# Patient Record
Sex: Female | Born: 1945 | Race: White | Hispanic: No | Smoking: Never smoker
Health system: Southern US, Community
[De-identification: ages and names within clinical notes are randomized; demographics above are authoritative.]

## PROBLEM LIST (undated history)

## (undated) DIAGNOSIS — J45909 Unspecified asthma, uncomplicated: Secondary | ICD-10-CM

## (undated) DIAGNOSIS — T7840XA Allergy, unspecified, initial encounter: Secondary | ICD-10-CM

## (undated) DIAGNOSIS — K219 Gastro-esophageal reflux disease without esophagitis: Secondary | ICD-10-CM

## (undated) DIAGNOSIS — H409 Unspecified glaucoma: Secondary | ICD-10-CM

## (undated) DIAGNOSIS — M069 Rheumatoid arthritis, unspecified: Secondary | ICD-10-CM

## (undated) DIAGNOSIS — F319 Bipolar disorder, unspecified: Secondary | ICD-10-CM

## (undated) DIAGNOSIS — K5792 Diverticulitis of intestine, part unspecified, without perforation or abscess without bleeding: Secondary | ICD-10-CM

## (undated) DIAGNOSIS — F99 Mental disorder, not otherwise specified: Secondary | ICD-10-CM

## (undated) DIAGNOSIS — E785 Hyperlipidemia, unspecified: Secondary | ICD-10-CM

## (undated) DIAGNOSIS — I10 Essential (primary) hypertension: Secondary | ICD-10-CM

## (undated) DIAGNOSIS — M8580 Other specified disorders of bone density and structure, unspecified site: Secondary | ICD-10-CM

## (undated) DIAGNOSIS — T884XXA Failed or difficult intubation, initial encounter: Secondary | ICD-10-CM

## (undated) DIAGNOSIS — G8929 Other chronic pain: Secondary | ICD-10-CM

## (undated) DIAGNOSIS — M549 Dorsalgia, unspecified: Secondary | ICD-10-CM

## (undated) HISTORY — DX: Allergy, unspecified, initial encounter: T78.40XA

## (undated) HISTORY — DX: Dorsalgia, unspecified: M54.9

## (undated) HISTORY — DX: Mental disorder, not otherwise specified: F99

## (undated) HISTORY — PX: HAND SURGERY: SHX662

## (undated) HISTORY — DX: Other chronic pain: G89.29

## (undated) HISTORY — PX: CHOLECYSTECTOMY: SHX55

## (undated) HISTORY — DX: Other specified disorders of bone density and structure, unspecified site: M85.80

## (undated) HISTORY — DX: Hyperlipidemia, unspecified: E78.5

## (undated) HISTORY — PX: COLONOSCOPY: SHX174

## (undated) HISTORY — DX: Rheumatoid arthritis, unspecified: M06.9

## (undated) HISTORY — PX: APPENDECTOMY: SHX54

## (undated) HISTORY — DX: Diverticulitis of intestine, part unspecified, without perforation or abscess without bleeding: K57.92

## (undated) HISTORY — DX: Bipolar disorder, unspecified: F31.9

## (undated) HISTORY — DX: Unspecified glaucoma: H40.9

## (undated) HISTORY — PX: BREAST CYST ASPIRATION: SHX578

---

## 1986-09-11 HISTORY — PX: BACK SURGERY: SHX140

## 1998-03-22 ENCOUNTER — Encounter: Payer: Self-pay | Admitting: Internal Medicine

## 2001-10-07 ENCOUNTER — Other Ambulatory Visit: Admission: RE | Admit: 2001-10-07 | Discharge: 2001-10-07 | Payer: Self-pay | Admitting: Internal Medicine

## 2001-10-07 ENCOUNTER — Encounter: Payer: Self-pay | Admitting: Internal Medicine

## 2001-10-07 LAB — CONVERTED CEMR LAB: Pap Smear: NORMAL

## 2003-12-24 ENCOUNTER — Encounter: Payer: Self-pay | Admitting: Internal Medicine

## 2003-12-24 ENCOUNTER — Other Ambulatory Visit: Admission: RE | Admit: 2003-12-24 | Discharge: 2003-12-24 | Payer: Self-pay | Admitting: Internal Medicine

## 2003-12-24 LAB — CONVERTED CEMR LAB: Pap Smear: NORMAL

## 2004-06-11 ENCOUNTER — Ambulatory Visit: Payer: Self-pay | Admitting: Internal Medicine

## 2004-07-12 ENCOUNTER — Ambulatory Visit: Payer: Self-pay | Admitting: Internal Medicine

## 2004-08-11 ENCOUNTER — Ambulatory Visit: Payer: Self-pay | Admitting: Internal Medicine

## 2004-08-24 ENCOUNTER — Ambulatory Visit: Payer: Self-pay | Admitting: Internal Medicine

## 2005-02-13 ENCOUNTER — Encounter: Admission: RE | Admit: 2005-02-13 | Discharge: 2005-02-13 | Payer: Self-pay | Admitting: Neurological Surgery

## 2005-03-03 ENCOUNTER — Encounter: Payer: Self-pay | Admitting: Internal Medicine

## 2005-03-17 ENCOUNTER — Ambulatory Visit: Payer: Self-pay | Admitting: Internal Medicine

## 2005-04-18 ENCOUNTER — Ambulatory Visit: Payer: Self-pay | Admitting: Internal Medicine

## 2005-07-20 ENCOUNTER — Ambulatory Visit: Payer: Self-pay | Admitting: Internal Medicine

## 2005-10-16 ENCOUNTER — Ambulatory Visit: Payer: Self-pay | Admitting: Internal Medicine

## 2005-11-21 ENCOUNTER — Ambulatory Visit: Payer: Self-pay | Admitting: Internal Medicine

## 2006-04-06 ENCOUNTER — Ambulatory Visit: Payer: Self-pay | Admitting: Internal Medicine

## 2006-08-09 ENCOUNTER — Ambulatory Visit: Payer: Self-pay | Admitting: Internal Medicine

## 2007-01-08 ENCOUNTER — Encounter: Payer: Self-pay | Admitting: Internal Medicine

## 2007-01-08 DIAGNOSIS — M549 Dorsalgia, unspecified: Secondary | ICD-10-CM

## 2007-01-08 DIAGNOSIS — G8929 Other chronic pain: Secondary | ICD-10-CM

## 2007-01-08 DIAGNOSIS — K5730 Diverticulosis of large intestine without perforation or abscess without bleeding: Secondary | ICD-10-CM | POA: Insufficient documentation

## 2007-01-22 ENCOUNTER — Ambulatory Visit: Payer: Self-pay | Admitting: Internal Medicine

## 2007-01-22 DIAGNOSIS — R030 Elevated blood-pressure reading, without diagnosis of hypertension: Secondary | ICD-10-CM

## 2007-01-22 DIAGNOSIS — E785 Hyperlipidemia, unspecified: Secondary | ICD-10-CM | POA: Insufficient documentation

## 2007-01-23 LAB — CONVERTED CEMR LAB
AST: 18 units/L (ref 0–37)
BUN: 18 mg/dL (ref 6–23)
Basophils Absolute: 0 10*3/uL (ref 0.0–0.1)
Basophils Relative: 0.1 % (ref 0.0–1.0)
Bilirubin, Direct: 0.1 mg/dL (ref 0.0–0.3)
Chloride: 104 meq/L (ref 96–112)
Creatinine, Ser: 0.5 mg/dL (ref 0.4–1.2)
Creatinine,U: 63.5 mg/dL
Eosinophils Relative: 3.2 % (ref 0.0–5.0)
GFR calc Af Amer: 161 mL/min
GFR calc non Af Amer: 133 mL/min
Glucose, Bld: 116 mg/dL — ABNORMAL HIGH (ref 70–99)
HCT: 36 % (ref 36.0–46.0)
Hgb A1c MFr Bld: 5.7 % (ref 4.6–6.0)
Microalb Creat Ratio: 18.9 mg/g (ref 0.0–30.0)
Microalb, Ur: 1.2 mg/dL (ref 0.0–1.9)
Monocytes Absolute: 0.3 10*3/uL (ref 0.2–0.7)
Neutro Abs: 2.9 10*3/uL (ref 1.4–7.7)
Neutrophils Relative %: 56.1 % (ref 43.0–77.0)
Phosphorus: 3.9 mg/dL (ref 2.3–4.6)
Platelets: 341 10*3/uL (ref 150–400)
Potassium: 4.3 meq/L (ref 3.5–5.1)
TSH: 2.2 microintl units/mL (ref 0.35–5.50)

## 2007-02-08 ENCOUNTER — Encounter: Payer: Self-pay | Admitting: Internal Medicine

## 2007-02-08 ENCOUNTER — Ambulatory Visit: Payer: Self-pay | Admitting: Gastroenterology

## 2007-02-08 LAB — HM COLONOSCOPY

## 2007-02-15 ENCOUNTER — Encounter: Payer: Self-pay | Admitting: Internal Medicine

## 2007-05-14 ENCOUNTER — Telehealth (INDEPENDENT_AMBULATORY_CARE_PROVIDER_SITE_OTHER): Payer: Self-pay | Admitting: *Deleted

## 2007-05-17 ENCOUNTER — Encounter: Payer: Self-pay | Admitting: Internal Medicine

## 2007-05-17 ENCOUNTER — Ambulatory Visit: Payer: Self-pay | Admitting: Unknown Physician Specialty

## 2007-05-27 ENCOUNTER — Encounter: Payer: Self-pay | Admitting: Internal Medicine

## 2007-06-19 ENCOUNTER — Telehealth (INDEPENDENT_AMBULATORY_CARE_PROVIDER_SITE_OTHER): Payer: Self-pay | Admitting: *Deleted

## 2007-07-26 ENCOUNTER — Ambulatory Visit: Payer: Self-pay | Admitting: Internal Medicine

## 2007-07-26 DIAGNOSIS — M949 Disorder of cartilage, unspecified: Secondary | ICD-10-CM

## 2007-07-26 DIAGNOSIS — M899 Disorder of bone, unspecified: Secondary | ICD-10-CM | POA: Insufficient documentation

## 2007-07-29 LAB — CONVERTED CEMR LAB
ALT: 15 units/L (ref 0–35)
CO2: 30 meq/L (ref 19–32)
Cholesterol: 207 mg/dL (ref 0–200)
GFR calc Af Amer: 130 mL/min
GFR calc non Af Amer: 108 mL/min
Glucose, Bld: 102 mg/dL — ABNORMAL HIGH (ref 70–99)
HDL: 41 mg/dL (ref >39.0)
Hgb A1c MFr Bld: 5.9 % (ref 4.6–6.0)
Total CHOL/HDL Ratio: 5
Triglycerides: 125 mg/dL (ref 0–149)

## 2007-09-19 ENCOUNTER — Ambulatory Visit: Payer: Self-pay | Admitting: Internal Medicine

## 2007-09-19 LAB — CONVERTED CEMR LAB
Bilirubin Urine: NEGATIVE
Glucose, Urine, Semiquant: NEGATIVE
Ketones, urine, test strip: NEGATIVE
Nitrite: NEGATIVE
Specific Gravity, Urine: <1.005
Urobilinogen, UA: 0.2
pH: 8.5

## 2007-10-29 ENCOUNTER — Encounter: Payer: Self-pay | Admitting: Internal Medicine

## 2007-10-29 ENCOUNTER — Telehealth (INDEPENDENT_AMBULATORY_CARE_PROVIDER_SITE_OTHER): Payer: Self-pay | Admitting: *Deleted

## 2007-10-31 ENCOUNTER — Encounter: Payer: Self-pay | Admitting: Internal Medicine

## 2007-12-10 ENCOUNTER — Ambulatory Visit: Payer: Self-pay | Admitting: Internal Medicine

## 2008-03-24 ENCOUNTER — Ambulatory Visit: Payer: Self-pay | Admitting: Internal Medicine

## 2008-03-25 ENCOUNTER — Encounter: Payer: Self-pay | Admitting: Internal Medicine

## 2008-03-25 ENCOUNTER — Telehealth (INDEPENDENT_AMBULATORY_CARE_PROVIDER_SITE_OTHER): Payer: Self-pay | Admitting: *Deleted

## 2008-03-25 LAB — CONVERTED CEMR LAB
Albumin: 3.9 g/dL (ref 3.5–5.2)
BUN: 20 mg/dL (ref 6–23)
Bilirubin, Direct: 0.1 mg/dL (ref 0.0–0.3)
CO2: 29 meq/L (ref 19–32)
Cholesterol: 202 mg/dL (ref 0–200)
Creatinine, Ser: 0.6 mg/dL (ref 0.4–1.2)
Creatinine,U: 221.9 mg/dL
Direct LDL: 129.4 mg/dL
Eosinophils Relative: 1.5 % (ref 0.0–5.0)
GFR calc non Af Amer: 107 mL/min
Glucose, Bld: 129 mg/dL — ABNORMAL HIGH (ref 70–99)
HDL: 37.2 mg/dL — ABNORMAL LOW (ref >39.0)
Hemoglobin: 13.2 g/dL (ref 12.0–15.0)
MCHC: 34.9 g/dL (ref 30.0–36.0)
Monocytes Relative: 4.4 % (ref 3.0–12.0)
Phosphorus: 3.9 mg/dL (ref 2.3–4.6)
Potassium: 4.1 meq/L (ref 3.5–5.1)
RBC: 4.18 M/uL (ref 3.87–5.11)
Sodium: 142 meq/L (ref 135–145)
TSH: 2.02 microintl units/mL (ref 0.35–5.50)
Total Bilirubin: 0.5 mg/dL (ref 0.3–1.2)
Total CHOL/HDL Ratio: 5.4
Total Protein: 7.9 g/dL (ref 6.0–8.3)
VLDL: 52 mg/dL — ABNORMAL HIGH (ref 0–40)
WBC: 8.6 10*3/uL (ref 4.5–10.5)

## 2008-08-06 ENCOUNTER — Emergency Department: Payer: Self-pay | Admitting: Emergency Medicine

## 2008-10-01 ENCOUNTER — Ambulatory Visit: Payer: Self-pay | Admitting: Internal Medicine

## 2008-10-05 LAB — CONVERTED CEMR LAB
BUN: 18 mg/dL (ref 6–23)
Basophils Relative: 1.3 % (ref 0.0–3.0)
CO2: 28 meq/L (ref 19–32)
Calcium: 9.8 mg/dL (ref 8.4–10.5)
Chloride: 106 meq/L (ref 96–112)
Creatinine, Ser: 0.6 mg/dL (ref 0.4–1.2)
GFR calc Af Amer: 130 mL/min
GFR calc non Af Amer: 107 mL/min
Glucose, Bld: 109 mg/dL — ABNORMAL HIGH (ref 70–99)
HCT: 36.5 % (ref 36.0–46.0)
Hgb A1c MFr Bld: 6.3 % — ABNORMAL HIGH (ref 4.6–6.0)
MCV: 91 fL (ref 78.0–100.0)
Neutrophils Relative %: 60.1 % (ref 43.0–77.0)
Platelets: 277 10*3/uL (ref 150–400)
WBC: 7.9 10*3/uL (ref 4.5–10.5)

## 2008-10-13 ENCOUNTER — Telehealth: Payer: Self-pay | Admitting: Internal Medicine

## 2008-12-23 ENCOUNTER — Ambulatory Visit: Payer: Self-pay | Admitting: Internal Medicine

## 2008-12-24 ENCOUNTER — Encounter: Payer: Self-pay | Admitting: Internal Medicine

## 2008-12-29 ENCOUNTER — Encounter: Payer: Self-pay | Admitting: Internal Medicine

## 2009-03-31 ENCOUNTER — Ambulatory Visit: Payer: Self-pay | Admitting: Internal Medicine

## 2009-04-01 LAB — CONVERTED CEMR LAB
AST: 18 units/L (ref 0–37)
Albumin: 4 g/dL (ref 3.5–5.2)
Alkaline Phosphatase: 82 units/L (ref 39–117)
BUN: 14 mg/dL (ref 6–23)
Bilirubin, Direct: 0 mg/dL (ref 0.0–0.3)
Calcium: 9.6 mg/dL (ref 8.4–10.5)
Chloride: 105 meq/L (ref 96–112)
Creatinine, Ser: 0.6 mg/dL (ref 0.4–1.2)
Glucose, Bld: 111 mg/dL — ABNORMAL HIGH (ref 70–99)
HCT: 38.3 % (ref 36.0–46.0)
MCHC: 34 g/dL (ref 30.0–36.0)
Neutrophils Relative %: 62.9 % (ref 43.0–77.0)
Phosphorus: 3.8 mg/dL (ref 2.3–4.6)
Platelets: 325 10*3/uL (ref 150.0–400.0)
Potassium: 4.8 meq/L (ref 3.5–5.1)
RBC: 4.12 M/uL (ref 3.87–5.11)
Sodium: 142 meq/L (ref 135–145)
TSH: 1.37 microintl units/mL (ref 0.35–5.50)
Total Protein: 7.7 g/dL (ref 6.0–8.3)
WBC: 6.9 10*3/uL (ref 4.5–10.5)

## 2009-04-05 LAB — CONVERTED CEMR LAB: Creatinine,U: 214.4 mg/dL

## 2009-05-03 ENCOUNTER — Ambulatory Visit: Payer: Self-pay | Admitting: Internal Medicine

## 2009-05-04 LAB — CONVERTED CEMR LAB
Alkaline Phosphatase: 75 units/L (ref 39–117)
Cholesterol: 150 mg/dL (ref 0–200)
HDL: 41 mg/dL (ref >39.00)
Total Bilirubin: 0.6 mg/dL (ref 0.3–1.2)
Total CHOL/HDL Ratio: 4
Triglycerides: 122 mg/dL (ref 0.0–149.0)

## 2009-06-07 ENCOUNTER — Ambulatory Visit: Payer: Self-pay | Admitting: Internal Medicine

## 2009-06-07 DIAGNOSIS — R413 Other amnesia: Secondary | ICD-10-CM

## 2009-06-10 LAB — CONVERTED CEMR LAB
AST: 20 units/L (ref 0–37)
Albumin: 4 g/dL (ref 3.5–5.2)
BUN: 17 mg/dL (ref 6–23)
Basophils Relative: 0.9 % (ref 0.0–3.0)
CO2: 30 meq/L (ref 19–32)
Chloride: 107 meq/L (ref 96–112)
Eosinophils Relative: 4.2 % (ref 0.0–5.0)
Hemoglobin: 12.8 g/dL (ref 12.0–15.0)
Lymphs Abs: 2.1 10*3/uL (ref 0.7–4.0)
MCHC: 34.2 g/dL (ref 30.0–36.0)
MCV: 94.5 fL (ref 78.0–100.0)
Monocytes Absolute: 0.2 10*3/uL (ref 0.1–1.0)
Neutro Abs: 4.4 10*3/uL (ref 1.4–7.7)
Neutrophils Relative %: 62.2 % (ref 43.0–77.0)
Potassium: 4.8 meq/L (ref 3.5–5.1)
RBC: 3.97 M/uL (ref 3.87–5.11)
RDW: 12.7 % (ref 11.5–14.6)
Sodium: 140 meq/L (ref 135–145)
Total Bilirubin: 0.3 mg/dL (ref 0.3–1.2)

## 2009-07-01 ENCOUNTER — Ambulatory Visit: Payer: Self-pay | Admitting: Internal Medicine

## 2009-07-05 LAB — CONVERTED CEMR LAB: Vitamin B-12: >1500 pg/mL — ABNORMAL HIGH (ref 211–911)

## 2009-07-07 ENCOUNTER — Ambulatory Visit: Payer: Self-pay | Admitting: Internal Medicine

## 2009-10-01 ENCOUNTER — Ambulatory Visit: Payer: Self-pay | Admitting: Internal Medicine

## 2009-10-01 DIAGNOSIS — F3174 Bipolar disorder, in full remission, most recent episode manic: Secondary | ICD-10-CM

## 2009-10-04 LAB — CONVERTED CEMR LAB
Albumin: 3.7 g/dL (ref 3.5–5.2)
Alkaline Phosphatase: 87 units/L (ref 39–117)
BUN: 15 mg/dL (ref 6–23)
Bilirubin, Direct: 0 mg/dL (ref 0.0–0.3)
CO2: 30 meq/L (ref 19–32)
Creatinine, Ser: 0.6 mg/dL (ref 0.4–1.2)
Eosinophils Absolute: 0.1 10*3/uL (ref 0.0–0.7)
GFR calc non Af Amer: 106.77 mL/min (ref >60)
HCT: 34.5 % — ABNORMAL LOW (ref 36.0–46.0)
Hemoglobin: 11.4 g/dL — ABNORMAL LOW (ref 12.0–15.0)
Monocytes Absolute: 0.3 10*3/uL (ref 0.1–1.0)
Phosphorus: 4.1 mg/dL (ref 2.3–4.6)
Platelets: 321 10*3/uL (ref 150.0–400.0)
Potassium: 4.4 meq/L (ref 3.5–5.1)
RBC: 3.6 M/uL — ABNORMAL LOW (ref 3.87–5.11)
RDW: 12.8 % (ref 11.5–14.6)
Total Bilirubin: 0.6 mg/dL (ref 0.3–1.2)
Total Protein: 7.1 g/dL (ref 6.0–8.3)
Vitamin B-12: >1500 pg/mL — ABNORMAL HIGH (ref 211–911)
WBC: 6.4 10*3/uL (ref 4.5–10.5)

## 2010-03-15 ENCOUNTER — Ambulatory Visit: Payer: Self-pay | Admitting: Family Medicine

## 2010-03-15 DIAGNOSIS — B029 Zoster without complications: Secondary | ICD-10-CM | POA: Insufficient documentation

## 2010-04-05 ENCOUNTER — Ambulatory Visit: Payer: Self-pay | Admitting: Internal Medicine

## 2010-04-07 LAB — CONVERTED CEMR LAB
ALT: 12 units/L (ref 0–35)
AST: 15 units/L (ref 0–37)
Albumin: 4 g/dL (ref 3.5–5.2)
Alkaline Phosphatase: 82 units/L (ref 39–117)
BUN: 19 mg/dL (ref 6–23)
Basophils Absolute: 0 10*3/uL (ref 0.0–0.1)
Basophils Relative: 0.4 % (ref 0.0–3.0)
CO2: 29 meq/L (ref 19–32)
Creatinine,U: 83.5 mg/dL
Direct LDL: 127.5 mg/dL
Eosinophils Relative: 3.5 % (ref 0.0–5.0)
Hemoglobin: 11.9 g/dL — ABNORMAL LOW (ref 12.0–15.0)
Lymphocytes Relative: 30.8 % (ref 12.0–46.0)
MCHC: 34.3 g/dL (ref 30.0–36.0)
Microalb Creat Ratio: 3.2 mg/g (ref 0.0–30.0)
Potassium: 4.5 meq/L (ref 3.5–5.1)
Sodium: 141 meq/L (ref 135–145)
TSH: 2.09 microintl units/mL (ref 0.35–5.50)
Total CHOL/HDL Ratio: 4
VLDL: 32.2 mg/dL (ref 0.0–40.0)

## 2010-10-11 NOTE — Assessment & Plan Note (Signed)
Summary: RASH/DLO   Vital Signs:  Patient profile:   65 year old female Height:      63 inches Weight:      131.0 pounds BMI:     23.29 Temp:     99.9 degrees F oral Pulse rate:   88 / minute Pulse rhythm:   regular BP sitting:   102 / 68  (left arm) Cuff size:   regular  Vitals Entered By: Benny Lennert CMA Duncan Dull) (March 15, 2010 3:35 PM)  History of Present Illness: Chief complaint rash    6 days ago... Noted itching in righ lower back.. then pain began. Tried cortisone cream, bath powder, neosporim. Area spreading locally on right side. Very sensitive to touch..trouble sleeping du to pain.  No sick contacts, no fever, no sick feeling, no new meds.   No OTC meds for pain.   Problems Prior to Update: 1)  Bipolar Disorder Unspecified  (ICD-296.80) 2)  Memory Loss  (ICD-780.93) 3)  Other Screening Mammogram  (ICD-V76.12) 4)  Osteopenia  (ICD-733.90) 5)  Elevated Blood Pressure Without Diagnosis of Hypertension  (ICD-796.2) 6)  Hyperlipidemia  (ICD-272.4) 7)  Back Pain, Chronic  (ICD-724.5) 8)  Personality Disorder (BIPOLAR?)  (ICD-301.9) 9)  Diverticulosis, Colon  (ICD-562.10) 10)  Diabetes Mellitus, Type II  (ICD-250.00)  Current Medications (verified): 1)  Carisoprodol 350 Mg Tabs (Carisoprodol) .... Take 1 Tablet By Mouth Three Times A Day As Needed For Muscle Spasm 2)  Trileptal 300 Mg Tabs (Oxcarbazepine) .... Take 2 By Mouth in The Am and 1 By Mouth in The Pm 3)  Metformin Hcl 500 Mg Tabs (Metformin Hcl) .... Take One By Mouth Two Times A Day 4)  Aspirin 81 Mg Tabs (Aspirin) .... Take 1 By Mouth Once Daily 5)  Calcium 500 Mg Tabs (Calcium Carbonate) .... Take 1 By Mouth Twice Daily 6)  Geodon 80 Mg Caps (Ziprasidone Hcl) .... Take 3 By Mouth Once Daily 7)  Vitamin B-12 1000 Mcg Tabs (Cyanocobalamin) .... Take 1 By Mouth Once Daily 8)  Seroquel 50 Mg Tabs (Quetiapine Fumarate) .... As Needed Per Patient 9)  Acyclovir 800 Mg Tabs (Acyclovir) .Marland Kitchen.. 1 Tab By Mouth  Five Times A Day X 7 Days 10)  Tramadol Hcl 50 Mg Tabs (Tramadol Hcl) .Marland Kitchen.. 1 Tab By Mouth At Bedtime As Needed Pain  Allergies: 1)  ! Codeine Sulfate (Codeine Sulfate) 2)  ! Zoloft (Sertraline Hcl)  Past History:  Past medical, surgical, family and social histories (including risk factors) reviewed for relevance to current acute and chronic problems.  Past Medical History: Reviewed history from 06/07/2009 and no changes required. Diabetes mellitus, type II Diverticulosis, colon Hyperlipidemia Chronic back pain Bipolar-----------------------------------------------------Dr Mickiewicz Osteopenia  Past Surgical History: Reviewed history from 01/08/2007 and no changes required. 1988  Back surgery Cholecystectomy--1970 Admitted for mental health issues x 3  Family History: Reviewed history from 03/31/2009 and no changes required. Dad died @69 ---MVA, had CVA young Mom has DM, HTN No hx of cancer Sister had CVA Daughter has Hashimoto's  Social History: Reviewed history from 01/08/2007 and no changes required. 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 CV:  Denies chest pain or discomfort. Resp:  Denies shortness of breath.  Physical Exam  General:  Well-developed,well-nourished,in no acute distress; alert,appropriate and cooperative throughout examination Mouth:  Oral mucosa and oropharynx without lesions or exudates.  Teeth in good repair. Neck:  no  carotid bruit or thyromegaly no cervical or supraclavicular lymphadenopathy  Lungs:  Normal respiratory effort, chest expands symmetrically. Lungs are clear to auscultation, no crackles or wheezes. Heart:  Normal rate and regular rhythm. S1 and S2 normal without gallop, murmur, click, rub or other extra sounds. Skin:  blisters on red background in dermatomal distribution right lower back   Impression  & Recommendations:  Problem # 1:  HERPES ZOSTER (ICD-053.9) Uncelar day of rash appearance given location.Marland Kitchen3-4 days ago...will treat with anitviral. Emphasized pain managment and control to avoid pain syndrome. Treat with tramadola nd ibuprofen as needed. Call if not controlled.  Avoid 62 week old grandchild until rash scabs over.   Complete Medication List: 1)  Carisoprodol 350 Mg Tabs (Carisoprodol) .... Take 1 tablet by mouth three times a day as needed for muscle spasm 2)  Trileptal 300 Mg Tabs (Oxcarbazepine) .... Take 2 by mouth in the am and 1 by mouth in the pm 3)  Metformin Hcl 500 Mg Tabs (Metformin hcl) .... Take one by mouth two times a day 4)  Aspirin 81 Mg Tabs (Aspirin) .... Take 1 by mouth once daily 5)  Calcium 500 Mg Tabs (Calcium carbonate) .... Take 1 by mouth twice daily 6)  Geodon 80 Mg Caps (Ziprasidone hcl) .... Take 3 by mouth once daily 7)  Vitamin B-12 1000 Mcg Tabs (Cyanocobalamin) .... Take 1 by mouth once daily 8)  Seroquel 50 Mg Tabs (Quetiapine fumarate) .... As needed per patient 9)  Acyclovir 800 Mg Tabs (Acyclovir) .Marland Kitchen.. 1 tab by mouth five times a day x 7 days 10)  Tramadol Hcl 50 Mg Tabs (Tramadol hcl) .Marland Kitchen.. 1 tab by mouth at bedtime as needed pain  Patient Instructions: 1)  Tramadol at bedtime for pain. Ibuprofen 800 mg three times a day for pain during the day... stop if stomach irritation. 2)   Call if pain not well controlled on theses meds. 3)  Avoid pregnant, immunocompromised or infants until rash scabs over. Prescriptions: TRAMADOL HCL 50 MG TABS (TRAMADOL HCL) 1 tab by mouth at bedtime as needed pain  #20 x 0   Entered and Authorized by:   Kerby Nora MD   Signed by:   Kerby Nora MD on 03/15/2010   Method used:   Print then Give to Patient   RxID:   [id number] ACYCLOVIR 800 MG TABS (ACYCLOVIR) 1 tab by mouth five times a day x 7 days  #35 x 0   Entered and Authorized by:   Kerby Nora MD   Signed by:   Kerby Nora MD on 03/15/2010    Method used:   Print then Give to Patient   RxID:   [id number]   Current Allergies (reviewed today): ! CODEINE SULFATE (CODEINE SULFATE) ! ZOLOFT (SERTRALINE HCL)

## 2010-10-11 NOTE — Assessment & Plan Note (Signed)
Summary: CPX/RBH   Vital Signs:  Patient profile:   65 year old female Weight:      131.13 pounds BMI:     23.31 Temp:     98.1 degrees F oral Pulse rate:   64 / minute Pulse rhythm:   regular BP sitting:   124 / 82  (left arm) Cuff size:   regular  Vitals Entered By: Sydell Axon LPN (April 05, 2010 9:20 AM) CC: 30 Minute checkup  Vision Screening:Left eye with correction: 20 / 20 Right eye with correction: 20 / 40 Both eyes with correction: 20 / 20        Vision Entered By: Sydell Axon LPN (April 05, 2010 10:43 AM)  20db HL: Left  500 hz: 25db 1000 hz: 25db 2000 hz: 25db 4000 hz: 25db Right  500 hz: 25db 1000 hz: 25db 2000 hz: 25db 4000 hz: 25db    History of Present Illness: Here for Medicare AWV:  1.   Risk factors based on Past M, S, F history:                Mood disorder now controlled. Had some issues with memory but this is improved  2.   Physical Activities:                 Walks regularly. Weight stable  3.   Depression/mod:                  Chronic mood issues. Sees psychiatrist for bipolar. Controlled  4.   Hearing:                 has been fine  5.   ADL's:                Completely independent. Drives but occ avoids if meds affect her some   6.   Fall Risk:                  Low  7.   Home Safety:                  Has grab bars in bathroom, some rugs on carpeting--advised to remove  8.   Height, weight, &visual acuity:                all appropriate 9.   Counseling:                            Compliant with diabetic diet. Exercises.  10.   Labs ordered based on risk factors:                   DM and chol follow up. Will check TSH 11.           Referral Coordination                 Already sees psychiatrist. No other referrals necessary  12.           Care Plan                    Mammo due 4/12. DEXA not indicated for at least 5 years (since borderline osteopenia) 13.            Cognitive Assessment                  Has had memory  issues likely related to mood. No sig issues now  Preventive Screening-Counseling & Management  Alcohol-Tobacco     Smoking Status: never  Caffeine-Diet-Exercise     Does Patient Exercise: yes     Type of exercise: walking     Exercise (avg: min/session): 30-60     Times/week: 2-3 times per week     Exercise Counseling: not indicated; exercise is adequate  Allergies: 1)  ! Codeine Sulfate (Codeine Sulfate) 2)  ! Zoloft (Sertraline Hcl)  Past History:  Past medical, surgical, family and social histories (including risk factors) reviewed for relevance to current acute and chronic problems.  Past Medical History: Reviewed history from 06/07/2009 and no changes required. Diabetes mellitus, type II Diverticulosis, colon Hyperlipidemia Chronic back pain Bipolar-----------------------------------------------------Dr Mickiewicz Osteopenia  Past Surgical History: Reviewed history from 01/08/2007 and no changes required. 1988  Back surgery Cholecystectomy--1970 Admitted for mental health issues x 3  Family History: Reviewed history from 03/31/2009 and no changes required. Dad died @69 ---MVA, had CVA young Mom has DM, HTN No hx of cancer Sister had CVA Daughter has Hashimoto's  Social History: Reviewed history from 01/08/2007 and no changes required. 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         Got over the shingles---discussed getting the vaccine some time in the future to maximize benefit Checks sugars every couple of weeks---all <110 No numbness or pain in feet uses carisprodol only occ for back Has been taking gabapentin for back again--has left over Rx and needs new one. Definitely helps back   Physical Exam  General:  alert and normal appearance.     Neck:  supple, no masses, no thyromegaly, no carotid bruits, and no cervical lymphadenopathy.   Lungs:  normal respiratory effort, no intercostal retractions, no accessory muscle use, and normal breath sounds.   Heart:  normal rate, regular rhythm, no murmur, and no gallop.   Abdomen:  soft and non-tender.   Msk:  no joint tenderness and no joint swelling.   Pulses:  2+ in feet Extremities:  no edema Neurologic:  alert & oriented X3, strength normal in all extremities, and gait normal.   Skin:  no suspicious lesions and no ulcerations.   Psych:  normally interactive and good eye contact.    Diabetes Management Exam:    Foot Exam (with socks and/or shoes not present):       Sensory-Pinprick/Light touch:          Left medial foot (L-4): normal          Left dorsal foot (L-5): normal          Left lateral foot (S-1): normal          Right medial foot (L-4): normal          Right dorsal foot (L-5): normal          Right lateral foot (S-1): normal       Inspection:          Left foot: normal          Right foot: normal       Nails:          Left foot: normal          Right foot: normal    Eye Exam:       Eye Exam done elsewhere  Date: 11/09/2009          Results: early retinopathy          Done by: [facility]   Impression & Recommendations:  Problem # 1:  PREVENTIVE HEALTH CARE (ICD-V70.0) Assessment Comment Only due for imms mammo next year DEXA 2013 Prefers no paps now that over 65  Problem # 2:  DIABETES MELLITUS, TYPE II (ICD-250.00) Assessment: Unchanged  well controlled will check labs Her updated medication list for this problem includes:    Metformin Hcl 500 Mg Tabs (Metformin hcl) .Marland Kitchen... Take one by mouth two times a day    Aspirin 81 Mg Tabs (Aspirin) .Marland Kitchen... Take 1 by mouth once daily  Labs Reviewed: Creat: 0.6 (10/01/2009)     Last Eye Exam: early retinopathy (11/09/2009) Reviewed HgBA1c results: 5.9 (10/01/2009)  5.7  (03/31/2009)  Orders: Venipuncture (11914) TLB-Renal Function Panel (80069-RENAL) TLB-CBC Platelet - w/Differential (85025-CBCD) TLB-TSH (Thyroid Stimulating Hormone) (84443-TSH) TLB-Microalbumin/Creat Ratio, Urine (82043-MALB) TLB-A1C / Hgb A1C (Glycohemoglobin) (83036-A1C)  Problem # 3:  BACK PAIN, CHRONIC (ICD-724.5) Assessment: Unchanged needs to be back on her gabapentin as that gaveher better pain relief  The following medications were removed from the medication list:    Tramadol Hcl 50 Mg Tabs (Tramadol hcl) .Marland Kitchen... 1 tab by mouth at bedtime as needed pain Her updated medication list for this problem includes:    Carisoprodol 350 Mg Tabs (Carisoprodol) .Marland Kitchen... Take 1 tablet by mouth three times a day as needed for muscle spasm    Aspirin 81 Mg Tabs (Aspirin) .Marland Kitchen... Take 1 by mouth once daily  Problem # 4:  BIPOLAR DISORDER UNSPECIFIED (ICD-296.80) Assessment: Unchanged follows with psychiatrist still  Problem # 5:  ELEVATED BLOOD PRESSURE WITHOUT DIAGNOSIS OF HYPERTENSION (ICD-796.2) Assessment: Improved BP has been fine for some time will check urine microal also  BP today: 124/82 Prior BP: 102/68 (03/15/2010)  Labs Reviewed: Creat: 0.6 (10/01/2009) Chol: 150 (05/03/2009)   HDL: 41.00 (05/03/2009)   LDL: 85 (05/03/2009)   TG: 122.0 (05/03/2009)  Instructed in low sodium diet (DASH Handout) and behavior modification.    Problem # 6:  HYPERLIPIDEMIA (ICD-272.4) Assessment: Unchanged  okay without meds will recheck  Labs Reviewed: SGOT: 18 (10/01/2009)   SGPT: 15 (10/01/2009)   HDL:41.00 (05/03/2009), 44.80 (03/31/2009)  LDL:85 (05/03/2009), DEL (78/29/5621)  Chol:150 (05/03/2009), 205 (03/31/2009)  Trig:122.0 (05/03/2009), 147.0 (03/31/2009)  Orders: TLB-Lipid Panel (80061-LIPID) TLB-Hepatic/Liver Function Pnl (80076-HEPATIC)  Complete Medication List: 1)  Carisoprodol 350 Mg Tabs (Carisoprodol) .... Take 1 tablet by mouth three times a day as needed for muscle  spasm 2)  Trileptal 300 Mg Tabs (Oxcarbazepine) .... Take 2 by mouth in the am and 1 by mouth in the pm 3)  Metformin Hcl 500 Mg Tabs (Metformin hcl) .... Take one by mouth two times a day 4)  Aspirin 81 Mg Tabs (Aspirin) .... Take 1 by mouth once daily 5)  Calcium 500 Mg Tabs (Calcium carbonate) .... Take 1 by mouth twice daily 6)  Geodon 80 Mg Caps (Ziprasidone hcl) .... Take 3 by mouth once daily 7)  Vitamin B-12 1000 Mcg Tabs (Cyanocobalamin) .... Take 1 by mouth once daily 8)  Seroquel 50 Mg Tabs (Quetiapine fumarate) .... As needed per patient 9)  Vitamin E 400 Unit Caps (Vitamin e) .... Take one by mouth daily 10)  Gabapentin 300 Mg Caps (Gabapentin) .Marland Kitchen.. 1 cap by mouth three times a day for nerve related back pain  Other Orders: Tdap => 75yrs IM (30865)  Pneumococcal Vaccine (16109) Admin 1st Vaccine (60454) Admin of Any Addtl Vaccine (09811)  Patient Instructions: 1)  Please schedule a follow-up appointment in 6 months .  Prescriptions: GABAPENTIN 300 MG CAPS (GABAPENTIN) 1 cap by mouth three times a day for nerve related back pain  #270 x 1   Entered and Authorized by:   Cindee Salt MD   Signed by:   Cindee Salt MD on 04/05/2010   Method used:   Print then Give to Patient   RxID:   [id number] CARISOPRODOL 350 MG TABS (CARISOPRODOL) Take 1 tablet by mouth three times a day as needed for muscle spasm  #270 x 0   Entered and Authorized by:   Cindee Salt MD   Signed by:   Cindee Salt MD on 04/05/2010   Method used:   Print then Give to Patient   RxID:   [id number]   Current Allergies (reviewed today): ! CODEINE SULFATE (CODEINE SULFATE) ! ZOLOFT (SERTRALINE HCL)    Immunizations Administered:  Tetanus Vaccine:    Vaccine Type: Tdap    Site: left deltoid    Mfr: GlaxoSmithKline    Dose: 0.5 ml    Route: IM    Given by: Sydell Axon LPN    Exp. Date: 12/04/2011    Lot #: [id number]    VIS given: 07/30/07 version given April 05, 2010.  Pneumonia Vaccine:    Vaccine Type: Pneumovax (Medicare)    Site: right deltoid    Mfr: Merck    Dose: 0.5 ml    Route: IM    Given by: Sydell Axon LPN    Exp. Date: 09/28/2011    Lot #: [id number]    VIS given: 04/08/96 version given April 05, 2010.   Prevention & Chronic Care Immunizations   Influenza vaccine: Fluvax 3+  (07/26/2007)    Tetanus booster: 04/05/2010: Tdap    Pneumococcal vaccine: Pneumovax (Medicare)  (04/05/2010)    H. zoster vaccine: Not documented   H. zoster vaccine deferral: Contraindicated  (04/05/2010)  Colorectal Screening   Hemoccult: Done  (10/12/2001)    Colonoscopy:  Results: Diverticulosis.         (02/08/2007)  Other Screening   Pap smear: Interpretation/Result:Negative for intraepithelial Lesion or Malignancy.   Dr Barnabas Lister Approximate date  (12/11/2007)    Mammogram: No specific mammographic evidence of malignancy.    (12/24/2008)    DXA bone density scan: Lumbar Spine:  T Score-2.5 to -1.0 Spine.   Hip Total: T Score -2.5 to -1.0 Hip.      (05/17/2007)   Smoking status: never  (04/05/2010)  Diabetes Mellitus   HgbA1C: 5.9  (10/01/2009)    Eye exam: early retinopathy  (11/09/2009)   Eye exam due: 11/2010    Foot exam: yes  (04/05/2010)   High risk foot: Not documented   Foot care education: Not documented    Urine microalbumin/creatinine ratio: 7.6  (10/01/2009)  Lipids   Total Cholesterol: 150  (05/03/2009)   LDL: 85  (05/03/2009)   LDL Direct: 141.0  (03/31/2009)   HDL: 41.00  (05/03/2009)   Triglycerides: 122.0  (05/03/2009)    SGOT (AST): 18  (10/01/2009)   SGPT (ALT): 15  (10/01/2009)   Alkaline phosphatase: 87  (10/01/2009)   Total bilirubin: 0.6  (10/01/2009)  Self-Management Support :    Diabetes self-management support: Not documented    Lipid self-management support: Not documented     Mammogram  Procedure date:  12/24/2008  Findings:  No specific mammographic evidence of malignancy.     Pap Smear  Procedure date:  12/11/2007  Findings:      Interpretation/Result:Negative for intraepithelial Lesion or Malignancy.   Dr Barnabas Lister Approximate date
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
## 2010-10-28 ENCOUNTER — Telehealth: Payer: Self-pay | Admitting: Family Medicine

## 2010-10-31 ENCOUNTER — Other Ambulatory Visit: Payer: Self-pay | Admitting: Internal Medicine

## 2010-10-31 ENCOUNTER — Encounter: Payer: Self-pay | Admitting: Internal Medicine

## 2010-10-31 ENCOUNTER — Ambulatory Visit (INDEPENDENT_AMBULATORY_CARE_PROVIDER_SITE_OTHER): Payer: Medicare Other | Admitting: Internal Medicine

## 2010-10-31 DIAGNOSIS — Z79899 Other long term (current) drug therapy: Secondary | ICD-10-CM

## 2010-10-31 DIAGNOSIS — E119 Type 2 diabetes mellitus without complications: Secondary | ICD-10-CM

## 2010-10-31 DIAGNOSIS — G459 Transient cerebral ischemic attack, unspecified: Secondary | ICD-10-CM | POA: Insufficient documentation

## 2010-10-31 LAB — RENAL FUNCTION PANEL
Albumin: 3.7 g/dL (ref 3.5–5.2)
Calcium: 10.1 mg/dL (ref 8.4–10.5)
Creatinine, Ser: 0.7 mg/dL (ref 0.4–1.2)
GFR: 83.54 mL/min (ref >60.00)
Potassium: 4.5 mEq/L (ref 3.5–5.1)
Sodium: 142 mEq/L (ref 135–145)

## 2010-10-31 LAB — CBC WITH DIFFERENTIAL/PLATELET
Basophils Relative: 0.2 % (ref 0.0–3.0)
Eosinophils Absolute: 0.2 10*3/uL (ref 0.0–0.7)
Eosinophils Relative: 2.3 % (ref 0.0–5.0)
HCT: 36.7 % (ref 36.0–46.0)
Lymphocytes Relative: 22.2 % (ref 12.0–46.0)
MCHC: 34.4 g/dL (ref 30.0–36.0)
MCV: 90.8 fl (ref 78.0–100.0)
Monocytes Absolute: 0.4 10*3/uL (ref 0.1–1.0)
Neutro Abs: 6.4 10*3/uL (ref 1.4–7.7)
RBC: 4.04 Mil/uL (ref 3.87–5.11)
RDW: 14.2 % (ref 11.5–14.6)
WBC: 9.1 10*3/uL (ref 4.5–10.5)

## 2010-10-31 LAB — VITAMIN B12: Vitamin B-12: >1500 pg/mL — ABNORMAL HIGH (ref 211–911)

## 2010-10-31 LAB — HEPATIC FUNCTION PANEL
ALT: 10 U/L (ref 0–35)
AST: 15 U/L (ref 0–37)
Albumin: 3.7 g/dL (ref 3.5–5.2)
Bilirubin, Direct: 0.1 mg/dL (ref 0.0–0.3)
Total Protein: 7.6 g/dL (ref 6.0–8.3)

## 2010-11-02 NOTE — Progress Notes (Signed)
Summary: TIA   Phone Note Call from Patient Call back at Home Phone [phone]   Caller: Patient Call For: Cindee Salt MD Summary of Call: Patient called to let Dr. Alphonsus Sias know that she experienced a TIA today.  EMS was called but she did not go to the hospital.  Wanted to know if she should go to be evaluated.  Please advise. Initial call taken by: Linde Gillis CMA Duncan Dull),  October 28, 2010 5:14 PM  Follow-up for Phone Call        I do not know this pt, but in my opinion she needs to go to ER for eval.  At minimum she needs to make appt to be seen by Dr. Alphonsus Sias early next week.  Follow-up by: Kerby Nora MD,  October 28, 2010 5:19 PM  Additional Follow-up for Phone Call Additional follow up Details #1::        Advised pt.  She said she feels fine now and EMS told her they told her they didnt think she needed to go to ER.  Pt did agree to see Dr. Alphonsus Sias next week, appt scheduled for monday.               Lowella Petties CMA, AAMA  October 28, 2010 5:30 PM

## 2010-11-05 ENCOUNTER — Encounter: Payer: Self-pay | Admitting: Internal Medicine

## 2010-11-05 ENCOUNTER — Ambulatory Visit: Payer: Self-pay | Admitting: Internal Medicine

## 2010-11-08 ENCOUNTER — Encounter: Payer: Self-pay | Admitting: Internal Medicine

## 2010-11-08 DIAGNOSIS — R0989 Other specified symptoms and signs involving the circulatory and respiratory systems: Secondary | ICD-10-CM | POA: Insufficient documentation

## 2010-11-08 NOTE — Assessment & Plan Note (Signed)
Summary: ? TIA   Vital Signs:  Patient profile:   65 year old female Weight:      128 pounds Temp:     98.9 degrees F oral Pulse rate:   70 / minute Pulse rhythm:   regular BP sitting:   120 / 72  (left arm) Cuff size:   regular  Vitals Entered By: Mervin Hack CMA Duncan Dull) (October 31, 2010 11:15 AM) CC: possible stroke?   History of Present Illness: Was sitting, watching TV and having a lollipop on Friday Husband was there--- he was in next room. She had been telling him how to make soup, talking back and forth Suddenly, no answer. He saw her sitting there with eyes open and hand holding lollipop just outside her lips Not able to see him but was sitting up still No response to his voice She was rigid  He called EMTs Quick response and she started coming out of it about that time (?10 minutes) Initally some slurred speech, then cleared Soon, able to walk and then within about 20 minutes, back to normal  No tremors or shakes No incontinence of bladder or bowel Face "contorted" but not asymmetric No focal weakness  Allergies: 1)  ! Codeine Sulfate (Codeine Sulfate) 2)  ! Zoloft (Sertraline Hcl)  Past History:  Past medical, surgical, family and social histories (including risk factors) reviewed for relevance to current acute and chronic problems.  Past Medical History: Reviewed history from 06/07/2009 and no changes required. Diabetes mellitus, type II Diverticulosis, colon Hyperlipidemia Chronic back pain Bipolar-----------------------------------------------------Dr Mickiewicz Osteopenia  Past Surgical History: Reviewed history from 01/08/2007 and no changes required. 1988  Back surgery Cholecystectomy--1970 Admitted for mental health issues x 3  Family History: Reviewed history from 03/31/2009 and no changes required. Dad died @69 ---MVA, had CVA young Mom has DM, HTN No hx of cancer Sister had CVA Daughter has Hashimoto's  Social  History: Reviewed history from 01/08/2007 and no changes required. 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   Eyes:  pupils equal, pupils round, pupils reactive to light, no optic disk abnormalities, and no nystagmus.   Mouth:  no erythema and no exudates.   Neck:  supple, no masses, no thyromegaly, no carotid bruits, and no cervical lymphadenopathy.   Lungs:  normal respiratory effort, no intercostal retractions, no accessory muscle use, and normal breath sounds.   Heart:  normal rate, regular rhythm, no murmur, and no gallop.   Extremities:  no edema Neurologic:  alert & oriented X3, cranial nerves II-XII intact, strength normal in all extremities, gait normal, finger-to-nose normal, and Romberg negative.   Psych:  normally interactive, good eye contact, not anxious appearing, and not depressed appearing.     Impression & Recommendations:  Problem # 1:  TIA (ICD-435.9) Assessment New  not clear cut could also be tonic seizure but without incontinence, tongue biting or post ictal period of note, this is unlikely May be dystonic reaction from the carisprodol---not sure that is common but it is possible  P: check labs     Brain MRI with contrast     labs     neuro eval if recurs    no  med changes  Her updated medication list for this problem includes:    Aspirin 81 Mg Tabs (Aspirin) .Marland Kitchen... Take 1 by mouth once daily  Orders: Doppler Referral (Doppler) Radiology Referral (Radiology) TLB-Renal Function Panel (80069-RENAL) TLB-CBC Platelet - w/Differential (85025-CBCD) TLB-TSH (Thyroid Stimulating Hormone) (84443-TSH) TLB-Hepatic/Liver Function Pnl  (80076-HEPATIC) Venipuncture (16109) TLB-B12, Serum-Total ONLY (60454-U98) TLB-Sedimentation Rate (ESR) (85652-ESR)  Complete Medication List: 1)  Gabapentin 300 Mg Caps (Gabapentin) .Marland Kitchen.. 1 cap by mouth three times a day for nerve related back pain 2)  Carisoprodol 350 Mg Tabs (Carisoprodol) .... Take 1 tablet by mouth three times a day as needed for muscle spasm 3)  Trileptal 300 Mg Tabs (Oxcarbazepine) .... Take 2 by mouth in the am and 1 by mouth in the pm 4)  Metformin Hcl 500 Mg Tabs (Metformin hcl) .... Take one by mouth two times a day 5)  Geodon 80 Mg Caps (Ziprasidone hcl) .... Take 3 by mouth once daily 6)  Seroquel 50 Mg Tabs (Quetiapine fumarate) .... As needed per patient 7)  Vitamin E 400 Unit Caps (Vitamin e) .... Take one by mouth daily 8)  Aspirin 81 Mg Tabs (Aspirin) .... Take 1 by mouth once daily 9)  Calcium 500 Mg Tabs (Calcium carbonate) .... Take 1 by mouth twice daily 10)  Vitamin B-12 1000 Mcg Tabs (Cyanocobalamin) .... Take 1 by mouth once daily  Other Orders: TLB-A1C / Hgb A1C (Glycohemoglobin) (83036-A1C)  Patient Instructions: 1)  Please schedule a follow-up appointment in 2-3 weeks.  2)  Please set up carotid test and brain MRI   Orders Added: 1)  Doppler Referral [Doppler] 2)  Radiology Referral [Radiology] 3)  TLB-Renal Function Panel [80069-RENAL] 4)  TLB-CBC Platelet - w/Differential [85025-CBCD] 5)  TLB-TSH (Thyroid Stimulating Hormone) [84443-TSH] 6)  TLB-Hepatic/Liver Function Pnl [80076-HEPATIC] 7)  Venipuncture [36415] 8)  TLB-B12, Serum-Total ONLY [82607-B12] 9)  TLB-Sedimentation Rate (ESR) [85652-ESR] 10)  TLB-A1C / Hgb A1C (Glycohemoglobin) [83036-A1C] 11)  Est. Patient Level IV [11914]    Current Allergies (reviewed today): ! CODEINE SULFATE (CODEINE SULFATE) ! ZOLOFT (SERTRALINE HCL)

## 2010-11-09 ENCOUNTER — Encounter (INDEPENDENT_AMBULATORY_CARE_PROVIDER_SITE_OTHER): Payer: Medicare Other

## 2010-11-09 ENCOUNTER — Encounter: Payer: Self-pay | Admitting: Internal Medicine

## 2010-11-09 DIAGNOSIS — G459 Transient cerebral ischemic attack, unspecified: Secondary | ICD-10-CM

## 2010-11-16 ENCOUNTER — Ambulatory Visit: Payer: Medicare Other | Admitting: Family Medicine

## 2010-11-17 NOTE — Miscellaneous (Signed)
Summary: Orders Update  Clinical Lists Changes  Problems: Added new problem of CAROTID BRUIT (ICD-785.9) Orders: Added new Test order of Carotid Duplex (Carotid Duplex) - Signed 

## 2010-11-21 ENCOUNTER — Ambulatory Visit (INDEPENDENT_AMBULATORY_CARE_PROVIDER_SITE_OTHER): Payer: Medicare Other | Admitting: Internal Medicine

## 2010-11-21 ENCOUNTER — Encounter: Payer: Self-pay | Admitting: Internal Medicine

## 2010-11-21 DIAGNOSIS — G459 Transient cerebral ischemic attack, unspecified: Secondary | ICD-10-CM

## 2010-11-21 DIAGNOSIS — M549 Dorsalgia, unspecified: Secondary | ICD-10-CM

## 2010-11-29 NOTE — Assessment & Plan Note (Signed)
Summary: 3WK FOLLOW UP / LFW   Vital Signs:  Patient profile:   65 year old female Weight:      133 pounds Temp:     97.7 degrees F oral Pulse rate:   78 / minute Pulse rhythm:   regular BP sitting:   108 / 68  (left arm) Cuff size:   regular  Vitals Entered By: Mervin Hack CMA Duncan Dull) (November 21, 2010 10:03 AM) CC: follow-up visit, back pain and to discuss tests   History of Present Illness: Had been doing fine  yesterday she took 2 muscle relaxers after breakfast  then she sat down with cup of coffee---hand started shaking and she was spilling it Seemed out of it again to husband--dazed Lasted only 30-40 seconds Then back to normal by shortly after that  Has needed the soma due to ongoing back problems This has helped pain goes down leg as well Has some limp  PT in past--didn't help Poor response to chiropractic Lidoderm does help some  Allergies: 1)  ! Codeine Sulfate (Codeine Sulfate) 2)  ! Zoloft (Sertraline Hcl)  Past History:  Past medical, surgical, family and social histories (including risk factors) reviewed for relevance to current acute and chronic problems.  Past Medical History: Reviewed history from 06/07/2009 and no changes required. Diabetes mellitus, type II Diverticulosis, colon Hyperlipidemia Chronic back pain Bipolar-----------------------------------------------------Dr Mickiewicz Osteopenia  Past Surgical History: Reviewed history from 01/08/2007 and no changes required. 1988  Back surgery Cholecystectomy--1970 Admitted for mental health issues x 3  Family History: Reviewed history from 03/31/2009 and no changes required. Dad died @69 ---MVA, had CVA young Mom has DM, HTN No hx of cancer Sister had CVA Daughter has Hashimoto's  Social History: Reviewed history from 01/08/2007 and no changes required. 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   Psych:  normally interactive, good eye contact, not anxious appearing, and not depressed appearing.     Impression & Recommendations:  Problem # 1:  TIA (ICD-435.9) Assessment Improved had recurrent symptom that clearly seemed related to carisprodol no further eval for this MRI and carotids were fine  Her updated medication list for this problem includes:    Aspirin 81 Mg Tabs (Aspirin) .Marland Kitchen... Take 1 by mouth once daily  Problem # 2:  BACK PAIN, CHRONIC (ICD-724.5) Assessment: Deteriorated has worsened can't tolerate the soma will use the lidoderm and heat regular tylenol PT and chiropractic unsuccessful try increased gabapentin at night  The following medications were removed from the medication list:    Carisoprodol 350 Mg Tabs (Carisoprodol) .Marland Kitchen... Take 1 tablet by mouth three times a day as needed for muscle spasm Her updated medication list for this problem includes:    Aspirin 81 Mg Tabs (Aspirin) .Marland Kitchen... Take 1 by mouth once daily  Complete Medication List: 1)  Gabapentin 300 Mg Caps (Gabapentin) .Marland Kitchen.. 1 capsule  by mouth twice daily and 2 capsules at night for nerve related back pain 2)  Trileptal 300 Mg Tabs (Oxcarbazepine) .... Take 2 by mouth in the am and 1 by mouth in the pm 3)  Metformin Hcl 500 Mg Tabs (Metformin hcl) .... Take one by mouth two times a day 4)  Geodon 80 Mg Caps (Ziprasidone hcl) .... Take 3 by mouth once daily 5)  Seroquel 50 Mg Tabs (Quetiapine fumarate) .... As needed per patient 6)  Vitamin E 400 Unit Caps (Vitamin e) .Marland KitchenMarland KitchenMarland Kitchen  Take one by mouth daily 7)  Aspirin 81 Mg Tabs (Aspirin) .... Take 1 by mouth once daily 8)  Calcium 500 Mg Tabs (Calcium carbonate) .... Take 1 by mouth twice daily 9)  Vitamin B-12 1000 Mcg Tabs (Cyanocobalamin) .... Take 1 by mouth once daily  Patient Instructions: 1)  Please increase gabapentin to 2 capsules at night 2)  Stop the carisprodol 3)  Try tylenol 500-650mg  three  times a day for back pain 4)  Use heat on your back 5)  Please schedule a follow-up appointment in 1 month.  Prescriptions: GABAPENTIN 300 MG CAPS (GABAPENTIN) 1 capsule  by mouth twice daily and 2 capsules at night for nerve related back pain  #120 x 0   Entered and Authorized by:   Cindee Salt MD   Signed by:   Cindee Salt MD on 11/21/2010   Method used:   [facility]ronically to        [facility]  [phone] (retail)       [address]       [place], [place]  [address]       Ph: [phone] or [phone]       Fax: [phone]   RxID:   [id number]    Orders Added: 1)  Est. Patient Level III [34742]    Current Allergies (reviewed today): ! CODEINE SULFATE (CODEINE SULFATE) ! ZOLOFT (SERTRALINE HCL)

## 2010-12-09 ENCOUNTER — Telehealth: Payer: Self-pay | Admitting: *Deleted

## 2010-12-09 MED ORDER — GABAPENTIN 300 MG PO CAPS: 300.0000 mg | ORAL_CAPSULE | Freq: Three times a day (TID) | ORAL | Status: DC

## 2010-12-09 NOTE — Telephone Encounter (Signed)
Spoke with patient and advised results, med changed in chart

## 2010-12-09 NOTE — Telephone Encounter (Signed)
Pt wants to increase her gabapentin dose to 2, three times a day.  She has appt to see you on 4/12 and will report then on how she has done on this dose.  OK to increase?

## 2010-12-09 NOTE — Telephone Encounter (Signed)
Okay to try up to 600mg  tid  She should watch for sedation or trouble with concentration, for example

## 2010-12-21 ENCOUNTER — Encounter: Payer: Self-pay | Admitting: Internal Medicine

## 2010-12-23 ENCOUNTER — Encounter: Payer: Self-pay | Admitting: Internal Medicine

## 2010-12-23 ENCOUNTER — Ambulatory Visit (INDEPENDENT_AMBULATORY_CARE_PROVIDER_SITE_OTHER): Payer: Medicare Other | Admitting: Internal Medicine

## 2010-12-23 VITALS — BP 120/80 | HR 82 | Temp 98.8°F | Ht 63.0 in | Wt 134.0 lb

## 2010-12-23 DIAGNOSIS — M549 Dorsalgia, unspecified: Secondary | ICD-10-CM

## 2010-12-23 MED ORDER — GABAPENTIN 600 MG PO TABS: 600.0000 mg | ORAL_TABLET | Freq: Three times a day (TID) | ORAL | Status: DC

## 2010-12-23 NOTE — Progress Notes (Signed)
  Subjective:    Patient ID: Diana Roberson, female    DOB: November 07, 1945, 65 y.o.   MRN: [id number]  HPI Has gone up to 600mg  tid on the gabapentin This seems to be helping her pain Still gets increased symptoms at night---wonders about another muscle relaxer for bedtime  No recurrence of neurologic symtpoms---clearly seems to be related to the carisoprodol  Does initiate sleep okay with the seroquel Awakens feeling well in the morning  Mood has been good  Current outpatient prescriptions:aspirin 81 MG tablet, Take 81 mg by mouth daily.  , Disp: , Rfl: ;  calcium gluconate 500 MG tablet, Take 500 mg by mouth daily.  , Disp: , Rfl: ;  gabapentin (NEURONTIN) 300 MG capsule, Take 600 mg by mouth 3 (three) times daily.  , Disp: , Rfl: ;  metFORMIN (GLUCOPHAGE) 500 MG tablet, Take 500 mg by mouth 2 (two) times daily with a meal.  , Disp: , Rfl:  QUEtiapine (SEROQUEL) 50 MG tablet, Take 50 mg by mouth as needed.  , Disp: , Rfl: ;  vitamin B-12 (CYANOCOBALAMIN) 1000 MCG tablet, Take 1,000 mcg by mouth daily.  , Disp: , Rfl: ;  vitamin E 400 UNIT capsule, Take 400 Units by mouth daily.  , Disp: , Rfl: ;  ziprasidone (GEODON) 80 MG capsule, Take 240 mg by mouth daily.  , Disp: , Rfl:  DISCONTD: gabapentin (NEURONTIN) 300 MG capsule, Take 1 capsule (300 mg total) by mouth 3 (three) times daily., Disp: 90 capsule, Rfl: 2;  DISCONTD: Oxcarbazepine (TRILEPTAL) 300 MG tablet, Take 2 tablets by mouth in the morning and 1 by mouth in the evening , Disp: , Rfl:   Past Medical History  Diagnosis Date  . Diabetes mellitus   . Diverticulitis   . Hyperlipidemia   . Chronic back pain   . Bipolar 1 disorder   . Osteopenia   . Mental health disorder     admitted x3    Past Surgical History  Procedure Date  . Back surgery 1988  . Cholecystectomy     Family History  Problem Relation Age of Onset  . Diabetes Mother   . Hypertension Mother   . Stroke Sister   . Hashimoto's thyroiditis Daughter   .  Cancer Neg Hx     History   Social History  . Marital Status: Married    Spouse Name: N/A    Number of Children: 2  . Years of Education: N/A   Occupational History  . formerly Designer, jewellery for [employer]    Social History Main Topics  . Smoking status: Never Smoker   . Smokeless tobacco: Never Used  . Alcohol Use: No  . Drug Use: Not on file  . Sexually Active: Not on file   Other Topics Concern  . Not on file   Social History Narrative  . No narrative on file   Review of Systems Appetite is good Weight is stable    Objective:   Physical Exam  Neurological:       Normal gait No weakness  Psychiatric: She has a normal mood and affect. Her behavior is normal. Judgment and thought content normal.          Assessment & Plan:

## 2011-04-28 ENCOUNTER — Ambulatory Visit: Payer: Medicare Other | Admitting: Internal Medicine

## 2011-04-28 DIAGNOSIS — Z0289 Encounter for other administrative examinations: Secondary | ICD-10-CM

## 2011-06-12 ENCOUNTER — Other Ambulatory Visit: Payer: Self-pay | Admitting: Internal Medicine

## 2011-06-23 ENCOUNTER — Encounter: Payer: Self-pay | Admitting: Internal Medicine

## 2011-07-07 ENCOUNTER — Encounter: Payer: Self-pay | Admitting: Family Medicine

## 2011-07-07 ENCOUNTER — Ambulatory Visit (INDEPENDENT_AMBULATORY_CARE_PROVIDER_SITE_OTHER): Payer: Medicare Other | Admitting: Family Medicine

## 2011-07-07 VITALS — BP 120/82 | HR 86 | Temp 98.1°F | Ht 63.0 in | Wt 134.8 lb

## 2011-07-07 DIAGNOSIS — R300 Dysuria: Secondary | ICD-10-CM

## 2011-07-07 LAB — POCT URINALYSIS DIPSTICK
Bilirubin, UA: NEGATIVE
Glucose, UA: NEGATIVE
Ketones, UA: NEGATIVE
Spec Grav, UA: 1.03
Urobilinogen, UA: 0.2

## 2011-07-07 MED ORDER — CIPROFLOXACIN HCL 500 MG PO TABS: 500.0000 mg | ORAL_TABLET | Freq: Two times a day (BID) | ORAL | Status: AC

## 2011-07-07 NOTE — Patient Instructions (Signed)

## 2011-07-07 NOTE — Progress Notes (Signed)
SUBJECTIVE: Diana Roberson is a 65 y.o. female who complains of urinary frequency, urgency and dysuria x 7 days, without flank pain, fever, chills, or abnormal vaginal discharge or bleeding.   Patient Active Problem List  Diagnoses  . HERPES ZOSTER  . DIABETES MELLITUS, TYPE II  . HYPERLIPIDEMIA  . BIPOLAR DISORDER UNSPECIFIED  . DIVERTICULOSIS, COLON  . BACK PAIN, CHRONIC  . OSTEOPENIA  . MEMORY LOSS  . ELEVATED BLOOD PRESSURE WITHOUT DIAGNOSIS OF HYPERTENSION  . TIA  . CAROTID BRUIT   Past Medical History  Diagnosis Date  . Diabetes mellitus   . Diverticulitis   . Hyperlipidemia   . Chronic back pain   . Bipolar 1 disorder   . Osteopenia   . Mental health disorder     admitted x3   Past Surgical History  Procedure Date  . Back surgery 1988  . Cholecystectomy    History  Substance Use Topics  . Smoking status: Never Smoker   . Smokeless tobacco: Never Used  . Alcohol Use: No   Family History  Problem Relation Age of Onset  . Diabetes Mother   . Hypertension Mother   . Stroke Sister   . Hashimoto's thyroiditis Daughter   . Cancer Neg Hx    Allergies  Allergen Reactions  . Codeine Sulfate   . Sertraline Hcl     REACTION: Worse, ? irritable   Current Outpatient Prescriptions on File Prior to Visit  Medication Sig Dispense Refill  . aspirin 81 MG tablet Take 81 mg by mouth daily.        . calcium gluconate 500 MG tablet Take 500 mg by mouth daily.        Marland Kitchen gabapentin (NEURONTIN) 600 MG tablet Take 1 tablet (600 mg total) by mouth 3 (three) times daily.  270 tablet  1  . metFORMIN (GLUCOPHAGE) 500 MG tablet TAKE ONE TABLET BY MOUTH TWICE DAILY  180 tablet  3  . QUEtiapine (SEROQUEL) 50 MG tablet Take 50 mg by mouth as needed.        . vitamin B-12 (CYANOCOBALAMIN) 1000 MCG tablet Take 1,000 mcg by mouth daily.        . vitamin E 400 UNIT capsule Take 400 Units by mouth daily.        . ziprasidone (GEODON) 80 MG capsule Take 240 mg by mouth daily.          The PMH, PSH, Social History, Family History, Medications, and allergies have been reviewed in [facility], and have been updated if relevant.   OBJECTIVE: BP 120/82  Pulse 86  Temp(Src) 98.1 F (36.7 C) (Oral)  Ht 5\' 3"  (1.6 m)  Wt 134 lb 12 oz (61.122 kg)  BMI 23.87 kg/m2   Appears well, in no apparent distress.  Vital signs are normal. The abdomen is soft without tenderness, guarding, mass, rebound or organomegaly. No CVA tenderness or inguinal adenopathy noted. Urine dipstick shows positive for RBC's, positive for protein and positive for leukocytes. .   ASSESSMENT: UTI uncomplicated without evidence of pyelonephritis  PLAN: Cipro Treatment per orders - also push fluids, may use Pyridium OTC prn. Call or return to clinic prn if these symptoms worsen or fail to improve as anticipated.

## 2011-07-10 ENCOUNTER — Telehealth: Payer: Self-pay | Admitting: Internal Medicine

## 2011-07-10 LAB — URINE CULTURE: Colony Count: >=100000

## 2011-07-10 MED ORDER — CIPROFLOXACIN HCL 500 MG PO TABS: 500.0000 mg | ORAL_TABLET | Freq: Two times a day (BID) | ORAL | Status: AC

## 2011-07-10 NOTE — Telephone Encounter (Signed)
Already done this morning

## 2011-07-10 NOTE — Telephone Encounter (Signed)
Pt stated she saw Dr. Dayton Martes last week for UTI and she needs a refill on antibiotic because she is not feeling any better and is still experiencing urgency and pain.  [phone] and the patient's call back # is [phone]. Thanks

## 2011-07-10 NOTE — Progress Notes (Signed)
Addended by: Dianne Dun on: 07/10/2011 11:33 AM   Modules accepted: Orders

## 2011-09-06 ENCOUNTER — Encounter: Payer: Self-pay | Admitting: Family Medicine

## 2011-09-06 ENCOUNTER — Ambulatory Visit (INDEPENDENT_AMBULATORY_CARE_PROVIDER_SITE_OTHER): Payer: Medicare Other | Admitting: Family Medicine

## 2011-09-06 VITALS — BP 114/70 | HR 96 | Temp 99.1°F | Ht 63.0 in | Wt 130.5 lb

## 2011-09-06 DIAGNOSIS — J209 Acute bronchitis, unspecified: Secondary | ICD-10-CM | POA: Insufficient documentation

## 2011-09-06 MED ORDER — AMOXICILLIN-POT CLAVULANATE 875-125 MG PO TABS: 1.0000 | ORAL_TABLET | Freq: Two times a day (BID) | ORAL | Status: AC

## 2011-09-06 NOTE — Assessment & Plan Note (Signed)
With 2 weeks of steadily worsening cough and congestion-and now low grade fever In light of length of course will cover with augmentin - with inst to call if worse or any reactive airways Disc symptomatic care - see instructions on AVS

## 2011-09-06 NOTE — Progress Notes (Signed)
Subjective:    Patient ID: Diana Roberson, female    DOB: 1946/07/28, 65 y.o.   MRN: [id number]  HPI Has had symptoms for 2 weeks  Started with sore throat and dry mouth- then post nasal drainage  Hardly any nasal congestion  A little pressure around cheeks and ears are fine  Then cough started 1-2 days later  Could hear junk in her lungs  phelgm is green in color  Low grade fever today -- did not check at home   Did not have the flu shot this year- declines them    No meds over the counter   Not getting better at all   Patient Active Problem List  Diagnoses  . HERPES ZOSTER  . DIABETES MELLITUS, TYPE II  . HYPERLIPIDEMIA  . BIPOLAR DISORDER UNSPECIFIED  . DIVERTICULOSIS, COLON  . BACK PAIN, CHRONIC  . OSTEOPENIA  . MEMORY LOSS  . ELEVATED BLOOD PRESSURE WITHOUT DIAGNOSIS OF HYPERTENSION  . TIA  . CAROTID BRUIT  . Bronchitis, acute   Past Medical History  Diagnosis Date  . Diabetes mellitus   . Diverticulitis   . Hyperlipidemia   . Chronic back pain   . Bipolar 1 disorder   . Osteopenia   . Mental health disorder     admitted x3   Past Surgical History  Procedure Date  . Back surgery 1988  . Cholecystectomy    History  Substance Use Topics  . Smoking status: Never Smoker   . Smokeless tobacco: Never Used  . Alcohol Use: No   Family History  Problem Relation Age of Onset  . Diabetes Mother   . Hypertension Mother   . Stroke Sister   . Hashimoto's thyroiditis Daughter   . Cancer Neg Hx    Allergies  Allergen Reactions  . Codeine Sulfate   . Sertraline Hcl     REACTION: Worse, ? irritable   Current Outpatient Prescriptions on File Prior to Visit  Medication Sig Dispense Refill  . aspirin 81 MG tablet Take 81 mg by mouth daily.        . calcium gluconate 500 MG tablet Take 500 mg by mouth daily.        Marland Kitchen gabapentin (NEURONTIN) 600 MG tablet Take 1 tablet (600 mg total) by mouth 3 (three) times daily.  270 tablet  1  . metFORMIN (GLUCOPHAGE)  500 MG tablet TAKE ONE TABLET BY MOUTH TWICE DAILY  180 tablet  3  . QUEtiapine (SEROQUEL) 50 MG tablet Take 50 mg by mouth as needed.        . vitamin B-12 (CYANOCOBALAMIN) 1000 MCG tablet Take 1,000 mcg by mouth daily.        . vitamin E 400 UNIT capsule Take 400 Units by mouth daily.        . ziprasidone (GEODON) 80 MG capsule Take 160 mg by mouth daily.             Review of Systems Review of Systems  Constitutional: Negative for appetite change, fatigue and unexpected weight change.  Eyes: Negative for pain and visual disturbance. ENt pos for mild post nasal drip/ neg for sinus pain   Respiratory: Negative for shortness of breath or wheeze   Cardiovascular: Negative for cp or palpitations    Gastrointestinal: Negative for nausea, diarrhea and constipation.  Genitourinary: Negative for urgency and frequency.  Skin: Negative for pallor or rash   Neurological: Negative for weakness, light-headedness, numbness and headaches.  Hematological: Negative for adenopathy. Does  not bruise/bleed easily.  Psychiatric/Behavioral: Negative for dysphoric mood. The patient is not nervous/anxious.          Objective:   Physical Exam  Constitutional: She appears well-developed and well-nourished. No distress.  HENT:  Head: Normocephalic and atraumatic.  Right Ear: External ear normal.  Left Ear: External ear normal.  Mouth/Throat: Oropharynx is clear and moist. No oropharyngeal exudate.       Nares are boggy Clear post nasal drip noted  No sinus tenderness  Eyes: Conjunctivae and EOM are normal. Pupils are equal, round, and reactive to light. Right eye exhibits no discharge. Left eye exhibits no discharge.  Neck: Normal range of motion. Neck supple. No thyromegaly present.  Cardiovascular: Normal rate, regular rhythm and normal heart sounds.   Pulmonary/Chest: Effort normal. No respiratory distress. She has wheezes. She has no rales. She exhibits no tenderness.       Scant wheeze on forced  exp only No prolonged exp phase Rhonchi at bases   Lymphadenopathy:    She has no cervical adenopathy.  Neurological: She is alert.  Skin: Skin is warm and dry. No rash noted.  Psychiatric: Her affect is blunt. Her affect is not inappropriate.          Assessment & Plan:

## 2011-09-06 NOTE — Patient Instructions (Signed)
For over the counter cough use  delsym as directed  Drink lots of fluids  Try to get some extra rest  Take the augmentin as directed  I sent your px to the pharmacy  If worse/ any wheezing/ or high fever- please call

## 2011-09-19 ENCOUNTER — Other Ambulatory Visit: Payer: Self-pay | Admitting: Internal Medicine

## 2011-09-19 MED ORDER — GABAPENTIN 600 MG PO TABS: 600.0000 mg | ORAL_TABLET | Freq: Three times a day (TID) | ORAL | Status: DC

## 2011-09-19 NOTE — Telephone Encounter (Signed)
Patient would like the Rx printed so she can mail it to [facility].

## 2011-09-19 NOTE — Telephone Encounter (Signed)
Spoke with patient and advised results   

## 2011-11-10 ENCOUNTER — Encounter: Payer: Self-pay | Admitting: Family Medicine

## 2011-11-10 ENCOUNTER — Ambulatory Visit (INDEPENDENT_AMBULATORY_CARE_PROVIDER_SITE_OTHER): Payer: Medicare Other | Admitting: Family Medicine

## 2011-11-10 DIAGNOSIS — R51 Headache: Secondary | ICD-10-CM

## 2011-11-10 MED ORDER — TRAMADOL HCL 50 MG PO TABS: 50.0000 mg | ORAL_TABLET | Freq: Three times a day (TID) | ORAL | Status: AC | PRN

## 2011-11-10 NOTE — Progress Notes (Signed)
"  I'm fine now but a few nights ago, I wasn't."  A few nights ago, had 3 hours of head pressure (on the top of her head) and chest pressure.  It all gradually got better through the night.  The HA is resolved as the chest pain, except for minimal CP with yawning, sighing, and burping.  No cough.  No fevers, ear pain, rhinorrhea, ST.  No focal neuro changes.  No vision changes.  No vomiting.  Pain wasn't exertional, it came on at rest.    H/o diabetes but no h/o CAD.    ROS: See HPI.  Otherwise negative.    Meds, vitals, and allergies reviewed.   GEN: nad, alert and oriented HEENT: mucous membranes moist, tm wnl, OP wnl, sinuses not ttp NECK: supple w/o LA CV: rrr.  Minimally ttp at lower sternum PULM: ctab, no inc wob ABD: soft, +bs EXT: no edema SKIN: no acute rash

## 2011-11-10 NOTE — Patient Instructions (Signed)
Take the tramadol if you have more pain and then let us know.  Take care.

## 2011-11-12 DIAGNOSIS — R519 Headache, unspecified: Secondary | ICD-10-CM | POA: Insufficient documentation

## 2011-11-12 DIAGNOSIS — R51 Headache: Secondary | ICD-10-CM | POA: Insufficient documentation

## 2011-11-12 NOTE — Assessment & Plan Note (Signed)
With unclear source.  Benign exam.  Okay for outpatient f/u.  Chest wall pain has a benign finding on exam.  I talked with her about this, and alse her PCP.  With minimal sx, would use tramadol prn and f/u prn.  She agrees. I am uncertain about the cause of her sx but I don't suspect ominous pathology.

## 2012-05-16 ENCOUNTER — Ambulatory Visit (INDEPENDENT_AMBULATORY_CARE_PROVIDER_SITE_OTHER): Payer: Medicare Other | Admitting: Internal Medicine

## 2012-05-16 ENCOUNTER — Encounter: Payer: Self-pay | Admitting: Internal Medicine

## 2012-05-16 VITALS — BP 110/80 | HR 99 | Temp 98.3°F | Ht 63.0 in | Wt 132.0 lb

## 2012-05-16 DIAGNOSIS — M549 Dorsalgia, unspecified: Secondary | ICD-10-CM

## 2012-05-16 DIAGNOSIS — F319 Bipolar disorder, unspecified: Secondary | ICD-10-CM

## 2012-05-16 DIAGNOSIS — E785 Hyperlipidemia, unspecified: Secondary | ICD-10-CM

## 2012-05-16 DIAGNOSIS — E119 Type 2 diabetes mellitus without complications: Secondary | ICD-10-CM

## 2012-05-16 LAB — LIPID PANEL
HDL: 49.9 mg/dL (ref >39.00)
VLDL: 29 mg/dL (ref 0.0–40.0)

## 2012-05-16 LAB — TSH: TSH: 0.97 u[IU]/mL (ref 0.35–5.50)

## 2012-05-16 LAB — CBC WITH DIFFERENTIAL/PLATELET
Basophils Absolute: 0 10*3/uL (ref 0.0–0.1)
Eosinophils Absolute: 0.2 10*3/uL (ref 0.0–0.7)
HCT: 35.6 % — ABNORMAL LOW (ref 36.0–46.0)
Lymphs Abs: 1.8 10*3/uL (ref 0.7–4.0)
MCHC: 33.2 g/dL (ref 30.0–36.0)
MCV: 87.7 fl (ref 78.0–100.0)
Monocytes Absolute: 0.5 10*3/uL (ref 0.1–1.0)
Platelets: 363 10*3/uL (ref 150.0–400.0)
RDW: 14.1 % (ref 11.5–14.6)

## 2012-05-16 LAB — BASIC METABOLIC PANEL
BUN: 22 mg/dL (ref 6–23)
CO2: 28 mEq/L (ref 19–32)
Chloride: 101 mEq/L (ref 96–112)
GFR: 79.42 mL/min (ref >60.00)
Glucose, Bld: 120 mg/dL — ABNORMAL HIGH (ref 70–99)
Potassium: 4.3 mEq/L (ref 3.5–5.1)

## 2012-05-16 LAB — MICROALBUMIN / CREATININE URINE RATIO: Microalb, Ur: 1.5 mg/dL (ref 0.0–1.9)

## 2012-05-16 LAB — HEPATIC FUNCTION PANEL
Albumin: 3.9 g/dL (ref 3.5–5.2)
Total Protein: 8.1 g/dL (ref 6.0–8.3)

## 2012-05-16 LAB — HEMOGLOBIN A1C: Hgb A1c MFr Bld: 6.2 % (ref 4.6–6.5)

## 2012-05-16 MED ORDER — LIDOCAINE 5 % EX PTCH: 1.0000 | MEDICATED_PATCH | CUTANEOUS | Status: AC

## 2012-05-16 MED ORDER — METFORMIN HCL 500 MG PO TABS: 500.0000 mg | ORAL_TABLET | Freq: Two times a day (BID) | ORAL | Status: DC

## 2012-05-16 NOTE — Assessment & Plan Note (Signed)
Mood is well controlled

## 2012-05-16 NOTE — Assessment & Plan Note (Signed)
Seems to have good control  Will recheck labs 

## 2012-05-16 NOTE — Progress Notes (Signed)
Subjective:    Patient ID: Diana Roberson, female    DOB: 07/19/1946, 66 y.o.   MRN: [id number]  HPI Has had increasing back pain Worse with prolonged sitting in normal chair---then it goes into left hip area Uses the lidocaine patches rarely---ran out of old prescription More persistent in the past 2 weeks Using recliner for the past 6 months----easier to sleep in this Husband helps with cooking and she has someone in to do cleaning  No left leg weakness---but does feel it in her right leg Bowel and bladder are fine  Checks sugars every few weeks Average 120 fasting No hypoglycemic reactions UTD on eye exams  No chest pain No SOB  Bipolar Rx has been doing well Mood is good Dr Alver Fisher  Current Outpatient Prescriptions on File Prior to Visit  Medication Sig Dispense Refill  . AMANTADINE HCL PO Take 50 mg by mouth daily.      Marland Kitchen aspirin 81 MG tablet Take 81 mg by mouth daily.        . calcium gluconate 500 MG tablet Take 500 mg by mouth daily.        Marland Kitchen gabapentin (NEURONTIN) 600 MG tablet Take 1 tablet (600 mg total) by mouth 3 (three) times daily.  270 tablet  3  . metFORMIN (GLUCOPHAGE) 500 MG tablet TAKE ONE TABLET BY MOUTH TWICE DAILY  180 tablet  3  . QUEtiapine (SEROQUEL) 50 MG tablet Take 50 mg by mouth at bedtime.       . vitamin B-12 (CYANOCOBALAMIN) 1000 MCG tablet Take 1,000 mcg by mouth daily.        . vitamin E 400 UNIT capsule Take 400 Units by mouth daily.        . ziprasidone (GEODON) 80 MG capsule Take 160 mg by mouth daily.         Allergies  Allergen Reactions  . Codeine Sulfate     Indigestion and pain  . Sertraline Hcl     REACTION: Worse, ? irritable    Past Medical History  Diagnosis Date  . Diabetes mellitus   . Diverticulitis   . Hyperlipidemia   . Chronic back pain   . Bipolar 1 disorder   . Osteopenia   . Mental health disorder     admitted x3    Past Surgical History  Procedure Date  . Back surgery 1988  . Cholecystectomy       Family History  Problem Relation Age of Onset  . Diabetes Mother   . Hypertension Mother   . Stroke Sister   . Hashimoto's thyroiditis Daughter   . Cancer Neg Hx     History   Social History  . Marital Status: Married    Spouse Name: N/A    Number of Children: 2  . Years of Education: N/A   Occupational History  . formerly Designer, jewellery for [employer]    Social History Main Topics  . Smoking status: Never Smoker   . Smokeless tobacco: Never Used  . Alcohol Use: No  . Drug Use: Not on file  . Sexually Active: Not on file   Other Topics Concern  . Not on file   Social History Narrative  . No narrative on file   Review of Systems Eats prudently Weight stable Sleeps okay     Objective:   Physical Exam  Constitutional: She appears well-developed and well-nourished. No distress.  Neck: Normal range of motion. Neck supple. No thyromegaly present.  Cardiovascular: Normal  rate, regular rhythm, normal heart sounds and intact distal pulses.  Exam reveals no gallop.   No murmur heard. Pulmonary/Chest: Effort normal and breath sounds normal. No respiratory distress. She has no wheezes. She has no rales.  Abdominal: Soft. There is no tenderness.  Musculoskeletal:       Mild tenderness in lateral left lumbar area Normal ROM of hips SLR is negative Normal back flexion No spine tenderness  Lymphadenopathy:    She has no cervical adenopathy.  Neurological:       Normal gait No weakness  Skin: No rash noted. No erythema.       No foot lesions  Psychiatric: She has a normal mood and affect. Her behavior is normal.          Assessment & Plan:

## 2012-05-16 NOTE — Assessment & Plan Note (Signed)
Clearly seems to be muscular The lidoderm is the safest alternative at this point

## 2012-05-16 NOTE — Assessment & Plan Note (Signed)
Discussed Rx for those with diabetes She is willing to start meds prn Goal LDL under 100

## 2012-05-17 ENCOUNTER — Encounter: Payer: Self-pay | Admitting: *Deleted

## 2012-10-14 LAB — HM DIABETES EYE EXAM

## 2012-12-06 ENCOUNTER — Encounter: Payer: Self-pay | Admitting: Internal Medicine

## 2012-12-06 ENCOUNTER — Ambulatory Visit (INDEPENDENT_AMBULATORY_CARE_PROVIDER_SITE_OTHER): Payer: Medicare Other | Admitting: Internal Medicine

## 2012-12-06 VITALS — BP 120/80 | HR 80 | Temp 97.9°F | Wt 138.0 lb

## 2012-12-06 DIAGNOSIS — Z0000 Encounter for general adult medical examination without abnormal findings: Secondary | ICD-10-CM

## 2012-12-06 DIAGNOSIS — F319 Bipolar disorder, unspecified: Secondary | ICD-10-CM

## 2012-12-06 DIAGNOSIS — E119 Type 2 diabetes mellitus without complications: Secondary | ICD-10-CM

## 2012-12-06 DIAGNOSIS — Z780 Asymptomatic menopausal state: Secondary | ICD-10-CM

## 2012-12-06 DIAGNOSIS — M549 Dorsalgia, unspecified: Secondary | ICD-10-CM

## 2012-12-06 MED ORDER — LIDOCAINE 5 % EX PTCH: 1.0000 | MEDICATED_PATCH | CUTANEOUS | Status: DC

## 2012-12-06 NOTE — Progress Notes (Signed)
Subjective:    Patient ID: Diana Roberson, female    DOB: 1945-09-13, 67 y.o.   MRN: [id number]  HPI Here for Medicare Wellness and follow up No cigarettes or alcohol Not much exercise Still sees psychiatrist and eye doctor No falls Reviewed advanced directives No vision or hearing concerns Rx for zostavax given Due for mammo Colon due 2018 Independent with ADLs No memory problems  Still with the psychiatrist Satisfied with her treatment for bipolar Not actively depression and no mania geodon and seroquel  Back pain is still there The gabapentin helps Also has found improvement with lidocaine patches---wants refill  Checks sugars every couple of weeks Usually 100-110 fasting No hypoglycemic spells Current Outpatient Prescriptions on File Prior to Visit  Medication Sig Dispense Refill  . AMANTADINE HCL PO Take 50 mg by mouth daily.      Marland Kitchen aspirin 81 MG tablet Take 81 mg by mouth daily.        . calcium gluconate 500 MG tablet Take 500 mg by mouth daily.        . metFORMIN (GLUCOPHAGE) 500 MG tablet Take 1 tablet (500 mg total) by mouth 2 (two) times daily.  180 tablet  3  . QUEtiapine (SEROQUEL) 50 MG tablet Take 50 mg by mouth at bedtime.       . vitamin B-12 (CYANOCOBALAMIN) 1000 MCG tablet Take 1,000 mcg by mouth daily.        . vitamin E 400 UNIT capsule Take 400 Units by mouth daily.        . ziprasidone (GEODON) 80 MG capsule Take 160 mg by mouth daily.       Marland Kitchen gabapentin (NEURONTIN) 600 MG tablet Take 1 tablet (600 mg total) by mouth 3 (three) times daily.  270 tablet  3   No current facility-administered medications on file prior to visit.    Allergies  Allergen Reactions  . Codeine Sulfate     Indigestion and pain  . Sertraline Hcl     REACTION: Worse, ? irritable    Past Medical History  Diagnosis Date  . Diabetes mellitus   . Diverticulitis   . Hyperlipidemia   . Chronic back pain   . Bipolar 1 disorder   . Osteopenia   . Mental health disorder      admitted x3    Past Surgical History  Procedure Laterality Date  . Back surgery  1988  . Cholecystectomy      Family History  Problem Relation Age of Onset  . Diabetes Mother   . Hypertension Mother   . Stroke Sister   . Hashimoto's thyroiditis Daughter   . Cancer Neg Hx     History   Social History  . Marital Status: Married    Spouse Name: N/A    Number of Children: 2  . Years of Education: N/A   Occupational History  . formerly Designer, jewellery for [employer]    Social History Main Topics  . Smoking status: Never Smoker   . Smokeless tobacco: Never Used  . Alcohol Use: No  . Drug Use: Not on file  . Sexually Active: Not on file   Other Topics Concern  . Not on file   Social History Narrative   Has living will   Husband is health care POA.   Would accept resuscitation attempts   Would not want feeding tube if cognitively unaware   Review of Systems Sleeps well Appetite is fine Weight is up slightly Bowels are  good     Objective:   Physical Exam  Constitutional: She is oriented to person, place, and time. She appears well-developed and well-nourished. No distress.  Neck: Normal range of motion. Neck supple. No thyromegaly present.  Cardiovascular: Normal rate, regular rhythm, normal heart sounds and intact distal pulses.  Exam reveals no gallop.   No murmur heard. Pulmonary/Chest: Effort normal and breath sounds normal. No respiratory distress. She has no wheezes. She has no rales.  Abdominal: Soft. There is no tenderness.  Musculoskeletal:  Bilateral 2nd hammertoes  Lymphadenopathy:    She has no cervical adenopathy.  Neurological: She is alert and oriented to person, place, and time.  President-- "Obama, Bush, Clinton" [id number] Recall 3/3  Sensation intact on plantar feet  Skin:  No foot ulcers Mild plantar callous   Psychiatric: She has a normal mood and affect. Her behavior is normal.           Assessment & Plan:

## 2012-12-06 NOTE — Assessment & Plan Note (Signed)
Gabapentin helps lidoderm renewed as she found that very helpful also

## 2012-12-06 NOTE — Assessment & Plan Note (Signed)
Seems to have excellent control Due for A1c

## 2012-12-06 NOTE — Assessment & Plan Note (Signed)
Controlled on current regimen with the psychiatrist

## 2012-12-06 NOTE — Patient Instructions (Signed)
Please call [facility] [phone] to schedule your screening mammogram

## 2012-12-06 NOTE — Assessment & Plan Note (Signed)
I have personally reviewed the Medicare Annual Wellness questionnaire and have noted 1. The patient's medical and social history 2. Their use of alcohol, tobacco or illicit drugs 3. Their current medications and supplements 4. The patient's functional ability including ADL's, fall risks, home safety risks and hearing or visual             impairment. 5. Diet and physical activities 6. Evidence for depression or mood disorders  The patients weight, height, BMI and visual acuity have been recorded in the chart I have made referrals, counseling and provided education to the patient based review of the above and I have provided the pt with a written personalized care plan for preventive services.  I have provided you with a copy of your personalized plan for preventive services. Please take the time to review along with your updated medication list.  Doing well Will schedule mammo DEXA Rx for zostavax Needs to be exercising

## 2012-12-09 ENCOUNTER — Encounter: Payer: Self-pay | Admitting: *Deleted

## 2012-12-25 ENCOUNTER — Ambulatory Visit: Payer: Self-pay | Admitting: Internal Medicine

## 2012-12-30 ENCOUNTER — Encounter: Payer: Self-pay | Admitting: Internal Medicine

## 2013-01-01 ENCOUNTER — Encounter: Payer: Self-pay | Admitting: *Deleted

## 2013-01-22 ENCOUNTER — Ambulatory Visit: Payer: Self-pay | Admitting: Internal Medicine

## 2013-01-23 ENCOUNTER — Encounter: Payer: Self-pay | Admitting: Internal Medicine

## 2013-03-25 ENCOUNTER — Other Ambulatory Visit: Payer: Self-pay | Admitting: Internal Medicine

## 2013-03-27 ENCOUNTER — Ambulatory Visit (INDEPENDENT_AMBULATORY_CARE_PROVIDER_SITE_OTHER): Payer: Medicare Other | Admitting: Internal Medicine

## 2013-03-27 ENCOUNTER — Encounter: Payer: Self-pay | Admitting: Internal Medicine

## 2013-03-27 VITALS — BP 120/80 | HR 106 | Temp 97.7°F | Wt 135.0 lb

## 2013-03-27 DIAGNOSIS — J392 Other diseases of pharynx: Secondary | ICD-10-CM

## 2013-03-27 DIAGNOSIS — R198 Other specified symptoms and signs involving the digestive system and abdomen: Secondary | ICD-10-CM | POA: Insufficient documentation

## 2013-03-27 NOTE — Patient Instructions (Signed)
Please start omeprazole (prilosec) 20mg  daily ---take on an empty stomach like before breakfast or at bedtime. Start tonight before dinner if you can. Take this daily for 2 weeks. If your symptoms are gone, please take the omeprazole for another 4 weeks (total 6 weeks) and then you can stop. If you are not better in 2 weeks, call and I will make a referral to a gastrointestinal specialist

## 2013-03-27 NOTE — Assessment & Plan Note (Signed)
Recurrent after eating Has had some vomiting also Sensation of fullness in chest and need to burp No clear cut reflux but this is likely Could also be post vomiting gastritis  Only 2 cups coffee in AM. Little or no sugarless gum/candies  Will try omeprazole for 2 weeks If better--finish with another month If symptoms persist, GI eval

## 2013-03-27 NOTE — Progress Notes (Signed)
Subjective:    Patient ID: Diana Roberson, female    DOB: 1946/07/29, 67 y.o.   MRN: [id number]  HPI Having stomach problems Stomach bug with vomiting and diarrhea just over a month ago Never got completely better Still had gagging a couple of hours after eating and then slight vomiting (always after dinner) No symptoms in bed  Appetite is fine Has food aversion to certain things (new for her) No hematemesis Bowels fine---every morning but a little loose (twice each morning) No abdominal pain No substernal burning but has sensation of gas and feels she has to burp  Has tried some rolaids ---seemed to help some GERD in the past---doesn't think it was like this though  Current Outpatient Prescriptions on File Prior to Visit  Medication Sig Dispense Refill  . AMANTADINE HCL PO Take 50 mg by mouth daily.      Marland Kitchen aspirin 81 MG tablet Take 81 mg by mouth daily.        . calcium gluconate 500 MG tablet Take 500 mg by mouth daily.        Marland Kitchen gabapentin (NEURONTIN) 600 MG tablet TAKE 1 TABLET 3 TIMES A DAY  270 tablet  3  . lidocaine (LIDODERM) 5 % Place 1 patch onto the skin daily. Remove & Discard patch within 12 hours or as directed by MD  30 patch  5  . metFORMIN (GLUCOPHAGE) 500 MG tablet Take 1 tablet (500 mg total) by mouth 2 (two) times daily.  180 tablet  3  . QUEtiapine (SEROQUEL) 50 MG tablet Take 50 mg by mouth at bedtime.       . vitamin B-12 (CYANOCOBALAMIN) 1000 MCG tablet Take 1,000 mcg by mouth daily.        . vitamin E 400 UNIT capsule Take 400 Units by mouth daily.        . ziprasidone (GEODON) 80 MG capsule Take 160 mg by mouth daily.        No current facility-administered medications on file prior to visit.    Allergies  Allergen Reactions  . Codeine Sulfate     Indigestion and pain  . Sertraline Hcl     REACTION: Worse, ? irritable    Past Medical History  Diagnosis Date  . Diabetes mellitus   . Diverticulitis   . Hyperlipidemia   . Chronic back pain    . Bipolar 1 disorder   . Osteopenia   . Mental health disorder     admitted x3    Past Surgical History  Procedure Laterality Date  . Back surgery  1988  . Cholecystectomy      Family History  Problem Relation Age of Onset  . Diabetes Mother   . Hypertension Mother   . Stroke Sister   . Hashimoto's thyroiditis Daughter   . Cancer Neg Hx     History   Social History  . Marital Status: Married    Spouse Name: N/A    Number of Children: 2  . Years of Education: N/A   Occupational History  . formerly Designer, jewellery for [employer]    Social History Main Topics  . Smoking status: Never Smoker   . Smokeless tobacco: Never Used  . Alcohol Use: No  . Drug Use: Not on file  . Sexually Active: Not on file   Other Topics Concern  . Not on file   Social History Narrative   Has living will   Husband is health care POA.   Would  accept resuscitation attempts   Would not want feeding tube if cognitively unaware   Review of Systems Weight is stable No fever No cough No voice changes     Objective:   Physical Exam  Constitutional: She appears well-developed and well-nourished. No distress.  Neck: Normal range of motion. Neck supple.  Pulmonary/Chest: Effort normal and breath sounds normal. No respiratory distress. She has no wheezes. She has no rales.  Abdominal: Soft. Bowel sounds are normal. She exhibits no distension and no mass. There is tenderness. There is no rebound and no guarding.  Very mild LUQ tenderness only  Musculoskeletal: She exhibits no edema and no tenderness.  Lymphadenopathy:    She has no cervical adenopathy.          Assessment & Plan:

## 2013-05-25 ENCOUNTER — Other Ambulatory Visit: Payer: Self-pay | Admitting: Internal Medicine

## 2013-05-31 ENCOUNTER — Other Ambulatory Visit: Payer: Self-pay | Admitting: Internal Medicine

## 2013-06-09 ENCOUNTER — Encounter: Payer: Self-pay | Admitting: Internal Medicine

## 2013-06-09 ENCOUNTER — Ambulatory Visit (INDEPENDENT_AMBULATORY_CARE_PROVIDER_SITE_OTHER): Payer: Medicare Other | Admitting: Internal Medicine

## 2013-06-09 VITALS — BP 120/80 | HR 86 | Temp 98.4°F | Wt 133.0 lb

## 2013-06-09 DIAGNOSIS — M79609 Pain in unspecified limb: Secondary | ICD-10-CM

## 2013-06-09 DIAGNOSIS — M79643 Pain in unspecified hand: Secondary | ICD-10-CM | POA: Insufficient documentation

## 2013-06-09 DIAGNOSIS — E119 Type 2 diabetes mellitus without complications: Secondary | ICD-10-CM

## 2013-06-09 DIAGNOSIS — M549 Dorsalgia, unspecified: Secondary | ICD-10-CM

## 2013-06-09 DIAGNOSIS — F319 Bipolar disorder, unspecified: Secondary | ICD-10-CM

## 2013-06-09 LAB — LIPID PANEL
HDL: 41.8 mg/dL (ref >39.00)
LDL Cholesterol: 85 mg/dL (ref 0–99)
Total CHOL/HDL Ratio: 4
VLDL: 38.6 mg/dL (ref 0.0–40.0)

## 2013-06-09 LAB — MICROALBUMIN / CREATININE URINE RATIO
Creatinine,U: 110.8 mg/dL
Microalb, Ur: 2.1 mg/dL — ABNORMAL HIGH (ref 0.0–1.9)

## 2013-06-09 LAB — BASIC METABOLIC PANEL
BUN: 24 mg/dL — ABNORMAL HIGH (ref 6–23)
CO2: 29 mEq/L (ref 19–32)
Calcium: 10 mg/dL (ref 8.4–10.5)
Chloride: 105 mEq/L (ref 96–112)
Creatinine, Ser: 1 mg/dL (ref 0.4–1.2)
GFR: 62.12 mL/min (ref >60.00)
Sodium: 140 mEq/L (ref 135–145)

## 2013-06-09 LAB — CBC WITH DIFFERENTIAL/PLATELET
Eosinophils Absolute: 0.2 10*3/uL (ref 0.0–0.7)
Hemoglobin: 11.7 g/dL — ABNORMAL LOW (ref 12.0–15.0)
Lymphocytes Relative: 23.4 % (ref 12.0–46.0)
MCHC: 33.8 g/dL (ref 30.0–36.0)
MCV: 87.9 fl (ref 78.0–100.0)
Monocytes Absolute: 0.5 10*3/uL (ref 0.1–1.0)
Monocytes Relative: 5.3 % (ref 3.0–12.0)
Neutro Abs: 6.2 10*3/uL (ref 1.4–7.7)
Neutrophils Relative %: 69.2 % (ref 43.0–77.0)
Platelets: 378 10*3/uL (ref 150.0–400.0)
WBC: 9 10*3/uL (ref 4.5–10.5)

## 2013-06-09 LAB — HEPATIC FUNCTION PANEL
Alkaline Phosphatase: 84 U/L (ref 39–117)
Total Bilirubin: 0.5 mg/dL (ref 0.3–1.2)
Total Protein: 8.1 g/dL (ref 6.0–8.3)

## 2013-06-09 LAB — TSH: TSH: 0.79 u[IU]/mL (ref 0.35–5.50)

## 2013-06-09 LAB — HEMOGLOBIN A1C: Hgb A1c MFr Bld: 6.2 % (ref 4.6–6.5)

## 2013-06-09 NOTE — Assessment & Plan Note (Signed)
Controlled on meds No changes Psychiatric referral if exacerbates

## 2013-06-09 NOTE — Assessment & Plan Note (Signed)
Okay with occ lidoderm patch

## 2013-06-09 NOTE — Progress Notes (Signed)
Subjective:    Patient ID: Diana Roberson, female    DOB: Apr 22, 1946, 67 y.o.   MRN: [id number]  HPI Doing well Gagging did finally go away  Has noted some swelling in her hands at times Pain also At MCP joints Has not been a long time---maybe mildly for a few years Only hurts occasionally Discussed tylenol--- or advil or aleve (only needs meds every few weeks)  Checks sugars occasionally Low 100's fasting No hypoglycemic spells  Depression is not an issue No recent issues with mania  Still some back pain Only intermittent pain---if prolonged sitting  lidoderm patch still helps  Current Outpatient Prescriptions on File Prior to Visit  Medication Sig Dispense Refill  . aspirin 81 MG tablet Take 81 mg by mouth daily.        . calcium gluconate 500 MG tablet Take 500 mg by mouth daily.        Marland Kitchen gabapentin (NEURONTIN) 600 MG tablet TAKE 1 TABLET 3 TIMES A DAY  270 tablet  3  . lidocaine (LIDODERM) 5 % Place 1 patch onto the skin daily. Remove & Discard patch within 12 hours or as directed by MD  30 patch  5  . metFORMIN (GLUCOPHAGE) 500 MG tablet TAKE ONE TABLET BY MOUTH TWICE DAILY  180 tablet  0  . QUEtiapine (SEROQUEL) 50 MG tablet Take 50 mg by mouth at bedtime.       . vitamin B-12 (CYANOCOBALAMIN) 1000 MCG tablet Take 1,000 mcg by mouth daily.        . vitamin E 400 UNIT capsule Take 400 Units by mouth daily.        . ziprasidone (GEODON) 80 MG capsule Take 160 mg by mouth daily.        No current facility-administered medications on file prior to visit.    Allergies  Allergen Reactions  . Codeine Sulfate     Indigestion and pain  . Sertraline Hcl     REACTION: Worse, ? irritable    Past Medical History  Diagnosis Date  . Diabetes mellitus   . Diverticulitis   . Hyperlipidemia   . Chronic back pain   . Bipolar 1 disorder   . Osteopenia   . Mental health disorder     admitted x3    Past Surgical History  Procedure Laterality Date  . Back surgery  1988   . Cholecystectomy      Family History  Problem Relation Age of Onset  . Diabetes Mother   . Hypertension Mother   . Stroke Sister   . Hashimoto's thyroiditis Daughter   . Cancer Neg Hx     History   Social History  . Marital Status: Married    Spouse Name: N/A    Number of Children: 2  . Years of Education: N/A   Occupational History  . formerly Designer, jewellery for [employer]    Social History Main Topics  . Smoking status: Never Smoker   . Smokeless tobacco: Never Used  . Alcohol Use: No  . Drug Use: Not on file  . Sexual Activity: Not on file   Other Topics Concern  . Not on file   Social History Narrative   Has living will   Husband is health care POA.   Would accept resuscitation attempts   Would not want feeding tube if cognitively unaware   Review of Systems Sleeps well Appetite is fine Weight is stable     Objective:   Physical Exam  Constitutional: She appears well-developed and well-nourished. No distress.  Neck: Normal range of motion. Neck supple. No thyromegaly present.  Cardiovascular: Normal rate, regular rhythm, normal heart sounds and intact distal pulses.  Exam reveals no gallop.   No murmur heard. Pulmonary/Chest: Effort normal and breath sounds normal. No respiratory distress. She has no wheezes. She has no rales.  Musculoskeletal: She exhibits no edema and no tenderness.  Hammertoes bilaterally with slight redness on tops of 2nd and 3rd bilat  Lymphadenopathy:    She has no cervical adenopathy.  Psychiatric: She has a normal mood and affect. Her behavior is normal.          Assessment & Plan:

## 2013-06-09 NOTE — Assessment & Plan Note (Signed)
At [facility] but rare (like a few minutes every few weeks) Discussed tylenol or NSAIDs if persists No active synovitis though seems to have trace of ulnar deviation

## 2013-06-09 NOTE — Assessment & Plan Note (Signed)
Seems to be doing well Labs today--including lipids

## 2013-08-11 ENCOUNTER — Ambulatory Visit (INDEPENDENT_AMBULATORY_CARE_PROVIDER_SITE_OTHER): Payer: Medicare Other | Admitting: Internal Medicine

## 2013-08-11 ENCOUNTER — Encounter: Payer: Self-pay | Admitting: Internal Medicine

## 2013-08-11 VITALS — BP 140/80 | HR 105 | Temp 98.5°F | Wt 132.0 lb

## 2013-08-11 DIAGNOSIS — F3174 Bipolar disorder, in full remission, most recent episode manic: Secondary | ICD-10-CM

## 2013-08-11 DIAGNOSIS — M549 Dorsalgia, unspecified: Secondary | ICD-10-CM

## 2013-08-11 MED ORDER — GABAPENTIN 600 MG PO TABS: 600.0000 mg | ORAL_TABLET | Freq: Three times a day (TID) | ORAL | Status: DC

## 2013-08-11 MED ORDER — METFORMIN HCL 500 MG PO TABS: 500.0000 mg | ORAL_TABLET | Freq: Two times a day (BID) | ORAL | Status: DC

## 2013-08-11 MED ORDER — QUETIAPINE FUMARATE 50 MG PO TABS: 50.0000 mg | ORAL_TABLET | Freq: Every day | ORAL | Status: DC

## 2013-08-11 MED ORDER — LIDOCAINE 5 % EX PTCH: 1.0000 | MEDICATED_PATCH | CUTANEOUS | Status: DC

## 2013-08-11 MED ORDER — AMANTADINE HCL 100 MG PO CAPS: 100.0000 mg | ORAL_CAPSULE | Freq: Two times a day (BID) | ORAL | Status: DC

## 2013-08-11 NOTE — Progress Notes (Signed)
Pre-visit discussion using our clinic review tool. No additional management support is needed unless otherwise documented below in the visit note.  

## 2013-08-11 NOTE — Assessment & Plan Note (Signed)
Intermittent bad days Handicapped permit done for when back is bad

## 2013-08-11 NOTE — Progress Notes (Signed)
Subjective:    Patient ID: Diana Roberson, female    DOB: May 19, 1946, 67 y.o.   MRN: [id number]  HPI Here to review her meds Needs refills on some meds  No longer seeing psychiatrist Has been stable on her current regimen Didn't do well on one mood stabilizer along Takes the quetiapine at bedtime-- 50mg   Has the geodon and takes with food (fat)-- at dinner Takes 80mg  most of the time Increases to 160mg  if any signs of mania (husband better than her about noticing this)  Current Outpatient Prescriptions on File Prior to Visit  Medication Sig Dispense Refill  . amantadine (SYMMETREL) 100 MG capsule Take 100 mg by mouth 2 (two) times daily.      Marland Kitchen aspirin 81 MG tablet Take 81 mg by mouth daily.        . calcium gluconate 500 MG tablet Take 500 mg by mouth daily.        Marland Kitchen gabapentin (NEURONTIN) 600 MG tablet TAKE 1 TABLET 3 TIMES A DAY  270 tablet  3  . lidocaine (LIDODERM) 5 % Place 1 patch onto the skin daily. Remove & Discard patch within 12 hours or as directed by MD  30 patch  5  . metFORMIN (GLUCOPHAGE) 500 MG tablet TAKE ONE TABLET BY MOUTH TWICE DAILY  180 tablet  0  . QUEtiapine (SEROQUEL) 50 MG tablet Take 50 mg by mouth at bedtime.       . vitamin B-12 (CYANOCOBALAMIN) 1000 MCG tablet Take 1,000 mcg by mouth daily.        . vitamin E 400 UNIT capsule Take 400 Units by mouth daily.        . ziprasidone (GEODON) 80 MG capsule Take 160 mg by mouth daily.        No current facility-administered medications on file prior to visit.    Allergies  Allergen Reactions  . Codeine Sulfate     Indigestion and pain  . Sertraline Hcl     REACTION: Worse, ? irritable    Past Medical History  Diagnosis Date  . Diabetes mellitus   . Diverticulitis   . Hyperlipidemia   . Chronic back pain   . Bipolar 1 disorder   . Osteopenia   . Mental health disorder     admitted x3    Past Surgical History  Procedure Laterality Date  . Back surgery  1988  . Cholecystectomy       Family History  Problem Relation Age of Onset  . Diabetes Mother   . Hypertension Mother   . Stroke Sister   . Hashimoto's thyroiditis Daughter   . Cancer Neg Hx     History   Social History  . Marital Status: Married    Spouse Name: N/A    Number of Children: 2  . Years of Education: N/A   Occupational History  . formerly Designer, jewellery for [employer]    Social History Main Topics  . Smoking status: Never Smoker   . Smokeless tobacco: Never Used  . Alcohol Use: No  . Drug Use: Not on file  . Sexual Activity: Not on file   Other Topics Concern  . Not on file   Social History Narrative   Has living will   Husband is health care POA.   Would accept resuscitation attempts   Would not want feeding tube if cognitively unaware   Review of Systems Appetite is good---careful with eating Weight is stable Sleeps fine  Objective:   Physical Exam  Psychiatric: She has a normal mood and affect. Her behavior is normal.  Appropriate dress and appearance Speech is clear and not rushed No mania          Assessment & Plan:

## 2013-08-11 NOTE — Assessment & Plan Note (Signed)
Doing well on current regimen Rarely has brief down times----but mostly just the mania No changes appropriate for now

## 2013-08-11 NOTE — Addendum Note (Signed)
Addended by: Sueanne Margarita on: 08/11/2013 10:35 AM   Modules accepted: Orders

## 2013-10-29 ENCOUNTER — Telehealth: Payer: Self-pay

## 2013-10-29 NOTE — Telephone Encounter (Signed)
Pt request refill seroquel to [facility]; pt has had to change mail order pharmacies. Pt is not sure if has acct with [facility] and does not want to pick up printed rx. Pt will cb after sets up acct with [facility] so med can be sent electronically.

## 2013-11-07 ENCOUNTER — Other Ambulatory Visit: Payer: Self-pay

## 2013-11-07 MED ORDER — QUETIAPINE FUMARATE 50 MG PO TABS: 50.0000 mg | ORAL_TABLET | Freq: Every day | ORAL | Status: DC

## 2013-11-07 NOTE — Telephone Encounter (Signed)
Okay to fill for a year 

## 2013-11-07 NOTE — Telephone Encounter (Signed)
rx sent to pharmacy by e-script  

## 2013-11-07 NOTE — Telephone Encounter (Signed)
Pt left v/m requesting 90 day refill seroquel to [facility] [address]; pt used to get at mail order pharmacy but wants to start getting at local pharmacy.Please advise.

## 2014-01-06 LAB — HM DIABETES EYE EXAM

## 2014-02-09 ENCOUNTER — Encounter: Payer: Self-pay | Admitting: Internal Medicine

## 2014-02-09 ENCOUNTER — Ambulatory Visit (INDEPENDENT_AMBULATORY_CARE_PROVIDER_SITE_OTHER): Payer: Medicare Other | Admitting: Internal Medicine

## 2014-02-09 VITALS — BP 118/80 | HR 82 | Temp 98.4°F | Ht 63.0 in | Wt 136.0 lb

## 2014-02-09 DIAGNOSIS — E119 Type 2 diabetes mellitus without complications: Secondary | ICD-10-CM

## 2014-02-09 DIAGNOSIS — M549 Dorsalgia, unspecified: Secondary | ICD-10-CM

## 2014-02-09 DIAGNOSIS — M949 Disorder of cartilage, unspecified: Secondary | ICD-10-CM

## 2014-02-09 DIAGNOSIS — Z0000 Encounter for general adult medical examination without abnormal findings: Secondary | ICD-10-CM

## 2014-02-09 DIAGNOSIS — Z23 Encounter for immunization: Secondary | ICD-10-CM

## 2014-02-09 DIAGNOSIS — F3174 Bipolar disorder, in full remission, most recent episode manic: Secondary | ICD-10-CM

## 2014-02-09 DIAGNOSIS — M899 Disorder of bone, unspecified: Secondary | ICD-10-CM

## 2014-02-09 LAB — CBC WITH DIFFERENTIAL/PLATELET
BASOS PCT: 0.2 % (ref 0.0–3.0)
Basophils Absolute: 0 10*3/uL (ref 0.0–0.1)
Eosinophils Absolute: 0.2 10*3/uL (ref 0.0–0.7)
Eosinophils Relative: 2.1 % (ref 0.0–5.0)
HCT: 34.9 % — ABNORMAL LOW (ref 36.0–46.0)
HEMOGLOBIN: 11.6 g/dL — AB (ref 12.0–15.0)
LYMPHS ABS: 2.1 10*3/uL (ref 0.7–4.0)
Lymphocytes Relative: 24.1 % (ref 12.0–46.0)
MCHC: 33.3 g/dL (ref 30.0–36.0)
MCV: 87.7 fl (ref 78.0–100.0)
MONO ABS: 0.4 10*3/uL (ref 0.1–1.0)
Monocytes Relative: 5.3 % (ref 3.0–12.0)
Neutro Abs: 5.8 10*3/uL (ref 1.4–7.7)
Neutrophils Relative %: 68.3 % (ref 43.0–77.0)
PLATELETS: 381 10*3/uL (ref 150.0–400.0)
RBC: 3.98 Mil/uL (ref 3.87–5.11)
RDW: 14.3 % (ref 11.5–15.5)
WBC: 8.5 10*3/uL (ref 4.0–10.5)

## 2014-02-09 LAB — COMPREHENSIVE METABOLIC PANEL
ALK PHOS: 84 U/L (ref 39–117)
ALT: 11 U/L (ref 0–35)
AST: 18 U/L (ref 0–37)
Albumin: 3.9 g/dL (ref 3.5–5.2)
BUN: 17 mg/dL (ref 6–23)
CO2: 29 mEq/L (ref 19–32)
CREATININE: 0.8 mg/dL (ref 0.4–1.2)
Calcium: 9.9 mg/dL (ref 8.4–10.5)
Chloride: 103 mEq/L (ref 96–112)
GFR: 79.01 mL/min (ref >60.00)
Glucose, Bld: 118 mg/dL — ABNORMAL HIGH (ref 70–99)
Potassium: 4.6 mEq/L (ref 3.5–5.1)
SODIUM: 140 meq/L (ref 135–145)
TOTAL PROTEIN: 7.9 g/dL (ref 6.0–8.3)
Total Bilirubin: 0.5 mg/dL (ref 0.2–1.2)

## 2014-02-09 LAB — HEMOGLOBIN A1C: HEMOGLOBIN A1C: 6.6 % — AB (ref 4.6–6.5)

## 2014-02-09 LAB — TSH: TSH: 1.18 u[IU]/mL (ref 0.35–4.50)

## 2014-02-09 LAB — LIPID PANEL
CHOL/HDL RATIO: 4
Cholesterol: 187 mg/dL (ref 0–200)
HDL: 48.6 mg/dL (ref >39.00)
LDL CALC: 99 mg/dL (ref 0–99)
Triglycerides: 195 mg/dL — ABNORMAL HIGH (ref 0.0–149.0)
VLDL: 39 mg/dL (ref 0.0–40.0)

## 2014-02-09 LAB — HM DIABETES FOOT EXAM

## 2014-02-09 LAB — T4, FREE: FREE T4: 0.89 ng/dL (ref 0.60–1.60)

## 2014-02-09 NOTE — Assessment & Plan Note (Signed)
Doing well on current regimen .  

## 2014-02-09 NOTE — Progress Notes (Signed)
Pre visit review using our clinic review tool, if applicable. No additional management support is needed unless otherwise documented below in the visit note. 

## 2014-02-09 NOTE — Assessment & Plan Note (Signed)
Will have her add vitamin D

## 2014-02-09 NOTE — Assessment & Plan Note (Signed)
Doing well on her regimen Will check glucose since high risk meds

## 2014-02-09 NOTE — Addendum Note (Signed)
Addended by: Sueanne Margarita on: 02/09/2014 01:01 PM   Modules accepted: Orders

## 2014-02-09 NOTE — Patient Instructions (Signed)
Please start vitamin D 1000 international units daily.  Diabetes and Exercise Exercising regularly is important. It is not just about losing weight. It has many health benefits, such as:  Improving your overall fitness, flexibility, and endurance.  Increasing your bone density.  Helping with weight control.  Decreasing your body fat.  Increasing your muscle strength.  Reducing stress and tension.  Improving your overall health. People with diabetes who exercise gain additional benefits because exercise:  Reduces appetite.  Improves the body's use of blood sugar (glucose).  Helps lower or control blood glucose.  Decreases blood pressure.  Helps control blood lipids (such as cholesterol and triglycerides).  Improves the body's use of the hormone insulin by:  Increasing the body's insulin sensitivity.  Reducing the body's insulin needs.  Decreases the risk for heart disease because exercising:  Lowers cholesterol and triglycerides levels.  Increases the levels of good cholesterol (such as high-density lipoproteins [HDL]) in the body.  Lowers blood glucose levels. YOUR ACTIVITY PLAN  Choose an activity that you enjoy and set realistic goals. Your health care provider or diabetes educator can help you make an activity plan that works for you. You can break activities into 2 or 3 sessions throughout the day. Doing so is as good as one long session. Exercise ideas include:  Taking the dog for a walk.  Taking the stairs instead of the elevator.  Dancing to your favorite song.  Doing your favorite exercise with a friend. RECOMMENDATIONS FOR EXERCISING WITH TYPE 1 OR TYPE 2 DIABETES   Check your blood glucose before exercising. If blood glucose levels are greater than 240 mg/dL, check for urine ketones. Do not exercise if ketones are present.  Avoid injecting insulin into areas of the body that are going to be exercised. For example, avoid injecting insulin into:  The  arms when playing tennis.  The legs when jogging.  Keep a record of:  Food intake before and after you exercise.  Expected peak times of insulin action.  Blood glucose levels before and after you exercise.  The type and amount of exercise you have done.  Review your records with your health care provider. Your health care provider will help you to develop guidelines for adjusting food intake and insulin amounts before and after exercising.  If you take insulin or oral hypoglycemic agents, watch for signs and symptoms of hypoglycemia. They include:  Dizziness.  Shaking.  Sweating.  Chills.  Confusion.  Drink plenty of water while you exercise to prevent dehydration or heat stroke. Body water is lost during exercise and must be replaced.  Talk to your health care provider before starting an exercise program to make sure it is safe for you. Remember, almost any type of activity is better than none. Document Released: 11/18/2003 Document Revised: 04/30/2013 Document Reviewed: 02/04/2013 [employer] Patient Information 2014 [place], [place].

## 2014-02-09 NOTE — Assessment & Plan Note (Signed)
Seems to be doing well Will check labs No foot numbness or findings Keeps up with ophtho

## 2014-02-09 NOTE — Progress Notes (Signed)
Subjective:    Patient ID: Diana Roberson, female    DOB: 25-May-1946, 68 y.o.   MRN: [id number]  HPI Here for Medicare wellness visit and follow up Only sees Dr French Ana regular due to glaucoma. No retinopathy Tries to walk---discussed strength and balance due to her stability fears. No falls though Hearing and vision are okay Independent with all instrumental ADLs---does hold on to banister when going on steps Occasionally has mild memory lapses-- things pop in later No tobacco or alcohol  No recent mania Depression still quiet---no mood issues Sleeps well  Not regular checking of sugars Needs to get new machine No low sugar reactions No foot sores or sensory changes  No major recent back pain Gets good relief from the gabapentin and lidocaine patch Avoids vaccuuming  Current Outpatient Prescriptions on File Prior to Visit  Medication Sig Dispense Refill  . amantadine (SYMMETREL) 100 MG capsule Take 1 capsule (100 mg total) by mouth 2 (two) times daily.  180 capsule  3  . aspirin 81 MG tablet Take 81 mg by mouth daily.        . calcium gluconate 500 MG tablet Take 500 mg by mouth daily.        Marland Kitchen gabapentin (NEURONTIN) 600 MG tablet Take 1 tablet (600 mg total) by mouth 3 (three) times daily.  270 tablet  3  . lidocaine (LIDODERM) 5 % Place 1 patch onto the skin daily. Remove & Discard patch within 12 hours or as directed by MD  90 patch  3  . metFORMIN (GLUCOPHAGE) 500 MG tablet Take 1 tablet (500 mg total) by mouth 2 (two) times daily with a meal.  180 tablet  3  . QUEtiapine (SEROQUEL) 50 MG tablet Take 1 tablet (50 mg total) by mouth at bedtime.  90 tablet  3  . vitamin B-12 (CYANOCOBALAMIN) 1000 MCG tablet Take 1,000 mcg by mouth daily.        . vitamin E 400 UNIT capsule Take 400 Units by mouth daily.        . ziprasidone (GEODON) 80 MG capsule Take 80-160 mg by mouth daily.        No current facility-administered medications on file prior to visit.     Allergies  Allergen Reactions  . Codeine Sulfate     Indigestion and pain  . Sertraline Hcl     REACTION: Worse, ? irritable    Past Medical History  Diagnosis Date  . Diabetes mellitus   . Diverticulitis   . Hyperlipidemia   . Chronic back pain   . Bipolar 1 disorder   . Osteopenia   . Mental health disorder     admitted x3    Past Surgical History  Procedure Laterality Date  . Back surgery  1988  . Cholecystectomy      Family History  Problem Relation Age of Onset  . Diabetes Mother   . Hypertension Mother   . Stroke Sister   . Hashimoto's thyroiditis Daughter   . Cancer Neg Hx     History   Social History  . Marital Status: Married    Spouse Name: N/A    Number of Children: 2  . Years of Education: N/A   Occupational History  . formerly Designer, jewellery for [employer]    Social History Main Topics  . Smoking status: Never Smoker   . Smokeless tobacco: Never Used  . Alcohol Use: No  . Drug Use: Not on file  . Sexual  Activity: Not on file   Other Topics Concern  . Not on file   Social History Narrative   Has living will   Husband is health care POA.   Would accept resuscitation attempts   Would not want feeding tube if cognitively unaware   Review of Systems Appetite is good Weight is stable Bowels are fine No voiding problems. No incontinence     Objective:   Physical Exam  Constitutional: She is oriented to person, place, and time. She appears well-developed and well-nourished. No distress.  HENT:  Mouth/Throat: Oropharynx is clear and moist. No oropharyngeal exudate.  Neck: Normal range of motion. Neck supple. No thyromegaly present.  Cardiovascular: Normal rate, regular rhythm, normal heart sounds and intact distal pulses.  Exam reveals no gallop.   No murmur heard. Pulmonary/Chest: Effort normal and breath sounds normal. No respiratory distress. She has no wheezes. She has no rales.  Abdominal: Soft. There is no  tenderness.  Genitourinary:  No breast masses or tenderness  Musculoskeletal: She exhibits no edema and no tenderness.  Lymphadenopathy:    She has no cervical adenopathy.    She has no axillary adenopathy.  Neurological: She is alert and oriented to person, place, and time.  President--- "Obama, GW Bush, Clinton" [phone] D-l-r-o-w Recall 3/3  Skin: No rash noted. No erythema.  Psychiatric: She has a normal mood and affect. Her behavior is normal.          Assessment & Plan:

## 2014-02-09 NOTE — Assessment & Plan Note (Signed)
I have personally reviewed the Medicare Annual Wellness questionnaire and have noted 1. The patient's medical and social history 2. Their use of alcohol, tobacco or illicit drugs 3. Their current medications and supplements 4. The patient's functional ability including ADL's, fall risks, home safety risks and hearing or visual             impairment. 5. Diet and physical activities 6. Evidence for depression or mood disorders  The patients weight, height, BMI and visual acuity have been recorded in the chart I have made referrals, counseling and provided education to the patient based review of the above and I have provided the pt with a written personalized care plan for preventive services.  I have provided you with a copy of your personalized plan for preventive services. Please take the time to review along with your updated medication list.  Doing well prevnar today Yearly flu shots mammo every other year---due next year Colon due 2018

## 2014-02-17 ENCOUNTER — Telehealth: Payer: Self-pay | Admitting: *Deleted

## 2014-02-17 NOTE — Telephone Encounter (Signed)
Pt was requesting an easy max glucometer, I advised pt this wasn't anything we could order, she stated she got it free off TV and will try and contact them.

## 2014-04-10 ENCOUNTER — Ambulatory Visit: Payer: Self-pay | Admitting: Internal Medicine

## 2014-04-13 ENCOUNTER — Encounter: Payer: Self-pay | Admitting: Internal Medicine

## 2014-04-16 ENCOUNTER — Ambulatory Visit: Payer: Self-pay | Admitting: Internal Medicine

## 2014-04-16 ENCOUNTER — Encounter: Payer: Self-pay | Admitting: Internal Medicine

## 2014-04-20 ENCOUNTER — Other Ambulatory Visit: Payer: Self-pay | Admitting: Internal Medicine

## 2014-04-22 ENCOUNTER — Other Ambulatory Visit: Payer: Self-pay | Admitting: Internal Medicine

## 2014-06-08 LAB — HM DIABETES EYE EXAM

## 2014-08-17 ENCOUNTER — Ambulatory Visit: Payer: Self-pay | Admitting: Internal Medicine

## 2014-09-17 ENCOUNTER — Other Ambulatory Visit: Payer: Self-pay

## 2014-09-17 MED ORDER — QUETIAPINE FUMARATE 50 MG PO TABS: 50.0000 mg | ORAL_TABLET | Freq: Every day | ORAL | Status: DC

## 2014-09-17 MED ORDER — GABAPENTIN 600 MG PO TABS: 600.0000 mg | ORAL_TABLET | Freq: Three times a day (TID) | ORAL | Status: DC

## 2014-09-17 MED ORDER — METFORMIN HCL 500 MG PO TABS: 500.0000 mg | ORAL_TABLET | Freq: Two times a day (BID) | ORAL | Status: DC

## 2014-09-17 NOTE — Telephone Encounter (Signed)
Left message on machine that rx is ready for pick-up, and it will be at our front desk.  

## 2014-09-17 NOTE — Telephone Encounter (Signed)
Pt is changing mail order pharmacies but does not have acct set up with pharmacy yet and request 90 day rx for gabapentin, metformin and seroquel printed. Pt request cb when rx are ready.pt last annual exam 02/09/2014.

## 2014-09-18 NOTE — Telephone Encounter (Signed)
Pt request rxs mailed to her verified home address; Geraldine Contras said OK to mail. Advised pt would mail rx to home address.

## 2014-10-20 DIAGNOSIS — H4011X2 Primary open-angle glaucoma, moderate stage: Secondary | ICD-10-CM | POA: Diagnosis not present

## 2014-11-05 DIAGNOSIS — H4011X2 Primary open-angle glaucoma, moderate stage: Secondary | ICD-10-CM | POA: Diagnosis not present

## 2014-11-20 ENCOUNTER — Other Ambulatory Visit: Payer: Self-pay

## 2014-11-20 NOTE — Telephone Encounter (Signed)
Pt request printed rx for Geodon for mailed to [facility]. Pt said Dr Alver Fisher, psychiatrist used to prescribe but pt does not see her anymore and request written rx to be mailed to [facility]. Pt last saw Dr Alphonsus Sias on 02/09/2014 and has CPX scheduled 04/2015.Please advise.  Pt said OK to wait until next  Week for answer and Pt request cb.

## 2014-11-21 NOTE — Telephone Encounter (Signed)
Okay to prepare 1 year Rx for me to sign

## 2014-11-23 MED ORDER — ZIPRASIDONE HCL 80 MG PO CAPS: 80.0000 mg | ORAL_CAPSULE | Freq: Every day | ORAL | Status: DC

## 2014-11-23 NOTE — Telephone Encounter (Signed)
Geodon rx printed for Dr Alphonsus Sias to sign per his order.

## 2014-11-23 NOTE — Telephone Encounter (Signed)
rx faxed manually to [facility]

## 2014-11-26 NOTE — Telephone Encounter (Signed)
Pt called to ck on status of prescription; advised was faxed to [facility] on 11/23/14. Pt will ck with pharmacy.

## 2015-01-11 DIAGNOSIS — B9689 Other specified bacterial agents as the cause of diseases classified elsewhere: Secondary | ICD-10-CM | POA: Diagnosis not present

## 2015-01-11 DIAGNOSIS — M899 Disorder of bone, unspecified: Secondary | ICD-10-CM | POA: Diagnosis not present

## 2015-01-11 DIAGNOSIS — J208 Acute bronchitis due to other specified organisms: Secondary | ICD-10-CM | POA: Diagnosis not present

## 2015-01-11 DIAGNOSIS — Z20828 Contact with and (suspected) exposure to other viral communicable diseases: Secondary | ICD-10-CM | POA: Diagnosis not present

## 2015-01-12 ENCOUNTER — Ambulatory Visit (INDEPENDENT_AMBULATORY_CARE_PROVIDER_SITE_OTHER): Payer: Medicare Other | Admitting: Internal Medicine

## 2015-01-12 ENCOUNTER — Encounter: Payer: Self-pay | Admitting: Internal Medicine

## 2015-01-12 ENCOUNTER — Ambulatory Visit (INDEPENDENT_AMBULATORY_CARE_PROVIDER_SITE_OTHER)
Admission: RE | Admit: 2015-01-12 | Discharge: 2015-01-12 | Disposition: A | Discharge status: Home / Self Care | Payer: Medicare Other | Source: Ambulatory Visit | Attending: Internal Medicine | Admitting: Internal Medicine

## 2015-01-12 ENCOUNTER — Other Ambulatory Visit: Payer: Self-pay | Admitting: Internal Medicine

## 2015-01-12 VITALS — BP 110/68 | HR 87 | Temp 97.5°F | Wt 132.0 lb

## 2015-01-12 DIAGNOSIS — R911 Solitary pulmonary nodule: Secondary | ICD-10-CM

## 2015-01-12 DIAGNOSIS — R05 Cough: Secondary | ICD-10-CM | POA: Diagnosis not present

## 2015-01-12 DIAGNOSIS — I517 Cardiomegaly: Secondary | ICD-10-CM | POA: Diagnosis not present

## 2015-01-12 DIAGNOSIS — J209 Acute bronchitis, unspecified: Secondary | ICD-10-CM | POA: Diagnosis not present

## 2015-01-12 NOTE — Progress Notes (Signed)
Pre visit review using our clinic review tool, if applicable. No additional management support is needed unless otherwise documented below in the visit note. 

## 2015-01-12 NOTE — Assessment & Plan Note (Signed)
It is reasonable to think that this is bacterial given the 2 week course Doxy is a good choice and she has tolerated it

## 2015-01-12 NOTE — Progress Notes (Signed)
Subjective:    Patient ID: Diana Roberson, female    DOB: 09-25-1945, 69 y.o.   MRN: [id number]  HPI Here for follow up from an urgent care visit Here with husband  Got bad cough--caught from husband who was diagnosed with pneumonia Started over 2 weeks ago Productive with green sputum No fever but increased headaches No sore throat--just 1 day and resolved  Started on doxycycline for acute bronchitis Had CXR which showed possible nodule (but may be nipple shadow)  May feel some better  Current Outpatient Prescriptions on File Prior to Visit  Medication Sig Dispense Refill  . amantadine (SYMMETREL) 100 MG capsule Take 1 capsule (100 mg total) by mouth 2 (two) times daily. 180 capsule 3  . aspirin 81 MG tablet Take 81 mg by mouth daily.      . calcium gluconate 500 MG tablet Take 500 mg by mouth daily.      . cholecalciferol (VITAMIN D) 1000 UNITS tablet Take 1,000 Units by mouth daily.    Marland Kitchen gabapentin (NEURONTIN) 600 MG tablet Take 1 tablet (600 mg total) by mouth 3 (three) times daily. 270 tablet 1  . lidocaine (LIDODERM) 5 % Place 1 patch onto the skin daily. Remove & Discard patch within 12 hours or as directed by MD 90 patch 3  . metFORMIN (GLUCOPHAGE) 500 MG tablet Take 1 tablet (500 mg total) by mouth 2 (two) times daily. 180 tablet 1  . QUEtiapine (SEROQUEL) 50 MG tablet Take 1 tablet (50 mg total) by mouth at bedtime. 90 tablet 1  . vitamin B-12 (CYANOCOBALAMIN) 1000 MCG tablet Take 1,000 mcg by mouth daily.      . vitamin E 400 UNIT capsule Take 400 Units by mouth daily.      . ziprasidone (GEODON) 80 MG capsule Take 1-2 capsules (80-160 mg total) by mouth daily. 180 capsule 1   No current facility-administered medications on file prior to visit.    Allergies  Allergen Reactions  . Codeine Sulfate     Indigestion and pain  . Sertraline Hcl     REACTION: Worse, ? irritable    Past Medical History  Diagnosis Date  . Diabetes mellitus   . Diverticulitis   .  Hyperlipidemia   . Chronic back pain   . Bipolar 1 disorder   . Osteopenia   . Mental health disorder     admitted x3    Past Surgical History  Procedure Laterality Date  . Back surgery  1988  . Cholecystectomy      Family History  Problem Relation Age of Onset  . Diabetes Mother   . Hypertension Mother   . Stroke Sister   . Hashimoto's thyroiditis Daughter   . Cancer Neg Hx     History   Social History  . Marital Status: Married    Spouse Name: N/A  . Number of Children: 2  . Years of Education: N/A   Occupational History  . formerly Designer, jewellery for [employer]    Social History Main Topics  . Smoking status: Never Smoker   . Smokeless tobacco: Never Used  . Alcohol Use: No  . Drug Use: Not on file  . Sexual Activity: Not on file   Other Topics Concern  . Not on file   Social History Narrative   Has living will   Husband is health care POA.   Would accept resuscitation attempts   Would not want feeding tube if cognitively unaware   Review  of Systems  No vomiting or diarrhea Appetite off the first week of illness but back to normal now     Objective:   Physical Exam  Constitutional: She appears well-developed and well-nourished. No distress.  HENT:  Mouth/Throat: Oropharynx is clear and moist. No oropharyngeal exudate.  Neck: Normal range of motion. Neck supple. No thyromegaly present.  Pulmonary/Chest: Effort normal. No respiratory distress. She has no wheezes. She has no rales.  Slight rhonchi on left  Lymphadenopathy:    She has no cervical adenopathy.          Assessment & Plan:

## 2015-01-12 NOTE — Assessment & Plan Note (Signed)
Reviewed x-ray report Will set up multiple views to see if this is just a nipple shadow

## 2015-01-15 ENCOUNTER — Encounter: Payer: Self-pay | Admitting: Internal Medicine

## 2015-01-21 DIAGNOSIS — H4011X2 Primary open-angle glaucoma, moderate stage: Secondary | ICD-10-CM | POA: Diagnosis not present

## 2015-01-26 ENCOUNTER — Ambulatory Visit
Admission: RE | Admit: 2015-01-26 | Discharge: 2015-01-26 | Disposition: A | Discharge status: Home / Self Care | Payer: Medicare Other | Source: Ambulatory Visit | Attending: Internal Medicine | Admitting: Internal Medicine

## 2015-01-26 DIAGNOSIS — R05 Cough: Secondary | ICD-10-CM | POA: Diagnosis not present

## 2015-01-26 DIAGNOSIS — R911 Solitary pulmonary nodule: Secondary | ICD-10-CM

## 2015-01-26 DIAGNOSIS — J984 Other disorders of lung: Secondary | ICD-10-CM | POA: Diagnosis not present

## 2015-01-26 HISTORY — DX: Unspecified asthma, uncomplicated: J45.909

## 2015-02-07 ENCOUNTER — Encounter: Payer: Self-pay | Admitting: Internal Medicine

## 2015-02-09 MED ORDER — LIDOCAINE 5 % EX PTCH: 1.0000 | MEDICATED_PATCH | CUTANEOUS | Status: DC

## 2015-02-16 ENCOUNTER — Encounter: Payer: Self-pay | Admitting: Internal Medicine

## 2015-03-08 ENCOUNTER — Other Ambulatory Visit: Payer: Self-pay

## 2015-03-16 ENCOUNTER — Other Ambulatory Visit: Payer: Self-pay | Admitting: Internal Medicine

## 2015-03-30 LAB — HM DIABETES EYE EXAM

## 2015-04-30 ENCOUNTER — Encounter: Payer: Self-pay | Admitting: Internal Medicine

## 2015-04-30 ENCOUNTER — Ambulatory Visit (INDEPENDENT_AMBULATORY_CARE_PROVIDER_SITE_OTHER): Payer: Medicare Other | Admitting: Internal Medicine

## 2015-04-30 VITALS — BP 128/80 | HR 88 | Temp 97.7°F | Ht 63.0 in | Wt 136.0 lb

## 2015-04-30 DIAGNOSIS — G8929 Other chronic pain: Secondary | ICD-10-CM

## 2015-04-30 DIAGNOSIS — H409 Unspecified glaucoma: Secondary | ICD-10-CM | POA: Diagnosis not present

## 2015-04-30 DIAGNOSIS — F3174 Bipolar disorder, in full remission, most recent episode manic: Secondary | ICD-10-CM | POA: Diagnosis not present

## 2015-04-30 DIAGNOSIS — Z0000 Encounter for general adult medical examination without abnormal findings: Secondary | ICD-10-CM

## 2015-04-30 DIAGNOSIS — M069 Rheumatoid arthritis, unspecified: Secondary | ICD-10-CM

## 2015-04-30 DIAGNOSIS — E119 Type 2 diabetes mellitus without complications: Secondary | ICD-10-CM | POA: Diagnosis not present

## 2015-04-30 DIAGNOSIS — M549 Dorsalgia, unspecified: Secondary | ICD-10-CM

## 2015-04-30 DIAGNOSIS — Z7189 Other specified counseling: Secondary | ICD-10-CM

## 2015-04-30 LAB — LIPID PANEL
CHOL/HDL RATIO: 4
Cholesterol: 164 mg/dL (ref 0–200)
HDL: 43.6 mg/dL (ref >39.00)
LDL Cholesterol: 90 mg/dL (ref 0–99)
NONHDL: 119.98
Triglycerides: 148 mg/dL (ref 0.0–149.0)
VLDL: 29.6 mg/dL (ref 0.0–40.0)

## 2015-04-30 LAB — COMPREHENSIVE METABOLIC PANEL
ALT: 13 U/L (ref 0–35)
AST: 16 U/L (ref 0–37)
Albumin: 4 g/dL (ref 3.5–5.2)
Alkaline Phosphatase: 80 U/L (ref 39–117)
BUN: 23 mg/dL (ref 6–23)
CHLORIDE: 104 meq/L (ref 96–112)
CO2: 28 meq/L (ref 19–32)
Calcium: 9.7 mg/dL (ref 8.4–10.5)
Creatinine, Ser: 0.76 mg/dL (ref 0.40–1.20)
GFR: 79.93 mL/min (ref >60.00)
GLUCOSE: 125 mg/dL — AB (ref 70–99)
POTASSIUM: 4.5 meq/L (ref 3.5–5.1)
SODIUM: 139 meq/L (ref 135–145)
TOTAL PROTEIN: 7.9 g/dL (ref 6.0–8.3)
Total Bilirubin: 0.3 mg/dL (ref 0.2–1.2)

## 2015-04-30 LAB — MICROALBUMIN / CREATININE URINE RATIO
CREATININE, U: 83.8 mg/dL
MICROALB UR: 1.7 mg/dL (ref 0.0–1.9)
MICROALB/CREAT RATIO: 2 mg/g (ref 0.0–30.0)

## 2015-04-30 LAB — CBC WITH DIFFERENTIAL/PLATELET
BASOS PCT: 0.4 % (ref 0.0–3.0)
Basophils Absolute: 0 10*3/uL (ref 0.0–0.1)
EOS PCT: 2 % (ref 0.0–5.0)
Eosinophils Absolute: 0.2 10*3/uL (ref 0.0–0.7)
HCT: 33.8 % — ABNORMAL LOW (ref 36.0–46.0)
Hemoglobin: 11.4 g/dL — ABNORMAL LOW (ref 12.0–15.0)
LYMPHS ABS: 1.9 10*3/uL (ref 0.7–4.0)
Lymphocytes Relative: 24.5 % (ref 12.0–46.0)
MCHC: 33.8 g/dL (ref 30.0–36.0)
MCV: 86.6 fl (ref 78.0–100.0)
MONO ABS: 0.5 10*3/uL (ref 0.1–1.0)
Monocytes Relative: 5.7 % (ref 3.0–12.0)
NEUTROS ABS: 5.3 10*3/uL (ref 1.4–7.7)
NEUTROS PCT: 67.4 % (ref 43.0–77.0)
Platelets: 371 10*3/uL (ref 150.0–400.0)
RBC: 3.9 Mil/uL (ref 3.87–5.11)
RDW: 15.1 % (ref 11.5–15.5)
WBC: 7.9 10*3/uL (ref 4.0–10.5)

## 2015-04-30 LAB — T4, FREE: FREE T4: 0.8 ng/dL (ref 0.60–1.60)

## 2015-04-30 LAB — C-REACTIVE PROTEIN: CRP: 1.1 mg/dL (ref 0.5–20.0)

## 2015-04-30 LAB — SEDIMENTATION RATE: SED RATE: 63 mm/h — AB (ref 0–22)

## 2015-04-30 LAB — HM DIABETES FOOT EXAM

## 2015-04-30 LAB — HEMOGLOBIN A1C: HEMOGLOBIN A1C: 6.5 % (ref 4.6–6.5)

## 2015-04-30 NOTE — Assessment & Plan Note (Signed)
I have personally reviewed the Medicare Annual Wellness questionnaire and have noted 1. The patient's medical and social history 2. Their use of alcohol, tobacco or illicit drugs 3. Their current medications and supplements 4. The patient's functional ability including ADL's, fall risks, home safety risks and hearing or visual             impairment. 5. Diet and physical activities 6. Evidence for depression or mood disorders  The patients weight, height, BMI and visual acuity have been recorded in the chart I have made referrals, counseling and provided education to the patient based review of the above and I have provided the pt with a written personalized care plan for preventive services.  I have provided you with a copy of your personalized plan for preventive services. Please take the time to review along with your updated medication list.  mammo due 8/17 Colon due 2018 Recommended yearly flu vaccine which she doesn't really want

## 2015-04-30 NOTE — Assessment & Plan Note (Signed)
Deformed hand but doesn't seem to be active Will just check some labs

## 2015-04-30 NOTE — Progress Notes (Signed)
Pre visit review using our clinic review tool, if applicable. No additional management support is needed unless otherwise documented below in the visit note. 

## 2015-04-30 NOTE — Assessment & Plan Note (Signed)
See social history 

## 2015-04-30 NOTE — Progress Notes (Signed)
Subjective:    Patient ID: Diana Roberson, female    DOB: 1946-04-17, 69 y.o.   MRN: [id number]  HPI Here for Medicare wellness and follow up of chronic medical conditions Reviewed form and advanced directives Reviewed other doctors No alcohol or tobacco No falls Needs help with instrumental ADLs due to the hand problems Tries to exercise regularly Vision and hearing are okay No significant cognitive changes  She feels well No new problems  Checks sugars once a month or so Usually 100-150 fasting No hypoglycemic reactions No pain or sores in feet Eye exam in July--[place]. Just glaucoma, no retinopathy  Mood has been fine No mania or depression No longer seeing psychiatrist  Having more back pain recently No longer on the lidoderm Uses the OTC lidocaine patch though--these do help Still takes the gabapentin  Feels her RA is more active again Pain in hands--mostly right No strength in that hand and sig ulnar deviation No meds for the pain--not that bad  Current Outpatient Prescriptions on File Prior to Visit  Medication Sig Dispense Refill  . amantadine (SYMMETREL) 100 MG capsule Take 1 capsule (100 mg total) by mouth 2 (two) times daily. 180 capsule 3  . aspirin 81 MG tablet Take 81 mg by mouth daily.      . calcium gluconate 500 MG tablet Take 500 mg by mouth daily.      . cholecalciferol (VITAMIN D) 1000 UNITS tablet Take 1,000 Units by mouth daily.    Marland Kitchen gabapentin (NEURONTIN) 600 MG tablet Take 1 tablet (600 mg total) by mouth 3 (three) times daily. 270 tablet 1  . lidocaine (LIDODERM) 5 % Place 1 patch onto the skin daily. Remove & Discard patch within 12 hours or as directed by MD 90 patch 3  . metFORMIN (GLUCOPHAGE) 500 MG tablet Take 1 tablet by mouth  twice a day 180 tablet 0  . QUEtiapine (SEROQUEL) 50 MG tablet Take 1 tablet by mouth at  bedtime 90 tablet 0  . vitamin B-12 (CYANOCOBALAMIN) 1000 MCG tablet Take 1,000 mcg by mouth daily.      . vitamin  E 400 UNIT capsule Take 400 Units by mouth daily.      . ziprasidone (GEODON) 80 MG capsule Take 1-2 capsules (80-160 mg total) by mouth daily. 180 capsule 1   No current facility-administered medications on file prior to visit.    Allergies  Allergen Reactions  . Codeine Sulfate     Indigestion and pain  . Sertraline Hcl     REACTION: Worse, ? irritable    Past Medical History  Diagnosis Date  . Diabetes mellitus   . Diverticulitis   . Hyperlipidemia   . Chronic back pain   . Bipolar 1 disorder   . Osteopenia   . Mental health disorder     admitted x3  . Asthma   . Rheumatoid arthritis     Past Surgical History  Procedure Laterality Date  . Back surgery  1988  . Cholecystectomy      Family History  Problem Relation Age of Onset  . Diabetes Mother   . Hypertension Mother   . Stroke Sister   . Hashimoto's thyroiditis Daughter   . Cancer Neg Hx     Social History   Social History  . Marital Status: Married    Spouse Name: N/A  . Number of Children: 2  . Years of Education: N/A   Occupational History  . formerly Designer, jewellery for [employer]  Social History Main Topics  . Smoking status: Never Smoker   . Smokeless tobacco: Never Used  . Alcohol Use: No  . Drug Use: Not on file  . Sexual Activity: Not on file   Other Topics Concern  . Not on file   Social History Narrative   Has living will   Husband is health care POA.   Would accept resuscitation attempts   Would not want feeding tube if cognitively unaware   Review of Systems Appetite is good Weight up 4# Sleeps well Wears seat belt Teeth are okay--regular with dentist No chest pain No palpitations or dyspnea May have some pain in feet also No skin issues No dizziness or syncope    Objective:   Physical Exam  Constitutional: She is oriented to person, place, and time. She appears well-developed and well-nourished. No distress.  HENT:  Mouth/Throat: Oropharynx is clear  and moist. No oropharyngeal exudate.  Neck: Normal range of motion. Neck supple. No thyromegaly present.  Cardiovascular: Normal rate, regular rhythm, normal heart sounds and intact distal pulses.  Exam reveals no gallop.   No murmur heard. Pulmonary/Chest: Effort normal and breath sounds normal. No respiratory distress. She has no wheezes. She has no rales.  Abdominal: Soft. There is no tenderness.  Musculoskeletal: She exhibits no edema.  Ulnar deviation in fingers on right without clear synovitis or swelling  Lymphadenopathy:    She has no cervical adenopathy.  Neurological: She is alert and oriented to person, place, and time.  President -- "Obama, Bush, Clinton" [phone] D-l-r-o-w Recall 3/3  Normal sensation in plantar feet  Skin:  Several hammertoes and mild plantar callous on feet. No ulcers  Psychiatric: She has a normal mood and affect. Her behavior is normal.          Assessment & Plan:

## 2015-04-30 NOTE — Assessment & Plan Note (Signed)
Not severe or really limiting No changes needed

## 2015-04-30 NOTE — Assessment & Plan Note (Signed)
Still on drops from Dr Inez Pilgrim

## 2015-04-30 NOTE — Assessment & Plan Note (Signed)
Will continue all her Rx for now Consider wean one of the mood stabilizers over time

## 2015-04-30 NOTE — Assessment & Plan Note (Signed)
Hopefully still good overall control

## 2015-05-01 LAB — RHEUMATOID FACTOR: Rhuematoid fact SerPl-aCnc: 204 IU/mL — ABNORMAL HIGH (ref <=14)

## 2015-05-21 DIAGNOSIS — H4011X2 Primary open-angle glaucoma, moderate stage: Secondary | ICD-10-CM | POA: Diagnosis not present

## 2015-05-25 DIAGNOSIS — H4011X2 Primary open-angle glaucoma, moderate stage: Secondary | ICD-10-CM | POA: Diagnosis not present

## 2015-05-25 LAB — HM DIABETES EYE EXAM

## 2015-06-01 ENCOUNTER — Encounter: Payer: Self-pay | Admitting: Internal Medicine

## 2015-06-03 ENCOUNTER — Other Ambulatory Visit: Payer: Self-pay | Admitting: Internal Medicine

## 2015-06-24 DIAGNOSIS — H401132 Primary open-angle glaucoma, bilateral, moderate stage: Secondary | ICD-10-CM | POA: Diagnosis not present

## 2015-07-05 DIAGNOSIS — H34831 Tributary (branch) retinal vein occlusion, right eye, with macular edema: Secondary | ICD-10-CM | POA: Diagnosis not present

## 2015-07-05 DIAGNOSIS — H35033 Hypertensive retinopathy, bilateral: Secondary | ICD-10-CM | POA: Diagnosis not present

## 2015-07-05 LAB — HM DIABETES EYE EXAM

## 2015-07-08 ENCOUNTER — Encounter: Payer: Self-pay | Admitting: Internal Medicine

## 2015-08-11 ENCOUNTER — Other Ambulatory Visit: Payer: Self-pay | Admitting: Internal Medicine

## 2015-08-16 DIAGNOSIS — H34831 Tributary (branch) retinal vein occlusion, right eye, with macular edema: Secondary | ICD-10-CM | POA: Diagnosis not present

## 2015-10-06 DIAGNOSIS — H34831 Tributary (branch) retinal vein occlusion, right eye, with macular edema: Secondary | ICD-10-CM | POA: Diagnosis not present

## 2015-10-26 DIAGNOSIS — H401132 Primary open-angle glaucoma, bilateral, moderate stage: Secondary | ICD-10-CM | POA: Diagnosis not present

## 2015-11-05 ENCOUNTER — Ambulatory Visit (INDEPENDENT_AMBULATORY_CARE_PROVIDER_SITE_OTHER): Payer: Medicare Other | Admitting: Internal Medicine

## 2015-11-05 ENCOUNTER — Encounter: Payer: Self-pay | Admitting: Internal Medicine

## 2015-11-05 ENCOUNTER — Ambulatory Visit: Payer: Medicare Other | Admitting: Internal Medicine

## 2015-11-05 VITALS — BP 118/80 | HR 88 | Temp 97.6°F | Wt 136.0 lb

## 2015-11-05 DIAGNOSIS — E119 Type 2 diabetes mellitus without complications: Secondary | ICD-10-CM

## 2015-11-05 DIAGNOSIS — F3174 Bipolar disorder, in full remission, most recent episode manic: Secondary | ICD-10-CM | POA: Diagnosis not present

## 2015-11-05 DIAGNOSIS — M05742 Rheumatoid arthritis with rheumatoid factor of left hand without organ or systems involvement: Secondary | ICD-10-CM | POA: Diagnosis not present

## 2015-11-05 DIAGNOSIS — M549 Dorsalgia, unspecified: Secondary | ICD-10-CM | POA: Diagnosis not present

## 2015-11-05 DIAGNOSIS — G8929 Other chronic pain: Secondary | ICD-10-CM

## 2015-11-05 DIAGNOSIS — M05741 Rheumatoid arthritis with rheumatoid factor of right hand without organ or systems involvement: Secondary | ICD-10-CM | POA: Diagnosis not present

## 2015-11-05 LAB — SEDIMENTATION RATE: Sed Rate: 55 mm/hr — ABNORMAL HIGH (ref 0–22)

## 2015-11-05 LAB — HEMOGLOBIN A1C: Hgb A1c MFr Bld: 6.7 % — ABNORMAL HIGH (ref 4.6–6.5)

## 2015-11-05 LAB — C-REACTIVE PROTEIN: CRP: 0.6 mg/dL (ref 0.5–20.0)

## 2015-11-05 NOTE — Assessment & Plan Note (Signed)
Ongoing but no worse

## 2015-11-05 NOTE — Progress Notes (Signed)
Pre visit review using our clinic review tool, if applicable. No additional management support is needed unless otherwise documented below in the visit note. 

## 2015-11-05 NOTE — Assessment & Plan Note (Signed)
Controlled with her regimen No change indicated

## 2015-11-05 NOTE — Assessment & Plan Note (Signed)
Still seems to have good control Will check A1c 

## 2015-11-05 NOTE — Assessment & Plan Note (Signed)
Clinically seems to be burned out--but high sed rate Will recheck inflammatory markers---start MTX 15mg  if still high--then 2 month follow up

## 2015-11-05 NOTE — Progress Notes (Signed)
Subjective:    Patient ID: Diana Roberson, female    DOB: 01-16-1946, 70 y.o.   MRN: [id number]  HPI Here for follow up of diabetes and other chronic health conditions  Told by NPs from [facility] that she needs methotrexate Only some mild right wrist pain---better now No swelling or pain in the joints  Checks sugars once a month or so Usually 110-120 fasting No hypoglycemic reactions No foot sores or numbness/pain  Mood has been fine No mania, irritability or depression No longer seeing psychiatrist  Ongoing back pain Hard to sit for any extended time Reclines in chair Using OTC lidocaine patch  Current Outpatient Prescriptions on File Prior to Visit  Medication Sig Dispense Refill  . aspirin 81 MG tablet Take 81 mg by mouth daily.      . calcium gluconate 500 MG tablet Take 500 mg by mouth daily.      . cholecalciferol (VITAMIN D) 1000 UNITS tablet Take 1,000 Units by mouth daily.    Marland Kitchen gabapentin (NEURONTIN) 600 MG tablet Take 1 tablet by mouth 3  times daily 270 tablet 3  . lidocaine (LIDODERM) 5 % Place 1 patch onto the skin daily. Remove & Discard patch within 12 hours or as directed by MD 90 patch 3  . metFORMIN (GLUCOPHAGE) 500 MG tablet Take 1 tablet by mouth  twice a day 180 tablet 3  . QUEtiapine (SEROQUEL) 50 MG tablet Take 1 tablet by mouth at  bedtime 90 tablet 3  . vitamin B-12 (CYANOCOBALAMIN) 1000 MCG tablet Take 1,000 mcg by mouth daily.      . vitamin E 400 UNIT capsule Take 400 Units by mouth daily.      . ziprasidone (GEODON) 80 MG capsule Take 1 to 2 capsules by  mouth daily 180 capsule 3   No current facility-administered medications on file prior to visit.    Allergies  Allergen Reactions  . Codeine Sulfate     Indigestion and pain  . Sertraline Hcl     REACTION: Worse, ? irritable    Past Medical History  Diagnosis Date  . Diabetes mellitus   . Diverticulitis   . Hyperlipidemia   . Chronic back pain   . Bipolar 1 disorder (HCC)   .  Osteopenia   . Mental health disorder     admitted x3  . Asthma   . Rheumatoid arthritis [facility])     Past Surgical History  Procedure Laterality Date  . Back surgery  1988  . Cholecystectomy      Family History  Problem Relation Age of Onset  . Diabetes Mother   . Hypertension Mother   . Stroke Sister   . Hashimoto's thyroiditis Daughter   . Cancer Neg Hx     Social History   Social History  . Marital Status: Married    Spouse Name: N/A  . Number of Children: 2  . Years of Education: N/A   Occupational History  . formerly Designer, jewellery for [employer]    Social History Main Topics  . Smoking status: Never Smoker   . Smokeless tobacco: Never Used  . Alcohol Use: No  . Drug Use: Not on file  . Sexual Activity: Not on file   Other Topics Concern  . Not on file   Social History Narrative   Has living will   Husband is health care POA.   Would accept resuscitation attempts   Would not want feeding tube if cognitively unaware  Review of Systems Appetite is fine Weight down slightly--trying to  Sleeps okay    Objective:   Physical Exam  Constitutional: She appears well-developed and well-nourished. No distress.  Neck: Normal range of motion. Neck supple. No thyromegaly present.  Cardiovascular: Normal rate, regular rhythm, normal heart sounds and intact distal pulses.  Exam reveals no gallop.   No murmur heard. Pulmonary/Chest: Effort normal and breath sounds normal. No respiratory distress. She has no wheezes. She has no rales.  Musculoskeletal: She exhibits no edema.  Marked ulnar deviation in hands No clear synovitis though  Lymphadenopathy:    She has no cervical adenopathy.  Skin:  No foot lesions          Assessment & Plan:

## 2015-11-23 ENCOUNTER — Telehealth: Payer: Self-pay | Admitting: Internal Medicine

## 2015-11-23 DIAGNOSIS — E2839 Other primary ovarian failure: Secondary | ICD-10-CM

## 2015-11-23 NOTE — Telephone Encounter (Signed)
Please provide new orders/referral for bone density

## 2015-11-23 NOTE — Telephone Encounter (Signed)
Patient received a my chart message that she'd due for a bone density.  Patient doesn't remember where she had her last bone density done.  She said it was 2 or 3 years ago.  Patient would like appointment at the end of May.

## 2015-11-24 NOTE — Telephone Encounter (Signed)
It looks like  I ordered a DEXA back in 2014--can we still schedule using that order?

## 2015-11-25 NOTE — Telephone Encounter (Signed)
No, you need to place a new order, please order Bone Density.

## 2015-11-27 NOTE — Telephone Encounter (Signed)
Order placed

## 2015-12-01 DIAGNOSIS — H34831 Tributary (branch) retinal vein occlusion, right eye, with macular edema: Secondary | ICD-10-CM | POA: Diagnosis not present

## 2015-12-06 ENCOUNTER — Other Ambulatory Visit: Payer: Self-pay | Admitting: Internal Medicine

## 2015-12-06 DIAGNOSIS — Z1231 Encounter for screening mammogram for malignant neoplasm of breast: Secondary | ICD-10-CM

## 2015-12-20 ENCOUNTER — Ambulatory Visit (INDEPENDENT_AMBULATORY_CARE_PROVIDER_SITE_OTHER): Payer: Medicare Other | Admitting: Family Medicine

## 2015-12-20 ENCOUNTER — Encounter: Payer: Self-pay | Admitting: Family Medicine

## 2015-12-20 VITALS — BP 140/62 | HR 80 | Temp 98.6°F | Resp 16 | Ht 63.0 in | Wt 137.4 lb

## 2015-12-20 DIAGNOSIS — E119 Type 2 diabetes mellitus without complications: Secondary | ICD-10-CM | POA: Diagnosis not present

## 2015-12-20 DIAGNOSIS — Z7189 Other specified counseling: Secondary | ICD-10-CM

## 2015-12-20 DIAGNOSIS — F3174 Bipolar disorder, in full remission, most recent episode manic: Secondary | ICD-10-CM

## 2015-12-20 DIAGNOSIS — M549 Dorsalgia, unspecified: Secondary | ICD-10-CM

## 2015-12-20 DIAGNOSIS — M05742 Rheumatoid arthritis with rheumatoid factor of left hand without organ or systems involvement: Secondary | ICD-10-CM | POA: Diagnosis not present

## 2015-12-20 DIAGNOSIS — H409 Unspecified glaucoma: Secondary | ICD-10-CM

## 2015-12-20 DIAGNOSIS — M05741 Rheumatoid arthritis with rheumatoid factor of right hand without organ or systems involvement: Secondary | ICD-10-CM | POA: Diagnosis not present

## 2015-12-20 DIAGNOSIS — Z7689 Persons encountering health services in other specified circumstances: Secondary | ICD-10-CM

## 2015-12-20 DIAGNOSIS — G8929 Other chronic pain: Secondary | ICD-10-CM | POA: Diagnosis not present

## 2015-12-20 MED ORDER — LISINOPRIL 2.5 MG PO TABS: 2.5000 mg | ORAL_TABLET | Freq: Every day | ORAL | Status: DC

## 2015-12-20 MED ORDER — BACLOFEN 10 MG PO TABS: 10.0000 mg | ORAL_TABLET | Freq: Three times a day (TID) | ORAL | Status: DC

## 2015-12-20 MED ORDER — SIMVASTATIN 20 MG PO TABS: 20.0000 mg | ORAL_TABLET | Freq: Every day | ORAL | Status: DC

## 2015-12-20 NOTE — Progress Notes (Signed)
Subjective:    Patient ID: Diana Roberson, female    DOB: December 20, 1945, 70 y.o.   MRN: [id number]  HPI: Diana Roberson is a 70 y.o. female presenting on 12/20/2015 for Back Pain   HPI  Pt presents to establish care today. Previous care provider was [employer].  It has been 2 months since His last PCP visit. Records from previous provider will be requested and reviewed. Current medical problems include:  Diabetes: Diagnosed 10 years ago. Currently taking metformin BID . Last A1c was 6.7%. Eye exam. Was previously prescribed lipitor- missed her turn twice= MD took her off.  [place] [facility] [employer] [facility]. No numbness or tingling in feet.  Glaucoma: Diagnosed 2.5 ears ago. Takes eye drops daily, seen at [facility]. Back pain: Started in 1988- has sciaticia- L sideded.  Currently taking neurontin- isn't helping. Previously used lidocaine patches. Uses asper cream. Low back pain, radiates to knee. Tried PT years ago. Taken naproxen before- not as helpful. Took flexeril- it was her granddaughters. Pain occurs mostly when sitting.  Rhematoid Arthritis: Not currently being treated. Diagnosed 2 years ago. No pain but swan neck deformities both hands. Losing use of hands.  Bipolar disorder- Taking Geodon and Seroquel- was being managed by her PCP, previously seeing psychiatry.  No depressive symptoms. Last episode of mania was several years go. Previous psychiatric [facility].   Health maintenance:  Pap smear- 12/2014- all normal.  Bone density test on May 24 Mammogram May 24- previous mammograms have been normal.    Past Medical History  Diagnosis Date  . Diabetes mellitus   . Diverticulitis   . Hyperlipidemia   . Chronic back pain   . Bipolar 1 disorder (HCC)   . Osteopenia   . Mental health disorder     admitted x3  . Asthma   . Rheumatoid arthritis (HCC)   . Allergy   . Glaucoma    Social History   Social History  . Marital Status: Married     Spouse Name: N/A  . Number of Children: 2  . Years of Education: N/A   Occupational History  . formerly Designer, jewellery for [employer]    Social History Main Topics  . Smoking status: Never Smoker   . Smokeless tobacco: Never Used  . Alcohol Use: No  . Drug Use: No  . Sexual Activity: Not on file   Other Topics Concern  . Not on file   Social History Narrative   Has living will   Husband is health care POA.   Would accept resuscitation attempts   Would not want feeding tube if cognitively unaware   Family History  Problem Relation Age of Onset  . Diabetes Mother   . Hypertension Mother   . Stroke Sister   . Hashimoto's thyroiditis Daughter   . Cancer Neg Hx   . Stroke Father   . Stroke Sister    Current Outpatient Prescriptions on File Prior to Visit  Medication Sig  . aspirin 81 MG tablet Take 81 mg by mouth daily.    . calcium gluconate 500 MG tablet Take 500 mg by mouth daily.    . cholecalciferol (VITAMIN D) 1000 UNITS tablet Take 1,000 Units by mouth daily.  Marland Kitchen gabapentin (NEURONTIN) 600 MG tablet Take 1 tablet by mouth 3  times daily  . lidocaine (LIDODERM) 5 % Place 1 patch onto the skin daily. Remove & Discard patch within 12 hours or as directed  by MD  . metFORMIN (GLUCOPHAGE) 500 MG tablet Take 1 tablet by mouth  twice a day  . QUEtiapine (SEROQUEL) 50 MG tablet Take 1 tablet by mouth at  bedtime  . vitamin B-12 (CYANOCOBALAMIN) 1000 MCG tablet Take 1,000 mcg by mouth daily.    . vitamin E 400 UNIT capsule Take 400 Units by mouth daily.    . ziprasidone (GEODON) 80 MG capsule Take 1 to 2 capsules by  mouth daily   No current facility-administered medications on file prior to visit.    Review of Systems  Constitutional: Negative for fever and chills.  HENT: Negative.   Respiratory: Negative for cough, chest tightness and wheezing.   Cardiovascular: Negative for chest pain and leg swelling.  Gastrointestinal: Negative for nausea, vomiting,  abdominal pain, diarrhea and constipation.  Endocrine: Negative.  Negative for cold intolerance, heat intolerance, polydipsia, polyphagia and polyuria.  Genitourinary: Negative for dysuria and difficulty urinating.  Musculoskeletal: Positive for back pain and joint swelling. Negative for arthralgias.  Neurological: Negative for dizziness, light-headedness and numbness.  Psychiatric/Behavioral: Negative.  Negative for confusion, dysphoric mood and decreased concentration. The patient is not nervous/anxious.    Per HPI unless specifically indicated above     Objective:    BP 140/62 mmHg  Pulse 80  Temp(Src) 98.6 F (37 C) (Oral)  Resp 16  Ht 5\' 3"  (1.6 m)  Wt 137 lb 6.4 oz (62.324 kg)  BMI 24.35 kg/m2  Wt Readings from Last 3 Encounters:  12/20/15 137 lb 6.4 oz (62.324 kg)  11/05/15 136 lb (61.689 kg)  04/30/15 136 lb (61.689 kg)    Diabetic Foot Exam - Simple   Simple Foot Form  Diabetic Foot exam was performed with the following findings:  Yes 12/20/2015  3:08 PM  Visual Inspection  See comments:  Yes  Sensation Testing  Intact to touch and monofilament testing bilaterally:  Yes  Pulse Check  Posterior Tibialis and Dorsalis pulse intact bilaterally:  Yes  Comments  Toe deformities due to RA.      Physical Exam  Constitutional: She is oriented to person, place, and time. She appears well-developed and well-nourished.  HENT:  Head: Normocephalic and atraumatic.  Neck: Neck supple.  Cardiovascular: Normal rate, regular rhythm and normal heart sounds.  Exam reveals no gallop and no friction rub.   No murmur heard. Pulmonary/Chest: Effort normal and breath sounds normal. She has no wheezes. She exhibits no tenderness.  Abdominal: Soft. Normal appearance and bowel sounds are normal. She exhibits no distension and no mass. There is no tenderness. There is no rebound and no guarding.  Musculoskeletal: She exhibits no edema or tenderness.       Lumbar back: She exhibits  decreased range of motion (due to pain when twisting. ). She exhibits no tenderness, no swelling, no pain and no spasm.       Right hand: She exhibits deformity.       Left hand: She exhibits deformity.       Right foot: There is deformity (toes curled under and deviated to the side. ).       Left foot: There is deformity (toes curled under bilateral. ).  Swan neck deformity bilateral hands.  Negative straight leg raises.    Lymphadenopathy:    She has no cervical adenopathy.  Neurological: She is alert and oriented to person, place, and time.  Skin: Skin is warm and dry.   Results for orders placed or performed in visit on 11/05/15  C-reactive protein  Result Value Ref Range   CRP 0.6 0.5 - 20.0 mg/dL  Sedimentation rate  Result Value Ref Range   Sed Rate 55 (H) 0 - 22 mm/hr  Hemoglobin A1c  Result Value Ref Range   Hgb A1c MFr Bld 6.7 (H) 4.6 - 6.5 %      Assessment & Plan:   Problem List Items Addressed This Visit      Endocrine   Diabetes mellitus type 2, controlled, without complications (HCC)    Controlled. Continue current regimen at this time. Start ACE for renal protection. Eye exam UTD. Foot exam done.  Urine microalbumin done 04/2015 A1c due 5/24      Relevant Medications   lisinopril (PRINIVIL,ZESTRIL) 2.5 MG tablet   simvastatin (ZOCOR) 20 MG tablet   Other Relevant Orders   Comprehensive metabolic panel   Lipid Profile     Musculoskeletal and Integument   Rheumatoid arthritis (HCC)    Referral to rheumatology to determine if medications are needed to prevent further joint destruction. Significant swan neck deformities both hands. Starting to have issues with her toes.       Relevant Medications   baclofen (LIORESAL) 10 MG tablet   Other Relevant Orders   Ambulatory referral to Rheumatology     Other   Bipolar 1 disorder, manic, full remission (HCC)    Previous PCP managing medications. Controlled with her current regimen. No changes today. Pt to  psychiatry with manic episodes.       Chronic back pain    Continue current lidocaine patches and asper cream. Trial of baclofen to help with pain. Consider PT in the future if continues.       Relevant Medications   baclofen (LIORESAL) 10 MG tablet   Glaucoma    Managed by opthalmology.        Other Visit Diagnoses    Encounter to establish care    -  Primary       Meds ordered this encounter  Medications  . DISCONTD: lisinopril (PRINIVIL,ZESTRIL) 2.5 MG tablet    Sig: Take 1 tablet (2.5 mg total) by mouth daily.    Dispense:  30 tablet    Refill:  11    Order Specific Question:  Supervising Provider    Answer:  Janeann Forehand [phone]  . DISCONTD: simvastatin (ZOCOR) 20 MG tablet    Sig: Take 1 tablet (20 mg total) by mouth at bedtime.    Dispense:  30 tablet    Refill:  3    Order Specific Question:  Supervising Provider    Answer:  Janeann Forehand [phone]  . DISCONTD: baclofen (LIORESAL) 10 MG tablet    Sig: Take 1 tablet (10 mg total) by mouth 3 (three) times daily.    Dispense:  30 each    Refill:  3    Order Specific Question:  Supervising Provider    Answer:  Janeann Forehand [phone]  . lisinopril (PRINIVIL,ZESTRIL) 2.5 MG tablet    Sig: Take 1 tablet (2.5 mg total) by mouth daily.    Dispense:  30 tablet    Refill:  11    Order Specific Question:  Supervising Provider    Answer:  Janeann Forehand [phone]  . baclofen (LIORESAL) 10 MG tablet    Sig: Take 1 tablet (10 mg total) by mouth 3 (three) times daily.    Dispense:  30 each    Refill:  3    Order  Specific Question:  Supervising Provider    Answer:  Janeann Forehand [[id number]]  . simvastatin (ZOCOR) 20 MG tablet    Sig: Take 1 tablet (20 mg total) by mouth at bedtime.    Dispense:  30 tablet    Refill:  3    Order Specific Question:  Supervising Provider    Answer:  Janeann Forehand [[id number]]      Follow up plan: Return in about 1 month (around 01/19/2016) for BP, back pain.  Marland Kitchen

## 2015-12-20 NOTE — Assessment & Plan Note (Signed)
Continue current lidocaine patches and asper cream. Trial of baclofen to help with pain. Consider PT in the future if continues.

## 2015-12-20 NOTE — Assessment & Plan Note (Signed)
Controlled. Continue current regimen at this time. Start ACE for renal protection. Eye exam UTD. Foot exam done.  Urine microalbumin done 04/2015 A1c due 5/24

## 2015-12-20 NOTE — Patient Instructions (Signed)
We discussed potential pathology and long term risk of reoccurrence. Maintaining an ideal body habitus, regular exercise, proper lifting techniques and mindfulness of exacerbating factors will be useful in long term management.  Instructed on use of heating pad with exercises. Consider concomitant therapy with PT, massage therapist or chiropractor. May use anti-inflammatory medication and muscle relaxer as needed.  If you develop progressive numbness, weakness, loss of feeling in your pelvis, or loss of bowel/bladder control please seek immediate medical attention.    We will have you see a joint specialist about your Rheumatoid arthritis.

## 2015-12-20 NOTE — Assessment & Plan Note (Signed)
Referral to rheumatology to determine if medications are needed to prevent further joint destruction. Significant swan neck deformities both hands. Starting to have issues with her toes.

## 2015-12-20 NOTE — Assessment & Plan Note (Signed)
Managed by opthalmology 

## 2015-12-20 NOTE — Assessment & Plan Note (Signed)
Previous PCP managing medications. Controlled with her current regimen. No changes today. Pt to psychiatry with manic episodes.

## 2015-12-21 ENCOUNTER — Telehealth: Payer: Self-pay | Admitting: Family Medicine

## 2015-12-21 DIAGNOSIS — G8929 Other chronic pain: Secondary | ICD-10-CM

## 2015-12-21 DIAGNOSIS — M549 Dorsalgia, unspecified: Principal | ICD-10-CM

## 2015-12-21 MED ORDER — CYCLOBENZAPRINE HCL 10 MG PO TABS: 10.0000 mg | ORAL_TABLET | Freq: Three times a day (TID) | ORAL | Status: DC | PRN

## 2015-12-21 NOTE — Telephone Encounter (Signed)
Pt called states that the baclofen was not working her pain was at a level 7 she was requesting flexural 10 mg states that if insurance did not pay she would pay out of pocket . Pt call back # is  [phone]

## 2015-12-21 NOTE — Telephone Encounter (Signed)
Sent. Please alert patient.

## 2015-12-21 NOTE — Telephone Encounter (Signed)
Patient aware.[place] 

## 2015-12-28 DIAGNOSIS — R700 Elevated erythrocyte sedimentation rate: Secondary | ICD-10-CM | POA: Diagnosis not present

## 2015-12-28 DIAGNOSIS — R079 Chest pain, unspecified: Secondary | ICD-10-CM | POA: Diagnosis not present

## 2015-12-28 DIAGNOSIS — M06872 Other specified rheumatoid arthritis, left ankle and foot: Secondary | ICD-10-CM | POA: Diagnosis not present

## 2015-12-28 DIAGNOSIS — M059 Rheumatoid arthritis with rheumatoid factor, unspecified: Secondary | ICD-10-CM | POA: Insufficient documentation

## 2015-12-28 DIAGNOSIS — E119 Type 2 diabetes mellitus without complications: Secondary | ICD-10-CM | POA: Diagnosis not present

## 2015-12-28 DIAGNOSIS — Z79899 Other long term (current) drug therapy: Secondary | ICD-10-CM | POA: Diagnosis not present

## 2015-12-28 DIAGNOSIS — M19032 Primary osteoarthritis, left wrist: Secondary | ICD-10-CM | POA: Diagnosis not present

## 2015-12-28 DIAGNOSIS — E1165 Type 2 diabetes mellitus with hyperglycemia: Secondary | ICD-10-CM | POA: Insufficient documentation

## 2015-12-28 DIAGNOSIS — E1159 Type 2 diabetes mellitus with other circulatory complications: Secondary | ICD-10-CM | POA: Insufficient documentation

## 2015-12-28 DIAGNOSIS — M06871 Other specified rheumatoid arthritis, right ankle and foot: Secondary | ICD-10-CM | POA: Diagnosis not present

## 2015-12-28 DIAGNOSIS — M06841 Other specified rheumatoid arthritis, right hand: Secondary | ICD-10-CM | POA: Diagnosis not present

## 2015-12-28 DIAGNOSIS — M06842 Other specified rheumatoid arthritis, left hand: Secondary | ICD-10-CM | POA: Diagnosis not present

## 2015-12-28 DIAGNOSIS — M06831 Other specified rheumatoid arthritis, right wrist: Secondary | ICD-10-CM | POA: Diagnosis not present

## 2015-12-31 ENCOUNTER — Telehealth: Payer: Self-pay | Admitting: Family Medicine

## 2015-12-31 NOTE — Telephone Encounter (Signed)
Patient aware as she has already called [facility].

## 2015-12-31 NOTE — Telephone Encounter (Signed)
Pt was prescribed baclofen but it wasn't helping so Amy started her on flexeril.  It's working great.  Her mail order pharmacy just sent her another refill of baclofen.  She wanted to make sure Amy didn't write the prescription for 6 months.  It doesn't cost much but it doesn't help.  Her call back number is [phone]

## 2015-12-31 NOTE — Telephone Encounter (Signed)
That was my mistake- I think it was accidentally sent to her mail order pharmacy on the day it was written.  I wrote for 3 refills.  She can call [facility] and ask them not to send it anymore.  Thanks! AK

## 2016-01-12 ENCOUNTER — Other Ambulatory Visit: Payer: Self-pay | Admitting: Family Medicine

## 2016-01-17 DIAGNOSIS — E119 Type 2 diabetes mellitus without complications: Secondary | ICD-10-CM | POA: Diagnosis not present

## 2016-01-18 LAB — LIPID PANEL
Chol/HDL Ratio: 4.6 ratio units — ABNORMAL HIGH (ref 0.0–4.4)
Cholesterol, Total: 205 mg/dL — ABNORMAL HIGH (ref 100–199)
HDL: 45 mg/dL (ref >39)
LDL Calculated: 129 mg/dL — ABNORMAL HIGH (ref 0–99)
Triglycerides: 153 mg/dL — ABNORMAL HIGH (ref 0–149)
VLDL CHOLESTEROL CAL: 31 mg/dL (ref 5–40)

## 2016-01-18 LAB — COMPREHENSIVE METABOLIC PANEL
A/G RATIO: 1.2 (ref 1.2–2.2)
ALBUMIN: 4.2 g/dL (ref 3.5–4.8)
ALK PHOS: 93 IU/L (ref 39–117)
ALT: 10 IU/L (ref 0–32)
AST: 14 IU/L (ref 0–40)
BILIRUBIN TOTAL: 0.3 mg/dL (ref 0.0–1.2)
BUN / CREAT RATIO: 17 (ref 12–28)
BUN: 15 mg/dL (ref 8–27)
CHLORIDE: 101 mmol/L (ref 96–106)
CO2: 23 mmol/L (ref 18–29)
Calcium: 9.8 mg/dL (ref 8.7–10.3)
Creatinine, Ser: 0.88 mg/dL (ref 0.57–1.00)
GFR calc non Af Amer: 67 mL/min/{1.73_m2} (ref >59)
GFR, EST AFRICAN AMERICAN: 77 mL/min/{1.73_m2} (ref >59)
GLUCOSE: 117 mg/dL — AB (ref 65–99)
Globulin, Total: 3.4 g/dL (ref 1.5–4.5)
POTASSIUM: 4.6 mmol/L (ref 3.5–5.2)
SODIUM: 142 mmol/L (ref 134–144)
TOTAL PROTEIN: 7.6 g/dL (ref 6.0–8.5)

## 2016-01-25 ENCOUNTER — Ambulatory Visit (INDEPENDENT_AMBULATORY_CARE_PROVIDER_SITE_OTHER): Payer: Medicare Other | Admitting: Family Medicine

## 2016-01-25 VITALS — BP 129/75 | HR 73 | Temp 98.5°F | Resp 16 | Ht 63.0 in | Wt 135.0 lb

## 2016-01-25 DIAGNOSIS — E119 Type 2 diabetes mellitus without complications: Secondary | ICD-10-CM

## 2016-01-25 DIAGNOSIS — E785 Hyperlipidemia, unspecified: Secondary | ICD-10-CM | POA: Diagnosis not present

## 2016-01-25 DIAGNOSIS — M549 Dorsalgia, unspecified: Secondary | ICD-10-CM

## 2016-01-25 DIAGNOSIS — G8929 Other chronic pain: Secondary | ICD-10-CM

## 2016-01-25 MED ORDER — ATORVASTATIN CALCIUM 20 MG PO TABS: 20.0000 mg | ORAL_TABLET | Freq: Every day | ORAL | Status: DC

## 2016-01-25 MED ORDER — CYCLOBENZAPRINE HCL 10 MG PO TABS: 10.0000 mg | ORAL_TABLET | Freq: Three times a day (TID) | ORAL | Status: DC | PRN

## 2016-01-25 NOTE — Assessment & Plan Note (Signed)
Doing well on lisinopril for renal protection. No changes in BP. Will recheck A1c after 02/02/2016 and follow-up in August.

## 2016-01-25 NOTE — Patient Instructions (Signed)
Piriformis Syndrome With Rehab Piriformis syndrome is a condition the affects the nervous system in the area of the hip, and is characterized by pain and possibly a loss of feeling in the backside (posterior) thigh that may extend down the entire length of the leg. The symptoms are caused by an increase in pressure on the sciatic nerve by the piriformis muscle, which is on the back of the hip and is responsible for externally rotating the hip. The sciatic nerve and its branches connect to much of the leg. Normally the sciatic nerve runs between the piriformis muscle and other muscles. However, in certain individuals the nerve runs through the muscle, which causes an increase in pressure on the nerve and results in the symptoms of piriformis syndrome. SYMPTOMS   Pain, tingling, numbness, or burning in the back of the thigh that may also extend down the entire leg.  Occasionally, tenderness in the buttock.  Loss of function of the leg.  Pain that worsens when using the piriformis muscle (running, jumping, or stairs).  Pain that increases with prolonged sitting.  Pain that is lessened by lying flat on the back. CAUSES   Piriformis syndrome is the result of an increase in pressure placed on the sciatic nerve. Oftentimes, piriformis syndrome is an overuse injury.  Stress placed on the nerve from a sudden increase in the intensity, frequency, or duration of training.  Compensation of other extremity injuries. RISK INCREASES WITH:  Sports that involve the piriformis muscle (running, walking, or jumping).  You are born with (congenital) a defect in which the sciatic nerve passes through the muscle. PREVENTION  Warm up and stretch properly before activity.  Allow for adequate recovery between workouts.  Maintain physical fitness:  Strength, flexibility, and endurance.  Cardiovascular fitness. PROGNOSIS  If treated properly, the symptoms of piriformis syndrome usually resolve in 2 to 6  weeks. RELATED COMPLICATIONS   Persistent and possibly permanent pain and numbness in the lower extremity.  Weakness of the extremity that may progress to disability and inability to compete. TREATMENT  The most effective treatment for piriformis syndrome is rest from any activities that aggravate the symptoms. Ice and pain medication may help reduce pain and inflammation. The use of strengthening and stretching exercises may help reduce pain with activity. These exercises may be performed at home or with a therapist. A referral to a therapist may be given for further evaluation and treatment, such as ultrasound. Corticosteroid injections may be given to reduce inflammation that is causing pressure to be placed on the sciatic nerve. If nonsurgical (conservative) treatment is unsuccessful, then surgery may be recommended.  MEDICATION   If pain medication is necessary, then nonsteroidal anti-inflammatory medications, such as aspirin and ibuprofen, or other minor pain relievers, such as acetaminophen, are often recommended.  Do not take pain medication for 7 days before surgery.  Prescription pain relievers may be given if deemed necessary by your caregiver. Use only as directed and only as much as you need.  Corticosteroid injections may be given by your caregiver. These injections should be reserved for the most serious cases, because they may only be given a certain number of times. HEAT AND COLD:   Cold treatment (icing) relieves pain and reduces inflammation. Cold treatment should be applied for 10 to 15 minutes every 2 to 3 hours for inflammation and pain and immediately after any activity that aggravates your symptoms. Use ice packs or massage the area with a piece of ice (ice massage).  Heat   treatment may be used prior to performing the stretching and strengthening activities prescribed by your caregiver, physical therapist, or athletic trainer. Use a heat pack or soak the injury in warm  water. SEEK IMMEDIATE MEDICAL CARE IF:  Treatment seems to offer no benefit, or the condition worsens.  Any medications produce adverse side effects. EXERCISES RANGE OF MOTION (ROM) AND STRETCHING EXERCISES - Piriformis Syndrome These exercises may help you when beginning to rehabilitate your injury. Your symptoms may resolve with or without further involvement from your physician, physical therapist, or athletic trainer. While completing these exercises, remember:   Restoring tissue flexibility helps normal motion to return to the joints. This allows healthier, less painful movement and activity.  An effective stretch should be held for at least 30 seconds.  A stretch should never be painful. You should only feel a gentle lengthening or release in the stretched tissue. STRETCH - Hip Rotators  Lie on your back on a firm surface. Grasp your right / left knee with your right / left hand and your ankle with your opposite hand.  Keeping your hips and shoulders firmly planted, gently pull your right / left knee and rotate your lower leg toward your opposite shoulder until you feel a stretch in your buttocks.  Hold this stretch for __________ seconds. Repeat this stretch __________ times. Complete this stretch __________ times per day. STRETCH - Iliotibial Band  On the floor or bed, lie on your side so your right / left leg is on top. Bend your knee and grab your ankle.  Slowly bring your knee back so that your thigh is in line with your trunk. Keep your heel at your buttocks and gently arch your back so your head, shoulders, and hips line up.  Slowly lower your leg so that your knee approaches the floor/bed until you feel a gentle stretch on the outside of your right / left thigh. If you do not feel a stretch and your knee will not fall farther, place the heel of your opposite foot on top of your knee and pull your thigh down farther.  Hold this stretch for __________ seconds. Repeat  __________ times. Complete __________ times per day. STRENGTHENING EXERCISES - Piriformis Syndrome  These are some of the caregiver again or until your symptoms are resolved. Remember:   Strong muscles with good endurance tolerate stress better.  Do the exercises as initially prescribed by your caregiver. Progress slowly with each exercise, gradually increasing the number of repetitions and weight used under their guidance. STRENGTH - Hip Abductors, Straight Leg Raises Be aware of your form throughout the entire exercise so that you exercise the correct muscles. Sloppy form means that you are not strengthening the correct muscles.  Lie on your side so that your head, shoulders, knee, and hip line up. You may bend your lower knee to help maintain your balance. Your right / left leg should be on top.  Roll your hips slightly forward, so that your hips are stacked directly over each other and your right / left knee is facing forward.  Lift your top leg up 4-6 inches, leading with your heel. Be sure that your foot does not drift forward or that your knee does not roll toward the ceiling.  Hold this position for __________ seconds. You should feel the muscles in your outer hip lifting (you may not notice this until your leg begins to tire).  Slowly lower your leg to the starting position. Allow the muscles to fully   relax before beginning the next repetition. Repeat __________ times. Complete this exercise __________ times per day.  STRENGTH - Hip Abductors, Quadruped  On a firm, lightly padded surface, position yourself on your hands and knees. Your hands should be directly below your shoulders and your knees should be directly below your hips.  Keeping your right / left knee bent, lift your leg out to the side. Keep your legs level and in line with your shoulders.  Position yourself on your hands and knees.  Hold for __________ seconds.  Keeping your trunk steady and your hips level, slowly  lower your leg to the starting position. Repeat __________ times. Complete this exercise __________ times per day.  STRENGTH - Hip Abductors, Standing  Tie one end of a rubber exercise band/tubing to a secure surface (table, pole) and tie a loop at the other end.  Place the loop around your right / left ankle. Keeping your ankle with the band directly opposite of the secured end, step away until there is tension in the tube/band.  Hold onto a chair as needed for balance.  Keeping your back upright, your shoulders over your hips, and your toes pointing forward, lift your right / left leg out to your side. Be sure to lift your leg with your hip muscles. Do not "throw" your leg or tip your body to lift your leg.  Slowly and with control, return to the starting position. Repeat exercise __________ times. Complete this exercise __________ times per day.    This information is not intended to replace advice given to you by your health care provider. Make sure you discuss any questions you have with your health care provider.   Document Released: 08/28/2005 Document Revised: 01/12/2015 Document Reviewed: 12/10/2008 Elsevier Interactive Patient Education 2016 [employer]  

## 2016-01-25 NOTE — Assessment & Plan Note (Signed)
Doing well with flexeril PRN. Renewed. Reviewed stretching for piriformis irritation. Consider PT if not helping.

## 2016-01-25 NOTE — Progress Notes (Signed)
Subjective:    Patient ID: Diana Roberson, female    DOB: [birth year], 70 y.o.   MRN: [id number]  HPI: EDMONIA Roberson is a 70 y.o. female presenting on 01/25/2016 for Back Pain   HPI  Pt presents for follow-up for back pain and hypertension. BP is well controlled. Taking lisinopril for renal protection. Doing well. No dizziness.  Back pain is doing well with PRN flexeril. Takes about 1 per day. No drowsiness. Still having pain at piriformis muscles.  Past Medical History  Diagnosis Date  . Diabetes mellitus   . Diverticulitis   . Hyperlipidemia   . Chronic back pain   . Bipolar 1 disorder (HCC)   . Osteopenia   . Mental health disorder     admitted x3  . Asthma   . Rheumatoid arthritis (HCC)   . Allergy   . Glaucoma     Current Outpatient Prescriptions on File Prior to Visit  Medication Sig  . aspirin 81 MG tablet Take 81 mg by mouth daily.    . calcium gluconate 500 MG tablet Take 500 mg by mouth daily.    . cholecalciferol (VITAMIN D) 1000 UNITS tablet Take 1,000 Units by mouth daily.  Marland Kitchen gabapentin (NEURONTIN) 600 MG tablet Take 1 tablet by mouth 3  times daily  . lidocaine (LIDODERM) 5 % Place 1 patch onto the skin daily. Remove & Discard patch within 12 hours or as directed by MD  . lisinopril (PRINIVIL,ZESTRIL) 2.5 MG tablet Take 1 tablet (2.5 mg total) by mouth daily.  . metFORMIN (GLUCOPHAGE) 500 MG tablet Take 1 tablet by mouth  twice a day  . QUEtiapine (SEROQUEL) 50 MG tablet Take 1 tablet by mouth at  bedtime  . vitamin B-12 (CYANOCOBALAMIN) 1000 MCG tablet Take 1,000 mcg by mouth daily.    . vitamin E 400 UNIT capsule Take 400 Units by mouth daily.    . ziprasidone (GEODON) 80 MG capsule Take 1 to 2 capsules by  mouth daily   No current facility-administered medications on file prior to visit.    Review of Systems  Constitutional: Negative for fever and chills.  HENT: Negative.   Respiratory: Negative for cough, chest tightness and wheezing.     Cardiovascular: Negative for chest pain and leg swelling.  Gastrointestinal: Negative for nausea, vomiting, abdominal pain, diarrhea and constipation.  Endocrine: Negative.  Negative for cold intolerance, heat intolerance, polydipsia, polyphagia and polyuria.  Genitourinary: Negative for dysuria and difficulty urinating.  Musculoskeletal: Positive for back pain and arthralgias.  Neurological: Negative for dizziness, light-headedness and numbness.  Psychiatric/Behavioral: Negative.    Per HPI unless specifically indicated above     Objective:    BP 129/75 mmHg  Pulse 73  Temp(Src) 98.5 F (36.9 C) (Oral)  Resp 16  Ht 5\' 3"  (1.6 m)  Wt 135 lb (61.236 kg)  BMI 23.92 kg/m2  Wt Readings from Last 3 Encounters:  01/25/16 135 lb (61.236 kg)  12/20/15 137 lb 6.4 oz (62.324 kg)  11/05/15 136 lb (61.689 kg)    Physical Exam  Constitutional: She is oriented to person, place, and time. She appears well-developed and well-nourished.  HENT:  Head: Normocephalic and atraumatic.  Neck: Neck supple.  Cardiovascular: Normal rate, regular rhythm and normal heart sounds.  Exam reveals no gallop and no friction rub.   No murmur heard. Pulmonary/Chest: Effort normal and breath sounds normal. She has no wheezes. She exhibits no tenderness.  Abdominal: Soft. Normal appearance and bowel sounds are  normal. She exhibits no distension and no mass. There is no tenderness. There is no rebound and no guarding.  Musculoskeletal: Normal range of motion. She exhibits no edema or tenderness.       Right hand: She exhibits deformity (swan neck).       Left hand: She exhibits deformity (swan neck).  + tenderness at piriformis muscle.   Lymphadenopathy:    She has no cervical adenopathy.  Neurological: She is alert and oriented to person, place, and time.  Skin: Skin is warm and dry.   Results for orders placed or performed in visit on 12/20/15  Comprehensive metabolic panel  Result Value Ref Range    Glucose 117 (H) 65 - 99 mg/dL   BUN 15 8 - 27 mg/dL   Creatinine, Ser 5.85 0.57 - 1.00 mg/dL   GFR calc non Af Amer 67 >59 mL/min/1.73   GFR calc Af Amer 77 >59 mL/min/1.73   BUN/Creatinine Ratio 17 12 - 28   Sodium 142 134 - 144 mmol/L   Potassium 4.6 3.5 - 5.2 mmol/L   Chloride 101 96 - 106 mmol/L   CO2 23 18 - 29 mmol/L   Calcium 9.8 8.7 - 10.3 mg/dL   Total Protein 7.6 6.0 - 8.5 g/dL   Albumin 4.2 3.5 - 4.8 g/dL   Globulin, Total 3.4 1.5 - 4.5 g/dL   Albumin/Globulin Ratio 1.2 1.2 - 2.2   Bilirubin Total 0.3 0.0 - 1.2 mg/dL   Alkaline Phosphatase 93 39 - 117 IU/L   AST 14 0 - 40 IU/L   ALT 10 0 - 32 IU/L  Lipid Profile  Result Value Ref Range   Cholesterol, Total 205 (H) 100 - 199 mg/dL   Triglycerides 277 (H) 0 - 149 mg/dL   HDL 45 >82 mg/dL   VLDL Cholesterol Cal 31 5 - 40 mg/dL   LDL Calculated 423 (H) 0 - 99 mg/dL   Chol/HDL Ratio 4.6 (H) 0.0 - 4.4 ratio units      Assessment & Plan:   Problem List Items Addressed This Visit      Endocrine   Diabetes mellitus type 2, controlled, without complications (HCC)    Doing well on lisinopril for renal protection. No changes in BP. Will recheck A1c after 02/02/2016 and follow-up in August.       Relevant Medications   atorvastatin (LIPITOR) 20 MG tablet     Other   Chronic back pain - Primary    Doing well with flexeril PRN. Renewed. Reviewed stretching for piriformis irritation. Consider PT if not helping.       Relevant Medications   cyclobenzaprine (FLEXERIL) 10 MG tablet    Other Visit Diagnoses    Hyperlipemia        Relevant Medications    atorvastatin (LIPITOR) 20 MG tablet       Meds ordered this encounter  Medications  . DISCONTD: atorvastatin (LIPITOR) 20 MG tablet    Sig: Take 1 tablet (20 mg total) by mouth daily.    Dispense:  90 tablet    Refill:  3  . methotrexate (RHEUMATREX) 2.5 MG tablet    Sig: Take by mouth.  . folic acid (FOLVITE) 1 MG tablet    Sig: Take by mouth.  Marland Kitchen atorvastatin  (LIPITOR) 20 MG tablet    Sig: Take 1 tablet (20 mg total) by mouth daily.    Dispense:  90 tablet    Refill:  3  . cyclobenzaprine (FLEXERIL) 10 MG tablet  Sig: Take 1 tablet (10 mg total) by mouth 3 (three) times daily as needed for muscle spasms.    Dispense:  90 tablet    Refill:  3    Order Specific Question:  Supervising Provider    Answer:  Janeann Forehand [[id number]]      Follow up plan: Return in about 3 months (around 04/26/2016).

## 2016-02-02 ENCOUNTER — Ambulatory Visit
Admission: RE | Admit: 2016-02-02 | Discharge: 2016-02-02 | Disposition: A | Discharge status: Home / Self Care | Payer: Medicare Other | Source: Ambulatory Visit | Attending: Internal Medicine | Admitting: Internal Medicine

## 2016-02-02 ENCOUNTER — Other Ambulatory Visit: Payer: Self-pay | Admitting: Internal Medicine

## 2016-02-02 DIAGNOSIS — Z1231 Encounter for screening mammogram for malignant neoplasm of breast: Secondary | ICD-10-CM

## 2016-02-02 DIAGNOSIS — M059 Rheumatoid arthritis with rheumatoid factor, unspecified: Secondary | ICD-10-CM | POA: Diagnosis not present

## 2016-02-02 DIAGNOSIS — M85852 Other specified disorders of bone density and structure, left thigh: Secondary | ICD-10-CM | POA: Diagnosis not present

## 2016-02-02 DIAGNOSIS — E2839 Other primary ovarian failure: Secondary | ICD-10-CM | POA: Insufficient documentation

## 2016-02-02 DIAGNOSIS — Z79899 Other long term (current) drug therapy: Secondary | ICD-10-CM | POA: Diagnosis not present

## 2016-02-02 DIAGNOSIS — Z780 Asymptomatic menopausal state: Secondary | ICD-10-CM | POA: Diagnosis not present

## 2016-02-02 LAB — HM DEXA SCAN

## 2016-02-11 DIAGNOSIS — H34831 Tributary (branch) retinal vein occlusion, right eye, with macular edema: Secondary | ICD-10-CM | POA: Diagnosis not present

## 2016-03-06 ENCOUNTER — Telehealth: Payer: Self-pay | Admitting: Internal Medicine

## 2016-03-06 NOTE — Telephone Encounter (Signed)
Okay to set her up Probably needs to be Medicare wellness if she is due

## 2016-03-06 NOTE — Telephone Encounter (Signed)
Patient transferred to [facility].  Patient said she couldn't get an appointment with Dr.Letvak soon enough, so she switched.  Patient isn't happy and would like to come back and see Dr.Letvak.  Patient said it was a terrible mistake.  Patient would like to schedule a physical with Dr.Letvak. Please advise.

## 2016-03-07 NOTE — Telephone Encounter (Signed)
Pt returned your call.  

## 2016-03-07 NOTE — Telephone Encounter (Signed)
I spoke to patient and she scheduled appointment for physical on 05/01/16.

## 2016-03-10 ENCOUNTER — Other Ambulatory Visit: Payer: Self-pay | Admitting: Internal Medicine

## 2016-03-10 NOTE — Telephone Encounter (Signed)
Are you okay with #90 to mail order?

## 2016-03-10 NOTE — Telephone Encounter (Signed)
Approved: #1 month supply Changed doctors but is coming back

## 2016-03-10 NOTE — Telephone Encounter (Signed)
Last filled 01-05-16 #90 Last OV 11-05-15 No Future visit (cancelled 2 that were scheduled for August)

## 2016-03-11 NOTE — Telephone Encounter (Signed)
yes

## 2016-03-13 NOTE — Telephone Encounter (Signed)
Rx sent electronically.  

## 2016-03-23 DIAGNOSIS — S0502XA Injury of conjunctiva and corneal abrasion without foreign body, left eye, initial encounter: Secondary | ICD-10-CM | POA: Diagnosis not present

## 2016-03-24 DIAGNOSIS — S0502XD Injury of conjunctiva and corneal abrasion without foreign body, left eye, subsequent encounter: Secondary | ICD-10-CM | POA: Diagnosis not present

## 2016-04-24 ENCOUNTER — Other Ambulatory Visit: Payer: Self-pay | Admitting: Internal Medicine

## 2016-04-25 DIAGNOSIS — H401132 Primary open-angle glaucoma, bilateral, moderate stage: Secondary | ICD-10-CM | POA: Diagnosis not present

## 2016-05-01 ENCOUNTER — Ambulatory Visit (INDEPENDENT_AMBULATORY_CARE_PROVIDER_SITE_OTHER): Payer: Medicare Other | Admitting: Internal Medicine

## 2016-05-01 ENCOUNTER — Encounter: Payer: Self-pay | Admitting: Internal Medicine

## 2016-05-01 VITALS — BP 110/70 | HR 89 | Temp 98.2°F | Ht 63.5 in | Wt 138.0 lb

## 2016-05-01 DIAGNOSIS — M059 Rheumatoid arthritis with rheumatoid factor, unspecified: Secondary | ICD-10-CM

## 2016-05-01 DIAGNOSIS — G8929 Other chronic pain: Secondary | ICD-10-CM

## 2016-05-01 DIAGNOSIS — E119 Type 2 diabetes mellitus without complications: Secondary | ICD-10-CM

## 2016-05-01 DIAGNOSIS — Z7189 Other specified counseling: Secondary | ICD-10-CM

## 2016-05-01 DIAGNOSIS — Z0000 Encounter for general adult medical examination without abnormal findings: Secondary | ICD-10-CM | POA: Diagnosis not present

## 2016-05-01 DIAGNOSIS — M549 Dorsalgia, unspecified: Secondary | ICD-10-CM

## 2016-05-01 DIAGNOSIS — F3174 Bipolar disorder, in full remission, most recent episode manic: Secondary | ICD-10-CM | POA: Diagnosis not present

## 2016-05-01 MED ORDER — FOLIC ACID 1 MG PO TABS: 1.0000 mg | ORAL_TABLET | Freq: Every day | ORAL | 3 refills | Status: DC

## 2016-05-01 MED ORDER — METHOTREXATE 2.5 MG PO TABS: 15.0000 mg | ORAL_TABLET | ORAL | 3 refills | Status: DC

## 2016-05-01 NOTE — Progress Notes (Signed)
Pre visit review using our clinic review tool, if applicable. No additional management support is needed unless otherwise documented below in the visit note. 

## 2016-05-01 NOTE — Patient Instructions (Signed)
It would be okay to wean off the gabapentin. Please restart exercise--walking and if you can, doing some light weight training in a gym.

## 2016-05-01 NOTE — Assessment & Plan Note (Signed)
See social history 

## 2016-05-01 NOTE — Progress Notes (Signed)
 Subjective:    Patient ID: Diana Roberson, female    DOB: 07/26/1946, 70 y.o.   MRN: [id number]  HPI Here for Medicare wellness and follow up of chronic health conditions Reviewed form and advanced directives Reviewed other doctors No tobacco or alcohol No exercise---discussed it Vision and hearing are fine Independent with instrumental ADLs except for some hand issues--can't cut up food or using broom. Does have housecleaner every 2-3 months (deep cleaning) No apparent cognitive problems No falls No depression or anhedonia  Had seen doctor in [place] Was overwhelmed with referrals, etc "I just wanted something for my back" She felt the flexeril helped her--only used it once in a while  Sent to rheumatologist--put on MTX but only took for 3 weeks Did have elevated ESR and CRP She was concerned about using this --and all the blood work, etc Not much pain in hands though--despite the deformities Discussed expensive biologic--doesn't want that  Now on BP med BP lower now Still on lisinopril  Mood continues to be doing fine On both antipsychotics still--this has done well to control mood and keep her level  Checks sugars occasionally Usually pretty good--under 130 No hypoglycemic No sores, pain or numbness in feet  Current Outpatient Prescriptions on File Prior to Visit  Medication Sig Dispense Refill  . aspirin 81 MG tablet Take 81 mg by mouth daily.      . atorvastatin (LIPITOR) 20 MG tablet Take 1 tablet (20 mg total) by mouth daily. 90 tablet 3  . calcium gluconate 500 MG tablet Take 1,000 mg by mouth daily.     . cholecalciferol (VITAMIN D) 1000 UNITS tablet Take 1,000 Units by mouth daily.    . cyclobenzaprine (FLEXERIL) 10 MG tablet Take 1 tablet (10 mg total) by mouth 3 (three) times daily as needed for muscle spasms. 90 tablet 3  . gabapentin (NEURONTIN) 600 MG tablet Take 1 tablet by mouth 3  times daily 270 tablet 3  . lidocaine (LIDODERM) 5 % Place 1 patch  onto the skin daily. Remove & Discard patch within 12 hours or as directed by MD 90 patch 3  . lisinopril (PRINIVIL,ZESTRIL) 2.5 MG tablet Take 1 tablet (2.5 mg total) by mouth daily. 30 tablet 11  . metFORMIN (GLUCOPHAGE) 500 MG tablet Take 1 tablet by mouth  twice a day 180 tablet 3  . QUEtiapine (SEROQUEL) 50 MG tablet Take 1 tablet by mouth at  bedtime 90 tablet 0  . vitamin B-12 (CYANOCOBALAMIN) 1000 MCG tablet Take 1,000 mcg by mouth daily.      . vitamin E 400 UNIT capsule Take 400 Units by mouth daily.      . ziprasidone (GEODON) 80 MG capsule Take 1 to 2 capsules by  mouth daily 180 capsule 3  . folic acid (FOLVITE) 1 MG tablet Take by mouth.     No current facility-administered medications on file prior to visit.     Allergies  Allergen Reactions  . Codeine Sulfate     Indigestion and pain  . Sertraline Hcl     REACTION: Worse, ? irritable    Past Medical History:  Diagnosis Date  . Allergy   . Asthma   . Bipolar 1 disorder (HCC)   . Chronic back pain   . Diabetes mellitus   . Diverticulitis   . Glaucoma   . Hyperlipidemia   . Mental health disorder    admitted x3  . Osteopenia   . Rheumatoid arthritis (HCC)       Past Surgical History:  Procedure Laterality Date  . BACK SURGERY  1988  . BREAST CYST ASPIRATION     neg  . CHOLECYSTECTOMY      Family History  Problem Relation Age of Onset  . Diabetes Mother   . Hypertension Mother   . Stroke Sister   . Hashimoto's thyroiditis Daughter   . Stroke Father   . Stroke Sister   . Cancer Neg Hx   . Breast cancer Neg Hx     Social History   Social History  . Marital status: Married    Spouse name: N/A  . Number of children: 2  . Years of education: N/A   Occupational History  . formerly Clinical cytogeneticist for [employer]  . Smoking status: Never Smoker  . Smokeless tobacco: Never Used  . Alcohol use No  . Drug use: No  . Sexual activity: Not on file   Other  Topics Concern  . Not on file   Social History Narrative   Has living will   Husband is health care POA.   Would accept resuscitation attempts   Would not want feeding tube if cognitively unaware   Review of Systems Appetite is good Weight is up a little Sleeps well Wears seat belt Teeth are fine. Keeps up with dentist Bowels are fine. Voids fine. No incontinence No rash or suspicious skin lesions    Objective:   Physical Exam  Constitutional: She is oriented to person, place, and time. She appears well-developed and well-nourished. No distress.  HENT:  Mouth/Throat: Oropharynx is clear and moist. No oropharyngeal exudate.  Neck: Normal range of motion. Neck supple. No thyromegaly present.  Cardiovascular: Normal rate, regular rhythm, normal heart sounds and intact distal pulses.  Exam reveals no gallop.   No murmur heard. Pulmonary/Chest: Effort normal and breath sounds normal. No respiratory distress. She has no wheezes. She has no rales.  Abdominal: Soft. There is no tenderness.  Musculoskeletal: She exhibits no edema.  Mod severe ulnar deviation in right hand--mild on left. Toe deformities No sig tenderness or active synovitis  Lymphadenopathy:    She has no cervical adenopathy.  Neurological: She is alert and oriented to person, place, and time.  President-- "Dwaine Deter, Bush" [phone] [id number] Recall 3/3  Skin: No rash noted. No erythema.  No foot lesions  Psychiatric: She has a normal mood and affect. Her behavior is normal.          Assessment & Plan:

## 2016-05-01 NOTE — Assessment & Plan Note (Signed)
Elevated inflammatory markers but no pain or sig tenderness Will try the MTX again to see if she has any response

## 2016-05-01 NOTE — Assessment & Plan Note (Signed)
Seems to still be well controlled Will check labs 

## 2016-05-01 NOTE — Assessment & Plan Note (Signed)
I have personally reviewed the Medicare Annual Wellness questionnaire and have noted 1. The patient's medical and social history 2. Their use of alcohol, tobacco or illicit drugs 3. Their current medications and supplements 4. The patient's functional ability including ADL's, fall risks, home safety risks and hearing or visual             impairment. 5. Diet and physical activities 6. Evidence for depression or mood disorders  The patients weight, height, BMI and visual acuity have been recorded in the chart I have made referrals, counseling and provided education to the patient based review of the above and I have provided the pt with a written personalized care plan for preventive services.  I have provided you with a copy of your personalized plan for preventive services. Please take the time to review along with your updated medication list.  Recommended flu vaccine Colon cancer screening is due next year mammo due 2019 Discussed restarting some exercise

## 2016-05-01 NOTE — Assessment & Plan Note (Signed)
Doing well on her current regimen Will continue

## 2016-05-02 DIAGNOSIS — H401132 Primary open-angle glaucoma, bilateral, moderate stage: Secondary | ICD-10-CM | POA: Diagnosis not present

## 2016-05-02 LAB — HM DIABETES EYE EXAM

## 2016-05-02 LAB — HEMOGLOBIN A1C: HEMOGLOBIN A1C: 6 % (ref 4.6–6.5)

## 2016-05-03 ENCOUNTER — Telehealth: Payer: Self-pay | Admitting: Internal Medicine

## 2016-05-03 ENCOUNTER — Telehealth: Payer: Self-pay

## 2016-05-03 NOTE — Telephone Encounter (Signed)
Most sinus infections are viral and should not be treated with antibiotics. Occasionally you can get a secondary bacterial infection that may need treatment--but this is rare before 7-10 days. She should try symptom relief with pain relievers and some people get better with nasal sprays like flonase (now OTC) If she is getting worse next week, we can reconsider

## 2016-05-03 NOTE — Telephone Encounter (Signed)
Pt states that she called this morning at 7:30 regarding a sinus infection.  She wants to know if something has been called in to her pharmacy or if she should go see someone about her symptoms.  Can you please reach out to her at [phone]..  Thanks.

## 2016-05-03 NOTE — Telephone Encounter (Signed)
Patient called back about message.  I let patient know Dr.Letvak's comments.

## 2016-05-03 NOTE — Telephone Encounter (Signed)
Please read my comments in the other phone note

## 2016-05-03 NOTE — Telephone Encounter (Signed)
Pt left v/m that pt was seen 05/01/16 for annual exam; pts face and teeth started hurting on 05/02/16; pt thinks she has sinus infection. Pt wants abx sent to [facility]; pt does not want to schedule appt.

## 2016-05-04 ENCOUNTER — Ambulatory Visit: Payer: Medicare Other | Admitting: Family Medicine

## 2016-05-05 ENCOUNTER — Ambulatory Visit: Payer: Medicare Other | Admitting: Internal Medicine

## 2016-05-17 ENCOUNTER — Encounter: Payer: Self-pay | Admitting: Internal Medicine

## 2016-06-02 ENCOUNTER — Other Ambulatory Visit: Payer: Self-pay | Admitting: Internal Medicine

## 2016-06-16 DIAGNOSIS — H34831 Tributary (branch) retinal vein occlusion, right eye, with macular edema: Secondary | ICD-10-CM | POA: Diagnosis not present

## 2016-07-28 ENCOUNTER — Other Ambulatory Visit: Payer: Self-pay | Admitting: Internal Medicine

## 2016-08-11 ENCOUNTER — Ambulatory Visit: Payer: Medicare Other | Admitting: Internal Medicine

## 2016-08-11 DIAGNOSIS — H34831 Tributary (branch) retinal vein occlusion, right eye, with macular edema: Secondary | ICD-10-CM | POA: Diagnosis not present

## 2016-08-16 ENCOUNTER — Ambulatory Visit (INDEPENDENT_AMBULATORY_CARE_PROVIDER_SITE_OTHER): Payer: Medicare Other | Admitting: Internal Medicine

## 2016-08-16 ENCOUNTER — Encounter: Payer: Self-pay | Admitting: Internal Medicine

## 2016-08-16 VITALS — BP 126/80 | HR 101 | Temp 98.4°F | Wt 138.0 lb

## 2016-08-16 DIAGNOSIS — M059 Rheumatoid arthritis with rheumatoid factor, unspecified: Secondary | ICD-10-CM

## 2016-08-16 NOTE — Progress Notes (Signed)
Pre visit review using our clinic review tool, if applicable. No additional management support is needed unless otherwise documented below in the visit note. 

## 2016-08-16 NOTE — Progress Notes (Signed)
Subjective:    Patient ID: Diana Roberson, female    DOB: 1945/10/01, 70 y.o.   MRN: [id number]  HPI Here for follow up of rheumatoid arthritis  Tried 6 per week and no problems or side effects Didn't see any change in her hands with 3 months of Rx Stopped it very recently  No pain Only needs help opening bottles, etc  No recent change  Current Outpatient Prescriptions on File Prior to Visit  Medication Sig Dispense Refill  . aspirin 81 MG tablet Take 81 mg by mouth every other day.      Marland Kitchen atorvastatin (LIPITOR) 20 MG tablet Take 1 tablet (20 mg total) by mouth daily. 90 tablet 3  . calcium gluconate 500 MG tablet Take 1,000 mg by mouth daily.     . cholecalciferol (VITAMIN D) 1000 UNITS tablet Take 1,000 Units by mouth daily.    . cyclobenzaprine (FLEXERIL) 10 MG tablet Take 1 tablet (10 mg total) by mouth 3 (three) times daily as needed for muscle spasms. 90 tablet 3  . lisinopril (PRINIVIL,ZESTRIL) 2.5 MG tablet Take 1 tablet (2.5 mg total) by mouth daily. 30 tablet 11  . metFORMIN (GLUCOPHAGE) 500 MG tablet TAKE 1 TABLET BY MOUTH  TWICE A DAY 180 tablet 0  . QUEtiapine (SEROQUEL) 50 MG tablet Take 1 tablet by mouth at  bedtime 90 tablet 0  . vitamin B-12 (CYANOCOBALAMIN) 1000 MCG tablet Take 1,000 mcg by mouth daily.      . vitamin E 400 UNIT capsule Take 400 Units by mouth daily.      . ziprasidone (GEODON) 80 MG capsule Take 1 to 2 capsules by  mouth daily 180 capsule 3   No current facility-administered medications on file prior to visit.     Allergies  Allergen Reactions  . Codeine Sulfate     Indigestion and pain  . Sertraline Hcl     REACTION: Worse, ? irritable    Past Medical History:  Diagnosis Date  . Allergy   . Asthma   . Bipolar 1 disorder (HCC)   . Chronic back pain   . Diabetes mellitus   . Diverticulitis   . Glaucoma   . Hyperlipidemia   . Mental health disorder    admitted x3  . Osteopenia   . Rheumatoid arthritis [facility])     Past  Surgical History:  Procedure Laterality Date  . BACK SURGERY  1988  . BREAST CYST ASPIRATION     neg  . CHOLECYSTECTOMY      Family History  Problem Relation Age of Onset  . Diabetes Mother   . Hypertension Mother   . Stroke Sister   . Hashimoto's thyroiditis Daughter   . Stroke Father   . Stroke Sister   . Cancer Neg Hx   . Breast cancer Neg Hx     Social History   Social History  . Marital status: Married    Spouse name: N/A  . Number of children: 2  . Years of education: N/A   Occupational History  . formerly Designer, jewellery for [employer]    Social History Main Topics  . Smoking status: Never Smoker  . Smokeless tobacco: Never Used  . Alcohol use No  . Drug use: No  . Sexual activity: Not on file   Other Topics Concern  . Not on file   Social History Narrative   Has living will   Husband is health care POA.   Would accept resuscitation attempts  Would not want feeding tube if cognitively unaware   Review of Systems     Objective:   Physical Exam  Musculoskeletal:  Sig ulnar deviation in right hand, mildly in left No obvious swelling or synovitis though          Assessment & Plan:

## 2016-08-16 NOTE — Assessment & Plan Note (Signed)
No response from the MTX after 3 months No pain or obvious PE findings of active synovitis Will hold off on further Rx

## 2016-09-22 ENCOUNTER — Other Ambulatory Visit: Payer: Self-pay | Admitting: Internal Medicine

## 2016-09-22 NOTE — Telephone Encounter (Signed)
Rx sent electronically.  

## 2016-09-22 NOTE — Telephone Encounter (Signed)
Seroquel last filled 06-02-16 #90 Last OV 08-16-16 Next OV 02-19-17

## 2016-09-22 NOTE — Telephone Encounter (Signed)
Approved:can fill both for a year

## 2016-10-02 ENCOUNTER — Other Ambulatory Visit: Payer: Self-pay

## 2016-10-02 MED ORDER — CYCLOBENZAPRINE HCL 10 MG PO TABS: 10.0000 mg | ORAL_TABLET | Freq: Three times a day (TID) | ORAL | 1 refills | Status: DC | PRN

## 2016-10-02 NOTE — Telephone Encounter (Signed)
Last sent to mail order 01-25-16 #180/3 Last OV 08-16-16 Next OV 02-19-17

## 2016-10-02 NOTE — Telephone Encounter (Signed)
Approved: #180 x 1 

## 2016-11-03 DIAGNOSIS — H401132 Primary open-angle glaucoma, bilateral, moderate stage: Secondary | ICD-10-CM | POA: Diagnosis not present

## 2016-11-10 ENCOUNTER — Other Ambulatory Visit: Payer: Self-pay | Admitting: Internal Medicine

## 2016-11-13 DIAGNOSIS — H34831 Tributary (branch) retinal vein occlusion, right eye, with macular edema: Secondary | ICD-10-CM | POA: Diagnosis not present

## 2016-11-13 LAB — HM DIABETES EYE EXAM

## 2016-11-20 ENCOUNTER — Encounter: Payer: Self-pay | Admitting: Internal Medicine

## 2016-11-24 DIAGNOSIS — M05741 Rheumatoid arthritis with rheumatoid factor of right hand without organ or systems involvement: Secondary | ICD-10-CM | POA: Diagnosis not present

## 2016-11-24 DIAGNOSIS — M05742 Rheumatoid arthritis with rheumatoid factor of left hand without organ or systems involvement: Secondary | ICD-10-CM | POA: Diagnosis not present

## 2016-12-19 DIAGNOSIS — M059 Rheumatoid arthritis with rheumatoid factor, unspecified: Secondary | ICD-10-CM | POA: Diagnosis not present

## 2016-12-19 DIAGNOSIS — Z79899 Other long term (current) drug therapy: Secondary | ICD-10-CM | POA: Diagnosis not present

## 2016-12-22 DIAGNOSIS — H34831 Tributary (branch) retinal vein occlusion, right eye, with macular edema: Secondary | ICD-10-CM | POA: Diagnosis not present

## 2017-01-30 DIAGNOSIS — M19031 Primary osteoarthritis, right wrist: Secondary | ICD-10-CM | POA: Diagnosis not present

## 2017-01-30 DIAGNOSIS — M19041 Primary osteoarthritis, right hand: Secondary | ICD-10-CM | POA: Diagnosis not present

## 2017-01-30 DIAGNOSIS — M05031 Felty's syndrome, right wrist: Secondary | ICD-10-CM | POA: Diagnosis not present

## 2017-01-30 DIAGNOSIS — G8918 Other acute postprocedural pain: Secondary | ICD-10-CM | POA: Diagnosis not present

## 2017-01-30 DIAGNOSIS — M05831 Other rheumatoid arthritis with rheumatoid factor of right wrist: Secondary | ICD-10-CM | POA: Diagnosis not present

## 2017-01-30 DIAGNOSIS — M05841 Other rheumatoid arthritis with rheumatoid factor of right hand: Secondary | ICD-10-CM | POA: Diagnosis not present

## 2017-01-30 DIAGNOSIS — G5641 Causalgia of right upper limb: Secondary | ICD-10-CM | POA: Diagnosis not present

## 2017-02-02 ENCOUNTER — Other Ambulatory Visit: Payer: Self-pay | Admitting: Internal Medicine

## 2017-02-08 ENCOUNTER — Other Ambulatory Visit: Payer: Self-pay

## 2017-02-08 MED ORDER — ATORVASTATIN CALCIUM 20 MG PO TABS: 20.0000 mg | ORAL_TABLET | Freq: Every day | ORAL | 0 refills | Status: DC

## 2017-02-08 NOTE — Telephone Encounter (Signed)
Rx sent electronically.  

## 2017-02-13 DIAGNOSIS — M059 Rheumatoid arthritis with rheumatoid factor, unspecified: Secondary | ICD-10-CM | POA: Diagnosis not present

## 2017-02-13 DIAGNOSIS — Z79899 Other long term (current) drug therapy: Secondary | ICD-10-CM | POA: Diagnosis not present

## 2017-02-14 DIAGNOSIS — G5641 Causalgia of right upper limb: Secondary | ICD-10-CM | POA: Diagnosis not present

## 2017-02-14 DIAGNOSIS — Z4789 Encounter for other orthopedic aftercare: Secondary | ICD-10-CM | POA: Diagnosis not present

## 2017-02-19 ENCOUNTER — Encounter: Payer: Self-pay | Admitting: Internal Medicine

## 2017-02-19 ENCOUNTER — Ambulatory Visit (INDEPENDENT_AMBULATORY_CARE_PROVIDER_SITE_OTHER): Payer: Medicare Other | Admitting: Internal Medicine

## 2017-02-19 VITALS — BP 118/70 | HR 89 | Temp 98.0°F | Wt 133.0 lb

## 2017-02-19 DIAGNOSIS — M059 Rheumatoid arthritis with rheumatoid factor, unspecified: Secondary | ICD-10-CM

## 2017-02-19 DIAGNOSIS — F3174 Bipolar disorder, in full remission, most recent episode manic: Secondary | ICD-10-CM | POA: Diagnosis not present

## 2017-02-19 DIAGNOSIS — E785 Hyperlipidemia, unspecified: Secondary | ICD-10-CM

## 2017-02-19 DIAGNOSIS — E119 Type 2 diabetes mellitus without complications: Secondary | ICD-10-CM | POA: Diagnosis not present

## 2017-02-19 LAB — HM DIABETES FOOT EXAM

## 2017-02-19 LAB — LIPID PANEL
CHOLESTEROL: 113 mg/dL (ref 0–200)
HDL: 39.1 mg/dL (ref >39.00)
LDL Cholesterol: 44 mg/dL (ref 0–99)
NonHDL: 73.96
Total CHOL/HDL Ratio: 3
Triglycerides: 149 mg/dL (ref 0.0–149.0)
VLDL: 29.8 mg/dL (ref 0.0–40.0)

## 2017-02-19 LAB — HEMOGLOBIN A1C: Hgb A1c MFr Bld: 7.1 % — ABNORMAL HIGH (ref 4.6–6.5)

## 2017-02-19 MED ORDER — HYDROCORTISONE 2.5 % EX CREA: TOPICAL_CREAM | Freq: Three times a day (TID) | CUTANEOUS | 3 refills | Status: DC | PRN

## 2017-02-19 NOTE — Assessment & Plan Note (Signed)
Seems to still have good control Will check levels

## 2017-02-19 NOTE — Progress Notes (Signed)
Subjective:    Patient ID: Diana Roberson, female    DOB: Apr 09, 1946, 71 y.o.   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 aspirin 81 MG tablet Take 81 mg by mouth every other day.      Marland Kitchen atorvastatin (LIPITOR) 20 MG tablet Take 1 tablet (20 mg total) by mouth daily. 90 tablet 0  . calcium gluconate 500 MG tablet Take 1,000 mg by mouth daily.     . cholecalciferol (VITAMIN D) 1000 UNITS tablet Take 1,000 Units by mouth daily.    . cyclobenzaprine (FLEXERIL) 10 MG tablet Take 1 tablet (10 mg total) by mouth 3 (three) times daily as needed for muscle spasms. 180 tablet 1  . lisinopril (PRINIVIL,ZESTRIL) 2.5 MG tablet Take 1 tablet (2.5 mg total) by mouth daily. 30 tablet 11  . metFORMIN (GLUCOPHAGE) 500 MG tablet TAKE 1 TABLET BY MOUTH  TWICE A DAY 180 tablet 0  . QUEtiapine (SEROQUEL) 50 MG tablet TAKE 1 TABLET BY MOUTH AT  BEDTIME 90 tablet 3  . vitamin B-12 (CYANOCOBALAMIN) 1000 MCG tablet Take 1,000 mcg by mouth daily.      . vitamin E 400 UNIT capsule Take 400 Units by mouth daily.      . ziprasidone (GEODON) 80 MG capsule TAKE 1 TO 2 CAPSULES BY  MOUTH DAILY 180 capsule 3   No current facility-administered medications on file prior to visit.     Allergies  Allergen Reactions  . Codeine Sulfate     Indigestion and pain  . Sertraline Hcl     REACTION: Worse, ? irritable    Past Medical History:  Diagnosis Date  . Allergy   . Asthma   .  Bipolar 1 disorder (HCC)   . Chronic back pain   . Diabetes mellitus   . Diverticulitis   . Glaucoma   . Hyperlipidemia   . Mental health disorder    admitted x3  . Osteopenia   . Rheumatoid arthritis [facility])     Past Surgical History:  Procedure Laterality Date  . BACK SURGERY  1988  . BREAST CYST ASPIRATION     neg  . CHOLECYSTECTOMY      Family History  Problem Relation Age of Onset  . Diabetes Mother   . Hypertension Mother   . Stroke Sister   . Hashimoto's thyroiditis Daughter   . Stroke Father   . Stroke Sister   . Cancer Neg Hx   . Breast cancer Neg Hx     Social History   Social History  . Marital status: Married    Spouse name: N/A  . Number of children: 2  . Years of education: N/A   Occupational History  . formerly Designer, jewellery for [employer]    Social History Main Topics  . Smoking status: Never Smoker  . Smokeless tobacco: Never Used  . Alcohol use No  . Drug use: No  . Sexual activity: Not on file   Other  Topics Concern  . Not on file   Social History Narrative   Has living will   Husband is health care POA.   Would accept resuscitation attempts   Would not want feeding tube if cognitively unaware   Review of Systems  Sleeps okay Appetite is good Weight is stable     Objective:   Physical Exam  Constitutional: She appears well-developed. No distress.  Neck: No thyromegaly present.  Cardiovascular: Normal rate, regular rhythm, normal heart sounds and intact distal pulses.  Exam reveals no gallop.   No murmur heard. Pulmonary/Chest: Effort normal and breath sounds normal. No respiratory distress. She has no wheezes. She has no rales.  Musculoskeletal:  Cast on right hand/wrist Deformities in toes  Lymphadenopathy:    She has no cervical adenopathy.  Neurological:  Normal sensation in feet  Skin:  No foot lesions Slight tiny superficial (non infected) ulcers at base between left 2nd and 3rd finger  Psychiatric:  She has a normal mood and affect. Her behavior is normal.          Assessment & Plan:

## 2017-02-19 NOTE — Addendum Note (Signed)
Addended by: Tillman Abide I on: 02/19/2017 04:05 PM   Modules accepted: Orders

## 2017-02-19 NOTE — Assessment & Plan Note (Signed)
No problems with statin 

## 2017-02-19 NOTE — Assessment & Plan Note (Signed)
Doing well Continue her current regimen (2 mood stabilizers)

## 2017-02-19 NOTE — Assessment & Plan Note (Signed)
Now back on MTX per Dr Lizbeth Bark She is looking into Rx with biologics

## 2017-02-28 DIAGNOSIS — M05741 Rheumatoid arthritis with rheumatoid factor of right hand without organ or systems involvement: Secondary | ICD-10-CM | POA: Diagnosis not present

## 2017-03-02 DIAGNOSIS — M05741 Rheumatoid arthritis with rheumatoid factor of right hand without organ or systems involvement: Secondary | ICD-10-CM | POA: Diagnosis not present

## 2017-03-05 ENCOUNTER — Telehealth: Payer: Self-pay | Admitting: Internal Medicine

## 2017-03-05 NOTE — Telephone Encounter (Signed)
Pt called - states she has a Clinical cytogeneticist message and is unsure what it is about.  cb number is [phone] Thanks

## 2017-03-05 NOTE — Telephone Encounter (Signed)
She was not able to get her lab results from Mychart. Gave her the results.

## 2017-03-09 DIAGNOSIS — H34831 Tributary (branch) retinal vein occlusion, right eye, with macular edema: Secondary | ICD-10-CM | POA: Diagnosis not present

## 2017-03-09 DIAGNOSIS — M05741 Rheumatoid arthritis with rheumatoid factor of right hand without organ or systems involvement: Secondary | ICD-10-CM | POA: Diagnosis not present

## 2017-03-16 ENCOUNTER — Other Ambulatory Visit: Payer: Self-pay

## 2017-03-16 DIAGNOSIS — E119 Type 2 diabetes mellitus without complications: Secondary | ICD-10-CM

## 2017-03-16 DIAGNOSIS — M05741 Rheumatoid arthritis with rheumatoid factor of right hand without organ or systems involvement: Secondary | ICD-10-CM | POA: Diagnosis not present

## 2017-03-16 MED ORDER — LISINOPRIL 2.5 MG PO TABS: 2.5000 mg | ORAL_TABLET | Freq: Every day | ORAL | 3 refills | Status: DC

## 2017-03-16 NOTE — Telephone Encounter (Signed)
Rx sent electronically.  

## 2017-03-20 DIAGNOSIS — E119 Type 2 diabetes mellitus without complications: Secondary | ICD-10-CM | POA: Diagnosis not present

## 2017-03-20 DIAGNOSIS — Z79899 Other long term (current) drug therapy: Secondary | ICD-10-CM | POA: Diagnosis not present

## 2017-03-20 DIAGNOSIS — M059 Rheumatoid arthritis with rheumatoid factor, unspecified: Secondary | ICD-10-CM | POA: Diagnosis not present

## 2017-03-22 DIAGNOSIS — M05741 Rheumatoid arthritis with rheumatoid factor of right hand without organ or systems involvement: Secondary | ICD-10-CM | POA: Diagnosis not present

## 2017-03-26 DIAGNOSIS — M05741 Rheumatoid arthritis with rheumatoid factor of right hand without organ or systems involvement: Secondary | ICD-10-CM | POA: Diagnosis not present

## 2017-04-06 DIAGNOSIS — M05741 Rheumatoid arthritis with rheumatoid factor of right hand without organ or systems involvement: Secondary | ICD-10-CM | POA: Diagnosis not present

## 2017-04-11 DIAGNOSIS — M05741 Rheumatoid arthritis with rheumatoid factor of right hand without organ or systems involvement: Secondary | ICD-10-CM | POA: Diagnosis not present

## 2017-04-19 DIAGNOSIS — M05741 Rheumatoid arthritis with rheumatoid factor of right hand without organ or systems involvement: Secondary | ICD-10-CM | POA: Diagnosis not present

## 2017-04-24 DIAGNOSIS — M05741 Rheumatoid arthritis with rheumatoid factor of right hand without organ or systems involvement: Secondary | ICD-10-CM | POA: Diagnosis not present

## 2017-05-01 DIAGNOSIS — H401132 Primary open-angle glaucoma, bilateral, moderate stage: Secondary | ICD-10-CM | POA: Diagnosis not present

## 2017-05-03 DIAGNOSIS — H401132 Primary open-angle glaucoma, bilateral, moderate stage: Secondary | ICD-10-CM | POA: Diagnosis not present

## 2017-05-03 LAB — HM DIABETES EYE EXAM

## 2017-05-11 DIAGNOSIS — M05741 Rheumatoid arthritis with rheumatoid factor of right hand without organ or systems involvement: Secondary | ICD-10-CM | POA: Diagnosis not present

## 2017-05-23 ENCOUNTER — Other Ambulatory Visit: Payer: Self-pay | Admitting: Internal Medicine

## 2017-05-30 ENCOUNTER — Other Ambulatory Visit: Payer: Self-pay | Admitting: Internal Medicine

## 2017-05-31 NOTE — Telephone Encounter (Signed)
Approved: okay x 1 year 

## 2017-06-07 ENCOUNTER — Encounter: Payer: Self-pay | Admitting: Internal Medicine

## 2017-06-19 DIAGNOSIS — Z79899 Other long term (current) drug therapy: Secondary | ICD-10-CM | POA: Diagnosis not present

## 2017-06-19 DIAGNOSIS — M059 Rheumatoid arthritis with rheumatoid factor, unspecified: Secondary | ICD-10-CM | POA: Diagnosis not present

## 2017-06-19 DIAGNOSIS — E119 Type 2 diabetes mellitus without complications: Secondary | ICD-10-CM | POA: Diagnosis not present

## 2017-06-20 DIAGNOSIS — M05742 Rheumatoid arthritis with rheumatoid factor of left hand without organ or systems involvement: Secondary | ICD-10-CM | POA: Diagnosis not present

## 2017-06-20 DIAGNOSIS — M05741 Rheumatoid arthritis with rheumatoid factor of right hand without organ or systems involvement: Secondary | ICD-10-CM | POA: Diagnosis not present

## 2017-07-25 ENCOUNTER — Telehealth: Payer: Self-pay | Admitting: Internal Medicine

## 2017-07-25 DIAGNOSIS — Z1211 Encounter for screening for malignant neoplasm of colon: Secondary | ICD-10-CM

## 2017-07-25 NOTE — Telephone Encounter (Signed)
Referral placed.

## 2017-08-14 DIAGNOSIS — H34831 Tributary (branch) retinal vein occlusion, right eye, with macular edema: Secondary | ICD-10-CM | POA: Diagnosis not present

## 2017-08-23 ENCOUNTER — Encounter: Payer: Medicare Other | Admitting: Internal Medicine

## 2017-09-13 ENCOUNTER — Other Ambulatory Visit: Payer: Self-pay | Admitting: Internal Medicine

## 2017-09-14 NOTE — Telephone Encounter (Signed)
Will refill times one in pcp absence

## 2017-09-14 NOTE — Telephone Encounter (Signed)
Electronic refill Last refill 10/02/16 #180/1 Last office visit 02/19/17

## 2017-10-05 DIAGNOSIS — K219 Gastro-esophageal reflux disease without esophagitis: Secondary | ICD-10-CM | POA: Diagnosis not present

## 2017-10-05 DIAGNOSIS — Z1211 Encounter for screening for malignant neoplasm of colon: Secondary | ICD-10-CM | POA: Diagnosis not present

## 2017-10-22 DIAGNOSIS — Z79899 Other long term (current) drug therapy: Secondary | ICD-10-CM | POA: Diagnosis not present

## 2017-10-22 DIAGNOSIS — M059 Rheumatoid arthritis with rheumatoid factor, unspecified: Secondary | ICD-10-CM | POA: Diagnosis not present

## 2017-11-01 ENCOUNTER — Ambulatory Visit (INDEPENDENT_AMBULATORY_CARE_PROVIDER_SITE_OTHER): Payer: Medicare Other | Admitting: Internal Medicine

## 2017-11-01 ENCOUNTER — Encounter: Payer: Self-pay | Admitting: Internal Medicine

## 2017-11-01 VITALS — BP 128/82 | HR 96 | Temp 97.8°F | Ht 63.5 in | Wt 134.0 lb

## 2017-11-01 DIAGNOSIS — M059 Rheumatoid arthritis with rheumatoid factor, unspecified: Secondary | ICD-10-CM | POA: Diagnosis not present

## 2017-11-01 DIAGNOSIS — Z7189 Other specified counseling: Secondary | ICD-10-CM | POA: Diagnosis not present

## 2017-11-01 DIAGNOSIS — Z23 Encounter for immunization: Secondary | ICD-10-CM | POA: Diagnosis not present

## 2017-11-01 DIAGNOSIS — K219 Gastro-esophageal reflux disease without esophagitis: Secondary | ICD-10-CM | POA: Insufficient documentation

## 2017-11-01 DIAGNOSIS — F3174 Bipolar disorder, in full remission, most recent episode manic: Secondary | ICD-10-CM

## 2017-11-01 DIAGNOSIS — Z0000 Encounter for general adult medical examination without abnormal findings: Secondary | ICD-10-CM | POA: Diagnosis not present

## 2017-11-01 DIAGNOSIS — E119 Type 2 diabetes mellitus without complications: Secondary | ICD-10-CM

## 2017-11-01 LAB — HM DIABETES FOOT EXAM

## 2017-11-01 MED ORDER — KETOCONAZOLE 2 % EX CREA: 1.0000 "application " | TOPICAL_CREAM | Freq: Two times a day (BID) | CUTANEOUS | 1 refills | Status: DC

## 2017-11-01 MED ORDER — FAMOTIDINE 20 MG PO TABS: 20.0000 mg | ORAL_TABLET | Freq: Two times a day (BID) | ORAL | 11 refills | Status: DC

## 2017-11-01 NOTE — Assessment & Plan Note (Signed)
I have personally reviewed the Medicare Annual Wellness questionnaire and have noted 1. The patient's medical and social history 2. Their use of alcohol, tobacco or illicit drugs 3. Their current medications and supplements 4. The patient's functional ability including ADL's, fall risks, home safety risks and hearing or visual             impairment. 5. Diet and physical activities 6. Evidence for depression or mood disorders  The patients weight, height, BMI and visual acuity have been recorded in the chart I have made referrals, counseling and provided education to the patient based review of the above and I have provided the pt with a written personalized care plan for preventive services.  I have provided you with a copy of your personalized plan for preventive services. Please take the time to review along with your updated medication list.  Pneumovax booster Due for mammogram Colon in the next couple of months Discussed exercise

## 2017-11-01 NOTE — Assessment & Plan Note (Signed)
See social history 

## 2017-11-01 NOTE — Assessment & Plan Note (Signed)
On MTX per Dr Renard Matter

## 2017-11-01 NOTE — Assessment & Plan Note (Signed)
Quiet on the 2 mood stabilizers

## 2017-11-01 NOTE — Assessment & Plan Note (Signed)
Seems to still have good control Check labs

## 2017-11-01 NOTE — Progress Notes (Signed)
Hearing Screening   125Hz  250Hz  500Hz  1000Hz  2000Hz  3000Hz  4000Hz  6000Hz  8000Hz   Right ear:   20 25 20  20     Left ear:   20 40 20  20    Vision Screening Comments: December 2018

## 2017-11-01 NOTE — Progress Notes (Signed)
Subjective:    Patient ID: Diana Roberson, female    DOB: [birth year], 72 y.o.   MRN: [id number]  HPI Here for Medicare wellness and follow up of chronic medical conditions Reviewed form and advanced directives Reviewed other doctors No alcohol or tobacco Doesn't exercise---discussed Vision and hearing are fine No falls No mood problems---chronic disease Independent with most instrumental ADLs. Husband helps No sig memory problems  Checks sugars every couple of weeks Usually 140's or lower No low sugar reactions No numbness or pain in feet UTD with eye exam  No mood issues No mania or depressed mood Sleeps well with the 2 meds  Doing well with MTX Seeing Dr Renard Matter for this Considering orencia  Current Outpatient Medications on File Prior to Visit  Medication Sig Dispense Refill  . Ascorbic Acid (VITAMIN C PO) Take 2 tablets by mouth daily.    Marland Kitchen aspirin 81 MG tablet Take 81 mg by mouth every other day.      Marland Kitchen atorvastatin (LIPITOR) 20 MG tablet TAKE 1 TABLET BY MOUTH  DAILY 90 tablet 1  . calcium gluconate 500 MG tablet Take 1,000 mg by mouth daily.     . cholecalciferol (VITAMIN D) 1000 UNITS tablet Take 1,000 Units by mouth daily.    . cyclobenzaprine (FLEXERIL) 10 MG tablet TAKE 1 TABLET BY MOUTH 3  TIMES DAILY AS NEEDED FOR  MUSCLE SPASM(S) 180 tablet 0  . folic acid (FOLVITE) 1 MG tablet Take 1 mg by mouth daily.    . hydrocortisone 2.5 % cream Apply topically 3 (three) times daily as needed. 28 g 3  . lisinopril (PRINIVIL,ZESTRIL) 2.5 MG tablet Take 1 tablet (2.5 mg total) by mouth daily. 90 tablet 3  . metFORMIN (GLUCOPHAGE) 500 MG tablet TAKE 1 TABLET BY MOUTH  TWICE A DAY 180 tablet 1  . methotrexate (RHEUMATREX) 2.5 MG tablet TAKE 6 TABLETS BY MOUTH  ONCE A WEEK.  CAUTION:CHEMOTHERAPY.  PROTECT FROM LIGHT. (Patient taking differently: TAKE 7 TABLETS BY MOUTH  ONCE A WEEK.  CAUTION:CHEMOTHERAPY.  PROTECT FROM LIGHT.) 78 tablet 3  . QUEtiapine (SEROQUEL) 50 MG  tablet TAKE 1 TABLET BY MOUTH AT  BEDTIME 90 tablet 3  . vitamin B-12 (CYANOCOBALAMIN) 1000 MCG tablet Take 1,000 mcg by mouth daily.      . vitamin E 400 UNIT capsule Take 400 Units by mouth daily.      . ziprasidone (GEODON) 80 MG capsule TAKE 1 TO 2 CAPSULES BY  MOUTH DAILY 180 capsule 3   No current facility-administered medications on file prior to visit.     Allergies  Allergen Reactions  . Codeine Sulfate     Indigestion and pain  . Sertraline Hcl     REACTION: Worse, ? irritable    Past Medical History:  Diagnosis Date  . Allergy   . Asthma   . Bipolar 1 disorder (HCC)   . Chronic back pain   . Diabetes mellitus   . Diverticulitis   . Glaucoma   . Hyperlipidemia   . Mental health disorder    admitted x3  . Osteopenia   . Rheumatoid arthritis [facility])     Past Surgical History:  Procedure Laterality Date  . BACK SURGERY  1988  . BREAST CYST ASPIRATION     neg  . CHOLECYSTECTOMY      Family History  Problem Relation Age of Onset  . Diabetes Mother   . Hypertension Mother   . Stroke Sister   . Hashimoto's  thyroiditis Daughter   . Stroke Father   . Stroke Sister   . Cancer Neg Hx   . Breast cancer Neg Hx     Social History   Socioeconomic History  . Marital status: Married    Spouse name: Not on file  . Number of children: 2  . Years of education: Not on file  . Highest education level: Not on file  Social Needs  . Financial resource strain: Not on file  . Food insecurity - worry: Not on file  . Food insecurity - inability: Not on file  . Transportation needs - medical: Not on file  . Transportation needs - non-medical: Not on file  Occupational History  . Occupation: formerly Designer, jewellery for [employer]  . Smoking status: Never Smoker  . Smokeless tobacco: Never Used  Substance and Sexual Activity  . Alcohol use: No  . Drug use: No  . Sexual activity: Not on file  Other Topics Concern  . Not on file  Social  History Narrative   Has living will   Husband is health care POA. Alternate is daughter [name]   Would accept resuscitation attempts   Would not want feeding tube if cognitively unaware   Review of Systems Appetite is good Weight is slightly down---trying Wears seat belt Teeth are fine---keeps up with dentist Bowels are fine. No blood. Has colonoscopy set up soon Still with intertriginous redness between 2nd and 3rd fingers bilaterally. Will try antifungal No other skin problems Voids fine Some heartburn---relates to coffee. Rolaids helps but using 5x/week. No dysphagia    Objective:   Physical Exam  Constitutional: She is oriented to person, place, and time. She appears well-developed. No distress.  HENT:  Mouth/Throat: Oropharynx is clear and moist. No oropharyngeal exudate.  Neck: No thyromegaly present.  Cardiovascular: Normal rate, regular rhythm, normal heart sounds and intact distal pulses. Exam reveals no gallop.  No murmur heard. Pulmonary/Chest: Effort normal and breath sounds normal. No respiratory distress. She has no wheezes. She has no rales.  Abdominal: Soft. There is no tenderness.  Musculoskeletal: She exhibits no edema or tenderness.  Thickening in metatarsals and hammertoes  Lymphadenopathy:    She has no cervical adenopathy.  Neurological: She is alert and oriented to person, place, and time.  President--- "[place], [place]" [phone] [id number] Recall 3/3  Normal sensation in feet  Skin:  No foot lesions  Psychiatric: She has a normal mood and affect. Her behavior is normal.          Assessment & Plan:

## 2017-11-01 NOTE — Addendum Note (Signed)
Addended by: Eual Fines on: 11/01/2017 04:39 PM   Modules accepted: Orders

## 2017-11-01 NOTE — Patient Instructions (Signed)
Please set up your screening mammogram. 

## 2017-11-01 NOTE — Assessment & Plan Note (Signed)
Persistent symptoms Try famotidine---change to omeprazole if persists

## 2017-11-02 LAB — LIPID PANEL
Cholesterol: 122 mg/dL (ref 0–200)
HDL: 42.6 mg/dL (ref >39.00)
LDL Cholesterol: 48 mg/dL (ref 0–99)
NONHDL: 79.81
Total CHOL/HDL Ratio: 3
Triglycerides: 160 mg/dL — ABNORMAL HIGH (ref 0.0–149.0)
VLDL: 32 mg/dL (ref 0.0–40.0)

## 2017-11-02 LAB — RENAL FUNCTION PANEL
Albumin: 4.2 g/dL (ref 3.5–5.2)
BUN: 23 mg/dL (ref 6–23)
CALCIUM: 9.9 mg/dL (ref 8.4–10.5)
CO2: 27 mEq/L (ref 19–32)
CREATININE: 0.84 mg/dL (ref 0.40–1.20)
Chloride: 103 mEq/L (ref 96–112)
GFR: 70.7 mL/min (ref >60.00)
GLUCOSE: 161 mg/dL — AB (ref 70–99)
Phosphorus: 4.2 mg/dL (ref 2.3–4.6)
Potassium: 4.2 mEq/L (ref 3.5–5.1)
Sodium: 140 mEq/L (ref 135–145)

## 2017-11-02 LAB — HEMOGLOBIN A1C: HEMOGLOBIN A1C: 7 % — AB (ref 4.6–6.5)

## 2017-11-05 ENCOUNTER — Telehealth: Payer: Self-pay | Admitting: Internal Medicine

## 2017-11-05 NOTE — Telephone Encounter (Signed)
Spoke to pt. Gave her info to call MyChart Help Desk to fix her MyChart account

## 2017-11-05 NOTE — Telephone Encounter (Signed)
Copied from CRM [id number]. Topic: Quick Communication - Lab Results >> Nov 05, 2017  9:10 AM Diana Roberson, RMA wrote: Patient is requesting lab results, please return call

## 2017-11-19 DIAGNOSIS — H34831 Tributary (branch) retinal vein occlusion, right eye, with macular edema: Secondary | ICD-10-CM | POA: Diagnosis not present

## 2017-11-20 ENCOUNTER — Other Ambulatory Visit: Payer: Self-pay | Admitting: Internal Medicine

## 2017-11-27 ENCOUNTER — Encounter: Payer: Self-pay | Admitting: Anesthesiology

## 2017-11-27 ENCOUNTER — Encounter
Admission: RE | Disposition: A | Discharge status: Home / Self Care | Payer: Self-pay | Source: Ambulatory Visit | Attending: Gastroenterology

## 2017-11-27 ENCOUNTER — Ambulatory Visit: Payer: Medicare Other | Admitting: Anesthesiology

## 2017-11-27 ENCOUNTER — Ambulatory Visit
Admission: RE | Admit: 2017-11-27 | Discharge: 2017-11-27 | Disposition: A | Discharge status: Home / Self Care | Payer: Medicare Other | Source: Ambulatory Visit | Attending: Gastroenterology | Admitting: Gastroenterology

## 2017-11-27 DIAGNOSIS — E785 Hyperlipidemia, unspecified: Secondary | ICD-10-CM | POA: Insufficient documentation

## 2017-11-27 DIAGNOSIS — K5730 Diverticulosis of large intestine without perforation or abscess without bleeding: Secondary | ICD-10-CM | POA: Diagnosis not present

## 2017-11-27 DIAGNOSIS — F319 Bipolar disorder, unspecified: Secondary | ICD-10-CM | POA: Diagnosis not present

## 2017-11-27 DIAGNOSIS — Z7982 Long term (current) use of aspirin: Secondary | ICD-10-CM | POA: Insufficient documentation

## 2017-11-27 DIAGNOSIS — K5790 Diverticulosis of intestine, part unspecified, without perforation or abscess without bleeding: Secondary | ICD-10-CM | POA: Diagnosis not present

## 2017-11-27 DIAGNOSIS — Z79899 Other long term (current) drug therapy: Secondary | ICD-10-CM | POA: Diagnosis not present

## 2017-11-27 DIAGNOSIS — H409 Unspecified glaucoma: Secondary | ICD-10-CM | POA: Diagnosis not present

## 2017-11-27 DIAGNOSIS — Z1211 Encounter for screening for malignant neoplasm of colon: Secondary | ICD-10-CM | POA: Diagnosis not present

## 2017-11-27 DIAGNOSIS — E119 Type 2 diabetes mellitus without complications: Secondary | ICD-10-CM | POA: Diagnosis not present

## 2017-11-27 DIAGNOSIS — M8580 Other specified disorders of bone density and structure, unspecified site: Secondary | ICD-10-CM | POA: Insufficient documentation

## 2017-11-27 DIAGNOSIS — Z7984 Long term (current) use of oral hypoglycemic drugs: Secondary | ICD-10-CM | POA: Insufficient documentation

## 2017-11-27 DIAGNOSIS — J45909 Unspecified asthma, uncomplicated: Secondary | ICD-10-CM | POA: Diagnosis not present

## 2017-11-27 DIAGNOSIS — M069 Rheumatoid arthritis, unspecified: Secondary | ICD-10-CM | POA: Diagnosis not present

## 2017-11-27 DIAGNOSIS — K219 Gastro-esophageal reflux disease without esophagitis: Secondary | ICD-10-CM | POA: Diagnosis not present

## 2017-11-27 HISTORY — PX: COLONOSCOPY WITH PROPOFOL: SHX5780

## 2017-11-27 LAB — GLUCOSE, CAPILLARY: Glucose-Capillary: 119 mg/dL — ABNORMAL HIGH (ref 65–99)

## 2017-11-27 SURGERY — COLONOSCOPY WITH PROPOFOL
Anesthesia: General

## 2017-11-27 MED ORDER — PROPOFOL 10 MG/ML IV BOLUS
INTRAVENOUS | Status: DC | PRN
  Administered 2017-11-27: 100 mg via INTRAVENOUS

## 2017-11-27 MED ORDER — SODIUM CHLORIDE 0.9 % IV SOLN
INTRAVENOUS | Status: DC
  Administered 2017-11-27: 1000 mL via INTRAVENOUS
  Administered 2017-11-27: 08:00:00 via INTRAVENOUS

## 2017-11-27 MED ORDER — FENTANYL CITRATE (PF) 100 MCG/2ML IJ SOLN
INTRAMUSCULAR | Status: AC
  Filled 2017-11-27: qty 2

## 2017-11-27 MED ORDER — PROPOFOL 500 MG/50ML IV EMUL
INTRAVENOUS | Status: DC | PRN
  Administered 2017-11-27: 140 ug/kg/min via INTRAVENOUS

## 2017-11-27 MED ORDER — PROPOFOL 10 MG/ML IV BOLUS
INTRAVENOUS | Status: AC
  Filled 2017-11-27: qty 20

## 2017-11-27 MED ORDER — SODIUM CHLORIDE 0.9 % IV SOLN: INTRAVENOUS | Status: DC

## 2017-11-27 MED ORDER — PROPOFOL 500 MG/50ML IV EMUL
INTRAVENOUS | Status: AC
  Filled 2017-11-27: qty 50

## 2017-11-27 MED ORDER — LIDOCAINE 2% (20 MG/ML) 5 ML SYRINGE
INTRAMUSCULAR | Status: DC | PRN
  Administered 2017-11-27: 30 mg via INTRAVENOUS

## 2017-11-27 MED ORDER — LIDOCAINE HCL (PF) 1 % IJ SOLN
2.0000 mL | Freq: Once | INTRAMUSCULAR | Status: AC
  Administered 2017-11-27: 0.3 mL via INTRADERMAL

## 2017-11-27 MED ORDER — PHENYLEPHRINE HCL 10 MG/ML IJ SOLN
INTRAMUSCULAR | Status: DC | PRN
  Administered 2017-11-27 (×2): 100 ug via INTRAVENOUS

## 2017-11-27 MED ORDER — LIDOCAINE HCL (PF) 2 % IJ SOLN
INTRAMUSCULAR | Status: AC
  Filled 2017-11-27: qty 10

## 2017-11-27 MED ORDER — LIDOCAINE HCL (PF) 1 % IJ SOLN
INTRAMUSCULAR | Status: AC
  Administered 2017-11-27: 0.3 mL via INTRADERMAL
  Filled 2017-11-27: qty 2

## 2017-11-27 MED ORDER — FENTANYL CITRATE (PF) 100 MCG/2ML IJ SOLN
INTRAMUSCULAR | Status: DC | PRN
  Administered 2017-11-27: 50 ug via INTRAVENOUS

## 2017-11-27 NOTE — Transfer of Care (Signed)
Immediate Anesthesia Transfer of Care Note  Patient: Diana Roberson  Procedure(s) Performed: COLONOSCOPY WITH PROPOFOL (N/A )  Patient Location: PACU and Endoscopy Unit  Anesthesia Type:General  Level of Consciousness: sedated  Airway & Oxygen Therapy: Patient Spontanous Breathing and Patient connected to nasal cannula oxygen  Post-op Assessment: Report given to RN and Post -op Vital signs reviewed and stable  Post vital signs: Reviewed and stable  Last Vitals:  Vitals:   11/27/17 0737  BP: (!) 148/74  Pulse: (!) 103  Resp: 17  Temp: (!) 36.2 C  SpO2: 100%    Last Pain:  Vitals:   11/27/17 0737  TempSrc: Tympanic         Complications: No apparent anesthesia complications

## 2017-11-27 NOTE — H&P (Signed)
Outpatient short stay form Pre-procedure 11/27/2017 8:16 AM Christena Deem MD  Primary Physician: Dr. Tillman Abide  Reason for visit: Colonoscopy  History of present illness: Patient is a 72 year old female presenting today as above.  Her last colonoscopy was about 10 years ago which was normal with the exception of some diverticulosis.  He is presenting today for screening.  Tolerated prep well.  He does take 81 mg aspirin daily.  That is been held.  She takes no other aspirin products or blood thinning agent.    Current Facility-Administered Medications:  .  0.9 %  sodium chloride infusion, , Intravenous, Continuous, Christena Deem, MD, Last Rate: 20 mL/hr at 11/27/17 0753 .  0.9 %  sodium chloride infusion, , Intravenous, Continuous, Christena Deem, MD  Medications Prior to Admission  Medication Sig Dispense Refill Last Dose  . Ascorbic Acid (VITAMIN C PO) Take 2 tablets by mouth daily.   Taking  . aspirin 81 MG tablet Take 81 mg by mouth every other day.     11/25/2017  . atorvastatin (LIPITOR) 20 MG tablet TAKE 1 TABLET BY MOUTH  DAILY 90 tablet 3   . calcium gluconate 500 MG tablet Take 1,000 mg by mouth daily.    Taking  . cholecalciferol (VITAMIN D) 1000 UNITS tablet Take 1,000 Units by mouth daily.   Taking  . cyclobenzaprine (FLEXERIL) 10 MG tablet TAKE 1 TABLET BY MOUTH 3  TIMES DAILY AS NEEDED FOR  MUSCLE SPASM(S) 180 tablet 0 Taking  . famotidine (PEPCID) 20 MG tablet Take 1 tablet (20 mg total) by mouth 2 (two) times daily. 60 tablet 11   . folic acid (FOLVITE) 1 MG tablet Take 1 mg by mouth daily.   Taking  . hydrocortisone 2.5 % cream Apply topically 3 (three) times daily as needed. 28 g 3 Taking  . ketoconazole (NIZORAL) 2 % cream Apply 1 application topically 2 (two) times daily. 60 g 1   . lisinopril (PRINIVIL,ZESTRIL) 2.5 MG tablet Take 1 tablet (2.5 mg total) by mouth daily. 90 tablet 3 Taking  . metFORMIN (GLUCOPHAGE) 500 MG tablet TAKE 1 TABLET BY MOUTH   TWICE A DAY 180 tablet 3   . methotrexate (RHEUMATREX) 2.5 MG tablet TAKE 6 TABLETS BY MOUTH  ONCE A WEEK.  CAUTION:CHEMOTHERAPY.  PROTECT FROM LIGHT. (Patient taking differently: TAKE 7 TABLETS BY MOUTH  ONCE A WEEK.  CAUTION:CHEMOTHERAPY.  PROTECT FROM LIGHT.) 78 tablet 3 Taking  . QUEtiapine (SEROQUEL) 50 MG tablet TAKE 1 TABLET BY MOUTH AT  BEDTIME 90 tablet 3 Taking  . vitamin B-12 (CYANOCOBALAMIN) 1000 MCG tablet Take 1,000 mcg by mouth daily.     Taking  . vitamin E 400 UNIT capsule Take 400 Units by mouth daily.     Taking  . ziprasidone (GEODON) 80 MG capsule TAKE 1 TO 2 CAPSULES BY  MOUTH DAILY 180 capsule 3 Taking     Allergies  Allergen Reactions  . Codeine Sulfate     Indigestion and pain  . Sertraline Hcl     REACTION: Worse, ? irritable     Past Medical History:  Diagnosis Date  . Allergy   . Asthma   . Bipolar 1 disorder (HCC)   . Chronic back pain   . Diabetes mellitus   . Diverticulitis   . Glaucoma   . Hyperlipidemia   . Mental health disorder    admitted x3  . Osteopenia   . Rheumatoid arthritis (HCC)     Review  of systems:      Physical Exam    Heart and lungs: Regular rate and rhythm without rub or gallop, lungs are bilaterally clear    HEENT: Normocephalic atraumatic eyes are anicteric    Other:    Pertinant exam for procedure: Soft nontender nondistended bowel sounds positive normoactive    Planned proceedures: Colonoscopy and indicated procedures. I have discussed the risks benefits and complications of procedures to include not limited to bleeding, infection, perforation and the risk of sedation and the patient wishes to proceed.    Christena Deem, MD Gastroenterology 11/27/2017  8:16 AM

## 2017-11-27 NOTE — Anesthesia Postprocedure Evaluation (Signed)
Anesthesia Post Note  Patient: Diana Roberson  Procedure(s) Performed: COLONOSCOPY WITH PROPOFOL (N/A )  Patient location during evaluation: Endoscopy Anesthesia Type: General Level of consciousness: awake and alert Pain management: pain level controlled Vital Signs Assessment: post-procedure vital signs reviewed and stable Respiratory status: spontaneous breathing, nonlabored ventilation, respiratory function stable and patient connected to nasal cannula oxygen Cardiovascular status: blood pressure returned to baseline and stable Postop Assessment: no apparent nausea or vomiting Anesthetic complications: no     Last Vitals:  Vitals:   11/27/17 0920 11/27/17 0930  BP: (!) 141/73 (!) 144/77  Pulse: 86 82  Resp: 18 18  Temp:    SpO2: 100%     Last Pain:  Vitals:   11/27/17 0737  TempSrc: Tympanic                 [place]on [place]

## 2017-11-27 NOTE — Op Note (Signed)
[facility] Patient Name: Diana Roberson Procedure Date: 11/27/2017 8:11 AM MRN: [id number] Account #: [id number] Date of Birth: Jan 11, 1946 Admit Type: Outpatient Age: 72 Room: [facility] ENDO ROOM 3 Gender: Female Note Status: Finalized Procedure:            Colonoscopy Indications:          Screening for colorectal malignant neoplasm Providers:            Christena Deem, MD Referring MD:         Karie Schwalbe (Referring MD) Medicines:            Monitored Anesthesia Care Complications:        No immediate complications. Procedure:            Pre-Anesthesia Assessment:                       - ASA Grade Assessment: III - A patient with severe                        systemic disease.                       After obtaining informed consent, the colonoscope was                        passed under direct vision. Throughout the procedure,                        the patient's blood pressure, pulse, and oxygen                        saturations were monitored continuously. The                        Colonoscope was introduced through the anus and                        advanced to the the cecum, identified by appendiceal                        orifice and ileocecal valve. The colonoscopy was                        performed without difficulty. The patient tolerated the                        procedure well. The quality of the bowel preparation                        was good. Findings:      Multiple medium-mouthed diverticula were found in the sigmoid colon,       descending colon, transverse colon and ascending colon.      The exam was otherwise normal throughout the examined colon.      The digital rectal exam was normal. Impression:           - Diverticulosis in the sigmoid colon, in the                        descending colon, in the transverse colon and in the  ascending colon.                       - No specimens  collected. Recommendation:       - Discharge patient to home. Procedure Code(s):    --- Professional ---                       [phone], Colonoscopy, flexible; diagnostic, including                        collection of specimen(s) by brushing or washing, when                        performed (separate procedure) Diagnosis Code(s):    --- Professional ---                       Z12.11, Encounter for screening for malignant neoplasm                        of colon                       K57.30, Diverticulosis of large intestine without                        perforation or abscess without bleeding CPT copyright 2016 American Medical Association. All rights reserved. The codes documented in this report are preliminary and upon coder review may  be revised to meet current compliance requirements. Christena Deem, MD 11/27/2017 8:48:14 AM This report has been signed electronically. Number of Addenda: 0 Note Initiated On: 11/27/2017 8:11 AM Scope Withdrawal Time: 0 hours 7 minutes 6 seconds  Total Procedure Duration: 0 hours 19 minutes 48 seconds       [facility]

## 2017-11-27 NOTE — Anesthesia Preprocedure Evaluation (Signed)
Anesthesia Evaluation  Patient identified by MRN, date of birth, ID band Patient awake    Reviewed: Allergy & Precautions, NPO status , Patient's Chart, lab work & pertinent test results, reviewed documented beta blocker date and time   Airway Mallampati: II  TM Distance: >3 FB     Dental  (+) Chipped   Pulmonary asthma ,           Cardiovascular      Neuro/Psych PSYCHIATRIC DISORDERS Bipolar Disorder    GI/Hepatic GERD  ,  Endo/Other  diabetes, Type 2  Renal/GU      Musculoskeletal  (+) Arthritis ,   Abdominal   Peds  Hematology   Anesthesia Other Findings   Reproductive/Obstetrics                             Anesthesia Physical Anesthesia Plan  ASA: III  Anesthesia Plan: General   Post-op Pain Management:    Induction: Intravenous  PONV Risk Score and Plan:   Airway Management Planned:   Additional Equipment:   Intra-op Plan:   Post-operative Plan:   Informed Consent: I have reviewed the patients History and Physical, chart, labs and discussed the procedure including the risks, benefits and alternatives for the proposed anesthesia with the patient or authorized representative who has indicated his/her understanding and acceptance.     Plan Discussed with: CRNA  Anesthesia Plan Comments:         Anesthesia Quick Evaluation

## 2017-11-27 NOTE — Anesthesia Post-op Follow-up Note (Signed)
Anesthesia QCDR form completed.        

## 2017-11-28 ENCOUNTER — Encounter: Payer: Self-pay | Admitting: Gastroenterology

## 2017-11-29 DIAGNOSIS — H401132 Primary open-angle glaucoma, bilateral, moderate stage: Secondary | ICD-10-CM | POA: Diagnosis not present

## 2017-12-18 ENCOUNTER — Other Ambulatory Visit: Payer: Self-pay | Admitting: Internal Medicine

## 2017-12-18 NOTE — Telephone Encounter (Signed)
Last Rx 09/2010 x29yr. Last OV 10/2017-CPE

## 2017-12-18 NOTE — Telephone Encounter (Signed)
Approved: okay for 1 year 

## 2017-12-27 ENCOUNTER — Telehealth: Payer: Self-pay | Admitting: Internal Medicine

## 2017-12-27 MED ORDER — CYCLOBENZAPRINE HCL 10 MG PO TABS: ORAL_TABLET | ORAL | 0 refills | Status: DC

## 2017-12-27 NOTE — Telephone Encounter (Signed)
Filled in PCP absence

## 2017-12-27 NOTE — Telephone Encounter (Signed)
Copied from CRM [id number]. Topic: General - Other >> Dec 27, 2017 10:19 AM Elliot Gault wrote:  Caller name: Vince  Relation to pt: from [facility] Call back number: [phone]  fax # [phone]  Pharmacy: [facility] - [place], [place] - [address] [place] [phone] (Phone) [phone] (Fax)  Reason for call:  Mail order requesting 90 day supply cyclobenzaprine (FLEXERIL) 10 MG tablet, requesting Rx please send electronically / e-scribe

## 2017-12-27 NOTE — Telephone Encounter (Signed)
Last Rx 09/2017 #90, Last OV 10/2017

## 2018-01-03 ENCOUNTER — Other Ambulatory Visit: Payer: Self-pay | Admitting: Internal Medicine

## 2018-01-03 NOTE — Telephone Encounter (Signed)
Copied from CRM [id number]. Topic: Quick Communication - Rx Refill/Question >> Jan 03, 2018  1:42 PM Arlyss Gandy, NT wrote: Medication: folic acid (FOLVITE) 1 MG tablet  Has the patient contacted their pharmacy? Yes.   (Agent: If no, request that the patient contact the pharmacy for the refill.) Preferred Pharmacy (with phone number or street name): [facility] - [place], [place] - [address] [phone] (Phone) [phone] (Fax)     Agent: Please be advised that RX refills may take up to 3 business days. We ask that you follow-up with your pharmacy.

## 2018-01-04 NOTE — Telephone Encounter (Signed)
Folic Acid refill Last OV: 8/93/81 Last Refill:11/01/17 by historical provider Pharmacy: [facility] - [place], [place] - [address] [facility] [place] [phone] (Phone) [phone] (Fax)

## 2018-01-07 MED ORDER — FOLIC ACID 1 MG PO TABS: 1.0000 mg | ORAL_TABLET | Freq: Every day | ORAL | 3 refills | Status: AC

## 2018-01-09 ENCOUNTER — Other Ambulatory Visit: Payer: Self-pay | Admitting: Internal Medicine

## 2018-01-09 MED ORDER — FAMOTIDINE 20 MG PO TABS: 20.0000 mg | ORAL_TABLET | Freq: Two times a day (BID) | ORAL | 2 refills | Status: DC

## 2018-01-09 NOTE — Telephone Encounter (Signed)
Refill per protocol to [facility]. Spoke with Sam at [place] and cancelled remaining pepcid refills. Pt notified and voiced understanding.

## 2018-01-09 NOTE — Telephone Encounter (Signed)
Copied from CRM [id number]. Topic: Quick Communication - Rx Refill/Question >> Jan 09, 2018  1:55 PM Cipriano Bunker wrote:  Medication: famotidine (PEPCID) 20 MG tablet 90 day supply please  Has the patient contacted their pharmacy? No. (Agent: If no, request that the patient contact the pharmacy for the refill.) Preferred Pharmacy (with phone number or street name):   [facility] - [place], [place] - [address] [place] [address] [place] [address] Phone: [phone] Fax: [phone]   Agent: Please be advised that RX refills may take up to 3 business days. We ask that you follow-up with your pharmacy.

## 2018-01-09 NOTE — Telephone Encounter (Signed)
Pepcid (requesting 90 day supply) Last OV:11/01/17 Last filled:11/01/17 60 tab/11 refills [id number] Pharmacy: [facility] - [place], [place] - [address] [phone] (Phone) [phone] (Fax)

## 2018-01-17 ENCOUNTER — Other Ambulatory Visit: Payer: Self-pay | Admitting: Internal Medicine

## 2018-01-17 DIAGNOSIS — Z79899 Other long term (current) drug therapy: Secondary | ICD-10-CM | POA: Diagnosis not present

## 2018-01-17 DIAGNOSIS — Z1231 Encounter for screening mammogram for malignant neoplasm of breast: Secondary | ICD-10-CM

## 2018-01-17 DIAGNOSIS — M059 Rheumatoid arthritis with rheumatoid factor, unspecified: Secondary | ICD-10-CM | POA: Diagnosis not present

## 2018-01-29 DIAGNOSIS — M059 Rheumatoid arthritis with rheumatoid factor, unspecified: Secondary | ICD-10-CM | POA: Diagnosis not present

## 2018-02-05 ENCOUNTER — Other Ambulatory Visit: Payer: Self-pay | Admitting: Internal Medicine

## 2018-02-05 DIAGNOSIS — E119 Type 2 diabetes mellitus without complications: Secondary | ICD-10-CM

## 2018-02-11 DIAGNOSIS — M0579 Rheumatoid arthritis with rheumatoid factor of multiple sites without organ or systems involvement: Secondary | ICD-10-CM | POA: Diagnosis not present

## 2018-02-12 ENCOUNTER — Ambulatory Visit
Admission: RE | Admit: 2018-02-12 | Discharge: 2018-02-12 | Disposition: A | Discharge status: Home / Self Care | Payer: Medicare Other | Source: Ambulatory Visit | Attending: Internal Medicine | Admitting: Internal Medicine

## 2018-02-12 DIAGNOSIS — Z1231 Encounter for screening mammogram for malignant neoplasm of breast: Secondary | ICD-10-CM | POA: Diagnosis not present

## 2018-02-22 ENCOUNTER — Other Ambulatory Visit: Payer: Self-pay | Admitting: Internal Medicine

## 2018-03-11 DIAGNOSIS — M059 Rheumatoid arthritis with rheumatoid factor, unspecified: Secondary | ICD-10-CM | POA: Diagnosis not present

## 2018-03-11 DIAGNOSIS — Z79899 Other long term (current) drug therapy: Secondary | ICD-10-CM | POA: Diagnosis not present

## 2018-03-18 DIAGNOSIS — H34831 Tributary (branch) retinal vein occlusion, right eye, with macular edema: Secondary | ICD-10-CM | POA: Diagnosis not present

## 2018-03-18 LAB — HM DIABETES EYE EXAM

## 2018-03-20 ENCOUNTER — Encounter: Payer: Self-pay | Admitting: Internal Medicine

## 2018-04-17 ENCOUNTER — Other Ambulatory Visit: Payer: Self-pay | Admitting: Family Medicine

## 2018-04-18 NOTE — Telephone Encounter (Signed)
Approved: okay #[id number] x 0 

## 2018-05-30 DIAGNOSIS — H401132 Primary open-angle glaucoma, bilateral, moderate stage: Secondary | ICD-10-CM | POA: Diagnosis not present

## 2018-06-03 DIAGNOSIS — Z79899 Other long term (current) drug therapy: Secondary | ICD-10-CM | POA: Diagnosis not present

## 2018-06-03 DIAGNOSIS — M059 Rheumatoid arthritis with rheumatoid factor, unspecified: Secondary | ICD-10-CM | POA: Diagnosis not present

## 2018-06-06 DIAGNOSIS — H401132 Primary open-angle glaucoma, bilateral, moderate stage: Secondary | ICD-10-CM | POA: Diagnosis not present

## 2018-06-17 DIAGNOSIS — H34831 Tributary (branch) retinal vein occlusion, right eye, with macular edema: Secondary | ICD-10-CM | POA: Diagnosis not present

## 2018-07-01 DIAGNOSIS — M79641 Pain in right hand: Secondary | ICD-10-CM | POA: Diagnosis not present

## 2018-07-23 ENCOUNTER — Telehealth: Payer: Self-pay | Admitting: Internal Medicine

## 2018-07-23 NOTE — Telephone Encounter (Signed)
Needs to set up for AMW in February

## 2018-07-23 NOTE — Telephone Encounter (Signed)
Patient wanted an afternoon appointment.  The first available was 12/05/18. Patient scheduled appointment.

## 2018-07-24 NOTE — Telephone Encounter (Signed)
That is soon enough

## 2018-07-26 DIAGNOSIS — H401132 Primary open-angle glaucoma, bilateral, moderate stage: Secondary | ICD-10-CM | POA: Diagnosis not present

## 2018-09-16 DIAGNOSIS — H34831 Tributary (branch) retinal vein occlusion, right eye, with macular edema: Secondary | ICD-10-CM | POA: Diagnosis not present

## 2018-09-16 LAB — HM DIABETES EYE EXAM

## 2018-09-23 ENCOUNTER — Other Ambulatory Visit: Payer: Self-pay | Admitting: Internal Medicine

## 2018-10-03 ENCOUNTER — Encounter: Payer: Self-pay | Admitting: Internal Medicine

## 2018-10-03 DIAGNOSIS — H401132 Primary open-angle glaucoma, bilateral, moderate stage: Secondary | ICD-10-CM | POA: Diagnosis not present

## 2018-10-25 DIAGNOSIS — H401132 Primary open-angle glaucoma, bilateral, moderate stage: Secondary | ICD-10-CM | POA: Diagnosis not present

## 2018-10-29 ENCOUNTER — Other Ambulatory Visit: Payer: Self-pay | Admitting: Internal Medicine

## 2018-11-04 ENCOUNTER — Other Ambulatory Visit: Payer: Self-pay | Admitting: Internal Medicine

## 2018-11-29 ENCOUNTER — Telehealth: Payer: Self-pay | Admitting: Internal Medicine

## 2018-11-29 NOTE — Telephone Encounter (Signed)
I left a detailed message on patient's v.m. appointment cancelled and I'll call her back to r/s appointment.

## 2018-12-03 DIAGNOSIS — H401132 Primary open-angle glaucoma, bilateral, moderate stage: Secondary | ICD-10-CM | POA: Diagnosis not present

## 2018-12-05 ENCOUNTER — Encounter: Payer: Medicare Other | Admitting: Internal Medicine

## 2018-12-05 ENCOUNTER — Other Ambulatory Visit: Payer: Self-pay | Admitting: Internal Medicine

## 2018-12-31 ENCOUNTER — Other Ambulatory Visit: Payer: Self-pay | Admitting: Internal Medicine

## 2018-12-31 DIAGNOSIS — E119 Type 2 diabetes mellitus without complications: Secondary | ICD-10-CM

## 2018-12-31 NOTE — Telephone Encounter (Signed)
Needs OV . LOV was 10/2017

## 2019-01-07 DIAGNOSIS — M2041 Other hammer toe(s) (acquired), right foot: Secondary | ICD-10-CM | POA: Diagnosis not present

## 2019-01-07 DIAGNOSIS — L97521 Non-pressure chronic ulcer of other part of left foot limited to breakdown of skin: Secondary | ICD-10-CM | POA: Diagnosis not present

## 2019-01-07 DIAGNOSIS — M059 Rheumatoid arthritis with rheumatoid factor, unspecified: Secondary | ICD-10-CM | POA: Diagnosis not present

## 2019-01-07 DIAGNOSIS — M79672 Pain in left foot: Secondary | ICD-10-CM | POA: Diagnosis not present

## 2019-01-07 DIAGNOSIS — M79671 Pain in right foot: Secondary | ICD-10-CM | POA: Diagnosis not present

## 2019-01-11 ENCOUNTER — Other Ambulatory Visit: Payer: Self-pay

## 2019-01-11 NOTE — Telephone Encounter (Signed)
Last filled 11-04-18 #180 Last OV 02-05-18 No Future OV [facility]  Please forward to [place] to schedule virtual visit after filled. Thanks

## 2019-01-12 MED ORDER — CYCLOBENZAPRINE HCL 10 MG PO TABS: 10.0000 mg | ORAL_TABLET | Freq: Three times a day (TID) | ORAL | 0 refills | Status: DC | PRN

## 2019-01-13 ENCOUNTER — Ambulatory Visit (INDEPENDENT_AMBULATORY_CARE_PROVIDER_SITE_OTHER): Payer: Medicare Other | Admitting: Internal Medicine

## 2019-01-13 ENCOUNTER — Encounter: Payer: Self-pay | Admitting: Internal Medicine

## 2019-01-13 VITALS — Ht 64.0 in | Wt 135.0 lb

## 2019-01-13 DIAGNOSIS — M059 Rheumatoid arthritis with rheumatoid factor, unspecified: Secondary | ICD-10-CM | POA: Diagnosis not present

## 2019-01-13 DIAGNOSIS — F3174 Bipolar disorder, in full remission, most recent episode manic: Secondary | ICD-10-CM

## 2019-01-13 DIAGNOSIS — B070 Plantar wart: Secondary | ICD-10-CM | POA: Diagnosis not present

## 2019-01-13 DIAGNOSIS — E119 Type 2 diabetes mellitus without complications: Secondary | ICD-10-CM

## 2019-01-13 DIAGNOSIS — K219 Gastro-esophageal reflux disease without esophagitis: Secondary | ICD-10-CM | POA: Diagnosis not present

## 2019-01-13 NOTE — Progress Notes (Signed)
Subjective:    Patient ID: Diana Roberson, female    DOB: December 18, 1945, 73 y.o.   MRN: [id number]  HPI Virtual visit for follow up of diabetes and other chronic health conditions Identification done Reviewed billing and she gave consent She is in her home---husband helping with the visit (technical) and I am in my office  She is doing well Walking regularly  Checks sugars  Usually in 160's Does have a plantar wart--went to podiatrist No Rx done---just told her to get inserts Sensation is normal  No mania No depression Continues on both seroquel and geodon  Sees Dr Renard Matter for the RA No recent labs Still takes the MTX every week  Has been to cut back on the famotidine Usually once a day No heartburn or dysphagia on this  Current Outpatient Medications on File Prior to Visit  Medication Sig Dispense Refill  . aspirin 81 MG tablet Take 81 mg by mouth every other day.      Marland Kitchen atorvastatin (LIPITOR) 20 MG tablet TAKE 1 TABLET BY MOUTH  DAILY 90 tablet 1  . calcium gluconate 500 MG tablet Take 1,000 mg by mouth daily.     . cholecalciferol (VITAMIN D) 1000 UNITS tablet Take 1,000 Units by mouth daily.    . cyclobenzaprine (FLEXERIL) 10 MG tablet Take 1 tablet (10 mg total) by mouth 3 (three) times daily as needed for muscle spasms. 180 tablet 0  . famotidine (PEPCID) 20 MG tablet TAKE 1 TABLET BY MOUTH TWO  TIMES DAILY (Patient taking differently: Take 20 mg by mouth daily. ) 180 tablet 3  . folic acid (FOLVITE) 1 MG tablet Take 1 tablet (1 mg total) by mouth daily. 90 tablet 3  . lisinopril (ZESTRIL) 2.5 MG tablet TAKE 1 TABLET BY MOUTH  DAILY 90 tablet 0  . metFORMIN (GLUCOPHAGE) 500 MG tablet TAKE 1 TABLET BY MOUTH  TWICE A DAY 180 tablet 1  . methotrexate (RHEUMATREX) 2.5 MG tablet TAKE 6 TABLETS BY MOUTH  ONCE A WEEK.  CAUTION:CHEMOTHERAPY.  PROTECT FROM LIGHT. (Patient taking differently: Take 17.5 mg by mouth. ) 78 tablet 3  . QUEtiapine (SEROQUEL) 50 MG tablet TAKE 1 TABLET  BY MOUTH AT  BEDTIME 90 tablet 3  . vitamin B-12 (CYANOCOBALAMIN) 1000 MCG tablet Take 1,000 mcg by mouth daily.      . vitamin E 400 UNIT capsule Take 400 Units by mouth daily.      . ziprasidone (GEODON) 80 MG capsule TAKE 1 TO 2 CAPSULES BY  MOUTH DAILY 180 capsule 2   No current facility-administered medications on file prior to visit.     Allergies  Allergen Reactions  . Codeine Sulfate     Indigestion and pain  . Sertraline Hcl     REACTION: Worse, ? irritable    Past Medical History:  Diagnosis Date  . Allergy   . Asthma   . Bipolar 1 disorder (HCC)   . Chronic back pain   . Diabetes mellitus   . Diverticulitis   . Glaucoma   . Hyperlipidemia   . Mental health disorder    admitted x3  . Osteopenia   . Rheumatoid arthritis [facility])     Past Surgical History:  Procedure Laterality Date  . BACK SURGERY  1988  . BREAST CYST ASPIRATION     neg  . CHOLECYSTECTOMY    . COLONOSCOPY    . COLONOSCOPY WITH PROPOFOL N/A 11/27/2017   Procedure: COLONOSCOPY WITH PROPOFOL;  Surgeon: Barnetta Chapel  U, MD;  Location: [facility] ENDOSCOPY;  Service: Endoscopy;  Laterality: N/A;  . HAND SURGERY      Family History  Problem Relation Age of Onset  . Diabetes Mother   . Hypertension Mother   . Stroke Sister   . Hashimoto's thyroiditis Daughter   . Stroke Father   . Stroke Sister   . Cancer Neg Hx   . Breast cancer Neg Hx     Social History   Socioeconomic History  . Marital status: Married    Spouse name: Not on file  . Number of children: 2  . Years of education: Not on file  . Highest education level: Not on file  Occupational History  . Occupation: formerly Designer, jewellery for [employer]  . Financial resource strain: Not on file  . Food insecurity:    Worry: Not on file    Inability: Not on file  . Transportation needs:    Medical: Not on file    Non-medical: Not on file  Tobacco Use  . Smoking status: Never Smoker  . Smokeless tobacco:  Never Used  Substance and Sexual Activity  . Alcohol use: No  . Drug use: No  . Sexual activity: Not on file  Lifestyle  . Physical activity:    Days per week: Not on file    Minutes per session: Not on file  . Stress: Not on file  Relationships  . Social connections:    Talks on phone: Not on file    Gets together: Not on file    Attends religious service: Not on file    Active member of club or organization: Not on file    Attends meetings of clubs or organizations: Not on file    Relationship status: Not on file  . Intimate partner violence:    Fear of current or ex partner: Not on file    Emotionally abused: Not on file    Physically abused: Not on file    Forced sexual activity: Not on file  Other Topics Concern  . Not on file  Social History Narrative   Has living will   Husband is health care POA. Alternate is daughter [name]   Would accept resuscitation attempts   Would not want feeding tube if cognitively unaware   Review of Systems Appetite is good Lost some weight but gained it back Sleeping well Bowels are fine    Objective:   Physical Exam  Constitutional: She appears well-developed. No distress.  Respiratory: Effort normal. No respiratory distress.  Musculoskeletal:     Comments: Has what appears to be a wart over the base of the plantar left 3rd metatarsal No ulcers  Psychiatric: She has a normal mood and affect. Her behavior is normal.           Assessment & Plan:

## 2019-01-13 NOTE — Assessment & Plan Note (Signed)
Overdue for A1c Hopefully still good No clear complications

## 2019-01-13 NOTE — Assessment & Plan Note (Signed)
Discussed trying home Rx

## 2019-01-13 NOTE — Assessment & Plan Note (Signed)
Doing well with 2 antipsychotic regimen left over from last psychiatrist Wean not indicated

## 2019-01-13 NOTE — Assessment & Plan Note (Signed)
Controlled with daily pepcid

## 2019-01-13 NOTE — Assessment & Plan Note (Signed)
Continues on MTX Sees Dr Renard Matter

## 2019-01-14 ENCOUNTER — Other Ambulatory Visit (INDEPENDENT_AMBULATORY_CARE_PROVIDER_SITE_OTHER): Payer: Medicare Other

## 2019-01-14 DIAGNOSIS — E119 Type 2 diabetes mellitus without complications: Secondary | ICD-10-CM | POA: Diagnosis not present

## 2019-01-14 DIAGNOSIS — M059 Rheumatoid arthritis with rheumatoid factor, unspecified: Secondary | ICD-10-CM | POA: Diagnosis not present

## 2019-01-14 LAB — LIPID PANEL
Cholesterol: 115 mg/dL (ref 0–200)
HDL: 48.2 mg/dL (ref >39.00)
LDL Cholesterol: 49 mg/dL (ref 0–99)
NonHDL: 67.26
Total CHOL/HDL Ratio: 2
Triglycerides: 92 mg/dL (ref 0.0–149.0)
VLDL: 18.4 mg/dL (ref 0.0–40.0)

## 2019-01-14 LAB — COMPREHENSIVE METABOLIC PANEL
ALT: 14 U/L (ref 0–35)
AST: 16 U/L (ref 0–37)
Albumin: 3.9 g/dL (ref 3.5–5.2)
Alkaline Phosphatase: 85 U/L (ref 39–117)
BUN: 17 mg/dL (ref 6–23)
CO2: 27 mEq/L (ref 19–32)
Calcium: 9.3 mg/dL (ref 8.4–10.5)
Chloride: 106 mEq/L (ref 96–112)
Creatinine, Ser: 0.8 mg/dL (ref 0.40–1.20)
GFR: 70.14 mL/min (ref >60.00)
Glucose, Bld: 125 mg/dL — ABNORMAL HIGH (ref 70–99)
Potassium: 4.8 mEq/L (ref 3.5–5.1)
Sodium: 139 mEq/L (ref 135–145)
Total Bilirubin: 0.4 mg/dL (ref 0.2–1.2)
Total Protein: 6.8 g/dL (ref 6.0–8.3)

## 2019-01-14 LAB — CBC
HCT: 32.1 % — ABNORMAL LOW (ref 36.0–46.0)
Hemoglobin: 11.1 g/dL — ABNORMAL LOW (ref 12.0–15.0)
MCHC: 34.6 g/dL (ref 30.0–36.0)
MCV: 91.8 fl (ref 78.0–100.0)
Platelets: 275 10*3/uL (ref 150.0–400.0)
RBC: 3.5 Mil/uL — ABNORMAL LOW (ref 3.87–5.11)
RDW: 16.8 % — ABNORMAL HIGH (ref 11.5–15.5)
WBC: 6.3 10*3/uL (ref 4.0–10.5)

## 2019-01-14 LAB — SEDIMENTATION RATE: Sed Rate: 11 mm/hr (ref 0–30)

## 2019-01-14 LAB — HEMOGLOBIN A1C: Hgb A1c MFr Bld: 7.2 % — ABNORMAL HIGH (ref 4.6–6.5)

## 2019-01-15 ENCOUNTER — Telehealth: Payer: Self-pay | Admitting: *Deleted

## 2019-01-15 MED ORDER — ONETOUCH DELICA LANCING DEV MISC: 1.0000 | Freq: Once | 3 refills | Status: AC

## 2019-01-15 MED ORDER — ONETOUCH DELICA LANCETS 33G MISC: 1.0000 | Freq: Every day | 3 refills | Status: AC

## 2019-01-15 MED ORDER — GLUCOSE BLOOD VI STRP: ORAL_STRIP | 3 refills | Status: AC

## 2019-01-15 MED ORDER — ONETOUCH VERIO IQ SYSTEM W/DEVICE KIT: 1.0000 | PACK | Freq: Once | 0 refills | Status: AC

## 2019-01-15 NOTE — Telephone Encounter (Signed)
Pt's husband has same insurance and uses the [facility] psychotherapist. I will send that to [facility].

## 2019-01-15 NOTE — Telephone Encounter (Signed)
Patient called to speak with [facility]. Patient stated that Dr. Alphonsus Sias told her the other day that Carollee Herter would be in touch with her to get her a new meter. Patient requested a call back.

## 2019-01-22 ENCOUNTER — Telehealth: Payer: Self-pay

## 2019-01-22 ENCOUNTER — Other Ambulatory Visit: Payer: Self-pay

## 2019-01-22 NOTE — Telephone Encounter (Signed)
Spoke to pt. Told her what her labs were and what Dr Alphonsus Sias said.

## 2019-01-22 NOTE — Patient Outreach (Signed)
[facility] Customer service manager [facility]) Care Management  01/22/2019  Diana Roberson November 30, 1945 [id number]   Medication Adherence call to [name] Hippa Identifiers Verify spoke with patient she is due on Metformin 500 mg she explain she has already order this medication from [facility] an is expecting it soon patient also said her sugar was a bit high and was going to get in touch with her doctor to see if he will either increase on Metformin or switch to something else. [name] is showing past due under [facility] Ins.   Lillia Abed CPhT Pharmacy Technician [facility] Care Management Direct Dial [phone]  Fax [phone] [email or web address]

## 2019-01-22 NOTE — Telephone Encounter (Signed)
Pt left v/m that she could not open up email for lab results. Pt request cb.

## 2019-01-28 DIAGNOSIS — M059 Rheumatoid arthritis with rheumatoid factor, unspecified: Secondary | ICD-10-CM | POA: Diagnosis not present

## 2019-01-28 DIAGNOSIS — M2012 Hallux valgus (acquired), left foot: Secondary | ICD-10-CM | POA: Diagnosis not present

## 2019-01-28 DIAGNOSIS — M2041 Other hammer toe(s) (acquired), right foot: Secondary | ICD-10-CM | POA: Diagnosis not present

## 2019-01-28 DIAGNOSIS — M2011 Hallux valgus (acquired), right foot: Secondary | ICD-10-CM | POA: Diagnosis not present

## 2019-01-28 DIAGNOSIS — M2042 Other hammer toe(s) (acquired), left foot: Secondary | ICD-10-CM | POA: Diagnosis not present

## 2019-02-04 ENCOUNTER — Ambulatory Visit (INDEPENDENT_AMBULATORY_CARE_PROVIDER_SITE_OTHER): Payer: Medicare Other | Admitting: Family Medicine

## 2019-02-04 ENCOUNTER — Encounter: Payer: Self-pay | Admitting: Family Medicine

## 2019-02-04 ENCOUNTER — Other Ambulatory Visit: Payer: Medicare Other

## 2019-02-04 VITALS — Ht 64.0 in | Wt 135.0 lb

## 2019-02-04 DIAGNOSIS — R300 Dysuria: Secondary | ICD-10-CM

## 2019-02-04 LAB — POC URINALSYSI DIPSTICK (AUTOMATED)
Bilirubin, UA: NEGATIVE
Blood, UA: NEGATIVE
Glucose, UA: NEGATIVE
Ketones, UA: NEGATIVE
Nitrite, UA: NEGATIVE
Protein, UA: NEGATIVE
Spec Grav, UA: 1.01 (ref 1.010–1.025)
Urobilinogen, UA: 0.2 E.U./dL
pH, UA: 8 (ref 5.0–8.0)

## 2019-02-04 MED ORDER — SULFAMETHOXAZOLE-TRIMETHOPRIM 800-160 MG PO TABS: 1.0000 | ORAL_TABLET | Freq: Two times a day (BID) | ORAL | 0 refills | Status: DC

## 2019-02-04 NOTE — Assessment & Plan Note (Signed)
Likely UTI. Treat with bactrim BID x 3 days as uncomplicated.  Send for culture to verify and identify sensitivities.  keep up fluids.

## 2019-02-04 NOTE — Progress Notes (Signed)
Urine sent for culture as instructed by Dr. Ermalene Searing.

## 2019-02-04 NOTE — Progress Notes (Signed)
VIRTUAL VISIT Due to national recommendations of social distancing due to COVID 19, a virtual visit is felt to be most appropriate for this patient at this time.   I connected with the patient on 02/04/19 at  2:50 PM EDT by virtual telehealth platform and verified that I am speaking with the correct person using two identifiers.   I discussed the limitations, risks, security and privacy concerns of performing an evaluation and management service by  virtual telehealth platform and the availability of in person appointments. I also discussed with the patient that there may be a patient responsible charge related to this service. The patient expressed understanding and agreed to proceed.  Patient location: Home Provider Location: [place] Participants: Kerby Nora and [name]   Chief Complaint  Patient presents with  . Dysuria    History of Present Illness: 73 year old female patient of Dr. Karle Starch presents with new onset dysuria.   Dysuria   This is a new problem. The current episode started in the past 7 days. The problem has been unchanged. The quality of the pain is described as burning. There has been no fever. She is sexually active. Pertinent negatives include no chills, discharge, flank pain, frequency, hematuria, hesitancy, nausea, urgency or vomiting. She has tried increased fluids for the symptoms. The treatment provided mild relief. Her past medical history is significant for urinary stasis. There is no history of catheterization, kidney stones, a single kidney or a urological procedure.    Last UTI   several years ago. No history of recurrent UTI.  Urinalysis    Component Value Date/Time   COLORURINE yellow 09/19/2007 1215   APPEARANCEUR Hazy 09/19/2007 1215   LABSPEC <1.005 09/19/2007 1215   PHURINE 8.5 09/19/2007 1215   HGBUR small 09/19/2007 1215   BILIRUBINUR Negative 02/04/2019 1452   PROTEINUR Negative 02/04/2019 1452   UROBILINOGEN 0.2  02/04/2019 1452   UROBILINOGEN 0.2 09/19/2007 1215   NITRITE Negative 02/04/2019 1452   NITRITE negative 09/19/2007 1215   LEUKOCYTESUR Small (1+) (A) 02/04/2019 1452     COVID 19 screen No recent travel or known exposure to COVID19 The patient denies respiratory symptoms of COVID 19 at this time.  The importance of social distancing was discussed today.   Review of Systems  Constitutional: Negative for chills.  Gastrointestinal: Negative for nausea and vomiting.  Genitourinary: Positive for dysuria. Negative for flank pain, frequency, hematuria, hesitancy and urgency.      Past Medical History:  Diagnosis Date  . Allergy   . Asthma   . Bipolar 1 disorder (HCC)   . Chronic back pain   . Diabetes mellitus   . Diverticulitis   . Glaucoma   . Hyperlipidemia   . Mental health disorder    admitted x3  . Osteopenia   . Rheumatoid arthritis (HCC)     reports that she has never smoked. She has never used smokeless tobacco. She reports that she does not drink alcohol or use drugs.   Current Outpatient Medications:  .  aspirin 81 MG tablet, Take 81 mg by mouth every other day.  , Disp: , Rfl:  .  atorvastatin (LIPITOR) 20 MG tablet, TAKE 1 TABLET BY MOUTH  DAILY, Disp: 90 tablet, Rfl: 1 .  calcium gluconate 500 MG tablet, Take 1,000 mg by mouth daily. , Disp: , Rfl:  .  cholecalciferol (VITAMIN D) 1000 UNITS tablet, Take 1,000 Units by mouth daily., Disp: , Rfl:  .  cyclobenzaprine (FLEXERIL)  10 MG tablet, Take 1 tablet (10 mg total) by mouth 3 (three) times daily as needed for muscle spasms., Disp: 180 tablet, Rfl: 0 .  famotidine (PEPCID) 20 MG tablet, TAKE 1 TABLET BY MOUTH TWO  TIMES DAILY (Patient taking differently: Take 20 mg by mouth daily. ), Disp: 180 tablet, Rfl: 3 .  folic acid (FOLVITE) 1 MG tablet, Take 1 tablet (1 mg total) by mouth daily., Disp: 90 tablet, Rfl: 3 .  glucose blood (ONETOUCH VERIO) test strip, Use to check blood sugar once a day. Dx Code E11.9, Disp:  100 each, Rfl: 3 .  lisinopril (ZESTRIL) 2.5 MG tablet, TAKE 1 TABLET BY MOUTH  DAILY, Disp: 90 tablet, Rfl: 0 .  metFORMIN (GLUCOPHAGE) 500 MG tablet, TAKE 1 TABLET BY MOUTH  TWICE A DAY, Disp: 180 tablet, Rfl: 1 .  methotrexate (RHEUMATREX) 2.5 MG tablet, TAKE 6 TABLETS BY MOUTH  ONCE A WEEK.  CAUTION:CHEMOTHERAPY.  PROTECT FROM LIGHT. (Patient taking differently: Take 17.5 mg by mouth. ), Disp: 78 tablet, Rfl: 3 .  OneTouch Delica Lancets 33G MISC, 1 each by Does not apply route daily. Dx Code E11.9, Disp: 100 each, Rfl: 3 .  QUEtiapine (SEROQUEL) 50 MG tablet, TAKE 1 TABLET BY MOUTH AT  BEDTIME, Disp: 90 tablet, Rfl: 3 .  vitamin B-12 (CYANOCOBALAMIN) 1000 MCG tablet, Take 1,000 mcg by mouth daily.  , Disp: , Rfl:  .  vitamin E 400 UNIT capsule, Take 400 Units by mouth daily.  , Disp: , Rfl:  .  ziprasidone (GEODON) 80 MG capsule, TAKE 1 TO 2 CAPSULES BY  MOUTH DAILY, Disp: 180 capsule, Rfl: 2   Observations/Objective: Height 5\' 4"  (1.626 m), weight 135 lb (61.2 kg).  Physical Exam  Physical Exam Constitutional:      General: The patient is not in acute distress. Pulmonary:     Effort: Pulmonary effort is normal. No respiratory distress.  Neurological:     Mental Status: The patient is alert and oriented to person, place, and time.  Psychiatric:        Mood and Affect: Mood normal.        Behavior: Behavior normal.   Assessment and Plan Dysuria Likely UTI. Treat with bactrim BID x 3 days as uncomplicated.  Send for culture to verify and identify sensitivities.  keep up fluids.     I discussed the assessment and treatment plan with the patient. The patient was provided an opportunity to ask questions and all were answered. The patient agreed with the plan and demonstrated an understanding of the instructions.   The patient was advised to call back or seek an in-person evaluation if the symptoms worsen or if the condition fails to improve as anticipated.     Kerby Nora, MD

## 2019-02-06 LAB — URINE CULTURE
MICRO NUMBER:: 505398
SPECIMEN QUALITY:: ADEQUATE

## 2019-02-10 DIAGNOSIS — H34831 Tributary (branch) retinal vein occlusion, right eye, with macular edema: Secondary | ICD-10-CM | POA: Diagnosis not present

## 2019-03-24 ENCOUNTER — Other Ambulatory Visit: Payer: Self-pay | Admitting: Internal Medicine

## 2019-03-24 DIAGNOSIS — E119 Type 2 diabetes mellitus without complications: Secondary | ICD-10-CM

## 2019-04-24 ENCOUNTER — Other Ambulatory Visit: Payer: Self-pay

## 2019-04-24 NOTE — Patient Outreach (Signed)
[facility]) Care Management  04/24/2019  GABRIELLE WAKELAND 1945-11-16 [id number]   Medication Adherence call to Mrs. [place] Compliant Voice message left with a call back number. Mrs. Rowe is showing past due on Metformin under [facility].  [facility] Management Direct Dial [phone]  Fax [phone] [email or web address]

## 2019-04-30 DIAGNOSIS — H401132 Primary open-angle glaucoma, bilateral, moderate stage: Secondary | ICD-10-CM | POA: Diagnosis not present

## 2019-05-01 ENCOUNTER — Other Ambulatory Visit: Payer: Self-pay | Admitting: Internal Medicine

## 2019-05-05 DIAGNOSIS — H401132 Primary open-angle glaucoma, bilateral, moderate stage: Secondary | ICD-10-CM | POA: Diagnosis not present

## 2019-05-05 LAB — HM DIABETES EYE EXAM

## 2019-05-14 ENCOUNTER — Other Ambulatory Visit: Payer: Self-pay | Admitting: Internal Medicine

## 2019-05-16 ENCOUNTER — Encounter: Payer: Self-pay | Admitting: Internal Medicine

## 2019-05-23 DIAGNOSIS — M059 Rheumatoid arthritis with rheumatoid factor, unspecified: Secondary | ICD-10-CM | POA: Diagnosis not present

## 2019-05-23 DIAGNOSIS — Z79899 Other long term (current) drug therapy: Secondary | ICD-10-CM | POA: Diagnosis not present

## 2019-06-10 DIAGNOSIS — H34831 Tributary (branch) retinal vein occlusion, right eye, with macular edema: Secondary | ICD-10-CM | POA: Diagnosis not present

## 2019-06-14 ENCOUNTER — Other Ambulatory Visit: Payer: Self-pay

## 2019-06-14 ENCOUNTER — Encounter: Payer: Self-pay | Admitting: Emergency Medicine

## 2019-06-14 ENCOUNTER — Emergency Department
Admission: EM | Admit: 2019-06-14 | Discharge: 2019-06-14 | Disposition: A | Discharge status: Home / Self Care | Payer: Medicare Other | Attending: Student | Admitting: Student

## 2019-06-14 ENCOUNTER — Emergency Department: Payer: Medicare Other

## 2019-06-14 DIAGNOSIS — J45909 Unspecified asthma, uncomplicated: Secondary | ICD-10-CM | POA: Insufficient documentation

## 2019-06-14 DIAGNOSIS — Z7984 Long term (current) use of oral hypoglycemic drugs: Secondary | ICD-10-CM | POA: Diagnosis not present

## 2019-06-14 DIAGNOSIS — S52034A Nondisplaced fracture of olecranon process with intraarticular extension of right ulna, initial encounter for closed fracture: Secondary | ICD-10-CM | POA: Insufficient documentation

## 2019-06-14 DIAGNOSIS — Y9301 Activity, walking, marching and hiking: Secondary | ICD-10-CM | POA: Insufficient documentation

## 2019-06-14 DIAGNOSIS — Y929 Unspecified place or not applicable: Secondary | ICD-10-CM | POA: Insufficient documentation

## 2019-06-14 DIAGNOSIS — W19XXXA Unspecified fall, initial encounter: Secondary | ICD-10-CM

## 2019-06-14 DIAGNOSIS — Z7982 Long term (current) use of aspirin: Secondary | ICD-10-CM | POA: Diagnosis not present

## 2019-06-14 DIAGNOSIS — S52021A Displaced fracture of olecranon process without intraarticular extension of right ulna, initial encounter for closed fracture: Secondary | ICD-10-CM | POA: Diagnosis not present

## 2019-06-14 DIAGNOSIS — E119 Type 2 diabetes mellitus without complications: Secondary | ICD-10-CM | POA: Diagnosis not present

## 2019-06-14 DIAGNOSIS — W010XXA Fall on same level from slipping, tripping and stumbling without subsequent striking against object, initial encounter: Secondary | ICD-10-CM | POA: Diagnosis not present

## 2019-06-14 DIAGNOSIS — Y999 Unspecified external cause status: Secondary | ICD-10-CM | POA: Diagnosis not present

## 2019-06-14 DIAGNOSIS — S52031A Displaced fracture of olecranon process with intraarticular extension of right ulna, initial encounter for closed fracture: Secondary | ICD-10-CM | POA: Diagnosis not present

## 2019-06-14 DIAGNOSIS — S59902A Unspecified injury of left elbow, initial encounter: Secondary | ICD-10-CM | POA: Diagnosis present

## 2019-06-14 MED ORDER — HYDROCODONE-ACETAMINOPHEN 5-325 MG PO TABS: 1.0000 | ORAL_TABLET | Freq: Three times a day (TID) | ORAL | 0 refills | Status: AC | PRN

## 2019-06-14 NOTE — ED Provider Notes (Signed)
[facility] Emergency Department Provider Note ____________________________________________  Time seen: 1613  I have reviewed the triage vital signs and the nursing notes.  HISTORY  Chief Complaint  Fall  HPI Diana Roberson is a 73 y.o. female presents to the ED accompanied by her husband, for evaluation of fall mechanical fall.  Patient describes she twisted and fell after she rolled her ankle and landed on her right elbow about 1130 this morning.  Since that time she had immediate pain, swelling, and disability to the right elbow.  She presents now with significant swelling and effusion to the right elbow as well as some early bruising.  She denies any head pain, neck pain, shoulder pain, or distal right upper extremity pain.  She does have a history of rheumatoid arthritis.   Past Medical History:  Diagnosis Date  . Allergy   . Asthma   . Bipolar 1 disorder (HCC)   . Chronic back pain   . Diabetes mellitus   . Diverticulitis   . Glaucoma   . Hyperlipidemia   . Mental health disorder    admitted x3  . Osteopenia   . Rheumatoid arthritis [facility])     Patient Active Problem List   Diagnosis Date Noted  . Dysuria 02/04/2019  . Plantar wart of left foot 01/13/2019  . Gastroesophageal reflux disease 11/01/2017  . Controlled type 2 diabetes mellitus without complication (HCC) 12/28/2015  . Seropositive rheumatoid arthritis (HCC) 12/28/2015  . Glaucoma 04/30/2015  . Advance directive discussed with patient 04/30/2015  . Routine general medical examination at a health care facility 12/06/2012  . CAROTID BRUIT 11/08/2010  . TIA 10/31/2010  . Bipolar 1 disorder, manic, full remission (HCC) 10/01/2009  . OSTEOPENIA 07/26/2007  . Hyperlipemia 01/22/2007  . DIVERTICULOSIS, COLON 01/08/2007  . Chronic back pain 01/08/2007    Past Surgical History:  Procedure Laterality Date  . BACK SURGERY  1988  . BREAST CYST ASPIRATION     neg  . CHOLECYSTECTOMY    .  COLONOSCOPY    . COLONOSCOPY WITH PROPOFOL N/A 11/27/2017   Procedure: COLONOSCOPY WITH PROPOFOL;  Surgeon: Christena Deem, MD;  Location: [facility] ENDOSCOPY;  Service: Endoscopy;  Laterality: N/A;  . HAND SURGERY      Prior to Admission medications   Medication Sig Start Date End Date Taking? Authorizing Provider  aspirin 81 MG tablet Take 81 mg by mouth every other day.      [provider]  atorvastatin (LIPITOR) 20 MG tablet TAKE 1 TABLET BY MOUTH  DAILY 05/01/19   Tillman Abide I, MD  calcium gluconate 500 MG tablet Take 1,000 mg by mouth daily.     [provider]  cholecalciferol (VITAMIN D) 1000 UNITS tablet Take 1,000 Units by mouth daily.    [provider]  cyclobenzaprine (FLEXERIL) 10 MG tablet TAKE 1 TABLET BY MOUTH 3  TIMES DAILY AS NEEDED FOR  MUSCLE SPASM(S) 05/15/19   Karie Schwalbe, MD  famotidine (PEPCID) 20 MG tablet TAKE 1 TABLET BY MOUTH TWO  TIMES DAILY Patient taking differently: Take 20 mg by mouth daily.  09/23/18   Karie Schwalbe, MD  folic acid (FOLVITE) 1 MG tablet Take 1 tablet (1 mg total) by mouth daily. 01/07/18   Tillman Abide I, MD  glucose blood (ONETOUCH VERIO) test strip Use to check blood sugar once a day. Dx Code E11.9 01/15/19   Karie Schwalbe, MD  HYDROcodone-acetaminophen (NORCO) 5-325 MG tablet Take 1 tablet by  mouth 3 (three) times daily as needed for up to 3 days. 06/14/19 06/17/19  Menna Abeln, Charlesetta Ivory, PA-C  lisinopril (ZESTRIL) 2.5 MG tablet TAKE 1 TABLET BY MOUTH  DAILY 03/24/19   Tillman Abide I, MD  metFORMIN (GLUCOPHAGE) 500 MG tablet TAKE 1 TABLET BY MOUTH  TWICE A DAY 12/05/18   Karie Schwalbe, MD  methotrexate (RHEUMATREX) 2.5 MG tablet TAKE 6 TABLETS BY MOUTH  ONCE A WEEK.  CAUTION:CHEMOTHERAPY.  PROTECT FROM LIGHT. Patient taking differently: Take 17.5 mg by mouth.  05/31/17   Karie Schwalbe, MD  OneTouch Delica Lancets 33G MISC 1 each by Does not apply route daily. Dx Code E11.9 01/15/19   Karie Schwalbe, MD  QUEtiapine (SEROQUEL) 50 MG tablet TAKE 1 TABLET BY MOUTH AT  BEDTIME 10/29/18   Tillman Abide I, MD  sulfamethoxazole-trimethoprim (BACTRIM DS) 800-160 MG tablet Take 1 tablet by mouth 2 (two) times daily. 02/04/19   Bedsole, Amy E, MD  vitamin B-12 (CYANOCOBALAMIN) 1000 MCG tablet Take 1,000 mcg by mouth daily.      [provider]  vitamin E 400 UNIT capsule Take 400 Units by mouth daily.      [provider]  ziprasidone (GEODON) 80 MG capsule TAKE 1 TO 2 CAPSULES BY  MOUTH DAILY 05/15/19   Karie Schwalbe, MD    Allergies Codeine sulfate and Sertraline hcl  Family History  Problem Relation Age of Onset  . Diabetes Mother   . Hypertension Mother   . Stroke Sister   . Hashimoto's thyroiditis Daughter   . Stroke Father   . Stroke Sister   . Cancer Neg Hx   . Breast cancer Neg Hx     Social History Social History   Tobacco Use  . Smoking status: Never Smoker  . Smokeless tobacco: Never Used  Substance Use Topics  . Alcohol use: No  . Drug use: No    Review of Systems  Constitutional: Negative for fever. Eyes: Negative for visual changes. ENT: Negative for sore throat. Cardiovascular: Negative for chest pain. Respiratory: Negative for shortness of breath. Gastrointestinal: Negative for abdominal pain, vomiting and diarrhea. Genitourinary: Negative for dysuria. Musculoskeletal: Negative for back pain.  Right elbow pain as above. Skin: Negative for rash. Neurological: Negative for headaches, focal weakness or numbness. ____________________________________________  PHYSICAL EXAM:  VITAL SIGNS: ED Triage Vitals  Enc Vitals Group     BP 06/14/19 1551 (!) 159/100     Pulse Rate 06/14/19 1551 92     Resp 06/14/19 1551 18     Temp 06/14/19 1551 99 F (37.2 C)     Temp Source 06/14/19 1551 Oral     SpO2 06/14/19 1551 96 %     Weight 06/14/19 1558 140 lb (63.5 kg)     Height 06/14/19 1558 5\' 4"  (1.626 m)     Head Circumference --       Peak Flow --      Pain Score 06/14/19 1557 10     Pain Loc --      Pain Edu? --      Excl. in GC? --     Constitutional: Alert and oriented. Well appearing and in no distress. Head: Normocephalic and atraumatic. Eyes: Conjunctivae are normal. Normal extraocular movements Cardiovascular: Normal rate, regular rhythm. Normal distal pulses. Respiratory: Normal respiratory effort.  Musculoskeletal: right elbow with moderate effusion. Elbow held in flexed position. Normal composite fist.  Nontender with normal range of motion in all extremities.  Neurologic:  Normal gait without ataxia. Normal speech and language. No gross focal neurologic deficits are appreciated. Skin:  Skin is warm, dry and intact. No rash noted. ____________________________________________   RADIOLOGY  DG Right Elbow  IMPRESSION: Comminuted displaced intra-articular right olecranon fracture with large right elbow joint effusion and prominent posterior right elbow soft tissue swelling.  I, Melvenia Needles, personally viewed and evaluated these images (plain radiographs) as part of my medical decision making, as well as reviewing the written report by the radiologist. ____________________________________________  PROCEDURES  Posterior OCL splint Arm sling Procedures ____________________________________________  INITIAL IMPRESSION / ASSESSMENT AND PLAN / ED COURSE  ----------------------------------------- 5:02 PM on 06/14/2019 -----------------------------------------  S/W Dr. Roland Rack. He suggests splinting and outpatient follow-up  Patient with initial fracture management of a closed displaced olecranon process fracture of the right elbow.  She is appropriately splinted and discharged with fracture care instructions.  A prescription for hydrocodone is provided for moderate to severe pain relief.  She and her husband verbalized understanding of discharge instructions.  Diana Roberson was  evaluated in Emergency Department on 06/14/2019 for the symptoms described in the history of present illness. She was evaluated in the context of the global COVID-19 pandemic, which necessitated consideration that the patient might be at risk for infection with the SARS-CoV-2 virus that causes COVID-19. Institutional protocols and algorithms that pertain to the evaluation of patients at risk for COVID-19 are in a state of rapid change based on information released by regulatory bodies including the CDC and federal and state organizations. These policies and algorithms were followed during the patient's care in the ED.  I reviewed the patient's prescription history over the last 12 months in the multi-state controlled substances database(s) that includes [place], [place], [place], [place], [place], [place], [place], [place], [place], [place], [place], [place], [place], and [place].  Results were notable for no RX history.  ____________________________________________  FINAL CLINICAL IMPRESSION(S) / ED DIAGNOSES  Final diagnoses:  Fall in home, initial encounter  Olecranon fracture, right, closed, initial encounter      Carmie End, Dannielle Karvonen, PA-C 06/14/19 1721    Lilia Pro., MD 06/16/19 [phone]

## 2019-06-14 NOTE — Discharge Instructions (Addendum)
Keep the splint in place, clean, and dry, until you see Dr. Roland Rack, next week. Take OTC Tylenol as needed for pain relief. Take the Norco for moderate-severe pain.

## 2019-06-14 NOTE — ED Triage Notes (Signed)
Pt arrived via POV with reports of twisting ankle and falling onto right elbow around 1130am.  Pt c/o pain to right elbow and forearm. Significant swelling noted to right elbow along with bruising. Pt states different movements make the pain worse.  Pulse intact.

## 2019-06-18 DIAGNOSIS — S52021A Displaced fracture of olecranon process without intraarticular extension of right ulna, initial encounter for closed fracture: Secondary | ICD-10-CM | POA: Diagnosis not present

## 2019-06-18 DIAGNOSIS — W010XXA Fall on same level from slipping, tripping and stumbling without subsequent striking against object, initial encounter: Secondary | ICD-10-CM | POA: Diagnosis not present

## 2019-06-19 ENCOUNTER — Other Ambulatory Visit: Payer: Self-pay | Admitting: Internal Medicine

## 2019-06-20 ENCOUNTER — Other Ambulatory Visit: Payer: Self-pay

## 2019-06-20 ENCOUNTER — Encounter
Admission: RE | Admit: 2019-06-20 | Discharge: 2019-06-20 | Disposition: A | Discharge status: Home / Self Care | Payer: Medicare Other | Source: Ambulatory Visit | Attending: Surgery | Admitting: Surgery

## 2019-06-20 DIAGNOSIS — S52021A Displaced fracture of olecranon process without intraarticular extension of right ulna, initial encounter for closed fracture: Secondary | ICD-10-CM | POA: Insufficient documentation

## 2019-06-20 DIAGNOSIS — E119 Type 2 diabetes mellitus without complications: Secondary | ICD-10-CM | POA: Diagnosis not present

## 2019-06-20 DIAGNOSIS — Z20828 Contact with and (suspected) exposure to other viral communicable diseases: Secondary | ICD-10-CM | POA: Insufficient documentation

## 2019-06-20 DIAGNOSIS — Z01818 Encounter for other preprocedural examination: Secondary | ICD-10-CM | POA: Diagnosis not present

## 2019-06-20 HISTORY — DX: Essential (primary) hypertension: I10

## 2019-06-20 HISTORY — DX: Gastro-esophageal reflux disease without esophagitis: K21.9

## 2019-06-20 LAB — SURGICAL PCR SCREEN
MRSA, PCR: NEGATIVE
Staphylococcus aureus: NEGATIVE

## 2019-06-20 NOTE — Patient Instructions (Signed)
Your procedure is scheduled on: Tues 10/13 Report to Day Surgery. To find out your arrival time please call [phone] between 1PM - 3PM on Mon. 10/12.  Remember: Instructions that are not followed completely may result in serious medical risk,  up to and including death, or upon the discretion of your surgeon and anesthesiologist your  surgery may need to be rescheduled.     _X__ 1. Do not eat food after midnight the night before your procedure.                 No gum chewing or hard candies. You may drink clear liquids up to 2 hours                 before you are scheduled to arrive for your surgery- DO not drink clear                 liquids within 2 hours of the start of your surgery.                 Clear Liquids include:  water, apple juice without pulp, clear carbohydrate                 drink such as Clearfast of Gatorade, Black Coffee or Tea (Do not add                 anything to coffee or tea).  __X__2.  On the morning of surgery brush your teeth with toothpaste and water, you                may rinse your mouth with mouthwash if you wish.  Do not swallow any toothpaste of mouthwash.     ___ 3.  No Alcohol for 24 hours before or after surgery.   ___ 4.  Do Not Smoke or use e-cigarettes For 24 Hours Prior to Your Surgery.                 Do not use any chewable tobacco products for at least 6 hours prior to                 surgery.  ____  5.  Bring all medications with you on the day of surgery if instructed.   __x__  6.  Notify your doctor if there is any change in your medical condition      (cold, fever, infections).     Do not wear jewelry, make-up, hairpins, clips or nail polish. Do not wear lotions, powders, or perfumes. You may wear deodorant. Do not shave 48 hours prior to surgery. Men may shave face and neck. Do not bring valuables to the hospital.    [facility] is not responsible for any belongings or valuables.  Contacts, dentures  or bridgework may not be worn into surgery. Leave your suitcase in the car. After surgery it may be brought to your room. For patients admitted to the hospital, discharge time is determined by your treatment team.   Patients discharged the day of surgery will not be allowed to drive home.   Please read over the following fact sheets that you were given:     _x___ Take these medicines the morning of surgery with A SIP OF WATER:    1. brimonidine (ALPHAGAN) 0.2 % ophthalmic solution  2. dorzolamide-timolol (COSOPT) 22.3-6.8 MG/ML ophthalmic solution  3. famotidine (PEPCID) 20 MG tablet take dose the night before and one the morning of surgery  4.  5.  6.  ____ Fleet Enema (as directed)   _x___ Use CHG Soap as directed  ____ Use inhalers on the day of surgery  _x___ Stop metformin 2 days prior to surgery      last dose tomorrow    ____ Take 1/2 of usual insulin dose the night before surgery. No insulin the morning          of surgery.   __x__ continue aspirin as instructed by MD.  None the morning of surgery   ____ Stop Anti-inflammatories on    __x__ Stop supplements until after surgery.  vitamin E 400 UNIT capsule  ____ Bring C-Pap to the hospital.

## 2019-06-21 LAB — SARS CORONAVIRUS 2 (TAT 6-24 HRS): SARS Coronavirus 2: NEGATIVE

## 2019-06-24 ENCOUNTER — Ambulatory Visit: Payer: Medicare Other

## 2019-06-24 ENCOUNTER — Encounter
Admission: RE | Disposition: A | Discharge status: Home / Self Care | Payer: Self-pay | Source: Home / Self Care | Attending: Surgery

## 2019-06-24 ENCOUNTER — Ambulatory Visit
Admission: RE | Admit: 2019-06-24 | Discharge: 2019-06-24 | Disposition: A | Discharge status: Home / Self Care | Payer: Medicare Other | Attending: Surgery | Admitting: Surgery

## 2019-06-24 ENCOUNTER — Other Ambulatory Visit: Payer: Self-pay

## 2019-06-24 ENCOUNTER — Ambulatory Visit: Payer: Medicare Other | Admitting: Certified Registered Nurse Anesthetist

## 2019-06-24 DIAGNOSIS — M069 Rheumatoid arthritis, unspecified: Secondary | ICD-10-CM | POA: Insufficient documentation

## 2019-06-24 DIAGNOSIS — Z888 Allergy status to other drugs, medicaments and biological substances status: Secondary | ICD-10-CM | POA: Diagnosis not present

## 2019-06-24 DIAGNOSIS — E7800 Pure hypercholesterolemia, unspecified: Secondary | ICD-10-CM | POA: Diagnosis not present

## 2019-06-24 DIAGNOSIS — Z9049 Acquired absence of other specified parts of digestive tract: Secondary | ICD-10-CM | POA: Diagnosis not present

## 2019-06-24 DIAGNOSIS — M1990 Unspecified osteoarthritis, unspecified site: Secondary | ICD-10-CM | POA: Diagnosis not present

## 2019-06-24 DIAGNOSIS — S52031A Displaced fracture of olecranon process with intraarticular extension of right ulna, initial encounter for closed fracture: Secondary | ICD-10-CM | POA: Diagnosis not present

## 2019-06-24 DIAGNOSIS — G8918 Other acute postprocedural pain: Secondary | ICD-10-CM | POA: Diagnosis not present

## 2019-06-24 DIAGNOSIS — Z7982 Long term (current) use of aspirin: Secondary | ICD-10-CM | POA: Insufficient documentation

## 2019-06-24 DIAGNOSIS — E785 Hyperlipidemia, unspecified: Secondary | ICD-10-CM | POA: Diagnosis not present

## 2019-06-24 DIAGNOSIS — F319 Bipolar disorder, unspecified: Secondary | ICD-10-CM | POA: Insufficient documentation

## 2019-06-24 DIAGNOSIS — Z7984 Long term (current) use of oral hypoglycemic drugs: Secondary | ICD-10-CM | POA: Insufficient documentation

## 2019-06-24 DIAGNOSIS — M549 Dorsalgia, unspecified: Secondary | ICD-10-CM | POA: Insufficient documentation

## 2019-06-24 DIAGNOSIS — J45909 Unspecified asthma, uncomplicated: Secondary | ICD-10-CM | POA: Diagnosis not present

## 2019-06-24 DIAGNOSIS — Z79899 Other long term (current) drug therapy: Secondary | ICD-10-CM | POA: Insufficient documentation

## 2019-06-24 DIAGNOSIS — Z8349 Family history of other endocrine, nutritional and metabolic diseases: Secondary | ICD-10-CM | POA: Diagnosis not present

## 2019-06-24 DIAGNOSIS — Z833 Family history of diabetes mellitus: Secondary | ICD-10-CM | POA: Diagnosis not present

## 2019-06-24 DIAGNOSIS — M8580 Other specified disorders of bone density and structure, unspecified site: Secondary | ICD-10-CM | POA: Diagnosis not present

## 2019-06-24 DIAGNOSIS — K5790 Diverticulosis of intestine, part unspecified, without perforation or abscess without bleeding: Secondary | ICD-10-CM | POA: Diagnosis not present

## 2019-06-24 DIAGNOSIS — Z823 Family history of stroke: Secondary | ICD-10-CM | POA: Diagnosis not present

## 2019-06-24 DIAGNOSIS — Z885 Allergy status to narcotic agent status: Secondary | ICD-10-CM | POA: Diagnosis not present

## 2019-06-24 DIAGNOSIS — Z8249 Family history of ischemic heart disease and other diseases of the circulatory system: Secondary | ICD-10-CM | POA: Insufficient documentation

## 2019-06-24 DIAGNOSIS — H409 Unspecified glaucoma: Secondary | ICD-10-CM | POA: Insufficient documentation

## 2019-06-24 DIAGNOSIS — G8929 Other chronic pain: Secondary | ICD-10-CM | POA: Insufficient documentation

## 2019-06-24 DIAGNOSIS — E119 Type 2 diabetes mellitus without complications: Secondary | ICD-10-CM | POA: Diagnosis not present

## 2019-06-24 DIAGNOSIS — K219 Gastro-esophageal reflux disease without esophagitis: Secondary | ICD-10-CM | POA: Insufficient documentation

## 2019-06-24 DIAGNOSIS — I10 Essential (primary) hypertension: Secondary | ICD-10-CM | POA: Diagnosis not present

## 2019-06-24 DIAGNOSIS — Z8673 Personal history of transient ischemic attack (TIA), and cerebral infarction without residual deficits: Secondary | ICD-10-CM | POA: Diagnosis not present

## 2019-06-24 DIAGNOSIS — W19XXXA Unspecified fall, initial encounter: Secondary | ICD-10-CM | POA: Insufficient documentation

## 2019-06-24 DIAGNOSIS — E118 Type 2 diabetes mellitus with unspecified complications: Secondary | ICD-10-CM | POA: Insufficient documentation

## 2019-06-24 DIAGNOSIS — S52021A Displaced fracture of olecranon process without intraarticular extension of right ulna, initial encounter for closed fracture: Secondary | ICD-10-CM | POA: Diagnosis not present

## 2019-06-24 DIAGNOSIS — Z419 Encounter for procedure for purposes other than remedying health state, unspecified: Secondary | ICD-10-CM

## 2019-06-24 HISTORY — PX: ORIF ELBOW FRACTURE: SHX5031

## 2019-06-24 HISTORY — DX: Failed or difficult intubation, initial encounter: T88.4XXA

## 2019-06-24 LAB — GLUCOSE, CAPILLARY
Glucose-Capillary: 229 mg/dL — ABNORMAL HIGH (ref 70–99)
Glucose-Capillary: 191 mg/dL — ABNORMAL HIGH (ref 70–99)

## 2019-06-24 SURGERY — OPEN REDUCTION INTERNAL FIXATION (ORIF) ELBOW/OLECRANON FRACTURE
Anesthesia: General | Site: Elbow | Laterality: Right

## 2019-06-24 MED ORDER — FENTANYL CITRATE (PF) 100 MCG/2ML IJ SOLN
INTRAMUSCULAR | Status: AC
  Administered 2019-06-24: 08:00:00 50 ug via INTRAVENOUS
  Filled 2019-06-24: qty 2

## 2019-06-24 MED ORDER — BUPIVACAINE HCL (PF) 0.5 % IJ SOLN
INTRAMUSCULAR | Status: AC
  Filled 2019-06-24: qty 30

## 2019-06-24 MED ORDER — METOCLOPRAMIDE HCL 5 MG/ML IJ SOLN: 5.0000 mg | Freq: Three times a day (TID) | INTRAMUSCULAR | Status: DC | PRN

## 2019-06-24 MED ORDER — EPHEDRINE SULFATE 50 MG/ML IJ SOLN
INTRAMUSCULAR | Status: DC | PRN
  Administered 2019-06-24: 10 mg via INTRAVENOUS

## 2019-06-24 MED ORDER — SUGAMMADEX SODIUM 200 MG/2ML IV SOLN
INTRAVENOUS | Status: DC | PRN
  Administered 2019-06-24: 200 mg via INTRAVENOUS

## 2019-06-24 MED ORDER — FENTANYL CITRATE (PF) 100 MCG/2ML IJ SOLN
50.0000 ug | INTRAMUSCULAR | Status: DC | PRN
  Administered 2019-06-24: 08:00:00 50 ug via INTRAVENOUS

## 2019-06-24 MED ORDER — FENTANYL CITRATE (PF) 250 MCG/5ML IJ SOLN
INTRAMUSCULAR | Status: AC
  Filled 2019-06-24: qty 5

## 2019-06-24 MED ORDER — BUPIVACAINE HCL (PF) 0.25 % IJ SOLN
INTRAMUSCULAR | Status: AC
  Filled 2019-06-24: qty 30

## 2019-06-24 MED ORDER — PHENYLEPHRINE HCL-NACL 10-0.9 MG/250ML-% IV SOLN
INTRAVENOUS | Status: DC | PRN
  Administered 2019-06-24: 50 ug/min via INTRAVENOUS

## 2019-06-24 MED ORDER — DEXAMETHASONE SODIUM PHOSPHATE 10 MG/ML IJ SOLN
INTRAMUSCULAR | Status: AC
  Filled 2019-06-24: qty 1

## 2019-06-24 MED ORDER — LIDOCAINE HCL (CARDIAC) PF 100 MG/5ML IV SOSY
PREFILLED_SYRINGE | INTRAVENOUS | Status: DC | PRN
  Administered 2019-06-24: 100 mg via INTRAVENOUS

## 2019-06-24 MED ORDER — BUPIVACAINE HCL (PF) 0.5 % IJ SOLN
INTRAMUSCULAR | Status: AC
  Filled 2019-06-24: qty 10

## 2019-06-24 MED ORDER — CEFAZOLIN SODIUM-DEXTROSE 2-4 GM/100ML-% IV SOLN
INTRAVENOUS | Status: AC
  Filled 2019-06-24: qty 100

## 2019-06-24 MED ORDER — ONDANSETRON HCL 4 MG/2ML IJ SOLN
INTRAMUSCULAR | Status: AC
  Filled 2019-06-24: qty 2

## 2019-06-24 MED ORDER — FENTANYL CITRATE (PF) 100 MCG/2ML IJ SOLN: 25.0000 ug | INTRAMUSCULAR | Status: DC | PRN

## 2019-06-24 MED ORDER — BUPIVACAINE-EPINEPHRINE (PF) 0.5% -1:200000 IJ SOLN
INTRAMUSCULAR | Status: DC | PRN
  Administered 2019-06-24: 10 mL

## 2019-06-24 MED ORDER — ONDANSETRON HCL 4 MG/2ML IJ SOLN
INTRAMUSCULAR | Status: DC | PRN
  Administered 2019-06-24: 4 mg via INTRAVENOUS

## 2019-06-24 MED ORDER — CEFAZOLIN SODIUM-DEXTROSE 2-4 GM/100ML-% IV SOLN
2.0000 g | Freq: Once | INTRAVENOUS | Status: AC
  Administered 2019-06-24: 09:00:00 2 g via INTRAVENOUS

## 2019-06-24 MED ORDER — MIDAZOLAM HCL 2 MG/2ML IJ SOLN
2.0000 mg | Freq: Once | INTRAMUSCULAR | Status: AC
  Administered 2019-06-24: 08:00:00 2 mg via INTRAVENOUS

## 2019-06-24 MED ORDER — FENTANYL CITRATE (PF) 100 MCG/2ML IJ SOLN
INTRAMUSCULAR | Status: DC | PRN
  Administered 2019-06-24 (×2): 50 ug via INTRAVENOUS

## 2019-06-24 MED ORDER — BUPIVACAINE LIPOSOME 1.3 % IJ SUSP
INTRAMUSCULAR | Status: DC | PRN
  Administered 2019-06-24: 13 mL via PERINEURAL
  Administered 2019-06-24: 7 mL via PERINEURAL

## 2019-06-24 MED ORDER — METOCLOPRAMIDE HCL 10 MG PO TABS: 5.0000 mg | ORAL_TABLET | Freq: Three times a day (TID) | ORAL | Status: DC | PRN

## 2019-06-24 MED ORDER — ROCURONIUM BROMIDE 50 MG/5ML IV SOLN
INTRAVENOUS | Status: AC
  Filled 2019-06-24: qty 2

## 2019-06-24 MED ORDER — BUPIVACAINE HCL (PF) 0.5 % IJ SOLN
INTRAMUSCULAR | Status: DC | PRN
  Administered 2019-06-24: 3 mL via PERINEURAL
  Administered 2019-06-24: 7 mL via PERINEURAL

## 2019-06-24 MED ORDER — PROPOFOL 10 MG/ML IV BOLUS
INTRAVENOUS | Status: AC
  Filled 2019-06-24: qty 20

## 2019-06-24 MED ORDER — MIDAZOLAM HCL 2 MG/2ML IJ SOLN
INTRAMUSCULAR | Status: AC
  Administered 2019-06-24: 08:00:00 2 mg via INTRAVENOUS
  Filled 2019-06-24: qty 2

## 2019-06-24 MED ORDER — SODIUM CHLORIDE 0.9 % IV SOLN
INTRAVENOUS | Status: DC
  Administered 2019-06-24: 08:00:00 via INTRAVENOUS

## 2019-06-24 MED ORDER — ROCURONIUM BROMIDE 100 MG/10ML IV SOLN
INTRAVENOUS | Status: DC | PRN
  Administered 2019-06-24: 50 mg via INTRAVENOUS

## 2019-06-24 MED ORDER — PHENYLEPHRINE HCL (PRESSORS) 10 MG/ML IV SOLN
INTRAVENOUS | Status: DC | PRN
  Administered 2019-06-24: 150 ug via INTRAVENOUS
  Administered 2019-06-24: 100 ug via INTRAVENOUS
  Administered 2019-06-24: 50 ug via INTRAVENOUS

## 2019-06-24 MED ORDER — ONDANSETRON HCL 4 MG PO TABS: 4.0000 mg | ORAL_TABLET | Freq: Four times a day (QID) | ORAL | Status: DC | PRN

## 2019-06-24 MED ORDER — DEXAMETHASONE SODIUM PHOSPHATE 4 MG/ML IJ SOLN
INTRAMUSCULAR | Status: DC | PRN
  Administered 2019-06-24: 5 mg via INTRAVENOUS

## 2019-06-24 MED ORDER — BUPIVACAINE LIPOSOME 1.3 % IJ SUSP
INTRAMUSCULAR | Status: AC
  Filled 2019-06-24: qty 20

## 2019-06-24 MED ORDER — LIDOCAINE HCL (PF) 1 % IJ SOLN
INTRAMUSCULAR | Status: AC
  Filled 2019-06-24: qty 5

## 2019-06-24 MED ORDER — HYDROCODONE-ACETAMINOPHEN 5-325 MG PO TABS: 1.0000 | ORAL_TABLET | ORAL | Status: DC | PRN

## 2019-06-24 MED ORDER — SEVOFLURANE IN SOLN
RESPIRATORY_TRACT | Status: AC
  Filled 2019-06-24: qty 250

## 2019-06-24 MED ORDER — NEOMYCIN-POLYMYXIN B GU 40-200000 IR SOLN
Status: AC
  Filled 2019-06-24: qty 2

## 2019-06-24 MED ORDER — BUPIVACAINE-EPINEPHRINE (PF) 0.5% -1:200000 IJ SOLN
INTRAMUSCULAR | Status: AC
  Filled 2019-06-24: qty 30

## 2019-06-24 MED ORDER — LIDOCAINE HCL (PF) 2 % IJ SOLN
INTRAMUSCULAR | Status: AC
  Filled 2019-06-24: qty 10

## 2019-06-24 MED ORDER — HYDROCODONE-ACETAMINOPHEN 5-325 MG PO TABS: 1.0000 | ORAL_TABLET | Freq: Four times a day (QID) | ORAL | 0 refills | Status: DC | PRN

## 2019-06-24 MED ORDER — SUGAMMADEX SODIUM 200 MG/2ML IV SOLN
INTRAVENOUS | Status: AC
  Filled 2019-06-24: qty 2

## 2019-06-24 MED ORDER — PROPOFOL 10 MG/ML IV BOLUS
INTRAVENOUS | Status: DC | PRN
  Administered 2019-06-24: 130 mg via INTRAVENOUS

## 2019-06-24 MED ORDER — ONDANSETRON HCL 4 MG/2ML IJ SOLN: 4.0000 mg | Freq: Four times a day (QID) | INTRAMUSCULAR | Status: DC | PRN

## 2019-06-24 MED ORDER — SUCCINYLCHOLINE CHLORIDE 20 MG/ML IJ SOLN
INTRAMUSCULAR | Status: DC | PRN
  Administered 2019-06-24: 100 mg via INTRAVENOUS

## 2019-06-24 SURGICAL SUPPLY — 58 items
APL PRP STRL LF DISP 70% ISPRP (MISCELLANEOUS) ×1
BIT DRILL 2.5X2.75 QC CALB (BIT) ×2 IMPLANT
BNDG CMPR STD VLCR NS LF 5.8X4 (GAUZE/BANDAGES/DRESSINGS) ×2
BNDG COHESIVE 4X5 TAN STRL (GAUZE/BANDAGES/DRESSINGS) ×2 IMPLANT
BNDG ELASTIC 4X5.8 VLCR NS LF (GAUZE/BANDAGES/DRESSINGS) ×6 IMPLANT
BNDG ESMARK 4X12 TAN STRL LF (GAUZE/BANDAGES/DRESSINGS) ×2 IMPLANT
CANISTER SUCT 1200ML W/VALVE (MISCELLANEOUS) ×3 IMPLANT
CHLORAPREP W/TINT 26 (MISCELLANEOUS) ×3 IMPLANT
CLOSURE WOUND 1/2 X4 (GAUZE/BANDAGES/DRESSINGS) ×1
COVER WAND RF STERILE (DRAPES) ×3 IMPLANT
CUFF TOURN 18 STER (MISCELLANEOUS) ×3 IMPLANT
CUFF TOURN 24 STER (MISCELLANEOUS) ×3 IMPLANT
DRAPE 3/4 80X56 (DRAPES) ×3 IMPLANT
DRAPE C-ARM XRAY 36X54 (DRAPES) ×3 IMPLANT
DRAPE POUCH INSTRU U-SHP 10X18 (DRAPES) ×2 IMPLANT
DRAPE SURG 17X11 SM STRL (DRAPES) ×2 IMPLANT
ELECT CAUTERY BLADE 6.4 (BLADE) ×3 IMPLANT
ELECT REM PT RETURN 9FT ADLT (ELECTROSURGICAL) ×3
ELECTRODE REM PT RTRN 9FT ADLT (ELECTROSURGICAL) ×1 IMPLANT
GAUZE SPONGE 4X4 12PLY STRL (GAUZE/BANDAGES/DRESSINGS) ×3 IMPLANT
GAUZE XEROFORM 1X8 LF (GAUZE/BANDAGES/DRESSINGS) ×3 IMPLANT
GLOVE SURG SYN 9.0  PF PI (GLOVE) ×2
GLOVE SURG SYN 9.0 PF PI (GLOVE) ×1 IMPLANT
GOWN SRG 2XL LVL 4 RGLN SLV (GOWNS) ×1 IMPLANT
GOWN STRL NON-REIN 2XL LVL4 (GOWNS)
GOWN STRL REUS W/ TWL LRG LVL3 (GOWN DISPOSABLE) ×1 IMPLANT
GOWN STRL REUS W/TWL LRG LVL3 (GOWN DISPOSABLE) ×3
K wire ×2 IMPLANT
K-WIRE FIXATION 2.0X6 (WIRE) ×6
KIT TURNOVER KIT A (KITS) ×3 IMPLANT
KWIRE FIXATION 2.0X6 (WIRE) IMPLANT
NDL FILTER BLUNT 18X1 1/2 (NEEDLE) ×1 IMPLANT
NEEDLE FILTER BLUNT 18X 1/2SAF (NEEDLE) ×2
NEEDLE FILTER BLUNT 18X1 1/2 (NEEDLE) ×1 IMPLANT
NS IRRIG 500ML POUR BTL (IV SOLUTION) ×5 IMPLANT
PACK EXTREMITY ARMC (MISCELLANEOUS) ×3 IMPLANT
PAD ABD DERMACEA PRESS 5X9 (GAUZE/BANDAGES/DRESSINGS) ×6 IMPLANT
PAD CAST CTTN 4X4 STRL (SOFTGOODS) ×3 IMPLANT
PAD PREP 24X41 OB/GYN DISP (PERSONAL CARE ITEMS) ×3 IMPLANT
PADDING CAST COTTON 4X4 STRL (SOFTGOODS) ×9
SCALPEL PROTECTED #15 DISP (BLADE) ×6 IMPLANT
SLEEVE PROTECTION STRL DISP (MISCELLANEOUS) ×2 IMPLANT
SLING ARM LRG DEEP (SOFTGOODS) ×2 IMPLANT
SPLINT CAST 1 STEP 4X30 (MISCELLANEOUS) ×2 IMPLANT
SPLINT CAST 1 STEP 5X30 WHT (MISCELLANEOUS) ×3 IMPLANT
SPONGE LAP 18X18 RF (DISPOSABLE) ×3 IMPLANT
STAPLER SKIN PROX 35W (STAPLE) ×3 IMPLANT
STOCKINETTE IMPERVIOUS 9X36 MD (GAUZE/BANDAGES/DRESSINGS) ×2 IMPLANT
STRIP CLOSURE SKIN 1/2X4 (GAUZE/BANDAGES/DRESSINGS) ×2 IMPLANT
SUT ETHILON 3-0 FS-10 30 BLK (SUTURE)
SUT FIBERWIRE #2 38 BLUE 1/2 (SUTURE) ×6
SUT FIBERWIRE #5 38 CONV BLUE (SUTURE) ×3
SUT VIC AB 0 CT2 27 (SUTURE) ×1 IMPLANT
SUT VIC AB 2-0 CT2 27 (SUTURE) ×1 IMPLANT
SUTURE EHLN 3-0 FS-10 30 BLK (SUTURE) ×1 IMPLANT
SUTURE FIBERWR #2 38 BLUE 1/2 (SUTURE) IMPLANT
SUTURE FIBERWR #5 38 CONV BLUE (SUTURE) IMPLANT
SYR 5ML LL (SYRINGE) ×3 IMPLANT

## 2019-06-24 NOTE — Op Note (Signed)
06/24/2019  11:21 AM  Patient:   Diana Roberson  Pre-Op Diagnosis:   Closed displaced olecranon fracture, right elbow.  Post-Op Diagnosis:   Same  Procedure:   Open reduction and internal fixation of displaced right olecranon fracture.  Surgeon:   Pascal Lux, MD  Assistant:   None  Anesthesia:   GET with interscalene block using Exparel placed preoperatively by anesthesiologist  Findings:   As above.  Complications:   None  Fluids:   500 cc crystalloid  EBL:   10 cc  UOP:   None  TT:   80 minutes at 250 mmHg  Drains:   None  Closure:   Staples  Implants:   Short [employer] precontoured olecranon plate  Brief Clinical Note:   The patient is a 73 year old female who sustained above-noted injury 10 days ago when she fell onto her flexed elbow.  X-rays in the emergency room demonstrated the above-noted injury.  She had significant swelling so it was elected to allow this to subside before proceeding with surgical intervention.  The patient presents at this time for definitive management of this injury.  Procedure:   The patient underwent the placement of an interscalene block using Exparel in the preoperative holding area by the anesthesiologist before being brought into the operating room and laid in the supine position.  After adequate general endotracheal intubation and anesthesia were obtained, the patient was rolled into the left lateral decubitus position and secured using a short beanbag.  Care was taken to be sure that all bony prominences were padded and that an axillary roll was utilized.  The right upper extremity was prepped with ChloraPrep solution before being draped sterilely.  Preoperative antibiotics were administered.  A timeout was performed to verify the appropriate surgical site before the limb was exsanguinated with an Esmarch and the tourniquet inflated to 250 mmHg.  An approximately 10 to 12 cm curvilinear incision was made over the posterior  aspect of the elbow, curving around the posterior aspect of the olecranon.  The incision was carried down through the subcutaneous tissues to expose the fracture site.  The abundant fracture hematoma was debrided using pick-ups, curettes, rongeurs, and irrigation.  Once the fracture was debrided thoroughly, the fracture was reduced and temporarily secured using bone tenaculums.  While reducing the fracture, it was noted that the proximal fragment was actually split in the sagittal plane, rendering plate fixation less likely to succeed.  Therefore, it was elected to proceed with tension band wiring of the fracture.  A drill hole was placed through the posterior portion of the proximal ulna approximately 5 cm distal to the fracture.  A #2 and a #5 FiberWire suture were passed through this hole in preparation for fixation with the tension band technique.  After verifying the adequacy of reduction fluoroscopically in AP and lateral projections, two 0.062 K wires were drilled from proximal to distal and from posterior to anterior just beneath the articular surface to exit the anterior aspect of the proximal ulna.  The adequacy of hardware position was verified and found to be satisfactory.  Each of the sutures were passed around the two wires and through the triceps tendon in a figure-of-eight fashion, then tied securely with the elbow at approximately 45 degrees of flexion.  The elbow was gently flexed to doing it to near 90 degrees and the construct held well.  Again the adequacy of fracture reduction and hardware position was verified fluoroscopically in the AP and lateral  projections and found to be excellent.   The wound was copiously irrigated with bacitracin saline solution with bulb irrigation before the deeper fascial layers were reapproximated using 2-0 Vicryl interrupted sutures.  The subcutaneous tissues were closed using 2-0 and 3-0 Vicryl interrupted sutures before the skin was closed with skin staples.   A total of 10 cc of 0.5% Sensorcaine with epinephrine was injected in and around the incision to help with postoperative analgesia.  A sterile bulky dressing was applied to the wound.  The patient was then placed into a posterior splint maintaining the elbow at approximately 80 degrees of flexion.  The patient was rolled back into the supine position on her hospital bed before being awakened, extubated, and returned to the recovery room in satisfactory condition after tolerating the procedure well.

## 2019-06-24 NOTE — Anesthesia Postprocedure Evaluation (Signed)
Anesthesia Post Note  Patient: Diana Roberson  Procedure(s) Performed: OPEN REDUCTION INTERNAL FIXATION (ORIF) ELBOW/OLECRANON FRACTURE (Right Elbow)  Patient location during evaluation: PACU Anesthesia Type: General Level of consciousness: awake and alert Pain management: pain level controlled Vital Signs Assessment: post-procedure vital signs reviewed and stable Respiratory status: spontaneous breathing, nonlabored ventilation, respiratory function stable and patient connected to nasal cannula oxygen Cardiovascular status: blood pressure returned to baseline and stable Postop Assessment: no apparent nausea or vomiting Anesthetic complications: no     Last Vitals:  Vitals:   06/24/19 1141 06/24/19 1150  BP: (!) 156/91 (!) 157/99  Pulse: 88 87  Resp: 17 16  Temp: (!) 36.3 C (!) 36.2 C  SpO2: 98% 98%    Last Pain:  Vitals:   06/24/19 1150  TempSrc: Temporal  PainSc: 0-No pain                 Precious Haws [place]

## 2019-06-24 NOTE — Transfer of Care (Signed)
Immediate Anesthesia Transfer of Care Note  Patient: Diana Roberson  Procedure(s) Performed: OPEN REDUCTION INTERNAL FIXATION (ORIF) ELBOW/OLECRANON FRACTURE (Right Elbow)  Patient Location: PACU  Anesthesia Type:General  Level of Consciousness: awake, alert  and oriented  Airway & Oxygen Therapy: Patient Spontanous Breathing and Patient connected to nasal cannula oxygen  Post-op Assessment: Report given to RN and Post -op Vital signs reviewed and stable  Post vital signs: Reviewed and stable  Last Vitals:  Vitals Value Taken Time  BP 151/99 06/24/19 1112  Temp    Pulse 88 06/24/19 1113  Resp 15 06/24/19 1113  SpO2 100 % 06/24/19 1113  Vitals shown include unvalidated device data.  Last Pain:  Vitals:   06/24/19 0749  TempSrc: Tympanic  PainSc: 0-No pain         Complications: No apparent anesthesia complications

## 2019-06-24 NOTE — Anesthesia Post-op Follow-up Note (Signed)
Anesthesia QCDR form completed.        

## 2019-06-24 NOTE — Anesthesia Procedure Notes (Signed)
Anesthesia Regional Block: Interscalene brachial plexus block   Pre-Anesthetic Checklist: ,, timeout performed, Correct Patient, Correct Site, Correct Laterality, Correct Procedure, Correct Position, site marked, Risks and benefits discussed,  Surgical consent,  Pre-op evaluation,  At surgeon's request and post-op pain management  Laterality: Upper and Right  Prep: chloraprep       Needles:  Injection technique: Single-shot  Needle Type: Stimiplex     Needle Length: 5cm  Needle Gauge: 22     Additional Needles:   Procedures:,,,, ultrasound used (permanent image in chart),,,,  Narrative:  Start time: 06/24/2019 8:27 AM End time: 06/24/2019 8:30 AM Injection made incrementally with aspirations every 5 mL.  Performed by: Personally  Anesthesiologist: Shereta Crothers, Precious Haws, MD  Additional Notes: Patient consented for risk and benefits of nerve block including but not limited to nerve damage, failed block, bleeding and infection.  Patient voiced understanding.  Functioning IV was confirmed and monitors were applied.  A 99mm 22ga Stimuplex needle was used. Sterile prep,hand hygiene and sterile gloves were used.  Minimal sedation used for procedure.  No paresthesia endorsed by patient during the procedure.  Negative aspiration and negative test dose prior to incremental administration of local anesthetic. The patient tolerated the procedure well with no immediate complications.

## 2019-06-24 NOTE — Anesthesia Procedure Notes (Signed)
Procedure Name: Intubation Date/Time: 06/24/2019 8:55 AM Performed by: Bernardo Heater, CRNA Pre-anesthesia Checklist: Patient identified, Patient being monitored, Timeout performed, Emergency Drugs available and Suction available Patient Re-evaluated:Patient Re-evaluated prior to induction Oxygen Delivery Method: Circle system utilized Preoxygenation: Pre-oxygenation with 100% oxygen Induction Type: IV induction Ventilation: Mask ventilation without difficulty Laryngoscope Size: Mac and 3 Grade View: Grade I Tube type: Oral Tube size: 7.0 mm Number of attempts: 2 Airway Equipment and Method: Bougie stylet Placement Confirmation: ETT inserted through vocal cords under direct vision,  positive ETCO2 and breath sounds checked- equal and bilateral Secured at: 21 cm Tube secured with: Tape Dental Injury: Teeth and Oropharynx as per pre-operative assessment  Difficulty Due To: Difficulty was anticipated Future Recommendations: Recommend- induction with short-acting agent, and alternative techniques readily available

## 2019-06-24 NOTE — Discharge Instructions (Addendum)
Orthopedic discharge instructions: Keep splint dry and intact. Keep hand elevated above heart level. Apply ice to affected area frequently. Take ibuprofen 600 mg TID with meals for 7-10 days, then as necessary. Take pain medication as prescribed or ES Tylenol when needed.  Return for follow-up in 10-14 days or as scheduled.  AMBULATORY SURGERY  DISCHARGE INSTRUCTIONS   1) The drugs that you were given will stay in your system until tomorrow so for the next 24 hours you should not:  A) Drive an automobile B) Make any legal decisions C) Drink any alcoholic beverage   2) You may resume regular meals tomorrow.  Today it is better to start with liquids and gradually work up to solid foods.  You may eat anything you prefer, but it is better to start with liquids, then soup and crackers, and gradually work up to solid foods.   3) Please notify your doctor immediately if you have any unusual bleeding, trouble breathing, redness and pain at the surgery site, drainage, fever, or pain not relieved by medication.    4) Additional Instructions:        Please contact your physician with any problems or Same Day Surgery at [phone], Monday through Friday 6 am to 4 pm, or [place] [facility] [place] Main number at [phone]. 

## 2019-06-24 NOTE — H&P (Signed)
Paper H&P to be scanned into permanent record. H&P reviewed and patient re-examined. No changes. 

## 2019-06-24 NOTE — Anesthesia Preprocedure Evaluation (Signed)
Anesthesia Evaluation  Patient identified by MRN, date of birth, ID band Patient awake    Reviewed: Allergy & Precautions, H&P , NPO status , Patient's Chart, lab work & pertinent test results  History of Anesthesia Complications Negative for: history of anesthetic complications  Airway Mallampati: III  TM Distance: <3 FB Neck ROM: limited    Dental  (+) Chipped, Poor Dentition   Pulmonary neg pulmonary ROS, neg shortness of breath,           Cardiovascular Exercise Tolerance: Good hypertension, (-) angina(-) Past MI and (-) DOE      Neuro/Psych PSYCHIATRIC DISORDERS negative neurological ROS     GI/Hepatic Neg liver ROS, GERD  Medicated and Controlled,  Endo/Other  diabetes, Type 2  Renal/GU      Musculoskeletal  (+) Arthritis ,   Abdominal   Peds  Hematology negative hematology ROS (+)   Anesthesia Other Findings Past Medical History: No date: Allergy No date: Bipolar 1 disorder (HCC) No date: Chronic back pain No date: Diabetes mellitus No date: Diverticulitis No date: GERD (gastroesophageal reflux disease) No date: Glaucoma No date: Hyperlipidemia No date: Hypertension No date: Mental health disorder     Comment:  admitted x3 No date: Osteopenia No date: Rheumatoid arthritis ([place])  Past Surgical History: No date: APPENDECTOMY 1988: BACK SURGERY No date: BREAST CYST ASPIRATION     Comment:  neg No date: CHOLECYSTECTOMY No date: COLONOSCOPY 11/27/2017: COLONOSCOPY WITH PROPOFOL; N/A     Comment:  Procedure: COLONOSCOPY WITH PROPOFOL;  Surgeon:               Lollie Sails, MD;  Location: [facility] ENDOSCOPY;                Service: Endoscopy;  Laterality: N/A; No date: HAND SURGERY  BMI    Body Mass Index: 24.03 kg/m      Reproductive/Obstetrics negative OB ROS                             Anesthesia Physical Anesthesia Plan  ASA: III  Anesthesia Plan: General  ETT   Post-op Pain Management: GA combined w/ Regional for post-op pain   Induction: Intravenous  PONV Risk Score and Plan: Ondansetron, Dexamethasone, Midazolam and Treatment may vary due to age or medical condition  Airway Management Planned: Oral ETT  Additional Equipment:   Intra-op Plan:   Post-operative Plan: Extubation in OR  Informed Consent: I have reviewed the patients History and Physical, chart, labs and discussed the procedure including the risks, benefits and alternatives for the proposed anesthesia with the patient or authorized representative who has indicated his/her understanding and acceptance.     Dental Advisory Given  Plan Discussed with: Anesthesiologist, CRNA and Surgeon  Anesthesia Plan Comments: (Patient consented for risks of anesthesia including but not limited to:  - adverse reactions to medications - damage to teeth, lips or other oral mucosa - sore throat or hoarseness - Damage to heart, brain, lungs or loss of life  Patient voiced understanding.)        Anesthesia Quick Evaluation

## 2019-06-25 ENCOUNTER — Encounter: Payer: Self-pay | Admitting: Surgery

## 2019-06-30 ENCOUNTER — Other Ambulatory Visit: Payer: Self-pay | Admitting: Internal Medicine

## 2019-07-04 DIAGNOSIS — S52021D Displaced fracture of olecranon process without intraarticular extension of right ulna, subsequent encounter for closed fracture with routine healing: Secondary | ICD-10-CM | POA: Diagnosis not present

## 2019-07-04 DIAGNOSIS — S52031D Displaced fracture of olecranon process with intraarticular extension of right ulna, subsequent encounter for closed fracture with routine healing: Secondary | ICD-10-CM | POA: Diagnosis not present

## 2019-07-10 ENCOUNTER — Other Ambulatory Visit: Payer: Self-pay | Admitting: Internal Medicine

## 2019-07-17 ENCOUNTER — Encounter: Payer: Self-pay | Admitting: Internal Medicine

## 2019-07-17 ENCOUNTER — Ambulatory Visit (INDEPENDENT_AMBULATORY_CARE_PROVIDER_SITE_OTHER): Payer: Medicare Other | Admitting: Internal Medicine

## 2019-07-17 ENCOUNTER — Other Ambulatory Visit: Payer: Self-pay

## 2019-07-17 VITALS — BP 124/80 | HR 80 | Temp 98.6°F | Ht 64.0 in | Wt 136.0 lb

## 2019-07-17 DIAGNOSIS — K219 Gastro-esophageal reflux disease without esophagitis: Secondary | ICD-10-CM | POA: Diagnosis not present

## 2019-07-17 DIAGNOSIS — M059 Rheumatoid arthritis with rheumatoid factor, unspecified: Secondary | ICD-10-CM | POA: Diagnosis not present

## 2019-07-17 DIAGNOSIS — F3174 Bipolar disorder, in full remission, most recent episode manic: Secondary | ICD-10-CM | POA: Diagnosis not present

## 2019-07-17 DIAGNOSIS — E119 Type 2 diabetes mellitus without complications: Secondary | ICD-10-CM | POA: Diagnosis not present

## 2019-07-17 DIAGNOSIS — E785 Hyperlipidemia, unspecified: Secondary | ICD-10-CM

## 2019-07-17 DIAGNOSIS — E1165 Type 2 diabetes mellitus with hyperglycemia: Secondary | ICD-10-CM

## 2019-07-17 DIAGNOSIS — Z0000 Encounter for general adult medical examination without abnormal findings: Secondary | ICD-10-CM

## 2019-07-17 DIAGNOSIS — Z7189 Other specified counseling: Secondary | ICD-10-CM

## 2019-07-17 LAB — POCT GLYCOSYLATED HEMOGLOBIN (HGB A1C): Hemoglobin A1C: 8.4 % — AB (ref 4.0–5.6)

## 2019-07-17 NOTE — Progress Notes (Signed)
Hearing Screening   Method: Audiometry   125Hz 250Hz 500Hz 1000Hz 2000Hz 3000Hz 4000Hz 6000Hz 8000Hz  Right ear:   20 20 20  20    Left ear:   20 20 20  20    Vision Screening Comments: October 2020   

## 2019-07-17 NOTE — Assessment & Plan Note (Signed)
I have personally reviewed the Medicare Annual Wellness questionnaire and have noted 1. The patient's medical and social history 2. Their use of alcohol, tobacco or illicit drugs 3. Their current medications and supplements 4. The patient's functional ability including ADL's, fall risks, home safety risks and hearing or visual             impairment. 5. Diet and physical activities 6. Evidence for depression or mood disorders  The patients weight, height, BMI and visual acuity have been recorded in the chart I have made referrals, counseling and provided education to the patient based review of the above and I have provided the pt with a written personalized care plan for preventive services.  I have provided you with a copy of your personalized plan for preventive services. Please take the time to review along with your updated medication list.  Prefers no flu shot Normal colon last year--no more Last mammogram at 74--next June Discussed exercise Consider shingrix

## 2019-07-17 NOTE — Assessment & Plan Note (Signed)
Controlled well with her dual therapy Wean not indicated

## 2019-07-17 NOTE — Assessment & Plan Note (Signed)
No problems with statin 

## 2019-07-17 NOTE — Assessment & Plan Note (Signed)
Does well with just the pepcid

## 2019-07-17 NOTE — Progress Notes (Signed)
Subjective:    Patient ID: Diana BowensManuela R Roberson, female    DOB: 31-May-1946, 73 y.o.   MRN: [id number]  HPI Here for Medicare wellness visit and follow up of chronic health conditions With daughter Diana Roberson Reviewed form and advanced directives Reviewed other doctors No alcohol or tobacco Not really exercising---had started but then broke elbow Vision and hearing are fine Did fall with injury Chronic bipolar but no depression or anhedonia Independent with house work when not injured No memory problems  Larey SeatFell 10/13, ankle twisted and went down Fracture of right elbow--had surgery Going back to ortho tomorrow   Continues to see Dr Renard MatterBock for the RA MTX seems to be controlling things  Checks sugars ~ weekly Usually run 130-140 fasting No hypoglycemic reactions No foot sores or numbness  Bipolar continues to be controlled Using both meds at same dose No mania or depression Sleeps fine  No recent problems with back pain  Current Outpatient Medications on File Prior to Visit  Medication Sig Dispense Refill  . aspirin 325 MG tablet Take 325 mg by mouth every other day.     Marland Kitchen. atorvastatin (LIPITOR) 20 MG tablet TAKE 1 TABLET BY MOUTH  DAILY (Patient taking differently: Take 20 mg by mouth daily. ) 90 tablet 3  . brimonidine (ALPHAGAN) 0.2 % ophthalmic solution Place 1 drop into both eyes 2 (two) times daily.    . calcium gluconate 500 MG tablet Take 1 tablet by mouth 2 (two) times daily.     . cholecalciferol (VITAMIN D) 1000 UNITS tablet Take 1,000 Units by mouth daily.    . cyclobenzaprine (FLEXERIL) 10 MG tablet TAKE 1 TABLET BY MOUTH 3  TIMES DAILY AS NEEDED FOR  MUSCLE SPASM(S) (Patient taking differently: Take 10 mg by mouth 3 (three) times daily as needed for muscle spasms. ) 180 tablet 0  . dorzolamide-timolol (COSOPT) 22.3-6.8 MG/ML ophthalmic solution Place 1 drop into both eyes 2 (two) times daily.    . famotidine (PEPCID) 20 MG tablet TAKE 1 TABLET BY MOUTH TWO  TIMES DAILY  (Patient taking differently: Take 20 mg by mouth daily. In am) 180 tablet 3  . folic acid (FOLVITE) 1 MG tablet Take 1 tablet (1 mg total) by mouth daily. 90 tablet 3  . glucose blood (ONETOUCH VERIO) test strip Use to check blood sugar once a day. Dx Code E11.9 100 each 3  . lisinopril (ZESTRIL) 2.5 MG tablet TAKE 1 TABLET BY MOUTH  DAILY (Patient taking differently: Take 2.5 mg by mouth daily. ) 90 tablet 3  . metFORMIN (GLUCOPHAGE) 500 MG tablet TAKE 1 TABLET BY MOUTH  TWICE DAILY (Patient taking differently: Take 500 mg by mouth 2 (two) times daily with a meal. ) 180 tablet 3  . methotrexate (RHEUMATREX) 2.5 MG tablet TAKE 6 TABLETS BY MOUTH  ONCE A WEEK.  CAUTION:CHEMOTHERAPY.  PROTECT FROM LIGHT. (Patient taking differently: Take 15 mg by mouth once a week. ) 78 tablet 3  . OneTouch Delica Lancets 33G MISC 1 each by Does not apply route daily. Dx Code E11.9 100 each 3  . QUEtiapine (SEROQUEL) 50 MG tablet TAKE 1 TABLET BY MOUTH AT  BEDTIME 90 tablet 3  . vitamin B-12 (CYANOCOBALAMIN) 1000 MCG tablet Take 1,000 mcg by mouth daily.      . vitamin E 400 UNIT capsule Take 400 Units by mouth daily.      . ziprasidone (GEODON) 80 MG capsule TAKE 1 TO 2 CAPSULES BY  MOUTH DAILY 180 capsule  3   No current facility-administered medications on file prior to visit.     Allergies  Allergen Reactions  . Codeine Sulfate Other (See Comments)    Indigestion and epigastric pain  . Sertraline Hcl Other (See Comments)    REACTION: Worse, ? irritable    Past Medical History:  Diagnosis Date  . Allergy   . Bipolar 1 disorder (HCC)   . Chronic back pain   . Diabetes mellitus   . Difficult intubation   . Diverticulitis   . GERD (gastroesophageal reflux disease)   . Glaucoma   . Hyperlipidemia   . Hypertension   . Mental health disorder    admitted x3  . Osteopenia   . Rheumatoid arthritis [facility])     Past Surgical History:  Procedure Laterality Date  . APPENDECTOMY    . BACK SURGERY  1988   . BREAST CYST ASPIRATION     neg  . CHOLECYSTECTOMY    . COLONOSCOPY    . COLONOSCOPY WITH PROPOFOL N/A 11/27/2017   Procedure: COLONOSCOPY WITH PROPOFOL;  Surgeon: Christena Deem, MD;  Location: [facility] ENDOSCOPY;  Service: Endoscopy;  Laterality: N/A;  . HAND SURGERY    . ORIF ELBOW FRACTURE Right 06/24/2019   Procedure: OPEN REDUCTION INTERNAL FIXATION (ORIF) ELBOW/OLECRANON FRACTURE;  Surgeon: Christena Flake, MD;  Location: [facility] ORS;  Service: Orthopedics;  Laterality: Right;    Family History  Problem Relation Age of Onset  . Diabetes Mother   . Hypertension Mother   . Stroke Sister   . Hashimoto's thyroiditis Daughter   . Stroke Father   . Stroke Sister   . Cancer Neg Hx   . Breast cancer Neg Hx     Social History   Socioeconomic History  . Marital status: Married    Spouse name: Not on file  . Number of children: 2  . Years of education: Not on file  . Highest education level: Not on file  Occupational History  . Occupation: formerly Designer, jewellery for [employer]  . Financial resource strain: Not on file  . Food insecurity    Worry: Not on file    Inability: Not on file  . Transportation needs    Medical: Not on file    Non-medical: Not on file  Tobacco Use  . Smoking status: Never Smoker  . Smokeless tobacco: Never Used  Substance and Sexual Activity  . Alcohol use: No  . Drug use: No  . Sexual activity: Not on file  Lifestyle  . Physical activity    Days per week: Not on file    Minutes per session: Not on file  . Stress: Not on file  Relationships  . Social Musician on phone: Not on file    Gets together: Not on file    Attends religious service: Not on file    Active member of club or organization: Not on file    Attends meetings of clubs or organizations: Not on file    Relationship status: Not on file  . Intimate partner violence    Fear of current or ex partner: Not on file    Emotionally abused: Not on  file    Physically abused: Not on file    Forced sexual activity: Not on file  Other Topics Concern  . Not on file  Social History Narrative   Has living will   Husband is health care POA. Alternate is daughter Diana Jester  Would accept resuscitation attempts   Would not want feeding tube if cognitively unaware   Review of Systems Appetite is fine Weight is stable Sleeps well Wears seat belt Teeth are fine No rash or suspicious skin lesions No heartburn or dysphagia--continues on the pepcid (uses prn) Bowels are fine. No blood    Objective:   Physical Exam  Constitutional: She is oriented to person, place, and time. She appears well-developed. No distress.  HENT:  Mouth/Throat: Oropharynx is clear and moist. No oropharyngeal exudate.  Neck: No thyromegaly present.  Cardiovascular: Normal rate, regular rhythm, normal heart sounds and intact distal pulses. Exam reveals no gallop.  No murmur heard. Respiratory: Breath sounds normal. No respiratory distress. She has no wheezes. She has no rales.  GI: Soft. There is no abdominal tenderness.  Musculoskeletal:        General: No tenderness or edema.     Comments: Right arm immobilized in sling  Lymphadenopathy:    She has no cervical adenopathy.  Neurological: She is alert and oriented to person, place, and time.  President-- "Trunp, Obama, Bush" [id number] D-l-[id number] Recall 3/3  Normal sensation in feet  Skin:  No foot lesions  Psychiatric: She has a normal mood and affect. Her behavior is normal.           Assessment & Plan:

## 2019-07-17 NOTE — Assessment & Plan Note (Signed)
See social history 

## 2019-07-17 NOTE — Assessment & Plan Note (Addendum)
Seems to have good control Will check A1c   Lab Results  Component Value Date   HGBA1C 8.4 (A) 07/17/2019   Discussed improving lifestyle No new meds Recheck 6 months

## 2019-07-17 NOTE — Assessment & Plan Note (Signed)
Controlled with MTX Sees Dr Meda Coffee

## 2019-07-18 DIAGNOSIS — S52031D Displaced fracture of olecranon process with intraarticular extension of right ulna, subsequent encounter for closed fracture with routine healing: Secondary | ICD-10-CM | POA: Diagnosis not present

## 2019-08-11 DIAGNOSIS — S52031D Displaced fracture of olecranon process with intraarticular extension of right ulna, subsequent encounter for closed fracture with routine healing: Secondary | ICD-10-CM | POA: Diagnosis not present

## 2019-08-13 ENCOUNTER — Telehealth: Payer: Self-pay

## 2019-08-13 NOTE — Telephone Encounter (Signed)
Pt requesting refill [facility]oquel [facility]. Pt has not contacted pharmacy. Advised pt seroquel refill was [facility] on 07/10/19 for # 90 x 3. Pt will ck with pharmacy and nothing further needed.

## 2019-08-19 DIAGNOSIS — S52031D Displaced fracture of olecranon process with intraarticular extension of right ulna, subsequent encounter for closed fracture with routine healing: Secondary | ICD-10-CM | POA: Diagnosis not present

## 2019-08-21 DIAGNOSIS — M059 Rheumatoid arthritis with rheumatoid factor, unspecified: Secondary | ICD-10-CM | POA: Diagnosis not present

## 2019-08-21 DIAGNOSIS — Z79899 Other long term (current) drug therapy: Secondary | ICD-10-CM | POA: Diagnosis not present

## 2019-08-27 DIAGNOSIS — S52031D Displaced fracture of olecranon process with intraarticular extension of right ulna, subsequent encounter for closed fracture with routine healing: Secondary | ICD-10-CM | POA: Diagnosis not present

## 2019-09-03 DIAGNOSIS — S52031D Displaced fracture of olecranon process with intraarticular extension of right ulna, subsequent encounter for closed fracture with routine healing: Secondary | ICD-10-CM | POA: Diagnosis not present

## 2019-09-10 DIAGNOSIS — S52031D Displaced fracture of olecranon process with intraarticular extension of right ulna, subsequent encounter for closed fracture with routine healing: Secondary | ICD-10-CM | POA: Diagnosis not present

## 2019-09-18 DIAGNOSIS — S52031D Displaced fracture of olecranon process with intraarticular extension of right ulna, subsequent encounter for closed fracture with routine healing: Secondary | ICD-10-CM | POA: Diagnosis not present

## 2019-09-26 DIAGNOSIS — N390 Urinary tract infection, site not specified: Secondary | ICD-10-CM | POA: Diagnosis not present

## 2019-09-26 DIAGNOSIS — R3915 Urgency of urination: Secondary | ICD-10-CM | POA: Diagnosis not present

## 2019-09-26 DIAGNOSIS — R300 Dysuria: Secondary | ICD-10-CM | POA: Diagnosis not present

## 2019-09-26 DIAGNOSIS — N760 Acute vaginitis: Secondary | ICD-10-CM | POA: Diagnosis not present

## 2019-09-26 DIAGNOSIS — S52031D Displaced fracture of olecranon process with intraarticular extension of right ulna, subsequent encounter for closed fracture with routine healing: Secondary | ICD-10-CM | POA: Diagnosis not present

## 2019-10-01 DIAGNOSIS — Z13820 Encounter for screening for osteoporosis: Secondary | ICD-10-CM | POA: Diagnosis not present

## 2019-10-07 DIAGNOSIS — S52031D Displaced fracture of olecranon process with intraarticular extension of right ulna, subsequent encounter for closed fracture with routine healing: Secondary | ICD-10-CM | POA: Diagnosis not present

## 2019-10-13 DIAGNOSIS — H34831 Tributary (branch) retinal vein occlusion, right eye, with macular edema: Secondary | ICD-10-CM | POA: Diagnosis not present

## 2019-10-19 ENCOUNTER — Other Ambulatory Visit: Payer: Self-pay | Admitting: Internal Medicine

## 2019-10-20 NOTE — Telephone Encounter (Signed)
Last filled 05-15-19 #180 Last OV 07-17-19 Next OV 01-15-20 [facility]

## 2019-10-29 DIAGNOSIS — N3281 Overactive bladder: Secondary | ICD-10-CM | POA: Diagnosis not present

## 2019-10-29 DIAGNOSIS — R3915 Urgency of urination: Secondary | ICD-10-CM | POA: Diagnosis not present

## 2019-11-03 ENCOUNTER — Telehealth: Payer: Self-pay | Admitting: Internal Medicine

## 2019-11-03 DIAGNOSIS — H401132 Primary open-angle glaucoma, bilateral, moderate stage: Secondary | ICD-10-CM | POA: Diagnosis not present

## 2019-11-03 NOTE — Progress Notes (Signed)
°  Chronic Care Management   Outreach Note  11/03/2019 Name: Diana Roberson MRN: [id number] DOB: 1945/12/01  Referred by: Karie Schwalbe, MD Reason for referral : No chief complaint on file.   An unsuccessful telephone outreach was attempted today. The patient was referred to the pharmacist for assistance with care management and care coordination.   Follow Up Plan:   [name] [employer] Scheduler

## 2019-11-06 ENCOUNTER — Telehealth: Payer: Self-pay | Admitting: Internal Medicine

## 2019-11-06 NOTE — Chronic Care Management (AMB) (Signed)
  Chronic Care Management   Note  11/06/2019 Name: CHANEL MCADAMS MRN: [id number] DOB: [birth year]  Diana Roberson is a 74 y.o. year old female who is a primary care patient of Letvak, Theophilus Kinds, MD. I reached out to Norva Karvonen by phone today in response to a referral sent by Ms. Garret Reddish Brereton's PCP, Venia Carbon, MD.   Ms. Lemonds was given information about Chronic Care Management services today including:  1. CCM service includes personalized support from designated clinical staff supervised by her physician, including individualized plan of care and coordination with other care providers 2. 24/7 contact phone numbers for assistance for urgent and routine care needs. 3. Service will only be billed when office clinical staff spend 20 minutes or more in a month to coordinate care. 4. Only one practitioner may furnish and bill the service in a calendar month. 5. The patient may stop CCM services at any time (effective at the end of the month) by phone call to the office staff. 6. The patient will be responsible for cost sharing (co-pay) of up to 20% of the service fee (after annual deductible is met).  Patient agreed to services and verbal consent obtained.   Follow up plan:   [name] [employer] Scheduler

## 2019-11-24 ENCOUNTER — Telehealth: Payer: Self-pay

## 2019-11-24 DIAGNOSIS — E785 Hyperlipidemia, unspecified: Secondary | ICD-10-CM

## 2019-11-24 DIAGNOSIS — K219 Gastro-esophageal reflux disease without esophagitis: Secondary | ICD-10-CM

## 2019-11-24 NOTE — Chronic Care Management (AMB) (Signed)
Chronic Care Management Pharmacy  Name: Diana Roberson  MRN: [id number] DOB: June 22, 1946  Chief Complaint/ HPI  Diana Roberson,  74 y.o., female presents for their Initial CCM visit with the clinical pharmacist via telephone.  PCP : Karie Schwalbe, MD  Their chronic conditions include: TIA, GERD, type 2 diabetes, osteopenia, RA, hyperlipidemia, bipolar disorder, chronic back pain, glaucoma   Pt requests: requesting letter for handicap card, current has expired; needs prescription for PT, she fell back in October, broke elbow, previous PT recommended further treatment PT at hospital [facility], worried about falls  Patient concerns: reports some dry mouth  -> try Biotene mouthwash   Office Visits:  07/17/19: Alphonsus Sias - [facility], continue current meds   Consult Visit:  10/29/19: OAB - symptoms resolved   07/04/19: Fracture of upper end of ulna   Allergies  Allergen Reactions  . Codeine Sulfate Other (See Comments)    Indigestion and epigastric pain  . Sertraline Hcl Other (See Comments)    REACTION: Worse, ? irritable   Medications: Outpatient Encounter Medications as of 11/25/2019  Medication Sig  . aspirin 325 MG tablet Take 325 mg by mouth every other day.   Marland Kitchen atorvastatin (LIPITOR) 20 MG tablet TAKE 1 TABLET BY MOUTH  DAILY (Patient taking differently: Take 20 mg by mouth daily. )  . brimonidine (ALPHAGAN) 0.2 % ophthalmic solution Place 1 drop into both eyes 2 (two) times daily.  . calcium gluconate 500 MG tablet Take 1 tablet by mouth 2 (two) times daily.   . cholecalciferol (VITAMIN D) 1000 UNITS tablet Take 1,000 Units by mouth daily.  . cyclobenzaprine (FLEXERIL) 10 MG tablet TAKE 1 TABLET BY MOUTH 3  TIMES DAILY AS NEEDED FOR  MUSCLE SPASM(S)  . dorzolamide-timolol (COSOPT) 22.3-6.8 MG/ML ophthalmic solution Place 1 drop into both eyes 2 (two) times daily.  . famotidine (PEPCID) 20 MG tablet TAKE 1 TABLET BY MOUTH TWO  TIMES DAILY (Patient taking differently: Take 20 mg by  mouth daily. In am)  . folic acid (FOLVITE) 1 MG tablet Take 1 tablet (1 mg total) by mouth daily.  Marland Kitchen glucose blood (ONETOUCH VERIO) test strip Use to check blood sugar once a day. Dx Code E11.9  . lisinopril (ZESTRIL) 2.5 MG tablet TAKE 1 TABLET BY MOUTH  DAILY (Patient taking differently: Take 2.5 mg by mouth daily. )  . metFORMIN (GLUCOPHAGE) 500 MG tablet TAKE 1 TABLET BY MOUTH  TWICE DAILY (Patient taking differently: Take 500 mg by mouth 2 (two) times daily with a meal. )  . methotrexate (RHEUMATREX) 2.5 MG tablet TAKE 6 TABLETS BY MOUTH  ONCE A WEEK.  CAUTION:CHEMOTHERAPY.  PROTECT FROM LIGHT. (Patient taking differently: Take 15 mg by mouth once a week. )  . OneTouch Delica Lancets 33G MISC 1 each by Does not apply route daily. Dx Code E11.9  . QUEtiapine (SEROQUEL) 50 MG tablet TAKE 1 TABLET BY MOUTH AT  BEDTIME  . vitamin B-12 (CYANOCOBALAMIN) 1000 MCG tablet Take 1,000 mcg by mouth daily.    . vitamin E 400 UNIT capsule Take 400 Units by mouth daily.    . ziprasidone (GEODON) 80 MG capsule TAKE 1 TO 2 CAPSULES BY  MOUTH DAILY   No facility-administered encounter medications on file as of 11/25/2019.   Current Diagnosis/Assessment: Goals    . Pharmacy Care Plan     CARE PLAN ENTRY  Current Barriers:  . Chronic Disease Management support, education, and care coordination needs related to TIA, GERD, type 2  diabetes, osteopenia, RA, hyperlipidemia, bipolar disorder, chronic back pain, glaucoma  Pharmacist Clinical Goal(s):  . Remain up to date on vaccinations. Recommend tetanus vaccine (Tdap) and shingles vaccine (Shingrix) from local pharmacy at [facility].   . Maintain blood pressure within goal of less than 130/80 mmHg. Purchase a blood pressure monitor (Omron series with upper arm cuff) that fits well around your upper arm. . Improve bone health with daily calcium and vitamin D intake. Recommend continuing calcium 600 mg with vitamin D3 20 mcg daily. May discontinue  additional vitamin D supplement. Calcium citrate is usually best absorbed.  . Manage dry mouth. Recommend trying an over the counter Biotene dry mouth rinse or spray for dry mouth.  . Improve blood glucose control with A1c goal of less than 7%. Monitor blood glucose daily before breakfast. Will consult with Dr. Silvio Pate regarding metformin dose increase.   Interventions: . Comprehensive medication review performed.  Patient Self Care Activities:  . Self administers medications as prescribed  Initial goal documentation      Hyperlipidemia   Lipid Panel     Component Value Date/Time   CHOL 115 01/14/2019 1426   CHOL 205 (H) 01/17/2016 1545   TRIG 92.0 01/14/2019 1426   HDL 48.20 01/14/2019 1426   HDL 45 01/17/2016 1545   CHOLHDL 2 01/14/2019 1426   VLDL 18.4 01/14/2019 1426   LDLCALC 49 01/14/2019 1426   LDLCALC 129 (H) 01/17/2016 1545   LDLDIRECT 127.5 04/05/2010 1017   LABVLDL 31 01/17/2016 1545    LDL goal < 70 (TIA) Patient has failed these meds in past: none Patient is currently controlled on the following medications:   Aspirin 325 mg - 1 tablet every other day --> taking aspirin 81 mg daily   Atorvastatin 20 mg - 1 tablet daily  We discussed: taking aspirin 81 mg daily in the morning   Plan: Continue current medications  Diabetes   CMP Latest Ref Rng & Units 01/14/2019 11/01/2017 01/17/2016  Glucose 70 - 99 mg/dL 125(H) 161(H) 117(H)  BUN 6 - 23 mg/dL 17 23 15   Creatinine 0.40 - 1.20 mg/dL 0.80 0.84 0.88  Sodium 135 - 145 mEq/L 139 140 142  Potassium 3.5 - 5.1 mEq/L 4.8 4.2 4.6  Chloride 96 - 112 mEq/L 106 103 101  CO2 19 - 32 mEq/L 27 27 23   Calcium 8.4 - 10.5 mg/dL 9.3 9.9 9.8  Total Protein 6.0 - 8.3 g/dL 6.8 - 7.6  Total Bilirubin 0.2 - 1.2 mg/dL 0.4 - 0.3  Alkaline Phos 39 - 117 U/L 85 - 93  AST 0 - 37 U/L 16 - 14  ALT 0 - 35 U/L 14 - 10   Recent Relevant Labs: Lab Results  Component Value Date/Time   HGBA1C 8.4 (A) 07/17/2019 03:09 PM   HGBA1C 7.2  (H) 01/14/2019 02:26 PM   HGBA1C 7.0 (H) 11/01/2017 04:48 PM   MICROALBUR 1.7 04/30/2015 12:43 PM   MICROALBUR 2.1 (H) 06/09/2013 12:54 PM    Checking BG: Weekly Recent FBG Readings: low 200s  Patient has failed these meds in past: none  Patient is currently uncontrolled on the following medications:   Metformin 500 mg - 1 tablet BID   Last diabetic eye exam:  Lab Results  Component Value Date/Time   HMDIABEYEEXA No Retinopathy 05/05/2019 12:00 AM    Last diabetic foot exam:  Lab Results  Component Value Date/Time   HMDIABFOOTEX done 11/01/2017 12:00 AM    We discussed: last visit, discussed dietary changes,  reports A1c was 8.7% through [facility] about a month ago   Plan: Recommend increasing metformin to 1 tab AM and 2 tabs PM, will consult with PCP, call in 1 week for blood glucose log. Check blood glucose daily in the morning.   GERD   Patient has failed these meds in past: none Patient is currently controlled on the following medications:   Famotidine 20 mg - 1 tablet BID   We discussed: taking every other day, noticed some reflux, so taking once every day  Plan: Continue current medications  Glaucoma   Followed by specialty Patient has failed these meds in past: none Patient is currently controlled on the following medications:   Brimonidine 0.2% - Place 1 drop into both eyes BID  Cosopt - 1 drop on both eyes BID   Plan: Continue current medications  Bipolar Disorder   Followed by Dr. Alphonsus Sias  Patient has failed these meds in past: none Patient is currently controlled on the following medications:   Quetiapine 50 mg - 1 tablet daily at bedtime  Ziprasidone 80 mg - 1-2 capsule daily   We discussed: doing very well, denies issues  Plan: Continue current medications  Osteopenia    Patient has failed these meds in past: none  Patient is currently controlled on the following medications:   Calcium 600 mg/Vitamin D3 800 IU - 1 tablet BID  Cholecalciferol  1000 IU (25 mcg)- 1 tablet daily   We discussed: d/c additional vitamin D, calcium citrate preferred   Plan: Continue current medications  Rheumatoid Arthritis    Followed by rheumotology Patient has failed these meds in past: none  Patient is currently on the following medications:   Methotrexate 2.5 mg - 7 tablets once a week  Folic acid 1 mg - 1 tablet daily  Plan: Continue current medications  Pain    Symptoms: chronic back pain Patient has failed these meds in past: denies  Patient is currently controlled on the following medications:   Cyclobenzaprine 10 mg - 1 tablet TID PRN  Lidocaine patches OTC - applies once daily   We discussed: started back on Flexeril recently due to problem with back, takes it up to TID several days a week, pain is well controlled; takes an occasional Aleve - less than once a week   Plan: Continue current medications  No diagnosis: high blood pressure   Office blood pressures are: BP Readings from Last 3 Encounters:  07/17/19 124/80  06/24/19 (!) 157/99  06/20/19 (!) 100/48   Patient has failed these meds in the past: none Patient checks BP at home: none, home monitor not working Patient home BP readings are ranging:   Lisinopril 2.5 mg - 1 tablet daily   We discussed: purchase new home monitor, f/u in 1 week  Plan: Continue current medications   Medication Management  OTCs: Vitamin E 400 IU - daily, B12 1000 mcg - daily   Pharmacy[facility]Benefits: [facility]   Adherence: pillbox, rarely misses doses  Affordability: no concerns  Vaccines: recommend Tdap, Shingrix   CCM Follow Up: 1 week for BG review 12/02/19 at 1:30 PM  Phil Dopp, PharmD Clinical Pharmacist [place] [facility] [phone]

## 2019-11-24 NOTE — Telephone Encounter (Signed)
I would like to request a referral for Diana Roberson to chronic care management pharmacy services for the following conditions:   GERD [K21.9]  Hyperlipidemia  [E78.5]  Phil Dopp, PharmD Clinical Pharmacist [facility] [phone]

## 2019-11-25 ENCOUNTER — Ambulatory Visit: Payer: Medicare Other

## 2019-11-25 ENCOUNTER — Other Ambulatory Visit: Payer: Self-pay

## 2019-11-25 DIAGNOSIS — E785 Hyperlipidemia, unspecified: Secondary | ICD-10-CM

## 2019-11-25 DIAGNOSIS — M549 Dorsalgia, unspecified: Secondary | ICD-10-CM

## 2019-11-25 DIAGNOSIS — G8929 Other chronic pain: Secondary | ICD-10-CM

## 2019-11-25 DIAGNOSIS — E1165 Type 2 diabetes mellitus with hyperglycemia: Secondary | ICD-10-CM

## 2019-11-25 DIAGNOSIS — F3174 Bipolar disorder, in full remission, most recent episode manic: Secondary | ICD-10-CM

## 2019-11-25 DIAGNOSIS — K219 Gastro-esophageal reflux disease without esophagitis: Secondary | ICD-10-CM

## 2019-11-25 DIAGNOSIS — H409 Unspecified glaucoma: Secondary | ICD-10-CM

## 2019-11-25 NOTE — Telephone Encounter (Signed)
Referral created.

## 2019-11-26 NOTE — Patient Instructions (Addendum)
November 26, 2019  Dear [name],  It was a pleasure meeting you during our initial appointment on November 26, 2019. Below is a summary of the goals we discussed and components of chronic care management. Please contact me anytime with questions or concerns.   Visit Information  Goals Addressed            This Visit's Progress   . Pharmacy Care Plan       CARE PLAN ENTRY  Current Barriers:  . Chronic Disease Management support, education, and care coordination needs related to TIA, GERD, type 2 diabetes, osteopenia, RA, hyperlipidemia, bipolar disorder, chronic back pain, glaucoma  Pharmacist Clinical Goal(s):  . Remain up to date on vaccinations. Recommend tetanus vaccine (Tdap) and shingles vaccine (Shingrix) from local pharmacy at [facility].   . Maintain blood pressure within goal of less than 130/80 mmHg. Purchase a blood pressure monitor (Omron series with upper arm cuff) that fits well around your upper arm. . Improve bone health with daily calcium and vitamin D intake. Recommend continuing calcium 600 mg with vitamin D3 20 mcg daily. May discontinue additional vitamin D supplement. Calcium citrate is usually best absorbed.  . Manage dry mouth. Recommend trying an over the counter Biotene dry mouth rinse or spray for dry mouth.  . Improve blood glucose control with A1c goal of less than 7%. Monitor blood glucose daily before breakfast. Will consult with Dr. Alphonsus Sias regarding metformin dose increase.   Interventions: . Comprehensive medication review performed.  Patient Self Care Activities:  . Self administers medications as prescribed  Initial goal documentation       Ms. Robards was given information about Chronic Care Management services today including:  1. CCM service includes personalized support from designated clinical staff supervised by her physician, including individualized plan of care and coordination with other care providers 2. 24/7 contact  phone numbers for assistance for urgent and routine care needs. 3. Standard insurance, coinsurance, copays and deductibles apply for chronic care management only during months in which we provide at least 20 minutes of these services. Most insurances cover these services at 100%, however patients may be responsible for any copay, coinsurance and/or deductible if applicable. This service may help you avoid the need for more expensive face-to-face services. 4. Only one practitioner may furnish and bill the service in a calendar month. 5. The patient may stop CCM services at any time (effective at the end of the month) by phone call to the office staff.  Patient agreed to services and verbal consent obtained.   The patient verbalized understanding of instructions provided today and agreed to receive a mailed copy of patient instruction and/or educational materials. Telephone follow up appointment with pharmacy team member scheduled for: 12/02/19 at 1:30 PM (telephone) for blood glucose log  Phil Dopp, PharmD Clinical Pharmacist [facility] at [facility] [phone]   DASH Eating Plan DASH stands for "Dietary Approaches to Stop Hypertension." The DASH eating plan is a healthy eating plan that has been shown to reduce high blood pressure (hypertension). It may also reduce your risk for type 2 diabetes, heart disease, and stroke. The DASH eating plan may also help with weight loss. What are tips for following this plan?  General guidelines  Avoid eating more than 2,300 mg (milligrams) of salt (sodium) a day. If you have hypertension, you may need to reduce your sodium intake to 1,500 mg a day.  Limit alcohol intake to no more than 1 drink  a day for nonpregnant women and 2 drinks a day for men. One drink equals 12 oz of beer, 5 oz of wine, or 1 oz of hard liquor.  Work with your health care provider to maintain a healthy body weight or to lose weight. Ask what an ideal weight is  for you.  Get at least 30 minutes of exercise that causes your heart to beat faster (aerobic exercise) most days of the week. Activities may include walking, swimming, or biking.  Work with your health care provider or diet and nutrition specialist (dietitian) to adjust your eating plan to your individual calorie needs. Reading food labels   Check food labels for the amount of sodium per serving. Choose foods with less than 5 percent of the Daily Value of sodium. Generally, foods with less than 300 mg of sodium per serving fit into this eating plan.  To find whole grains, look for the word "whole" as the first word in the ingredient list. Shopping  Buy products labeled as "low-sodium" or "no salt added."  Buy fresh foods. Avoid canned foods and premade or frozen meals. Cooking  Avoid adding salt when cooking. Use salt-free seasonings or herbs instead of table salt or sea salt. Check with your health care provider or pharmacist before using salt substitutes.  Do not fry foods. Cook foods using healthy methods such as baking, boiling, grilling, and broiling instead.  Cook with heart-healthy oils, such as olive, canola, soybean, or sunflower oil. Meal planning  Eat a balanced diet that includes: ? 5 or more servings of fruits and vegetables each day. At each meal, try to fill half of your plate with fruits and vegetables. ? Up to 6-8 servings of whole grains each day. ? Less than 6 oz of lean meat, poultry, or fish each day. A 3-oz serving of meat is about the same size as a deck of cards. One egg equals 1 oz. ? 2 servings of low-fat dairy each day. ? A serving of nuts, seeds, or beans 5 times each week. ? Heart-healthy fats. Healthy fats called Omega-3 fatty acids are found in foods such as flaxseeds and coldwater fish, like sardines, salmon, and mackerel.  Limit how much you eat of the following: ? Canned or prepackaged foods. ? Food that is high in trans fat, such as fried  foods. ? Food that is high in saturated fat, such as fatty meat. ? Sweets, desserts, sugary drinks, and other foods with added sugar. ? Full-fat dairy products.  Do not salt foods before eating.  Try to eat at least 2 vegetarian meals each week.  Eat more home-cooked food and less restaurant, buffet, and fast food.  When eating at a restaurant, ask that your food be prepared with less salt or no salt, if possible. What foods are recommended? The items listed may not be a complete list. Talk with your dietitian about what dietary choices are best for you. Grains Whole-grain or whole-wheat bread. Whole-grain or whole-wheat pasta. Brown rice. Orpah Cobb. Bulgur. Whole-grain and low-sodium cereals. Pita bread. Low-fat, low-sodium crackers. Whole-wheat flour tortillas. Vegetables Fresh or frozen vegetables (raw, steamed, roasted, or grilled). Low-sodium or reduced-sodium tomato and vegetable juice. Low-sodium or reduced-sodium tomato sauce and tomato paste. Low-sodium or reduced-sodium canned vegetables. Fruits All fresh, dried, or frozen fruit. Canned fruit in natural juice (without added sugar). Meat and other protein foods Skinless chicken or [place]. Ground chicken or [place]. Pork with fat trimmed off. Fish and seafood. Egg whites. Dried beans,  peas, or lentils. Unsalted nuts, nut butters, and seeds. Unsalted canned beans. Lean cuts of beef with fat trimmed off. Low-sodium, lean deli meat. Dairy Low-fat (1%) or fat-free (skim) milk. Fat-free, low-fat, or reduced-fat cheeses. Nonfat, low-sodium ricotta or cottage cheese. Low-fat or nonfat yogurt. Low-fat, low-sodium cheese. Fats and oils Soft margarine without trans fats. Vegetable oil. Low-fat, reduced-fat, or light mayonnaise and salad dressings (reduced-sodium). Canola, safflower, olive, soybean, and sunflower oils. Avocado. Seasoning and other foods Herbs. Spices. Seasoning mixes without salt. Unsalted popcorn and pretzels. Fat-free  sweets. What foods are not recommended? The items listed may not be a complete list. Talk with your dietitian about what dietary choices are best for you. Grains Baked goods made with fat, such as croissants, muffins, or some breads. Dry pasta or rice meal packs. Vegetables Creamed or fried vegetables. Vegetables in a cheese sauce. Regular canned vegetables (not low-sodium or reduced-sodium). Regular canned tomato sauce and paste (not low-sodium or reduced-sodium). Regular tomato and vegetable juice (not low-sodium or reduced-sodium). Angie Fava. Olives. Fruits Canned fruit in a light or heavy syrup. Fried fruit. Fruit in cream or butter sauce. Meat and other protein foods Fatty cuts of meat. Ribs. Fried meat. Berniece Salines. Sausage. Bologna and other processed lunch meats. Salami. Fatback. Hotdogs. Bratwurst. Salted nuts and seeds. Canned beans with added salt. Canned or smoked fish. Whole eggs or egg yolks. Chicken or [place] with skin. Dairy Whole or 2% milk, cream, and half-and-half. Whole or full-fat cream cheese. Whole-fat or sweetened yogurt. Full-fat cheese. Nondairy creamers. Whipped toppings. Processed cheese and cheese spreads. Fats and oils Butter. Stick margarine. Lard. Shortening. Ghee. Bacon fat. Tropical oils, such as coconut, palm kernel, or palm oil. Seasoning and other foods Salted popcorn and pretzels. Onion salt, garlic salt, seasoned salt, table salt, and sea salt. Worcestershire sauce. Tartar sauce. Barbecue sauce. Teriyaki sauce. Soy sauce, including reduced-sodium. Steak sauce. Canned and packaged gravies. Fish sauce. Oyster sauce. Cocktail sauce. Horseradish that you find on the shelf. Ketchup. Mustard. Meat flavorings and tenderizers. Bouillon cubes. Hot sauce and Tabasco sauce. Premade or packaged marinades. Premade or packaged taco seasonings. Relishes. Regular salad dressings. Where to find more information:  National Heart, Lung, and [place]:  https://wilson-eaton.com/  American Heart Association: www.heart.org Summary  The DASH eating plan is a healthy eating plan that has been shown to reduce high blood pressure (hypertension). It may also reduce your risk for type 2 diabetes, heart disease, and stroke.  With the DASH eating plan, you should limit salt (sodium) intake to 2,300 mg a day. If you have hypertension, you may need to reduce your sodium intake to 1,500 mg a day.  When on the DASH eating plan, aim to eat more fresh fruits and vegetables, whole grains, lean proteins, low-fat dairy, and heart-healthy fats.  Work with your health care provider or diet and nutrition specialist (dietitian) to adjust your eating plan to your individual calorie needs. This information is not intended to replace advice given to you by your health care provider. Make sure you discuss any questions you have with your health care provider. Document Revised: 08/10/2017 Document Reviewed: 08/21/2016 Elsevier Patient Education  2020 [employer].

## 2019-12-02 ENCOUNTER — Ambulatory Visit: Payer: Medicare Other

## 2019-12-02 ENCOUNTER — Other Ambulatory Visit: Payer: Self-pay

## 2019-12-02 ENCOUNTER — Telehealth: Payer: Medicare Other

## 2019-12-02 DIAGNOSIS — E785 Hyperlipidemia, unspecified: Secondary | ICD-10-CM

## 2019-12-02 DIAGNOSIS — E1165 Type 2 diabetes mellitus with hyperglycemia: Secondary | ICD-10-CM

## 2019-12-02 NOTE — Chronic Care Management (AMB) (Signed)
Chronic Care Management Pharmacy  Name: Diana Roberson  MRN: [id number] DOB: 1945/09/21  Chief Complaint/ HPI  Diana Roberson,  74 y.o., female presents for their Follow-Up CCM visit with the clinical pharmacist via telephone.  PCP : Karie Schwalbe, MD  Their chronic conditions addressed today include: type 2 diabetes, osteopenia, hyperlipidemia, elevated blood pressure  Office Visits: none since CCM visit on 12/02/19  Allergies  Allergen Reactions  . Codeine Sulfate Other (See Comments)    Indigestion and epigastric pain  . Sertraline Hcl Other (See Comments)    REACTION: Worse, ? irritable   Medications: Outpatient Encounter Medications as of 12/02/2019  Medication Sig  . aspirin 325 MG tablet Take 325 mg by mouth every other day. Taking aspirin 81 mg daily  . aspirin EC 81 MG tablet Take 81 mg by mouth daily.  Marland Kitchen atorvastatin (LIPITOR) 20 MG tablet TAKE 1 TABLET BY MOUTH  DAILY  . brimonidine (ALPHAGAN) 0.2 % ophthalmic solution Place 1 drop into both eyes 2 (two) times daily.  . calcium gluconate 500 MG tablet Take 1 tablet by mouth 2 (two) times daily.   . cholecalciferol (VITAMIN D) 1000 UNITS tablet Take 1,000 Units by mouth daily.  . cyclobenzaprine (FLEXERIL) 10 MG tablet TAKE 1 TABLET BY MOUTH 3  TIMES DAILY AS NEEDED FOR  MUSCLE SPASM(S)  . dorzolamide-timolol (COSOPT) 22.3-6.8 MG/ML ophthalmic solution Place 1 drop into both eyes 2 (two) times daily.  . famotidine (PEPCID) 20 MG tablet TAKE 1 TABLET BY MOUTH TWO  TIMES DAILY (Patient taking differently: Take 20 mg by mouth daily. In am)  . folic acid (FOLVITE) 1 MG tablet Take 1 tablet (1 mg total) by mouth daily.  Marland Kitchen glucose blood (ONETOUCH VERIO) test strip Use to check blood sugar once a day. Dx Code E11.9  . lisinopril (ZESTRIL) 2.5 MG tablet TAKE 1 TABLET BY MOUTH  DAILY  . metFORMIN (GLUCOPHAGE) 500 MG tablet TAKE 1 TABLET BY MOUTH  TWICE DAILY  . methotrexate (RHEUMATREX) 2.5 MG tablet TAKE 6 TABLETS BY  MOUTH  ONCE A WEEK.  CAUTION:CHEMOTHERAPY.  PROTECT FROM LIGHT. (Patient taking differently: Take 15 mg by mouth once a week. )  . OneTouch Delica Lancets 33G MISC 1 each by Does not apply route daily. Dx Code E11.9  . QUEtiapine (SEROQUEL) 50 MG tablet TAKE 1 TABLET BY MOUTH AT  BEDTIME  . vitamin B-12 (CYANOCOBALAMIN) 1000 MCG tablet Take 1,000 mcg by mouth daily.    . vitamin E 400 UNIT capsule Take 400 Units by mouth daily.    . ziprasidone (GEODON) 80 MG capsule TAKE 1 TO 2 CAPSULES BY  MOUTH DAILY   No facility-administered encounter medications on file as of 12/02/2019.   Current Diagnosis/Assessment: Goals    . Pharmacy Care Plan     CARE PLAN ENTRY  Current Barriers:  . Chronic Disease Management support, education, and care coordination needs related to TIA, GERD, type 2 diabetes, osteopenia, RA, hyperlipidemia, bipolar disorder, chronic back pain, glaucoma  Pharmacist Clinical Goal(s):  . Remain up to date on vaccinations. Recommend tetanus vaccine (Tdap) and shingles vaccine (Shingrix) from local pharmacy at [facility].   . Maintain blood pressure within goal of less than 140/90 mmHg. Recommend checking blood pressure prior to breakfast, morning medications, and coffee. May also check prior to evening meal. Always rest for 5 minutes prior to checking blood pressure. Repeat check after 2-3 minutes if blood pressure is above 140/90.  . Manage dry mouth.  Recommend trying an over the counter Biotene dry mouth rinse or spray for dry mouth.  . Improve blood glucose control with A1c goal of less than 7%. Monitor blood glucose daily before breakfast. Check again 1-2 hours after largest meal of the day.   Interventions: . Comprehensive medication review performed.  Patient Self Care Activities:  . Self administers medications as prescribed . Self-monitors blood pressure and blood glucose  Please see past updates related to this goal by clicking on the "Past Updates" button in  the selected goal        Hyperlipidemia   Lipid Panel     Component Value Date/Time   CHOL 115 01/14/2019 1426   CHOL 205 (H) 01/17/2016 1545   TRIG 92.0 01/14/2019 1426   HDL 48.20 01/14/2019 1426   HDL 45 01/17/2016 1545   CHOLHDL 2 01/14/2019 1426   VLDL 18.4 01/14/2019 1426   LDLCALC 49 01/14/2019 1426   LDLCALC 129 (H) 01/17/2016 1545   LDLDIRECT 127.5 04/05/2010 1017   LABVLDL 31 01/17/2016 1545    CBC Latest Ref Rng & Units 01/14/2019 04/30/2015 02/09/2014  WBC 4.0 - 10.5 K/uL 6.3 7.9 8.5  Hemoglobin 12.0 - 15.0 g/dL 11.1(L) 11.4(L) 11.6(L)  Hematocrit 36.0 - 46.0 % 32.1(L) 33.8(L) 34.9(L)  Platelets 150.0 - 400.0 K/uL 275.0 371.0 381.0   LDL goal < 70 (TIA) Patient has failed these meds in past: none Patient is currently controlled on the following medications:   Aspirin 81 mg - 1 tablet daily   Atorvastatin 20 mg - 1 tablet daily  We discussed: CBC stable, confirmed adherence  Plan: Continue current medications  Diabetes   CMP Latest Ref Rng & Units 01/14/2019 11/01/2017 01/17/2016  Glucose 70 - 99 mg/dL 309(M) 076(K) 088(P)  BUN 6 - 23 mg/dL 17 23 15   Creatinine 0.40 - 1.20 mg/dL 1.03 1.59  Sodium 135 - 145 mEq/L 139 140 142  Potassium 3.5 - 5.1 mEq/L 4.8 4.2 4.6  Chloride 96 - 112 mEq/L 106 103 101  CO2 19 - 32 mEq/L 27 27 23   Calcium 8.4 - 10.5 mg/dL 9.3 9.9 9.8  Total Protein 6.0 - 8.3 g/dL 6.8 - 7.6  Total Bilirubin 0.2 - 1.2 mg/dL 0.4 - 0.3  Alkaline Phos 39 - 117 U/L 85 - 93  AST 0 - 37 U/L 16 - 14  ALT 0 - 35 U/L 14 - 10   Recent Relevant Labs: Lab Results  Component Value Date/Time   HGBA1C 8.4 (A) 07/17/2019 03:09 PM   HGBA1C 7.2 (H) 01/14/2019 02:26 PM   HGBA1C 7.0 (H) 11/01/2017 04:48 PM   MICROALBUR 1.7 04/30/2015 12:43 PM   MICROALBUR 2.1 (H) 06/09/2013 12:54 PM    Checking BG: Daily Recent FBG Readings: improved from low 200s last week (12/02/19) to 153, 139, 133, 80, 139 this week with metformin dose increase  Patient has failed  these meds in past: none  Patient is currently uncontrolled on the following medications:   Metformin 500 mg - 1 tablet with breakfast and 2 tablets with evening meal  Last diabetic eye exam:  Lab Results  Component Value Date/Time   HMDIABEYEEXA No Retinopathy 05/05/2019 12:00 AM    Last diabetic foot exam:  Lab Results  Component Value Date/Time   HMDIABFOOTEX done 11/01/2017 12:00 AM    We discussed: at visit on 3/23, discussed dietary changes, reported A1c was 8.7% through [facility] about in February; per PCP consult pt increased metformin this week and fasting BG has  much improved, denies adverse effects; recommended checking a post-prandial reading each day to further assess blood glucose control  Plan: Continue current medications. Continue checking blood glucose before breakfast. Recommend checking an additional time daily 1-2 hours after largest meal of the day.  Osteopenia    Patient has failed these meds in past: none  Patient is currently controlled on the following medications:   Calcium 600 mg/Vitamin D3 800 IU - 1 tablet BID  Cholecalciferol 1000 IU (25 mcg)- 1 tablet daily   We discussed: may stop additional vitamin D supplement, continue combination supplement BID for daily dietary intake   Plan: Continue current medications  No diagnosis: high blood pressure   Office blood pressures are: BP Readings from Last 3 Encounters:  07/17/19 124/80  06/24/19 (!) 157/99  06/20/19 (!) 100/48   BP >140/90 mmHg Patient has failed these meds in the past: none Patient checks BP at home: daily, checking after breakfast (within 30 minutes of 2 cups of coffee) Patient home BP readings are ranging: 144/100, 150/95, 164/94, 169/99, 139/75   Lisinopril 2.5 mg - 1 tablet daily   We discussed: recommend checking before breakfast (prior to coffee), or before evening meal, always check after resting 5 minutes, recheck in 2-3 minutes if blood pressure is higher than 140/90  mmHg  Plan: Continue current medications; Change timing of blood pressure monitoring prior to caffeine.   Medication Management  OTCs: Vitamin E 400 IU - daily, B12 1000 mcg - daily   Pharmacy[facility]Benefits: [facility]   Adherence: pillbox, rarely misses doses  Affordability: no concerns  Vaccines: recommend Tdap, Shingrix   CCM Follow Up: 1 week for BG/BP review 12/11/19 at 1:30 PM  Debbora Dus, PharmD Clinical Pharmacist [facility] at [facility] [phone]

## 2019-12-04 ENCOUNTER — Telehealth: Payer: Self-pay

## 2019-12-04 NOTE — Telephone Encounter (Signed)
The home based ankle test is not that reliable. She had a formal bone density done 4 years ago and it looked pretty good. We can just discuss this again at her next wellness visit and decide if we should do another formal test

## 2019-12-04 NOTE — Telephone Encounter (Signed)
Spoke to pt. She is fine with discussing later.

## 2019-12-04 NOTE — Telephone Encounter (Signed)
I called the Diana Roberson at Dr Karle Starch request in regards to a recent Bone Density Screening she had done. Dr Alphonsus Sias stated he did not trust the results. Diana Roberson is asking why he doesn't trust them and what her next step should be.

## 2019-12-08 ENCOUNTER — Telehealth: Payer: Self-pay

## 2019-12-08 DIAGNOSIS — G8929 Other chronic pain: Secondary | ICD-10-CM

## 2019-12-08 NOTE — Telephone Encounter (Signed)
Referral created for PT. Handicap Placard form place in Dr Karle Starch Inbox to sign.

## 2019-12-08 NOTE — Telephone Encounter (Signed)
Handicapped form done.  I actually can't put in a referral for PT without knowing why. Is she having pain? Is the range of motion for her elbow not normal--or getting worse?

## 2019-12-08 NOTE — Telephone Encounter (Signed)
During CCM visit [name] made the following requests:   A letter for handicap permit, current one has expired  A prescription for physical therapy; She reports falling around October 2020 with broken elbow. She completed PT but prior therapist recommended she continue additional physical therapy at [facility].   Let me know if further information is needed.  Phil Dopp, PharmD Clinical Pharmacist [facility] at [facility] [phone]

## 2019-12-08 NOTE — Telephone Encounter (Signed)
Please prepare handicapped permit and order for PT at [facility] for me to sign

## 2019-12-09 NOTE — Telephone Encounter (Signed)
Left message on VM that form was up front for pickup and that PT Referral has been created.

## 2019-12-11 ENCOUNTER — Ambulatory Visit: Payer: Medicare Other

## 2019-12-11 ENCOUNTER — Other Ambulatory Visit: Payer: Self-pay

## 2019-12-11 NOTE — Chronic Care Management (AMB) (Signed)
Chronic Care Management Pharmacy  Name: ANDRES ESCANDON  MRN: [id number] DOB: 1946/02/22  Chief Complaint/ HPI  [name],  74 y.o., female presents for their Follow-Up CCM visit with the clinical pharmacist via telephone.  PCP : Karie Schwalbe, MD  Their chronic conditions addressed today include: type 2 diabetes, osteopenia, hyperlipidemia, elevated blood pressure  Office Visits: none since CCM visit on 12/02/19  Allergies  Allergen Reactions  . Codeine Sulfate Other (See Comments)    Indigestion and epigastric pain  . Sertraline Hcl Other (See Comments)    REACTION: Worse, ? irritable   Medications: Outpatient Encounter Medications as of 12/11/2019  Medication Sig  . aspirin 325 MG tablet Take 325 mg by mouth every other day. Taking aspirin 81 mg daily  . aspirin EC 81 MG tablet Take 81 mg by mouth daily.  Marland Kitchen atorvastatin (LIPITOR) 20 MG tablet TAKE 1 TABLET BY MOUTH  DAILY  . brimonidine (ALPHAGAN) 0.2 % ophthalmic solution Place 1 drop into both eyes 2 (two) times daily.  . calcium gluconate 500 MG tablet Take 1 tablet by mouth 2 (two) times daily.   . cholecalciferol (VITAMIN D) 1000 UNITS tablet Take 1,000 Units by mouth daily.  . cyclobenzaprine (FLEXERIL) 10 MG tablet TAKE 1 TABLET BY MOUTH 3  TIMES DAILY AS NEEDED FOR  MUSCLE SPASM(S)  . dorzolamide-timolol (COSOPT) 22.3-6.8 MG/ML ophthalmic solution Place 1 drop into both eyes 2 (two) times daily.  . famotidine (PEPCID) 20 MG tablet TAKE 1 TABLET BY MOUTH TWO  TIMES DAILY (Patient taking differently: Take 20 mg by mouth daily. In am)  . folic acid (FOLVITE) 1 MG tablet Take 1 tablet (1 mg total) by mouth daily.  Marland Kitchen glucose blood (ONETOUCH VERIO) test strip Use to check blood sugar once a day. Dx Code E11.9  . lisinopril (ZESTRIL) 2.5 MG tablet TAKE 1 TABLET BY MOUTH  DAILY  . metFORMIN (GLUCOPHAGE) 500 MG tablet TAKE 1 TABLET BY MOUTH  TWICE DAILY  . methotrexate (RHEUMATREX) 2.5 MG tablet TAKE 6 TABLETS BY  MOUTH  ONCE A WEEK.  CAUTION:CHEMOTHERAPY.  PROTECT FROM LIGHT. (Patient taking differently: Take 15 mg by mouth once a week. )  . OneTouch Delica Lancets 33G MISC 1 each by Does not apply route daily. Dx Code E11.9  . QUEtiapine (SEROQUEL) 50 MG tablet TAKE 1 TABLET BY MOUTH AT  BEDTIME  . vitamin B-12 (CYANOCOBALAMIN) 1000 MCG tablet Take 1,000 mcg by mouth daily.    . vitamin E 400 UNIT capsule Take 400 Units by mouth daily.    . ziprasidone (GEODON) 80 MG capsule TAKE 1 TO 2 CAPSULES BY  MOUTH DAILY   No facility-administered encounter medications on file as of 12/11/2019.   Current Diagnosis/Assessment: Goals    . Pharmacy Care Plan     CARE PLAN ENTRY  Current Barriers:  . Chronic Disease Management support, education, and care coordination needs related to TIA, GERD, type 2 diabetes, osteopenia, RA, hyperlipidemia, bipolar disorder, chronic back pain, glaucoma  Pharmacist Clinical Goal(s):  . Remain up to date on vaccinations. Recommend tetanus vaccine (Tdap) and shingles vaccine (Shingrix) from local pharmacy at [facility].   . Maintain blood pressure within goal of less than 140/90 mmHg. Recommend checking blood pressure prior to breakfast, morning medications, and coffee. May also check prior to evening meal. Always rest for 5 minutes prior to checking blood pressure. Repeat check after 2-3 minutes if blood pressure is above 140/90.  . Manage dry mouth.  Recommend trying an over the counter Biotene dry mouth rinse or spray for dry mouth.  . Improve blood glucose control with A1c goal of less than 7%. Monitor blood glucose daily before breakfast. Check again 1-2 hours after largest meal of the day.   Interventions: . Comprehensive medication review performed.  Patient Self Care Activities:  . Self administers medications as prescribed . Self-monitors blood pressure and blood glucose  Please see past updates related to this goal by clicking on the "Past Updates" button in  the selected goal       Diabetes   CMP Latest Ref Rng & Units 01/14/2019 11/01/2017 01/17/2016  Glucose 70 - 99 mg/dL 125(H) 161(H) 117(H)  BUN 6 - 23 mg/dL 17 23 15   Creatinine 0.40 - 1.20 mg/dL 0.80 0.84 0.88  Sodium 135 - 145 mEq/L 139 140 142  Potassium 3.5 - 5.1 mEq/L 4.8 4.2 4.6  Chloride 96 - 112 mEq/L 106 103 101  CO2 19 - 32 mEq/L 27 27 23   Calcium 8.4 - 10.5 mg/dL 9.3 9.9 9.8  Total Protein 6.0 - 8.3 g/dL 6.8 - 7.6  Total Bilirubin 0.2 - 1.2 mg/dL 0.4 - 0.3  Alkaline Phos 39 - 117 U/L 85 - 93  AST 0 - 37 U/L 16 - 14  ALT 0 - 35 U/L 14 - 10   Recent Relevant Labs: Lab Results  Component Value Date/Time   HGBA1C 8.4 (A) 07/17/2019 03:09 PM   HGBA1C 7.2 (H) 01/14/2019 02:26 PM   HGBA1C 7.0 (H) 11/01/2017 04:48 PM   MICROALBUR 1.7 04/30/2015 12:43 PM   MICROALBUR 2.1 (H) 06/09/2013 12:54 PM    Checking BG: Daily Recent FBG Readings:139, 233 (denies abnormal meal night before), 167, 197, 133, 156  Patient has failed these meds in past: none  Patient is currently uncontrolled on the following medications:   Metformin 500 mg - 1 tablet with breakfast and 2 tablets with evening meal   Last diabetic eye exam:  Lab Results  Component Value Date/Time   HMDIABEYEEXA No Retinopathy 05/05/2019 12:00 AM    Last diabetic foot exam:  Lab Results  Component Value Date/Time   HMDIABFOOTEX done 11/01/2017 12:00 AM    Recent changes: at visit on 11/25/19, discussed dietary changes, reported A1c was 8.7% through [facility] about in February 2021; per PCP consult pt increased metformin to 1 tablet AM 2 tablets PM on 11/25/19  We discussed: denies adverse effects, patient did not get to check any additional blood glucose readings outside of fasting this week, as fasting still elevated recommend increasing metformin to 2 tablets BID  Diet:   Snack: halo orange, carrots  Supper: meat and vegetables   Lunch: salad  Breakfast: sausage, bacon or ham with egg or biscuit, slice of honeydew  melon  Drinks: diet lemon tea daily, no sodas or juice, occasional milk, Slimfast   Plan: Recommend increasing metformin 500 mg to 2 tablets BID. Continue checking blood glucose before breakfast. Recommend checking BG daily 1-2 hours after largest meal of the day.  No diagnosis: high blood pressure   Office blood pressures are: BP Readings from Last 3 Encounters:  07/17/19 124/80  06/24/19 (!) 157/99  06/20/19 (!) 100/48   BP >140/90 mmHg Patient has failed these meds in the past: none Patient checks BP at home: daily Reports the following readings: 134/87, 164/80, 166/92, 171/94, 144/92, 188/84, 169/104, (all before supper, 6-7 PM) Morning after BF: 149/82  Patient is currently uncontrolled on the following medications:  Lisinopril 2.5 mg - 1 tablet daily   We discussed: recommend continuing to monitor and increasing lisinopril to 2 tablets daily   Plan: Continue current medications; Recommend increasing lisinopril 2.5 mg from 1 to 2 tablets daily.  Medication Management  Ph[facility]acy[facility]Benefits: [facility]   Adherence: pillbox, rarely misses doses  Affordability: no concerns  Vaccines: recommend Tdap, Shingrix   CCM Follow Up: 1 week for BG/BP review 12/18/19 at 1:00 PM  Phil Dopp, PharmD Clinical Pharmacist [facility] at [facility] [phone]

## 2019-12-15 ENCOUNTER — Telehealth: Payer: Self-pay

## 2019-12-15 DIAGNOSIS — R269 Unspecified abnormalities of gait and mobility: Secondary | ICD-10-CM

## 2019-12-15 NOTE — Telephone Encounter (Signed)
Dr Alphonsus Sias will need to see her so we can make proper documentation.

## 2019-12-15 NOTE — Telephone Encounter (Signed)
I can make referral for PT if she goes to the PT office. I only need to see her if she needs home health PT. Find out where she wants to go if she will go to the PT

## 2019-12-15 NOTE — Telephone Encounter (Signed)
There was a misunderstanding regarding Ms. Diana Roberson's PT request. She reports the hospital received the request for elbow pain. She is no longer having elbow pain from her last fall. She is interested in PT for balance and stability. She reports several minor falls lately and must lean on her husband for support when walking outside of the home.  Phil Dopp, PharmD Clinical Pharmacist [facility] at [facility] [phone]

## 2019-12-16 NOTE — Telephone Encounter (Signed)
Spoke to pt. She said this has been a new issue recently with her falls going back to late last year. Therapist for arm recommended she get gait/balance training. Said to do it at [facility] PT at [facility].

## 2019-12-17 NOTE — Telephone Encounter (Signed)
Spoke to pt's husband.

## 2019-12-17 NOTE — Telephone Encounter (Signed)
Please let her know I put in the referral and she should hear about it within 1-2 weeks

## 2019-12-18 ENCOUNTER — Telehealth: Payer: Self-pay

## 2019-12-18 ENCOUNTER — Ambulatory Visit: Payer: Medicare Other

## 2019-12-18 ENCOUNTER — Other Ambulatory Visit: Payer: Self-pay

## 2019-12-18 DIAGNOSIS — E1165 Type 2 diabetes mellitus with hyperglycemia: Secondary | ICD-10-CM

## 2019-12-18 DIAGNOSIS — E785 Hyperlipidemia, unspecified: Secondary | ICD-10-CM

## 2019-12-18 NOTE — Telephone Encounter (Signed)
I am fine with both of those changes. Make sure she brings her BP cuff with her at her next visit so we can confirm it is registering correctly

## 2019-12-18 NOTE — Chronic Care Management (AMB) (Signed)
Chronic Care Management Pharmacy  Name: Diana Roberson  MRN: [id number] DOB: April 27, 1946  Chief Complaint/ HPI  Diana Roberson,  74 y.o., female presents for their Follow-Up CCM visit with the clinical pharmacist via telephone.  PCP : Diana Schwalbe, MD  Their chronic conditions addressed today include: type 2 diabetes, elevated blood pressure  Office Visits: none since CCM visit on 12/11/19  Allergies  Allergen Reactions  . Codeine Sulfate Other (See Comments)    Indigestion and epigastric pain  . Sertraline Hcl Other (See Comments)    REACTION: Worse, ? irritable   Medications: Outpatient Encounter Medications as of 12/18/2019  Medication Sig  . aspirin 325 MG tablet Take 325 mg by mouth every other day. Taking aspirin 81 mg daily  . aspirin EC 81 MG tablet Take 81 mg by mouth daily.  Marland Kitchen atorvastatin (LIPITOR) 20 MG tablet TAKE 1 TABLET BY MOUTH  DAILY  . brimonidine (ALPHAGAN) 0.2 % ophthalmic solution Place 1 drop into both eyes 2 (two) times daily.  . calcium gluconate 500 MG tablet Take 1 tablet by mouth 2 (two) times daily.   . cholecalciferol (VITAMIN D) 1000 UNITS tablet Take 1,000 Units by mouth daily.  . cyclobenzaprine (FLEXERIL) 10 MG tablet TAKE 1 TABLET BY MOUTH 3  TIMES DAILY AS NEEDED FOR  MUSCLE SPASM(S)  . dorzolamide-timolol (COSOPT) 22.3-6.8 MG/ML ophthalmic solution Place 1 drop into both eyes 2 (two) times daily.  . famotidine (PEPCID) 20 MG tablet TAKE 1 TABLET BY MOUTH TWO  TIMES DAILY (Patient taking differently: Take 20 mg by mouth daily. In am)  . folic acid (FOLVITE) 1 MG tablet Take 1 tablet (1 mg total) by mouth daily.  Marland Kitchen glucose blood (ONETOUCH VERIO) test strip Use to check blood sugar once a day. Dx Code E11.9  . lisinopril (ZESTRIL) 2.5 MG tablet TAKE 1 TABLET BY MOUTH  DAILY  . metFORMIN (GLUCOPHAGE) 500 MG tablet TAKE 1 TABLET BY MOUTH  TWICE DAILY  . methotrexate (RHEUMATREX) 2.5 MG tablet TAKE 6 TABLETS BY MOUTH  ONCE A WEEK.   CAUTION:CHEMOTHERAPY.  PROTECT FROM LIGHT. (Patient taking differently: Take 15 mg by mouth once a week. )  . OneTouch Delica Lancets 33G MISC 1 each by Does not apply route daily. Dx Code E11.9  . QUEtiapine (SEROQUEL) 50 MG tablet TAKE 1 TABLET BY MOUTH AT  BEDTIME  . vitamin B-12 (CYANOCOBALAMIN) 1000 MCG tablet Take 1,000 mcg by mouth daily.    . vitamin E 400 UNIT capsule Take 400 Units by mouth daily.    . ziprasidone (GEODON) 80 MG capsule TAKE 1 TO 2 CAPSULES BY  MOUTH DAILY   No facility-administered encounter medications on file as of 12/18/2019.   Current Diagnosis/Assessment: Goals    . Pharmacy Care Plan     CARE PLAN ENTRY  Current Barriers:  . Chronic Disease Management support, education, and care coordination needs related to TIA, GERD, type 2 diabetes, osteopenia, RA, hyperlipidemia, bipolar disorder, chronic back pain, glaucoma  Pharmacist Clinical Goal(s):  . Remain up to date on vaccinations. Recommend tetanus vaccine (Tdap) and shingles vaccine (Shingrix) from local pharmacy at [facility].   . Maintain blood pressure within goal of less than 140/90 mmHg. Recommend checking blood pressure prior to breakfast, morning medications, and coffee. May also check prior to evening meal. Always rest for 5 minutes prior to checking blood pressure. Repeat check after 2-3 minutes if blood pressure is above 140/90.  . Manage dry mouth. Recommend trying  an over the counter Biotene dry mouth rinse or spray for dry mouth.  . Improve blood glucose control with A1c goal of less than 7%. Monitor blood glucose daily before breakfast. Check again 1-2 hours after largest meal of the day.   Interventions: . Comprehensive medication review performed.  Patient Self Care Activities:  . Self administers medications as prescribed . Self-monitors blood pressure and blood glucose  Please see past updates related to this goal by clicking on the "Past Updates" button in the selected goal        Diabetes   CMP Latest Ref Rng & Units 01/14/2019 11/01/2017 01/17/2016  Glucose 70 - 99 mg/dL 299(M) 426(S) 341(D)  BUN 6 - 23 mg/dL 17 23 15   Creatinine 0.40 - 1.20 mg/dL 6.22 2.97  Sodium 135 - 145 mEq/L 139 140 142  Potassium 3.5 - 5.1 mEq/L 4.8 4.2 4.6  Chloride 96 - 112 mEq/L 106 103 101  CO2 19 - 32 mEq/L 27 27 23   Calcium 8.4 - 10.5 mg/dL 9.3 9.9 9.8  Total Protein 6.0 - 8.3 g/dL 6.8 - 7.6  Total Bilirubin 0.2 - 1.2 mg/dL 0.4 - 0.3  Alkaline Phos 39 - 117 U/L 85 - 93  AST 0 - 37 U/L 16 - 14  ALT 0 - 35 U/L 14 - 10   Recent Relevant Labs: Lab Results  Component Value Date/Time   HGBA1C 8.4 (A) 07/17/2019 03:09 PM   HGBA1C 7.2 (H) 01/14/2019 02:26 PM   HGBA1C 7.0 (H) 11/01/2017 04:48 PM   MICROALBUR 1.7 04/30/2015 12:43 PM   MICROALBUR 2.1 (H) 06/09/2013 12:54 PM    Checking BG: Daily  Reports the following readings:   Fasting: 130  After breakfast: 166, 190   Before lunch: 140  1 hour after lunch: 126, 170   Denies s/s hypoglycemia   Patient has failed these meds in past: none  Patient is currently controlled on the following medications:   Metformin 500 mg - 2 tablets with breakfast and 2 tablets with evening meal   Last diabetic eye exam:  Lab Results  Component Value Date/Time   HMDIABEYEEXA No Retinopathy 05/05/2019 12:00 AM    Last diabetic foot exam:  Lab Results  Component Value Date/Time   HMDIABFOOTEX done 11/01/2017 12:00 AM    Recent changes: per PCP consult pt increased metformin to 2 tablets AM 2 tablets PM on 12/11/19  We discussed: denies adverse effects, post-prandial readings are near goal of < 180, fasting readings near goal of 80-130, no s/s hypoglycemia, recommend continuing current medications and checking fasting BG daily  Diet:   Snack: halo orange, carrots  Supper: meat and vegetables   Lunch: salad  Breakfast: sausage, bacon or ham with egg or biscuit, slice of honeydew melon  Drinks: diet lemon tea daily, no sodas  or juice, occasional milk, Slimfast   Plan: Continue current medications  No diagnosis: high blood pressure   Office blood pressures are: BP Readings from Last 3 Encounters:  07/17/19 124/80  06/24/19 (!) 157/99  06/20/19 (!) 100/48   BP >140/90 mmHg Patient has failed these meds in the past: none Patient checks BP at home: daily Reports the following readings:  . 186/103, 114/64, 136/56 (before lunch) . 178/92, 175/105, 136/85, 154/84 (bedtime) . Increased lisinopril from 1 to 2 tablets daily 2 days ago: 134/83, 120/90 (bedtime)  Patient is currently uncontrolled on the following medications:   Lisinopril 2.5 mg - 2 tablets daily   We discussed: pt forgot  to increase to 2 tablets daily until 2 days ago, recommend continuing to monitor BP daily   Plan: Continue current medications; Follow up in 1 week; PCP would like patient to bring home blood pressure monitor in to next appointment to assess accuracy.  Medication Management  [facility]   Adherence: pillbox, rarely misses doses  Affordability: no concerns  Vaccines: recommend Tdap, Shingrix   CCM Follow Up: 1 week for BG/BP review (12/25/19 at 1:00 PM - telephone)  Debbora Dus, PharmD Clinical Pharmacist [facility] at [facility] [phone]

## 2019-12-18 NOTE — Telephone Encounter (Signed)
PCP consult regarding CCM visit 12/11/19:  Hypertension: Patient has checked BP daily for the past 2 weeks:   Morning after breakfast: 144/100, 150/95, 164/94, 169/99, 139/75, 149/82 (note: has coffee right before checking)  Evening before dinner: 134/87, 164/80, 166/92, 171/94, 144/92, 188/84, 169/104 (6-7 PM)  She is currently taking: Lisinopril 2.5 mg - 1 tablet daily   Her renal function and potassium are within normal limits.  --> We discussed increasing lisinopril to 2 tablets daily and re-evaluating. ------------------------------- Diabetes: Patient increased metformin 500 mg BID to 1 tablet with breakfast and 2 with evening meal 2 weeks ago and reports the following readings:  FBG Readings: 153, 139, 133, 139, 139, 233 (denies abnormal meal night before), 167, 197, 133, 156  --> We discussed increasing to metformin 500 mg - 2 tablets twice daily as recent readings were above 80-130 mg/dL.   Thank you for allowing me to be a part of Ms. Blust's care. Please let me know your recommendations.  Phil Dopp, PharmD Clinical Pharmacist [facility] at [facility] [phone]

## 2019-12-25 ENCOUNTER — Ambulatory Visit: Payer: Medicare Other

## 2019-12-25 ENCOUNTER — Other Ambulatory Visit: Payer: Self-pay

## 2019-12-25 DIAGNOSIS — E1165 Type 2 diabetes mellitus with hyperglycemia: Secondary | ICD-10-CM

## 2019-12-25 DIAGNOSIS — E785 Hyperlipidemia, unspecified: Secondary | ICD-10-CM

## 2019-12-25 NOTE — Chronic Care Management (AMB) (Signed)
Chronic Care Management Pharmacy  Name: EBONEE STOBER  MRN: [id number] DOB: [birth year]  Chief Complaint/ HPI  [name],  74 y.o., female presents for their Follow-Up CCM visit with the clinical pharmacist via telephone.  PCP : Karie Schwalbe, MD  Their chronic conditions addressed today include: type 2 diabetes, elevated blood pressure  Office Visits: none since CCM visit on 12/18/19  Allergies  Allergen Reactions  . Codeine Sulfate Other (See Comments)    Indigestion and epigastric pain  . Sertraline Hcl Other (See Comments)    REACTION: Worse, ? irritable   Medications: Outpatient Encounter Medications as of 12/25/2019  Medication Sig  . aspirin 325 MG tablet Take 325 mg by mouth every other day. Taking aspirin 81 mg daily  . aspirin EC 81 MG tablet Take 81 mg by mouth daily.  Marland Kitchen atorvastatin (LIPITOR) 20 MG tablet TAKE 1 TABLET BY MOUTH  DAILY  . brimonidine (ALPHAGAN) 0.2 % ophthalmic solution Place 1 drop into both eyes 2 (two) times daily.  . calcium gluconate 500 MG tablet Take 1 tablet by mouth 2 (two) times daily.   . cholecalciferol (VITAMIN D) 1000 UNITS tablet Take 1,000 Units by mouth daily.  . cyclobenzaprine (FLEXERIL) 10 MG tablet TAKE 1 TABLET BY MOUTH 3  TIMES DAILY AS NEEDED FOR  MUSCLE SPASM(S)  . dorzolamide-timolol (COSOPT) 22.3-6.8 MG/ML ophthalmic solution Place 1 drop into both eyes 2 (two) times daily.  . famotidine (PEPCID) 20 MG tablet TAKE 1 TABLET BY MOUTH TWO  TIMES DAILY (Patient taking differently: Take 20 mg by mouth daily. In am)  . folic acid (FOLVITE) 1 MG tablet Take 1 tablet (1 mg total) by mouth daily.  Marland Kitchen glucose blood (ONETOUCH VERIO) test strip Use to check blood sugar once a day. Dx Code E11.9  . lisinopril (ZESTRIL) 2.5 MG tablet TAKE 1 TABLET BY MOUTH  DAILY  . metFORMIN (GLUCOPHAGE) 500 MG tablet TAKE 1 TABLET BY MOUTH  TWICE DAILY  . methotrexate (RHEUMATREX) 2.5 MG tablet TAKE 6 TABLETS BY MOUTH  ONCE A WEEK.   CAUTION:CHEMOTHERAPY.  PROTECT FROM LIGHT. (Patient taking differently: Take 15 mg by mouth once a week. )  . OneTouch Delica Lancets 33G MISC 1 each by Does not apply route daily. Dx Code E11.9  . QUEtiapine (SEROQUEL) 50 MG tablet TAKE 1 TABLET BY MOUTH AT  BEDTIME  . vitamin B-12 (CYANOCOBALAMIN) 1000 MCG tablet Take 1,000 mcg by mouth daily.    . vitamin E 400 UNIT capsule Take 400 Units by mouth daily.    . ziprasidone (GEODON) 80 MG capsule TAKE 1 TO 2 CAPSULES BY  MOUTH DAILY   No facility-administered encounter medications on file as of 12/25/2019.   Current Diagnosis/Assessment: Goals    . Pharmacy Care Plan     CARE PLAN ENTRY  Current Barriers:  . Chronic Disease Management support, education, and care coordination needs related to TIA, GERD, type 2 diabetes, osteopenia, RA, hyperlipidemia, bipolar disorder, chronic back pain, glaucoma  Pharmacist Clinical Goal(s):  . Remain up to date on vaccinations. Recommend tetanus vaccine (Tdap) and shingles vaccine (Shingrix) from local pharmacy at [facility].   . Maintain blood pressure within goal of less than 140/90 mmHg. Recommend checking blood pressure prior to breakfast, morning medications, and coffee. May also check prior to evening meal. Always rest for 5 minutes prior to checking blood pressure. Repeat check after 2-3 minutes if blood pressure is above 140/90.  . Manage dry mouth. Recommend trying  an over the counter Biotene dry mouth rinse or spray for dry mouth.  . Improve blood glucose control with A1c goal of less than 7%. Monitor blood glucose daily before breakfast. Check again 1-2 hours after largest meal of the day.   Interventions: . Comprehensive medication review performed.  Patient Self Care Activities:  . Self administers medications as prescribed . Self-monitors blood pressure and blood glucose  Please see past updates related to this goal by clicking on the "Past Updates" button in the selected goal        Diabetes   CMP Latest Ref Rng & Units 01/14/2019 11/01/2017 01/17/2016  Glucose 70 - 99 mg/dL 585(I) 778(E) 423(N)  BUN 6 - 23 mg/dL 17 23 15   Creatinine 0.40 - 1.20 mg/dL 3.61 4.43  Sodium 135 - 145 mEq/L 139 140 142  Potassium 3.5 - 5.1 mEq/L 4.8 4.2 4.6  Chloride 96 - 112 mEq/L 106 103 101  CO2 19 - 32 mEq/L 27 27 23   Calcium 8.4 - 10.5 mg/dL 9.3 9.9 9.8  Total Protein 6.0 - 8.3 g/dL 6.8 - 7.6  Total Bilirubin 0.2 - 1.2 mg/dL 0.4 - 0.3  Alkaline Phos 39 - 117 U/L 85 - 93  AST 0 - 37 U/L 16 - 14  ALT 0 - 35 U/L 14 - 10   Recent Relevant Labs: Lab Results  Component Value Date/Time   HGBA1C 8.4 (A) 07/17/2019 03:09 PM   HGBA1C 7.2 (H) 01/14/2019 02:26 PM   HGBA1C 7.0 (H) 11/01/2017 04:48 PM   MICROALBUR 1.7 04/30/2015 12:43 PM   MICROALBUR 2.1 (H) 06/09/2013 12:54 PM    Checking BG: Daily  Reports the following readings:  This week: Fasting: 207, 130, 147, 166, 146, 142, 130 Last week:   After breakfast: 166, 190   Before lunch: 140  1 hour after lunch: 126, 170   Denies s/s hypoglycemia   Patient has failed these meds in past: none  Patient is currently uncontrolled on the following medications:   Metformin 500 mg - 2 tablets with breakfast and 2 tablets with evening meal   Last diabetic eye exam:  Lab Results  Component Value Date/Time   HMDIABEYEEXA No Retinopathy 05/05/2019 12:00 AM    Last diabetic foot exam:  Lab Results  Component Value Date/Time   HMDIABFOOTEX done 11/01/2017 12:00 AM    Recent changes: per PCP consult pt increased metformin to 2 tablets AM 2 tablets PM on 12/11/19  We discussed: denies adverse effects, post-prandial readings are near goal of < 180, fasting readings slightly elevated, no s/s hypoglycemia, recommend continuing current medications and checking fasting BG daily, continue to watch diet and see if correlation with elevated fasting readings  Diet:   Snack: halo orange, carrots  Supper: meat and vegetables    Lunch: salad  Breakfast: sausage, bacon or ham with egg or biscuit, slice of honeydew melon  Drinks: diet lemon tea daily, no sodas or juice, occasional milk, Slimfast   Exercise: unable to walk well due to balance, pain in back; planning PT soon; started lidocaine patches once daily for pain (OTC)   Plan: Continue current medications; Continue to adhere to heart healthy diet.   No diagnosis: high blood pressure   Office blood pressures are: BP Readings from Last 3 Encounters:  07/17/19 124/80  06/24/19 (!) 157/99  06/20/19 (!) 100/48   BP >140/90 mmHg Patient has failed these meds in the past: none Patient checks BP at home: daily Reports the following readings: (  last week)134/83, 120/90 (this week) 157/100, 171/105, 137/88, 159/92, 121/60, 146/84, 152/93 --> all before supper/bedtime   Patient is currently uncontrolled on the following medications:   Lisinopril 2.5 mg - 2 tablets daily (PM)   We discussed: patient increased lisinopril 2.5 mg to 2 tablets qhs about 10 days ago, plan to bring BP monitor to next appt with PCP in 3 weeks, recommend continuing to monitor BP daily   Plan: Continue current medications; Follow up with PCP in 3 weeks to assess BP monitor accuracy.   Medication Management  Pharmacy[facility]Benefits: [facility]   Adherence: pillbox, rarely misses doses  Affordability: no concerns  Vaccines: recommend Tdap (interested), Shingrix (not interested)   CCM Follow Up: 02/05/20 at 1:00 PM (telephone)  Debbora Dus, PharmD Clinical Pharmacist [facility] at [address] [phone]

## 2020-01-15 ENCOUNTER — Ambulatory Visit (INDEPENDENT_AMBULATORY_CARE_PROVIDER_SITE_OTHER): Payer: Medicare Other | Admitting: Internal Medicine

## 2020-01-15 ENCOUNTER — Other Ambulatory Visit: Payer: Self-pay

## 2020-01-15 ENCOUNTER — Encounter: Payer: Self-pay | Admitting: Internal Medicine

## 2020-01-15 VITALS — BP 140/88 | HR 92 | Temp 97.1°F | Ht 64.0 in | Wt 132.0 lb

## 2020-01-15 DIAGNOSIS — G8929 Other chronic pain: Secondary | ICD-10-CM

## 2020-01-15 DIAGNOSIS — E1159 Type 2 diabetes mellitus with other circulatory complications: Secondary | ICD-10-CM | POA: Diagnosis not present

## 2020-01-15 DIAGNOSIS — I10 Essential (primary) hypertension: Secondary | ICD-10-CM

## 2020-01-15 DIAGNOSIS — E1165 Type 2 diabetes mellitus with hyperglycemia: Secondary | ICD-10-CM | POA: Diagnosis not present

## 2020-01-15 DIAGNOSIS — M5442 Lumbago with sciatica, left side: Secondary | ICD-10-CM | POA: Diagnosis not present

## 2020-01-15 LAB — POCT GLYCOSYLATED HEMOGLOBIN (HGB A1C): Hemoglobin A1C: 6.9 % — AB (ref 4.0–5.6)

## 2020-01-15 LAB — HM DIABETES FOOT EXAM

## 2020-01-15 NOTE — Assessment & Plan Note (Signed)
Lab Results  Component Value Date   HGBA1C 6.9 (A) 01/15/2020   Control much better with help of pharmacist Will continue current dose of metformin Has HTN

## 2020-01-15 NOTE — Progress Notes (Signed)
Subjective:    Patient ID: Diana Roberson, female    DOB: Jul 08, 1946, 74 y.o.   MRN: [id number]  HPI Here with husband for follow up of diabetes. Also having back pain This visit occurred during the SARS-CoV-2 public health emergency.  Safety protocols were in place, including screening questions prior to the visit, additional usage of staff PPE, and extensive cleaning of exam room while observing appropriate contact time as indicated for disinfecting solutions.   Has improved with diabetes with the help of pharmacist Metformin dose increased 1000 bid (2 of the 500mg ) Checking daily--- 100-147. Most under 130 No foot pain or numbness  Checks BP at home also 135/88-162/100 No chest pain or SOB  Chronic back pain Uses lidocaine patches, inversion table, flexeril Not as helpful  Pain radiates down to left leg Wonders about referral to a spine center--they know someone that did well with injections Pain is fairly constant---sitting in recliner helps Worse with standing. Walking helps No weakness in left leg  Current Outpatient Medications on File Prior to Visit  Medication Sig Dispense Refill  . aspirin 325 MG tablet Take 325 mg by mouth every other day. Taking aspirin 81 mg daily    . aspirin EC 81 MG tablet Take 81 mg by mouth daily.    Marland Kitchen atorvastatin (LIPITOR) 20 MG tablet TAKE 1 TABLET BY MOUTH  DAILY 90 tablet 3  . brimonidine (ALPHAGAN) 0.2 % ophthalmic solution Place 1 drop into both eyes 2 (two) times daily.    . calcium gluconate 500 MG tablet Take 1 tablet by mouth 2 (two) times daily.     . cholecalciferol (VITAMIN D) 1000 UNITS tablet Take 1,000 Units by mouth daily.    . cyclobenzaprine (FLEXERIL) 10 MG tablet TAKE 1 TABLET BY MOUTH 3  TIMES DAILY AS NEEDED FOR  MUSCLE SPASM(S) 180 tablet 0  . dorzolamide-timolol (COSOPT) 22.3-6.8 MG/ML ophthalmic solution Place 1 drop into both eyes 2 (two) times daily.    . famotidine (PEPCID) 20 MG tablet TAKE 1 TABLET BY MOUTH TWO   TIMES DAILY (Patient taking differently: Take 20 mg by mouth daily. In am) 258 tablet 3  . folic acid (FOLVITE) 1 MG tablet Take 1 tablet (1 mg total) by mouth daily. 90 tablet 3  . glucose blood (ONETOUCH VERIO) test strip Use to check blood sugar once a day. Dx Code E11.9 100 each 3  . lisinopril (ZESTRIL) 2.5 MG tablet TAKE 1 TABLET BY MOUTH  DAILY (Patient taking differently: 5 mg. ) 90 tablet 3  . metFORMIN (GLUCOPHAGE) 500 MG tablet TAKE 1 TABLET BY MOUTH  TWICE DAILY (Patient taking differently: 1,000 mg. ) 180 tablet 3  . methotrexate (RHEUMATREX) 2.5 MG tablet TAKE 6 TABLETS BY MOUTH  ONCE A WEEK.  CAUTION:CHEMOTHERAPY.  PROTECT FROM LIGHT. (Patient taking differently: Take 15 mg by mouth once a week. ) 78 tablet 3  . OneTouch Delica Lancets 52D MISC 1 each by Does not apply route daily. Dx Code E11.9 100 each 3  . QUEtiapine (SEROQUEL) 50 MG tablet TAKE 1 TABLET BY MOUTH AT  BEDTIME 90 tablet 3  . vitamin B-12 (CYANOCOBALAMIN) 1000 MCG tablet Take 1,000 mcg by mouth daily.      . vitamin E 400 UNIT capsule Take 400 Units by mouth daily.      . ziprasidone (GEODON) 80 MG capsule TAKE 1 TO 2 CAPSULES BY  MOUTH DAILY 180 capsule 3   No current facility-administered medications on file prior to  visit.    Allergies  Allergen Reactions  . Codeine Sulfate Other (See Comments)    Indigestion and epigastric pain  . Sertraline Hcl Other (See Comments)    REACTION: Worse, ? irritable    Past Medical History:  Diagnosis Date  . Allergy   . Bipolar 1 disorder (HCC)   . Chronic back pain   . Diabetes mellitus   . Difficult intubation   . Diverticulitis   . GERD (gastroesophageal reflux disease)   . Glaucoma   . Hyperlipidemia   . Hypertension   . Mental health disorder    admitted x3  . Osteopenia   . Rheumatoid arthritis [facility])     Past Surgical History:  Procedure Laterality Date  . APPENDECTOMY    . BACK SURGERY  1988  . BREAST CYST ASPIRATION     neg  . CHOLECYSTECTOMY     . COLONOSCOPY    . COLONOSCOPY WITH PROPOFOL N/A 11/27/2017   Procedure: COLONOSCOPY WITH PROPOFOL;  Surgeon: Christena Deem, MD;  Location: [facility] ENDOSCOPY;  Service: Endoscopy;  Laterality: N/A;  . HAND SURGERY    . ORIF ELBOW FRACTURE Right 06/24/2019   Procedure: OPEN REDUCTION INTERNAL FIXATION (ORIF) ELBOW/OLECRANON FRACTURE;  Surgeon: Christena Flake, MD;  Location: [facility] ORS;  Service: Orthopedics;  Laterality: Right;    Family History  Problem Relation Age of Onset  . Diabetes Mother   . Hypertension Mother   . Stroke Sister   . Hashimoto's thyroiditis Daughter   . Stroke Father   . Stroke Sister   . Cancer Neg Hx   . Breast cancer Neg Hx     Social History   Socioeconomic History  . Marital status: Married    Spouse name: Not on file  . Number of children: 2  . Years of education: Not on file  . Highest education level: Not on file  Occupational History  . Occupation: formerly Designer, jewellery for [employer]  . Smoking status: Never Smoker  . Smokeless tobacco: Never Used  Substance and Sexual Activity  . Alcohol use: No  . Drug use: No  . Sexual activity: Not on file  Other Topics Concern  . Not on file  Social History Narrative   Has living will   Husband is health care POA. Alternate is daughter [name]   Would accept resuscitation attempts   Would not want feeding tube if cognitively unaware   Social Determinants of Health   Financial Resource Strain:   . Difficulty of Paying Living Expenses:   Food Insecurity:   . Worried About Programme researcher, broadcasting/film/video in the Last Year:   . Barista in the Last Year:   Transportation Needs:   . Freight forwarder (Medical):   Marland Kitchen Lack of Transportation (Non-Medical):   Physical Activity:   . Days of Exercise per Week:   . Minutes of Exercise per Session:   Stress:   . Feeling of Stress :   Social Connections:   . Frequency of Communication with Friends and Family:   . Frequency  of Social Gatherings with Friends and Family:   . Attends Religious Services:   . Active Member of Clubs or Organizations:   . Attends Banker Meetings:   Marland Kitchen Marital Status:   Intimate Partner Violence:   . Fear of Current or Ex-Partner:   . Emotionally Abused:   Marland Kitchen Physically Abused:   . Sexually Abused:    Review of  Systems Appetite is okay Weight down slightly    Objective:   Physical Exam  Constitutional: She appears well-developed. No distress.  Neck: No thyromegaly present.  Cardiovascular: Normal rate, regular rhythm, normal heart sounds and intact distal pulses.  No murmur heard. Respiratory: Effort normal and breath sounds normal. No respiratory distress. She has no wheezes. She has no rales.  Musculoskeletal:     Comments: Slight lumbar spine tenderness---but mainly posterior left hip is tender SLR negative Normal ROM in hips  Lymphadenopathy:    She has no cervical adenopathy.  Neurological:  Leg strength is symmetric---hip flexors are weaker than extenders Gait is normal  Normal sensation in feet  Skin:  Plantar callous on left No foot lesions otherwise           Assessment & Plan:

## 2020-01-15 NOTE — Assessment & Plan Note (Signed)
Worse now With left sciatica Will refer to physiatry

## 2020-01-15 NOTE — Assessment & Plan Note (Signed)
BP Readings from Last 3 Encounters:  01/15/20 140/88  07/17/19 124/80  06/24/19 (!) 157/99   On low dose lisinopril

## 2020-01-16 NOTE — Addendum Note (Signed)
Addended by: Tillman Abide I on: 01/16/2020 12:30 PM   Modules accepted: Orders

## 2020-01-21 ENCOUNTER — Telehealth: Payer: Self-pay

## 2020-01-21 MED ORDER — LISINOPRIL 5 MG PO TABS: 5.0000 mg | ORAL_TABLET | Freq: Every day | ORAL | 3 refills | Status: DC

## 2020-01-21 NOTE — Telephone Encounter (Signed)
Yes we tried but it would not cooperate with [place] that day. I had gotten a good reading at her OV. I will send the lisinopril rx to [facility]

## 2020-01-21 NOTE — Telephone Encounter (Signed)
Pt has been taking lisinopril 2.5 mg - 2 tablets daily per consult with PCP for the past month. She is about out of medication and would like a refill. Could we send in a new rx for lisinopril 5 mg - once daily to [facility]?  I am continuing to follow her BP readings which remain high (140s-160s/80-100). She brought her BP monitor in for the last office visit to check accuracy but is unsure whether or not it is accurate. Do you all recall checking accuracy?  Thanks!  Phil Dopp, PharmD Clinical Pharmacist [facility] at [facility] [phone]

## 2020-01-23 ENCOUNTER — Other Ambulatory Visit: Payer: Self-pay | Admitting: Physical Medicine & Rehabilitation

## 2020-01-23 DIAGNOSIS — M5442 Lumbago with sciatica, left side: Secondary | ICD-10-CM | POA: Diagnosis not present

## 2020-01-23 DIAGNOSIS — G8929 Other chronic pain: Secondary | ICD-10-CM | POA: Diagnosis not present

## 2020-01-23 DIAGNOSIS — R000 Tachycardia, unspecified: Secondary | ICD-10-CM | POA: Diagnosis not present

## 2020-01-29 ENCOUNTER — Telehealth: Payer: Self-pay | Admitting: Internal Medicine

## 2020-01-29 DIAGNOSIS — R269 Unspecified abnormalities of gait and mobility: Secondary | ICD-10-CM

## 2020-01-29 NOTE — Telephone Encounter (Signed)
New order placed

## 2020-01-29 NOTE — Telephone Encounter (Signed)
Patient called today in regards to a referral for Balance She stated that where ever she was referred before called her and said that they unforuntley can not schedule her because they do not see patients for that??  Patient did not go into detail on what "that" is  She stated they gave her a phone number and told her to give it to her pcp to have the referral sent there.    [facility] at [facility] [phone]

## 2020-01-29 NOTE — Telephone Encounter (Signed)
Per pt, she was advised [facility] doesn't do gait disturbance PT.  They recommended pt go to [facility].  Will need new order.  Pt aware will be a few days for new order.

## 2020-02-05 ENCOUNTER — Ambulatory Visit: Payer: Medicare Other

## 2020-02-05 ENCOUNTER — Ambulatory Visit
Admission: RE | Admit: 2020-02-05 | Discharge: 2020-02-05 | Disposition: A | Discharge status: Home / Self Care | Payer: Medicare Other | Source: Ambulatory Visit | Attending: Physical Medicine & Rehabilitation | Admitting: Physical Medicine & Rehabilitation

## 2020-02-05 ENCOUNTER — Other Ambulatory Visit: Payer: Self-pay

## 2020-02-05 DIAGNOSIS — E1159 Type 2 diabetes mellitus with other circulatory complications: Secondary | ICD-10-CM

## 2020-02-05 DIAGNOSIS — G8929 Other chronic pain: Secondary | ICD-10-CM | POA: Insufficient documentation

## 2020-02-05 DIAGNOSIS — I10 Essential (primary) hypertension: Secondary | ICD-10-CM

## 2020-02-05 DIAGNOSIS — M48061 Spinal stenosis, lumbar region without neurogenic claudication: Secondary | ICD-10-CM | POA: Diagnosis not present

## 2020-02-05 DIAGNOSIS — M5442 Lumbago with sciatica, left side: Secondary | ICD-10-CM | POA: Diagnosis not present

## 2020-02-05 MED ORDER — GADOBUTROL 1 MMOL/ML IV SOLN
6.0000 mL | Freq: Once | INTRAVENOUS | Status: AC | PRN
  Administered 2020-02-05: 6 mL via INTRAVENOUS

## 2020-02-05 NOTE — Chronic Care Management (AMB) (Signed)
Chronic Care Management Pharmacy  Name: Diana Roberson  MRN: [id number] DOB: 03/01/1946  Chief Complaint/ HPI  Diana Roberson,  74 y.o., female presents for their Follow-Up CCM visit with the clinical pharmacist via telephone.  PCP : Karie Schwalbe, MD  Their chronic conditions addressed today include: type 2 diabetes, elevated blood pressure  Allergies  Allergen Reactions  . Codeine Sulfate Other (See Comments)    Indigestion and epigastric pain  . Sertraline Hcl Other (See Comments)    REACTION: Worse, ? irritable   Medications: Outpatient Encounter Medications as of 02/05/2020  Medication Sig  . aspirin EC 81 MG tablet Take 81 mg by mouth daily.  Marland Kitchen atorvastatin (LIPITOR) 20 MG tablet TAKE 1 TABLET BY MOUTH  DAILY  . brimonidine (ALPHAGAN) 0.2 % ophthalmic solution Place 1 drop into both eyes 2 (two) times daily.  . calcium gluconate 500 MG tablet Take 1 tablet by mouth 2 (two) times daily.   . cholecalciferol (VITAMIN D) 1000 UNITS tablet Take 1,000 Units by mouth daily.  . cyclobenzaprine (FLEXERIL) 10 MG tablet TAKE 1 TABLET BY MOUTH 3  TIMES DAILY AS NEEDED FOR  MUSCLE SPASM(S)  . dorzolamide-timolol (COSOPT) 22.3-6.8 MG/ML ophthalmic solution Place 1 drop into both eyes 2 (two) times daily.  . famotidine (PEPCID) 20 MG tablet TAKE 1 TABLET BY MOUTH TWO  TIMES DAILY (Patient taking differently: Take 20 mg by mouth daily. In am)  . folic acid (FOLVITE) 1 MG tablet Take 1 tablet (1 mg total) by mouth daily.  Marland Kitchen glucose blood (ONETOUCH VERIO) test strip Use to check blood sugar once a day. Dx Code E11.9  . lisinopril (ZESTRIL) 5 MG tablet Take 1 tablet (5 mg total) by mouth daily.  . metFORMIN (GLUCOPHAGE) 500 MG tablet TAKE 1 TABLET BY MOUTH  TWICE DAILY (Patient taking differently: 1,000 mg. )  . methotrexate (RHEUMATREX) 2.5 MG tablet TAKE 6 TABLETS BY MOUTH  ONCE A WEEK.  CAUTION:CHEMOTHERAPY.  PROTECT FROM LIGHT. (Patient taking differently: Take 15 mg by mouth once  a week. )  . OneTouch Delica Lancets 33G MISC 1 each by Does not apply route daily. Dx Code E11.9  . QUEtiapine (SEROQUEL) 50 MG tablet TAKE 1 TABLET BY MOUTH AT  BEDTIME  . vitamin B-12 (CYANOCOBALAMIN) 1000 MCG tablet Take 1,000 mcg by mouth daily.    . vitamin E 400 UNIT capsule Take 400 Units by mouth daily.    . ziprasidone (GEODON) 80 MG capsule TAKE 1 TO 2 CAPSULES BY  MOUTH DAILY   No facility-administered encounter medications on file as of 02/05/2020.   Current Diagnosis/Assessment: Goals    . Pharmacy Care Plan     CARE PLAN ENTRY  Current Barriers:  . Chronic Disease Management support, education, and care coordination needs related to elevated blood pressure, type 2 diabetes, hyperlipidemia  Pharmacist Clinical Goal(s):  Marland Kitchen Vaccinations: Remain up to date on vaccinations. Recommend tetanus vaccine (Tdap) from local pharmacy at soonest convenience.   . Hypertension: Maintain blood pressure within goal of less than 140/90 mmHg. Recommend checking blood pressure prior to breakfast, morning medications, and coffee. May also check prior to evening meal. Always rest for 5 minutes prior to checking blood pressure. Repeat check after 2-3 minutes if blood pressure is above 140/90.  . Manage adverse effects: Manage dry mouth. Recommend trying an over the counter Biotene dry mouth rinse or spray for dry mouth.  . Diabetes: Improve blood glucose control with A1c goal of less than  7%. Monitor blood glucose daily before breakfast. Check again 1-2 hours after largest meal of the day.   Interventions: . Comprehensive medication review performed.  Patient Self Care Activities:  . Self administers medications as prescribed . Self-monitors blood pressure and blood glucose  Please see past updates related to this goal by clicking on the "Past Updates" button in the selected goal       Diabetes   CMP Latest Ref Rng & Units 01/14/2019 11/01/2017 01/17/2016  Glucose 70 - 99 mg/dL 161(W) 960(A)  540(J)  BUN 6 - 23 mg/dL 17 23 15   Creatinine 0.40 - 1.20 mg/dL 8.11 9.14  Sodium 135 - 145 mEq/L 139 140 142  Potassium 3.5 - 5.1 mEq/L 4.8 4.2 4.6  Chloride 96 - 112 mEq/L 106 103 101  CO2 19 - 32 mEq/L 27 27 23   Calcium 8.4 - 10.5 mg/dL 9.3 9.9 9.8  Total Protein 6.0 - 8.3 g/dL 6.8 - 7.6  Total Bilirubin 0.2 - 1.2 mg/dL 0.4 - 0.3  Alkaline Phos 39 - 117 U/L 85 - 93  AST 0 - 37 U/L 16 - 14  ALT 0 - 35 U/L 14 - 10   Recent Relevant Labs: Lab Results  Component Value Date/Time   HGBA1C 6.9 (A) 01/15/2020 02:35 PM   HGBA1C 8.4 (A) 07/17/2019 03:09 PM   HGBA1C 7.2 (H) 01/14/2019 02:26 PM   HGBA1C 7.0 (H) 11/01/2017 04:48 PM   MICROALBUR 1.7 04/30/2015 12:43 PM   MICROALBUR 2.1 (H) 06/09/2013 12:54 PM    Checking BG: several days per week (fasting only)  Reports the following readings: average from glucometer = 150 mg/dL Denies s/s hypoglycemia   A1c goal < 7% Patient has failed these meds in past: none  Patient is currently controlled on the following medications:   Metformin 500 mg - 2 tablets with breakfast and 2 tablets with evening meal   Last diabetic eye exam:  Lab Results  Component Value Date/Time   HMDIABEYEEXA No Retinopathy 05/05/2019 12:00 AM    Last diabetic foot exam:  Lab Results  Component Value Date/Time   HMDIABFOOTEX done 01/15/2020 12:00 AM    Recent changes: per PCP consult pt increased metformin to 2 tablets AM 2 tablets PM on 12/11/19  We discussed: Congratulated patient on improved A1c, now at goal; recommend continuing current medications   Diet: (denies dietary changes since last visit)  Snack: halo orange, carrots  Supper: meat and vegetables   Lunch: salad  Breakfast: sausage, bacon or ham with egg or biscuit, slice of honeydew melon  Drinks: diet lemon tea daily, no sodas or juice, occasional milk, Slimfast   Exercise: unable to walk well due to balance and pain in back, has been referred to PT   Plan: Continue current  medications; Continue to adhere to heart healthy diet.   Hypertension   BMP Latest Ref Rng & Units 01/14/2019 11/01/2017 01/17/2016  Glucose 70 - 99 mg/dL 11/03/2017) 03/18/2016) 956(O)  BUN 6 - 23 mg/dL 17 23 15   Creatinine 0.40 - 1.20 mg/dL 130(Q 657(Q  BUN/Creat Ratio 12 - 28 - - 17  Sodium 135 - 145 mEq/L 139 140 142  Potassium 3.5 - 5.1 mEq/L 4.8 4.2 4.6  Chloride 96 - 112 mEq/L 106 103 101  CO2 19 - 32 mEq/L 27 27 23   Calcium 8.4 - 10.5 mg/dL 9.3 9.9 9.8   Office blood pressures are: BP Readings from Last 3 Encounters:  01/15/20 140/88  07/17/19 124/80  06/24/19 )  157/99   BP >140/90 mmHg Patient has failed these meds in the past: none Patient checks BP at home: daily, bought a new BP cuff and compared to her old monitor and the readings were very similar  Reports the following readings: --> last 3 days   195/103  166/79  176/93  Patient is currently uncontrolled on the following medications:   Lisinopril 2.5 mg - 2 tablets daily (PM) --> Patient realized she was accidentally taking lisinopril 2.5 mg daily the past 2 weeks instead of 5 mg daily   We discussed: BP monitor seems to be accurate at home, patient will resume 5 mg daily and follow up call in 1 week to consider dose adjustments.   Plan: Resume lisinopril 5 mg daily, f/u in 1 week.   Medication Management  Ph[facility]efits: [facility]   Adherence: pillbox, rarely misses doses  Affordability: no concerns  Vaccines: recommend Tdap (interested), Shingrix (not interested)   CCM Follow Up: 02/12/20 at 12:15 PM (telephone)  Debbora Dus, PharmD Clinical Pharmacist [facility] at [facility] [phone]

## 2020-02-11 DIAGNOSIS — M48061 Spinal stenosis, lumbar region without neurogenic claudication: Secondary | ICD-10-CM | POA: Diagnosis not present

## 2020-02-11 DIAGNOSIS — G8929 Other chronic pain: Secondary | ICD-10-CM | POA: Diagnosis not present

## 2020-02-11 DIAGNOSIS — M5442 Lumbago with sciatica, left side: Secondary | ICD-10-CM | POA: Diagnosis not present

## 2020-02-12 ENCOUNTER — Telehealth: Payer: Self-pay

## 2020-02-12 ENCOUNTER — Other Ambulatory Visit: Payer: Self-pay

## 2020-02-12 ENCOUNTER — Ambulatory Visit: Payer: Medicare Other

## 2020-02-12 DIAGNOSIS — I10 Essential (primary) hypertension: Secondary | ICD-10-CM

## 2020-02-12 DIAGNOSIS — E1159 Type 2 diabetes mellitus with other circulatory complications: Secondary | ICD-10-CM

## 2020-02-12 MED ORDER — LISINOPRIL 10 MG PO TABS
10.0000 mg | ORAL_TABLET | Freq: Every day | ORAL | 3 refills | Status: DC
Start: 2020-02-12 — End: 2020-11-08

## 2020-02-12 NOTE — Telephone Encounter (Signed)
PCP consult regarding CCM visit 02/12/20:  Patient concerned about blood pressure remaining elevated. She is on lisinopril 5 mg daily (increased from 2.5 mg daily 12/18/19). Last BMP completed 01/14/19. She recently purchased a new BP monitor and compared to previous monitor, both provide similar readings. She reported the following readings from the past 7 days (taken in the morning before breakfast or medication):   162/81, 96  145/82, 83  161/89, 88  147/86, 84  149/86, 85  166/85, 99  168/92, 98  Recommendation: We discussed increasing lisinopril to 10 mg daily and repeat BMP within 2-4 weeks. Will call patient with changes pending PCP consult/approval.   Phil Dopp, PharmD Clinical Pharmacist [facility] [phone]

## 2020-02-12 NOTE — Chronic Care Management (AMB) (Signed)
Chronic Care Management Pharmacy  Name: Diana Roberson  MRN: [id number] DOB: 09-04-1946  Chief Complaint/ HPI  Diana Roberson,  74 y.o., female presents for their Follow-Up CCM visit with the clinical pharmacist via telephone.  PCP : Karie Schwalbe, MD  Their chronic conditions addressed today include: type 2 diabetes, elevated blood pressure  Allergies  Allergen Reactions   Codeine Sulfate Other (See Comments)    Indigestion and epigastric pain   Sertraline Hcl Other (See Comments)    REACTION: Worse, ? irritable   Medications: Outpatient Encounter Medications as of 02/12/2020  Medication Sig   aspirin EC 81 MG tablet Take 81 mg by mouth daily.   atorvastatin (LIPITOR) 20 MG tablet TAKE 1 TABLET BY MOUTH  DAILY   brimonidine (ALPHAGAN) 0.2 % ophthalmic solution Place 1 drop into both eyes 2 (two) times daily.   calcium gluconate 500 MG tablet Take 1 tablet by mouth 2 (two) times daily.    cholecalciferol (VITAMIN D) 1000 UNITS tablet Take 1,000 Units by mouth daily.   cyclobenzaprine (FLEXERIL) 10 MG tablet TAKE 1 TABLET BY MOUTH 3  TIMES DAILY AS NEEDED FOR  MUSCLE SPASM(S)   dorzolamide-timolol (COSOPT) 22.3-6.8 MG/ML ophthalmic solution Place 1 drop into both eyes 2 (two) times daily.   famotidine (PEPCID) 20 MG tablet TAKE 1 TABLET BY MOUTH TWO  TIMES DAILY (Patient taking differently: Take 20 mg by mouth daily. In am)   folic acid (FOLVITE) 1 MG tablet Take 1 tablet (1 mg total) by mouth daily.   glucose blood (ONETOUCH VERIO) test strip Use to check blood sugar once a day. Dx Code E11.9   lisinopril (ZESTRIL) 10 MG tablet Take 1 tablet (10 mg total) by mouth daily.   metFORMIN (GLUCOPHAGE) 500 MG tablet TAKE 1 TABLET BY MOUTH  TWICE DAILY (Patient taking differently: 1,000 mg. )   methotrexate (RHEUMATREX) 2.5 MG tablet TAKE 6 TABLETS BY MOUTH  ONCE A WEEK.  CAUTION:CHEMOTHERAPY.  PROTECT FROM LIGHT. (Patient taking differently: Take 15 mg by mouth once  a week. )   OneTouch Delica Lancets 33G MISC 1 each by Does not apply route daily. Dx Code E11.9   QUEtiapine (SEROQUEL) 50 MG tablet TAKE 1 TABLET BY MOUTH AT  BEDTIME   vitamin B-12 (CYANOCOBALAMIN) 1000 MCG tablet Take 1,000 mcg by mouth daily.     vitamin E 400 UNIT capsule Take 400 Units by mouth daily.     ziprasidone (GEODON) 80 MG capsule TAKE 1 TO 2 CAPSULES BY  MOUTH DAILY   [DISCONTINUED] lisinopril (ZESTRIL) 5 MG tablet Take 1 tablet (5 mg total) by mouth daily.   No facility-administered encounter medications on file as of 02/12/2020.   Current Diagnosis/Assessment: Goals     Pharmacy Care Plan     CARE PLAN ENTRY  Current Barriers:   Chronic Disease Management support, education, and care coordination needs related to hypertension, type 2 diabetes  Pharmacist Clinical Goal(s):   Vaccinations: Remain up to date on vaccinations.   Hypertension: Improve blood pressure within goal of less than 140/90 mmHg.  Manage adverse effects: Manage dry mouth. Recommend trying an over the counter Biotene dry mouth rinse or spray for dry mouth.   Diabetes: Improve blood glucose control with A1c goal of less than 7%.   Interventions:  Comprehensive medication review performed.  Consult with PCP for lisinopril dose increase and lab monitoring.  Patient Self Care Activities:   Check blood pressure daily before breakfast, morning medications, and coffee.  Always rest for 5 minutes prior to checking blood pressure. Repeat check after 2-3 minutes if blood pressure is above 140/90.   Check blood glucose once daily, document, and provide at future visits. Vary time of day (before breakfast, 1-2 hours after largest meal of the day, bedtime).   Check with local pharmacy for tetanus vaccine (Tdap).  Increase lisinopril to 10 mg daily and schedule lab appointment for 2-4 weeks after dose increase.  Please see past updates related to this goal by clicking on the "Past Updates" button  in the selected goal       Diabetes   CMP Latest Ref Rng & Units 01/14/2019 11/01/2017 01/17/2016  Glucose 70 - 99 mg/dL 125(H) 161(H) 117(H)  BUN 6 - 23 mg/dL 17 23 15   Creatinine 0.40 - 1.20 mg/dL 0.80 0.84 0.88  Sodium 135 - 145 mEq/L 139 140 142  Potassium 3.5 - 5.1 mEq/L 4.8 4.2 4.6  Chloride 96 - 112 mEq/L 106 103 101  CO2 19 - 32 mEq/L 27 27 23   Calcium 8.4 - 10.5 mg/dL 9.3 9.9 9.8  Total Protein 6.0 - 8.3 g/dL 6.8 - 7.6  Total Bilirubin 0.2 - 1.2 mg/dL 0.4 - 0.3  Alkaline Phos 39 - 117 U/L 85 - 93  AST 0 - 37 U/L 16 - 14  ALT 0 - 35 U/L 14 - 10   Recent Relevant Labs: Lab Results  Component Value Date/Time   HGBA1C 6.9 (A) 01/15/2020 02:35 PM   HGBA1C 8.4 (A) 07/17/2019 03:09 PM   HGBA1C 7.2 (H) 01/14/2019 02:26 PM   HGBA1C 7.0 (H) 11/01/2017 04:48 PM   MICROALBUR 1.7 04/30/2015 12:43 PM   MICROALBUR 2.1 (H) 06/09/2013 12:54 PM    Checking BG: several days per week Reports the following fasting readings from the past week: 127, 123, 160, 146  Denies s/s hypoglycemia   A1c goal < 7% Patient has failed these meds in past: none  Patient is currently controlled on the following medications:   Metformin 500 mg - 2 tablets with breakfast and 2 tablets with evening meal   Last diabetic eye exam:  Lab Results  Component Value Date/Time   HMDIABEYEEXA No Retinopathy 05/05/2019 12:00 AM    Last diabetic foot exam:  Lab Results  Component Value Date/Time   HMDIABFOOTEX done 01/15/2020 12:00 AM    Recent changes: per PCP consult pt increased metformin to 2 tablets AM 2 tablets PM on 12/11/19 We discussed: fasting readings fluctuate but remain near goal, A1c within goal; will continue to monitor daily  Diet: (denies dietary changes since last visit)  Snack: halo orange, carrots  Supper: meat and vegetables   Lunch: salad  Breakfast: sausage, bacon or ham with egg or biscuit, slice of honeydew melon  Drinks: diet lemon tea daily, no sodas or juice, occasional milk,  Slimfast   Exercise: unable to walk well due to balance and pain in back, has been referred to PT   Plan: Continue current medications; Continue to adhere to heart healthy diet.   Hypertension   BMP Latest Ref Rng & Units 01/14/2019 11/01/2017 01/17/2016  Glucose 70 - 99 mg/dL 125(H) 161(H) 117(H)  BUN 6 - 23 mg/dL 17 23 15   Creatinine 0.40 - 1.20 mg/dL 0.80 0.84 0.88  BUN/Creat Ratio 12 - 28 - - 17  Sodium 135 - 145 mEq/L 139 140 142  Potassium 3.5 - 5.1 mEq/L 4.8 4.2 4.6  Chloride 96 - 112 mEq/L 106 103 101  CO2 19 -  32 mEq/L 27 27 23   Calcium 8.4 - 10.5 mg/dL 9.3 9.9 9.8   Office blood pressures are: BP Readings from Last 3 Encounters:  01/15/20 140/88  07/17/19 124/80  06/24/19 (!) 157/99   BP >140/90 mmHg Patient has failed these meds in the past: none Patient checks BP at home: daily Reports the following readings: --> last 7 days (pt resumed lisinopril 5 mg the morning of 02/06/20 -  accidentally reduced to 2.5 mg for the 2 weeks prior to 02/06/20)  162/81, 96 - 02/06/20  145/82, 83 - 02/07/20  161/89, 88 - 02/08/20  147/86, 84 - 02/09/20  149/86, 85 - 02/10/20  166/85, 99 - 02/11/20  168/92, 98 - 02/12/20  Patient is currently uncontrolled on the following medications:   Lisinopril 5 mg - 1 tablets daily (AM)   We discussed: consider increase to lisinopril 10 mg daily and recheck BMP in 2-4 weeks  Plan: Consulted with PCP - Increase Lisinopril to 10 mg daily and repeat BMP in 2-4 weeks. Continue to monitor BP daily.  Medication Management  [facility]   Adherence: pillbox, rarely misses doses  Affordability: no concerns  Vaccines: recommend Tdap (interested), Shingrix (not interested)   CCM Follow Up: 02/26/20 at 4:00 PM (telephone)  02/28/20, PharmD Clinical Pharmacist [facility] [phone]

## 2020-02-12 NOTE — Telephone Encounter (Signed)
Left detailed message on Vm per Active FYIs. I will leave open to make sure she schedules the lab appt.

## 2020-02-12 NOTE — Telephone Encounter (Signed)
That sounds good. I sent a new prescription to her pharmacy and ordered the blood work--she just needs a lab appt

## 2020-02-12 NOTE — Patient Instructions (Addendum)
Dear Diana Roberson,  Below is a summary of the goals we discussed during our follow up appointment on February 12, 2020. Please contact me anytime with questions or concerns.   Visit Information  Goals Addressed            This Visit's Progress   . Pharmacy Care Plan       CARE PLAN ENTRY  Current Barriers:  . Chronic Disease Management support, education, and care coordination needs related to hypertension, type 2 diabetes  Pharmacist Clinical Goal(s):  Marland Kitchen Vaccinations: Remain up to date on vaccinations.  . Hypertension: Improve blood pressure within goal of less than 140/90 mmHg. . Manage adverse effects: Manage dry mouth. Recommend trying an over the counter Biotene dry mouth rinse or spray for dry mouth.  . Diabetes: Improve blood glucose control with A1c goal of less than 7%.   Interventions: . Comprehensive medication review performed. . Consult with PCP for lisinopril dose increase and lab monitoring.  Patient Self Care Activities:  . Check blood pressure daily before breakfast, morning medications, and coffee. Always rest for 5 minutes prior to checking blood pressure. Repeat check after 2-3 minutes if blood pressure is above 140/90.  Marland Kitchen Check blood glucose once daily, document, and provide at future visits. Vary time of day (before breakfast, 1-2 hours after largest meal of the day, bedtime).  . Check with local pharmacy for tetanus vaccine (Tdap). . Increase lisinopril to 10 mg daily and schedule lab appointment for 2-4 weeks after dose increase.  Please see past updates related to this goal by clicking on the "Past Updates" button in the selected goal       The patient verbalized understanding of instructions provided today and agreed to receive a mailed copy of patient instruction and/or educational materials.  Telephone follow up appointment with pharmacy team member scheduled for:  02/26/20 at 4:00 PM (telephone)  Diana Roberson, PharmD Clinical Pharmacist [name]  [facility] [phone]   DASH Eating Plan DASH stands for "Dietary Approaches to Stop Hypertension." The DASH eating plan is a healthy eating plan that has been shown to reduce high blood pressure (hypertension). It may also reduce your risk for type 2 diabetes, heart disease, and stroke. The DASH eating plan may also help with weight loss. What are tips for following this plan?  General guidelines  Avoid eating more than 2,300 mg (milligrams) of salt (sodium) a day. If you have hypertension, you may need to reduce your sodium intake to 1,500 mg a day.  Limit alcohol intake to no more than 1 drink a day for nonpregnant women and 2 drinks a day for men. One drink equals 12 oz of beer, 5 oz of wine, or 1 oz of hard liquor.  Work with your health care provider to maintain a healthy body weight or to lose weight. Ask what an ideal weight is for you.  Get at least 30 minutes of exercise that causes your heart to beat faster (aerobic exercise) most days of the week. Activities may include walking, swimming, or biking.  Work with your health care provider or diet and nutrition specialist (dietitian) to adjust your eating plan to your individual calorie needs. Reading food labels   Check food labels for the amount of sodium per serving. Choose foods with less than 5 percent of the Daily Value of sodium. Generally, foods with less than 300 mg of sodium per serving fit into this eating plan.  To find whole grains,  look for the word "whole" as the first word in the ingredient list. Shopping  Buy products labeled as "low-sodium" or "no salt added."  Buy fresh foods. Avoid canned foods and premade or frozen meals. Cooking  Avoid adding salt when cooking. Use salt-free seasonings or herbs instead of table salt or sea salt. Check with your health care provider or pharmacist before using salt substitutes.  Do not fry foods. Cook foods using healthy methods such as baking,  boiling, grilling, and broiling instead.  Cook with heart-healthy oils, such as olive, canola, soybean, or sunflower oil. Meal planning  Eat a balanced diet that includes: ? 5 or more servings of fruits and vegetables each day. At each meal, try to fill half of your plate with fruits and vegetables. ? Up to 6-8 servings of whole grains each day. ? Less than 6 oz of lean meat, poultry, or fish each day. A 3-oz serving of meat is about the same size as a deck of cards. One egg equals 1 oz. ? 2 servings of low-fat dairy each day. ? A serving of nuts, seeds, or beans 5 times each week. ? Heart-healthy fats. Healthy fats called Omega-3 fatty acids are found in foods such as flaxseeds and coldwater fish, like sardines, salmon, and mackerel.  Limit how much you eat of the following: ? Canned or prepackaged foods. ? Food that is high in trans fat, such as fried foods. ? Food that is high in saturated fat, such as fatty meat. ? Sweets, desserts, sugary drinks, and other foods with added sugar. ? Full-fat dairy products.  Do not salt foods before eating.  Try to eat at least 2 vegetarian meals each week.  Eat more home-cooked food and less restaurant, buffet, and fast food.  When eating at a restaurant, ask that your food be prepared with less salt or no salt, if possible. What foods are recommended? The items listed may not be a complete list. Talk with your dietitian about what dietary choices are best for you. Grains Whole-grain or whole-wheat bread. Whole-grain or whole-wheat pasta. Brown rice. Modena Morrow. Bulgur. Whole-grain and low-sodium cereals. Pita bread. Low-fat, low-sodium crackers. Whole-wheat flour tortillas. Vegetables Fresh or frozen vegetables (raw, steamed, roasted, or grilled). Low-sodium or reduced-sodium tomato and vegetable juice. Low-sodium or reduced-sodium tomato sauce and tomato paste. Low-sodium or reduced-sodium canned vegetables. Fruits All fresh, dried, or  frozen fruit. Canned fruit in natural juice (without added sugar). Meat and other protein foods Skinless chicken or [place]. Ground chicken or [place]. Pork with fat trimmed off. Fish and seafood. Egg whites. Dried beans, peas, or lentils. Unsalted nuts, nut butters, and seeds. Unsalted canned beans. Lean cuts of beef with fat trimmed off. Low-sodium, lean deli meat. Dairy Low-fat (1%) or fat-free (skim) milk. Fat-free, low-fat, or reduced-fat cheeses. Nonfat, low-sodium ricotta or cottage cheese. Low-fat or nonfat yogurt. Low-fat, low-sodium cheese. Fats and oils Soft margarine without trans fats. Vegetable oil. Low-fat, reduced-fat, or light mayonnaise and salad dressings (reduced-sodium). Canola, safflower, olive, soybean, and sunflower oils. Avocado. Seasoning and other foods Herbs. Spices. Seasoning mixes without salt. Unsalted popcorn and pretzels. Fat-free sweets. What foods are not recommended? The items listed may not be a complete list. Talk with your dietitian about what dietary choices are best for you. Grains Baked goods made with fat, such as croissants, muffins, or some breads. Dry pasta or rice meal packs. Vegetables Creamed or fried vegetables. Vegetables in a cheese sauce. Regular canned vegetables (not low-sodium or reduced-sodium). Regular  canned tomato sauce and paste (not low-sodium or reduced-sodium). Regular tomato and vegetable juice (not low-sodium or reduced-sodium). Rosita Fire. Olives. Fruits Canned fruit in a light or heavy syrup. Fried fruit. Fruit in cream or butter sauce. Meat and other protein foods Fatty cuts of meat. Ribs. Fried meat. Tomasa Blase. Sausage. Bologna and other processed lunch meats. Salami. Fatback. Hotdogs. Bratwurst. Salted nuts and seeds. Canned beans with added salt. Canned or smoked fish. Whole eggs or egg yolks. Chicken or [place] with skin. Dairy Whole or 2% milk, cream, and half-and-half. Whole or full-fat cream cheese. Whole-fat or sweetened yogurt.  Full-fat cheese. Nondairy creamers. Whipped toppings. Processed cheese and cheese spreads. Fats and oils Butter. Stick margarine. Lard. Shortening. Ghee. Bacon fat. Tropical oils, such as coconut, palm kernel, or palm oil. Seasoning and other foods Salted popcorn and pretzels. Onion salt, garlic salt, seasoned salt, table salt, and sea salt. Worcestershire sauce. Tartar sauce. Barbecue sauce. Teriyaki sauce. Soy sauce, including reduced-sodium. Steak sauce. Canned and packaged gravies. Fish sauce. Oyster sauce. Cocktail sauce. Horseradish that you find on the shelf. Ketchup. Mustard. Meat flavorings and tenderizers. Bouillon cubes. Hot sauce and Tabasco sauce. Premade or packaged marinades. Premade or packaged taco seasonings. Relishes. Regular salad dressings. Where to find more information:  National Heart, Lung, and Blood Institute: PopSteam.is  American Heart Association: www.heart.org Summary  The DASH eating plan is a healthy eating plan that has been shown to reduce high blood pressure (hypertension). It may also reduce your risk for type 2 diabetes, heart disease, and stroke.  With the DASH eating plan, you should limit salt (sodium) intake to 2,300 mg a day. If you have hypertension, you may need to reduce your sodium intake to 1,500 mg a day.  When on the DASH eating plan, aim to eat more fresh fruits and vegetables, whole grains, lean proteins, low-fat dairy, and heart-healthy fats.  Work with your health care provider or diet and nutrition specialist (dietitian) to adjust your eating plan to your individual calorie needs. This information is not intended to replace advice given to you by your health care provider. Make sure you discuss any questions you have with your health care provider. Document Revised: 08/10/2017 Document Reviewed: 08/21/2016 Elsevier Patient Education  2020 [employer].

## 2020-02-16 NOTE — Telephone Encounter (Signed)
Lab appt scheduled.

## 2020-02-23 DIAGNOSIS — M5442 Lumbago with sciatica, left side: Secondary | ICD-10-CM | POA: Diagnosis not present

## 2020-02-23 DIAGNOSIS — E119 Type 2 diabetes mellitus without complications: Secondary | ICD-10-CM | POA: Diagnosis not present

## 2020-02-23 DIAGNOSIS — M48061 Spinal stenosis, lumbar region without neurogenic claudication: Secondary | ICD-10-CM | POA: Diagnosis not present

## 2020-02-23 DIAGNOSIS — G8929 Other chronic pain: Secondary | ICD-10-CM | POA: Diagnosis not present

## 2020-02-26 ENCOUNTER — Other Ambulatory Visit: Payer: Self-pay

## 2020-02-26 ENCOUNTER — Ambulatory Visit: Payer: Medicare Other

## 2020-02-26 ENCOUNTER — Other Ambulatory Visit: Payer: Medicare Other

## 2020-02-26 DIAGNOSIS — I10 Essential (primary) hypertension: Secondary | ICD-10-CM

## 2020-02-26 DIAGNOSIS — E1159 Type 2 diabetes mellitus with other circulatory complications: Secondary | ICD-10-CM

## 2020-02-26 NOTE — Chronic Care Management (AMB) (Signed)
Chronic Care Management Pharmacy  Name: Diana Roberson  MRN: [id number] DOB: Aug 31, 1946  Chief Complaint/ HPI  Diana Roberson,  74 y.o., female presents for their Follow-Up CCM visit with the clinical pharmacist via telephone.  PCP : Diana Schwalbe, MD  Their chronic conditions addressed today include: type 2 diabetes, elevated blood pressure  Patient concerns: Patient called on 6/15 concerned about elevated BG readings due to steroid injection given on 02/23/20 --> advised to monitor BG twice daily and discuss at scheduled f/u visit today 6/17  Last CCM 02/12/20 - increased lisinopril to 10 mg daily   Allergies  Allergen Reactions  . Codeine Sulfate Other (See Comments)    Indigestion and epigastric pain  . Sertraline Hcl Other (See Comments)    REACTION: Worse, ? irritable   Medications: Outpatient Encounter Medications as of 02/26/2020  Medication Sig  . aspirin EC 81 MG tablet Take 81 mg by mouth daily.  Marland Kitchen atorvastatin (LIPITOR) 20 MG tablet TAKE 1 TABLET BY MOUTH  DAILY  . brimonidine (ALPHAGAN) 0.2 % ophthalmic solution Place 1 drop into both eyes 2 (two) times daily.  . calcium gluconate 500 MG tablet Take 1 tablet by mouth 2 (two) times daily.   . cholecalciferol (VITAMIN D) 1000 UNITS tablet Take 1,000 Units by mouth daily.  . cyclobenzaprine (FLEXERIL) 10 MG tablet TAKE 1 TABLET BY MOUTH 3  TIMES DAILY AS NEEDED FOR  MUSCLE SPASM(S)  . dorzolamide-timolol (COSOPT) 22.3-6.8 MG/ML ophthalmic solution Place 1 drop into both eyes 2 (two) times daily.  . famotidine (PEPCID) 20 MG tablet TAKE 1 TABLET BY MOUTH TWO  TIMES DAILY (Patient taking differently: Take 20 mg by mouth daily. In am)  . folic acid (FOLVITE) 1 MG tablet Take 1 tablet (1 mg total) by mouth daily.  Marland Kitchen glucose blood (ONETOUCH VERIO) test strip Use to check blood sugar once a day. Dx Code E11.9  . lisinopril (ZESTRIL) 10 MG tablet Take 1 tablet (10 mg total) by mouth daily.  . metFORMIN (GLUCOPHAGE) 500  MG tablet TAKE 1 TABLET BY MOUTH  TWICE DAILY (Patient taking differently: 1,000 mg. )  . methotrexate (RHEUMATREX) 2.5 MG tablet TAKE 6 TABLETS BY MOUTH  ONCE A WEEK.  CAUTION:CHEMOTHERAPY.  PROTECT FROM LIGHT. (Patient taking differently: Take 15 mg by mouth once a week. )  . OneTouch Delica Lancets 33G MISC 1 each by Does not apply route daily. Dx Code E11.9  . QUEtiapine (SEROQUEL) 50 MG tablet TAKE 1 TABLET BY MOUTH AT  BEDTIME  . vitamin B-12 (CYANOCOBALAMIN) 1000 MCG tablet Take 1,000 mcg by mouth daily.    . vitamin E 400 UNIT capsule Take 400 Units by mouth daily.    . ziprasidone (GEODON) 80 MG capsule TAKE 1 TO 2 CAPSULES BY  MOUTH DAILY   No facility-administered encounter medications on file as of 02/26/2020.   Current Diagnosis/Assessment: Goals    . Pharmacy Care Plan     CARE PLAN ENTRY  Current Barriers:  . Chronic Disease Management support, education, and care coordination needs related to hypertension, type 2 diabetes  Pharmacist Clinical Goal(s):  Marland Kitchen Vaccinations: Remain up to date on vaccinations.  . Hypertension: Improve blood pressure within goal of less than 140/90 mmHg. . Manage adverse effects: Manage dry mouth. Recommend trying an over the counter Biotene dry mouth rinse or spray for dry mouth.  . Diabetes: Improve blood glucose control with A1c goal of less than 7%.   Interventions: . Comprehensive medication review  performed.  Patient Self Care Activities:  . Continue lisinopril 10 mg daily and schedule lab appointment. Check blood pressure twice daily before breakfast and before supper for 7 days prior to next appointment. Always rest for 5 minutes prior to checking blood pressure. Repeat check after 2-3 minutes if blood pressure is above 140/90.  Marland Kitchen Check blood glucose twice daily (before breakfast and 2 hours after first bite of largest meal of the day). Call if blood glucose remains elevated more than 5 days due to steroid injection.   Please see past  updates related to this goal by clicking on the "Past Updates" button in the selected goal        Diabetes   CMP Latest Ref Rng & Units 01/14/2019 11/01/2017 01/17/2016  Glucose 70 - 99 mg/dL 941(D) 408(X) 448(J)  BUN 6 - 23 mg/dL 17 23 15   Creatinine 0.40 - 1.20 mg/dL 8.56 3.14  Sodium 135 - 145 mEq/L 139 140 142  Potassium 3.5 - 5.1 mEq/L 4.8 4.2 4.6  Chloride 96 - 112 mEq/L 106 103 101  CO2 19 - 32 mEq/L 27 27 23   Calcium 8.4 - 10.5 mg/dL 9.3 9.9 9.8  Total Protein 6.0 - 8.3 g/dL 6.8 - 7.6  Total Bilirubin 0.2 - 1.2 mg/dL 0.4 - 0.3  Alkaline Phos 39 - 117 U/L 85 - 93  AST 0 - 37 U/L 16 - 14  ALT 0 - 35 U/L 14 - 10   Recent Relevant Labs: Lab Results  Component Value Date/Time   HGBA1C 6.9 (A) 01/15/2020 02:35 PM   HGBA1C 8.4 (A) 07/17/2019 03:09 PM   HGBA1C 7.2 (H) 01/14/2019 02:26 PM   HGBA1C 7.0 (H) 11/01/2017 04:48 PM   MICROALBUR 1.7 04/30/2015 12:43 PM   MICROALBUR 2.1 (H) 06/09/2013 12:54 PM    Checking BG: several days per week Reports the following readings: 6/14 - 217 (fasting), 503 (after supper) *given steroid injection this day 6/15 - 223 (fasting), 257 (after supper) 6/16 - 125 (fasting) Denies hypoglycemia   A1c goal < 7% Patient has failed these meds in past: none  Patient is currently controlled on the following medications:   Metformin 500 mg - 2 tablets with breakfast and 2 tablets with evening meal   Assessment: BG controlled prior to steroid injection on 6/14 and seems to be returning to normal  Plan: Continue current medications; Continue check BG fasting and after supper. Call if no improvement in BG in 5 days.   Hypertension   BMP Latest Ref Rng & Units 01/14/2019 11/01/2017 01/17/2016  Glucose 70 - 99 mg/dL 11/03/2017) 03/18/2016) 263(Z)  BUN 6 - 23 mg/dL 17 23 15   Creatinine 0.40 - 1.20 mg/dL 858(I 502(D  BUN/Creat Ratio 12 - 28 - - 17  Sodium 135 - 145 mEq/L 139 140 142  Potassium 3.5 - 5.1 mEq/L 4.8 4.2 4.6  Chloride 96 - 112 mEq/L 106 103  101  CO2 19 - 32 mEq/L 27 27 23   Calcium 8.4 - 10.5 mg/dL 9.3 9.9 9.8   Office blood pressures are: BP Readings from Last 3 Encounters:  01/15/20 140/88  07/17/19 124/80  06/24/19 (!) 157/99   Last CCM visit - increased lisinopril to 10 mg daily 02/12/20, pt has not had BMP yet  BP goal  >140/90 mmHg Patient has failed these meds in the past: none Patient checks BP at home: several days per week Reports the following readings:   143/98   136/97  145/98  Patient  is currently uncontrolled on the following medications:   Lisinopril 10 mg - 1 tablet daily (AM)   Assessment: Patient did not get started on lisinopril 10 mg until 1 week ago, BP still elevated. Will give more time and assess BMP prior to further dose adjustments.  Plan: Continue current medications for next 4 weeks. Call to schedule lab appointment.  CCM Follow Up: 03/31/20 at 3:00 PM (telephone)  Debbora Dus, PharmD Clinical Pharmacist [facility] at [facility] [phone]

## 2020-02-27 ENCOUNTER — Other Ambulatory Visit: Payer: Self-pay | Admitting: Internal Medicine

## 2020-03-02 ENCOUNTER — Other Ambulatory Visit (INDEPENDENT_AMBULATORY_CARE_PROVIDER_SITE_OTHER): Payer: Medicare Other

## 2020-03-02 DIAGNOSIS — I10 Essential (primary) hypertension: Secondary | ICD-10-CM

## 2020-03-02 LAB — RENAL FUNCTION PANEL
Albumin: 3.9 g/dL (ref 3.5–5.2)
BUN: 19 mg/dL (ref 6–23)
CO2: 28 mEq/L (ref 19–32)
Calcium: 9.7 mg/dL (ref 8.4–10.5)
Chloride: 106 mEq/L (ref 96–112)
Creatinine, Ser: 0.88 mg/dL (ref 0.40–1.20)
GFR: 62.64 mL/min (ref >60.00)
Glucose, Bld: 246 mg/dL — ABNORMAL HIGH (ref 70–99)
Phosphorus: 4 mg/dL (ref 2.3–4.6)
Potassium: 4.4 mEq/L (ref 3.5–5.1)
Sodium: 140 mEq/L (ref 135–145)

## 2020-03-09 DIAGNOSIS — G8929 Other chronic pain: Secondary | ICD-10-CM | POA: Diagnosis not present

## 2020-03-09 DIAGNOSIS — M5442 Lumbago with sciatica, left side: Secondary | ICD-10-CM | POA: Diagnosis not present

## 2020-03-09 DIAGNOSIS — M48061 Spinal stenosis, lumbar region without neurogenic claudication: Secondary | ICD-10-CM | POA: Diagnosis not present

## 2020-03-10 NOTE — Patient Instructions (Addendum)
Dear [name],  Below is a summary of the goals we discussed during our follow up appointment on February 26, 2020. Please contact me anytime with questions or concerns.   Visit Information  Goals Addressed            This Visit's Progress   . Pharmacy Care Plan       CARE PLAN ENTRY  Current Barriers:  . Chronic Disease Management support, education, and care coordination needs related to hypertension, type 2 diabetes  Pharmacist Clinical Goal(s):  Marland Kitchen Vaccinations: Remain up to date on vaccinations.  . Hypertension: Improve blood pressure within goal of less than 140/90 mmHg. . Manage adverse effects: Manage dry mouth. Recommend trying an over the counter Biotene dry mouth rinse or spray for dry mouth.  . Diabetes: Improve blood glucose control with A1c goal of less than 7%.   Interventions: . Comprehensive medication review performed.  Patient Self Care Activities:  . Continue lisinopril 10 mg daily and schedule lab appointment. Check blood pressure twice daily before breakfast and before supper for 7 days prior to next appointment. Always rest for 5 minutes prior to checking blood pressure. Repeat check after 2-3 minutes if blood pressure is above 140/90.  Marland Kitchen Check blood glucose twice daily (before breakfast and 2 hours after first bite of largest meal of the day). Call if blood glucose remains elevated more than 5 days due to steroid injection.   Please see past updates related to this goal by clicking on the "Past Updates" button in the selected goal       The patient verbalized understanding of instructions provided today and agreed to receive a mailed copy of patient instruction and/or educational materials.  Telephone follow up appointment with pharmacy team member scheduled for: 03/31/20 at 3:00 PM (telephone)  Phil Dopp, PharmD Clinical Pharmacist [facility] at [facility] [phone]

## 2020-03-18 ENCOUNTER — Telehealth: Payer: Self-pay

## 2020-03-18 NOTE — Telephone Encounter (Signed)
It really depends on what the procedure. I don't even know who the doctor is---- a dentist or what?

## 2020-03-18 NOTE — Telephone Encounter (Signed)
Tried to call [place] back but the office is closed. Will try back tomorrow.

## 2020-03-18 NOTE — Telephone Encounter (Signed)
No A temporary spike in sugar is not a problem---she should monitor closely and be careful to drink lots of water!!

## 2020-03-18 NOTE — Telephone Encounter (Addendum)
I called contact #  multiple times and got a v/m and I left v/m requesting cb with what type procedure pt is to have and when. Dr Filomena Jungling specialty is Physiatry. [place] called back 02/23/20 did epidural steroid  injection and pt is diabetic and non fasting BS prior to procedure 241 and pt advised at FU in Dr Mariah Milling office BS was above 500 after procedure. Pt is scheduled to have another epidural steroid injection on 03/24/20;pts non fasting BS will be taken 75' prior to procedure but Dr Mariah Milling wondered if Dr Alphonsus Sias needed to see pt or instruct pt prior to next injection to adjust or add medication so pts BS will not go over 500. [place] request cb after DR Alphonsus Sias reviews this note.

## 2020-03-18 NOTE — Telephone Encounter (Signed)
Diana Roberson, Dr Doyce Loose left v/m requesting medical clearance for in office procedure. Diana Roberson wants to know if can get verbal medical clearance or does Diana Roberson need to fax clinicals to DR Alphonsus Sias. Last appt with Dr Alphonsus Sias was 01/15/20.Please advise.

## 2020-03-23 NOTE — Telephone Encounter (Signed)
[place] called in needing clarification on what they should do tomorrow for procedure. Line disconnected and unable to get them back. Please advise.

## 2020-03-23 NOTE — Telephone Encounter (Signed)
I spoke with [place] and [place] notified of Dr Karle Starch comments on 03/18/20 in this phone note as instructed and [place] voiced understanding.

## 2020-03-24 DIAGNOSIS — G8929 Other chronic pain: Secondary | ICD-10-CM | POA: Diagnosis not present

## 2020-03-24 DIAGNOSIS — M5442 Lumbago with sciatica, left side: Secondary | ICD-10-CM | POA: Diagnosis not present

## 2020-03-24 DIAGNOSIS — E1165 Type 2 diabetes mellitus with hyperglycemia: Secondary | ICD-10-CM | POA: Diagnosis not present

## 2020-03-24 DIAGNOSIS — Z79899 Other long term (current) drug therapy: Secondary | ICD-10-CM | POA: Diagnosis not present

## 2020-03-24 DIAGNOSIS — M059 Rheumatoid arthritis with rheumatoid factor, unspecified: Secondary | ICD-10-CM | POA: Diagnosis not present

## 2020-03-29 ENCOUNTER — Telehealth: Payer: Self-pay

## 2020-03-29 NOTE — Telephone Encounter (Signed)
[facility] Night - Client Nonclinical Telephone Record  [facility] Client [facility] Night - Client Client [facility] - Night Physician Tillman Abide- MD Contact Type Call Who Is Calling Patient / Member / Family / Caregiver Caller Name Diana Roberson Caller Phone Number [phone] Patient Name Diana Roberson Patient DOB 04/17/1946 Call Type Message Only Information Provided Reason for Call Request to Reschedule Office Appointment Initial Comment Caller has appointment at 330 Wednesday that she needs to reschedule. Additional Comment Office hours provided. Triage refused. Caller wants to know if it was a regular appointment. Disp. Time Disposition Final User 03/27/2020 10:46:43 AM General Information Provided Yes Tessa Lerner Call Closed By: Tessa Lerner Transaction Date/Time: 03/27/2020 10:43:48 AM (ET)

## 2020-03-29 NOTE — Telephone Encounter (Signed)
Thank you, I will contact Ms. Coward for rescheduling.  Phil Dopp, PharmD Clinical Pharmacist [facility] at [facility] [phone]

## 2020-03-31 ENCOUNTER — Encounter: Payer: Self-pay | Admitting: Physical Therapy

## 2020-03-31 ENCOUNTER — Other Ambulatory Visit: Payer: Self-pay

## 2020-03-31 ENCOUNTER — Ambulatory Visit: Payer: Medicare Other | Attending: Internal Medicine | Admitting: Physical Therapy

## 2020-03-31 ENCOUNTER — Telehealth: Payer: Medicare Other

## 2020-03-31 DIAGNOSIS — R2681 Unsteadiness on feet: Secondary | ICD-10-CM | POA: Insufficient documentation

## 2020-03-31 NOTE — Therapy (Signed)
[place] [facility] MAIN [facility] SERVICES [address] [place], [place], [address] Phone: [phone]   Fax:  [phone]  Physical Therapy Evaluation  Patient Details  Name: Diana Roberson MRN: [id number] Date of Birth: [birth year] Referring Provider (PT): Dr. Alphonsus Sias   Encounter Date: 03/31/2020   PT End of Session - 03/31/20 1539    Visit Number 1    Number of Visits 17    Date for PT Re-Evaluation 05/26/20    Authorization Type [employer] $35 copay    Authorization Time Period FOTO- PT    PT Start Time 1434    PT Stop Time 1525    PT Time Calculation (min) 51 min    Equipment Utilized During Treatment Gait belt    Activity Tolerance Patient tolerated treatment well    Behavior During Therapy [facility] for tasks assessed/performed;Flat affect           Past Medical History:  Diagnosis Date  . Allergy   . Bipolar 1 disorder (HCC)   . Chronic back pain   . Diabetes mellitus   . Difficult intubation   . Diverticulitis   . GERD (gastroesophageal reflux disease)   . Glaucoma   . Hyperlipidemia   . Hypertension   . Mental health disorder    admitted x3  . Osteopenia   . Rheumatoid arthritis [facility])     Past Surgical History:  Procedure Laterality Date  . APPENDECTOMY    . BACK SURGERY  1988  . BREAST CYST ASPIRATION     neg  . CHOLECYSTECTOMY    . COLONOSCOPY    . COLONOSCOPY WITH PROPOFOL N/A 11/27/2017   Procedure: COLONOSCOPY WITH PROPOFOL;  Surgeon: Christena Deem, MD;  Location: [facility] ENDOSCOPY;  Service: Endoscopy;  Laterality: N/A;  . HAND SURGERY    . ORIF ELBOW FRACTURE Right 06/24/2019   Procedure: OPEN REDUCTION INTERNAL FIXATION (ORIF) ELBOW/OLECRANON FRACTURE;  Surgeon: Christena Flake, MD;  Location: [facility] ORS;  Service: Orthopedics;  Laterality: Right;    There were no vitals filed for this visit.    Subjective Assessment - 03/31/20 1437    Subjective "I can't walk. I've always been shaky and now I'm scared to fall"     Pertinent History 74 yo Female reports experiencing a bad fall in October 2020 and suffered a right elbow fracture. The elbow has healed but she is still having trouble with her balance and mobility; She does report having hammer toes which her husband feels like contributes to imbalance. In addition she does have some tingling in toes/feet; She is not currently using any assistive device; She will hold onto husband's arm when in the community. She reports feeling safer when inside her home. Her PMH is significant for chronic low back pain/sciatica. She has been getting steriod injections which has helped with pain control but she does feel like her back pain limits her mobility; PMH significant: osteopenia, HTN, HLD, DMx2, bipolar disorder,GERD, glaucoma, RA    Limitations Standing;Walking    How long can you sit comfortably? NA    How long can you stand comfortably? able to stand 10 min when cooking supper, but does get fatigued;    How long can you walk comfortably? 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 No/denies              [facility] - Inpatient PT Assessment - 03/31/20 0001      Assessment   Medical Diagnosis Imbalance    Referring Provider (PT) Dr. Alphonsus Sias    Onset Date/Surgical Date --   06/12/2019   Hand Dominance Right    Next MD Visit 8-9 months for physical    Prior Therapy denies any PT for this condition;       Precautions   Precautions Fall    Required Braces or Orthoses --   none     Restrictions   Weight Bearing Restrictions No      Balance Screen   Has the patient fallen in the past 6 months Yes    How many times? 2-3    Has the patient had a decrease in activity level because of a fear of falling?  Yes    Is the patient reluctant to leave their home because of a fear of falling?   Yes      Home Environment   Living Environment Private residence    Living Arrangements Spouse/significant other    Available Help at Discharge Family    Type of Home House    Home Access Stairs to enter    Entrance Stairs-Number of Steps 2    Entrance Stairs-Rails None    Home Layout One level    Home Equipment Walker - 2 wheels;Cane - single point;Shower seat;Grab bars - tub/shower      Prior Function   Level of Independence Independent    Vocation Retired    Leisure likes to read; was walking for 20 min around neighborhood prior to fall in Oct 2020      Cognition   Overall Cognitive Status Within Functional Limits for tasks assessed      Observation/Other Assessments   Observations does have RA and has pronator drift in BUE hands/fingers which limits fine motor tasks;     Focus on Therapeutic Outcomes (FOTO)  59%    Activities of Balance Confidence Scale (ABC Scale)  38.75%      Sensation   Light Touch Appears Intact    Proprioception Appears Intact      Coordination   Gross Motor Movements are Fluid and Coordinated Yes    Fine Motor Movements are Fluid and Coordinated No    Heel Shin Test accurate bilaterally;      Posture/Postural Control   Posture Comments WFL      ROM / Strength   AROM / PROM / Strength AROM;Strength      AROM   Overall AROM Comments BUE and BLE are [facility]      Strength   Strength Assessment Site Hip;Knee;Ankle    Right/Left Hip Right;Left    Right Hip Flexion 4-/5    Right Hip ABduction 4/5    Right Hip ADduction 4/5    Left Hip Flexion 4+/5    Left Hip ABduction 4/5    Left Hip ADduction 4/5    Right/Left Knee Right;Left    Right Knee Flexion 5/5    Right Knee Extension 5/5    Left Knee Flexion 5/5    Left Knee Extension 5/5    Right/Left Ankle Right;Left    Right Ankle Dorsiflexion 4/5    Left Ankle Dorsiflexion 4/5      Transfers   Comments able to transfer sit<>Stand without pushing on arm rests with good control       Ambulation/Gait  Gait Comments ambulates with reciprocal gait pattern, narrow base of support, fair gait speed, slightly slower, does exhibit good heel/toe gait pattern. Exhibits stiffened posture with minimal trunk rotation/arm swing      Standardized Balance Assessment   Standardized Balance Assessment 10 meter walk test;Five Times Sit to Stand;Berg Balance Test    Five times sit to stand comments  13.73 sec with arms across chest    10 Meter Walk 0.833 m/s without AD, home ambulator, slight risk for falls      Berg Balance Test   Sit to Stand Able to stand without using hands and stabilize independently    Standing Unsupported Able to stand safely 2 minutes    Sitting with Back Unsupported but Feet Supported on Floor or Stool Able to sit safely and securely 2 minutes    Stand to Sit Sits safely with minimal use of hands    Transfers Able to transfer safely, minor use of hands    Standing Unsupported with Eyes Closed Able to stand 10 seconds safely    Standing Unsupported with Feet Together Able to place feet together independently and stand for 1 minute with supervision    From Standing, Reach Forward with Outstretched Arm Can reach forward >12 cm safely (5")    From Standing Position, Pick up Object from Floor Able to pick up shoe, needs supervision    From Standing Position, Turn to Look Behind Over each Shoulder Looks behind one side only/other side shows less weight shift    Turn 360 Degrees Able to turn 360 degrees safely but slowly    Standing Unsupported, Alternately Place Feet on Step/Stool Able to complete >2 steps/needs minimal assist    Standing Unsupported, One Foot in Front Able to plae foot ahead of the other independently and hold 30 seconds    Standing on One Leg Tries to lift leg/unable to hold 3 seconds but remains standing independently    Total Score 43                      Objective measurements completed on examination: See above findings.                PT Education - 03/31/20 1538    Education Details Plan of care    Person(s) Educated Patient    Methods Explanation;Verbal cues    Comprehension Verbalized understanding;Returned demonstration;Verbal cues required;Need further instruction            PT Short Term Goals - 03/31/20 1544      PT SHORT TERM GOAL #1   Title Patient will be adherent to HEP at least 3x a week to improve functional strength and balance for better safety at home.    Time 4    Period Weeks    Status New    Target Date 04/28/20      PT SHORT TERM GOAL #2   Title Patient will increase 10 meter walk test to >1.5m/s as to improve gait speed for better community ambulation and to reduce fall risk.    Time 4    Period Weeks    Status New    Target Date 04/28/20             PT Long Term Goals - 03/31/20 1544      PT LONG TERM GOAL #1   Title patient will negotiate 2 steps without rail assist with forward reciprocal pattern without instability to improve safety to enter/exit home.  Time 8    Period Weeks    Status New    Target Date 05/26/20      PT LONG TERM GOAL #2   Title Patient will increase Berg Balance score by > 6 points to demonstrate decreased fall risk during functional activities.    Time 8    Period Weeks    Status New    Target Date 05/26/20      PT LONG TERM GOAL #3   Title Patient will improve FOTO score to >65 to exhibit improved functional mobility with ADLs.    Time 8    Period Weeks    Status New    Target Date 05/26/20      PT LONG TERM GOAL #4   Title Patient will increase ABC scale score >50% to demonstrate better functional mobility and better confidence with ADLs.    Time 8    Period Weeks    Status New    Target Date 05/26/20                  Plan - 03/31/20 1539    Clinical Impression Statement 74 yo Female presents to therapy with gait instability and impaired balance. She reports experiencing a big fall in October 2020  with subsequent right elbow fracture and since then has been fearful of falling. Patient does exhibit mild LE weakness likely from deconditioning. She tested as a slight risk for falls with Berg balance score of 43/56. Patient appears to be more functional but has increased fear of falling. Her ABC score was 38%. She would benefit from skilled PT intervention to improve strength and mobility and increase confidence with ADLs for increased mobility;    Personal Factors and Comorbidities Comorbidity 3+;Fitness;Time since onset of injury/illness/exacerbation    Comorbidities PMH significant: osteopenia, HTN, HLD, DMx2, bipolar disorder,GERD, glaucoma, RA    Examination-Activity Limitations Caring for Others;Carry;Lift;Locomotion Level;Squat;Stairs;Stand;Transfers    Examination-Participation Restrictions Church;Cleaning;Community Activity;Laundry;Shop;Volunteer;Yard Work    Stability/Clinical Decision Making Stable/Uncomplicated    Optometrist Low    Rehab Potential Good    PT Frequency 2x / week    PT Duration 8 weeks    PT Treatment/Interventions ADLs/Self Care Home Management;Cryotherapy;Moist Heat;Gait training;Stair training;Functional mobility training;Therapeutic activities;Therapeutic exercise;Balance training;Neuromuscular re-education;Patient/family education    PT Next Visit Plan address HEP, work on strength/balance    PT Home Exercise Plan will address next session;    Consulted and Agree with Plan of Care Patient           Patient will benefit from skilled therapeutic intervention in order to improve the following deficits and impairments:  Abnormal gait, Decreased balance, Decreased endurance, Decreased mobility, Difficulty walking, Decreased activity tolerance, Decreased safety awareness, Decreased strength  Visit Diagnosis: Unsteadiness on feet     Problem List Patient Active Problem List   Diagnosis Date Noted  . Essential hypertension, benign 01/15/2020  .  Plantar wart of left foot 01/13/2019  . Gastroesophageal reflux disease 11/01/2017  . Type 2 diabetes mellitus with circulatory disorder (HCC) 12/28/2015  . Seropositive rheumatoid arthritis (HCC) 12/28/2015  . Glaucoma 04/30/2015  . Advance directive discussed with patient 04/30/2015  . Routine general medical examination at a health care facility 12/06/2012  . CAROTID BRUIT 11/08/2010  . Bipolar 1 disorder, manic, full remission (HCC) 10/01/2009  . OSTEOPENIA 07/26/2007  . Hyperlipemia 01/22/2007  . DIVERTICULOSIS, COLON 01/08/2007  . Chronic back pain 01/08/2007    Rasean Joos PT, DPT 03/31/2020, 3:48 PM  [facility] MAIN [facility] SERVICES [address] [place], [place], [address] Phone: [phone]   Fax:  [phone]  Name: Diana Roberson MRN: [id number] Date of Birth: [birth year]

## 2020-04-05 ENCOUNTER — Ambulatory Visit: Payer: Medicare Other

## 2020-04-05 ENCOUNTER — Other Ambulatory Visit: Payer: Self-pay

## 2020-04-05 DIAGNOSIS — R2681 Unsteadiness on feet: Secondary | ICD-10-CM

## 2020-04-05 NOTE — Therapy (Signed)
[place] [facility] [facility] MAIN [facility] SERVICES [address] [place], [place], [address] Phone: [phone]   Fax:  [phone]  Physical Therapy Treatment  Patient Details  Name: Diana Roberson MRN: [id number] Date of Birth: [birth year] Referring Provider (PT): Dr. Alphonsus Sias   Encounter Date: 04/05/2020   PT End of Session - 04/05/20 1513    Visit Number 2    Number of Visits 17    Date for PT Re-Evaluation 05/26/20    Authorization Type [employer] $35 copay    Authorization Time Period FOTO- PT    PT Start Time 1514    PT Stop Time 1559    PT Time Calculation (min) 45 min    Equipment Utilized During Treatment Gait belt    Activity Tolerance Patient tolerated treatment well    Behavior During Therapy [facility] for tasks assessed/performed;Flat affect           Past Medical History:  Diagnosis Date  . Allergy   . Bipolar 1 disorder (HCC)   . Chronic back pain   . Diabetes mellitus   . Difficult intubation   . Diverticulitis   . GERD (gastroesophageal reflux disease)   . Glaucoma   . Hyperlipidemia   . Hypertension   . Mental health disorder    admitted x3  . Osteopenia   . Rheumatoid arthritis [facility])     Past Surgical History:  Procedure Laterality Date  . APPENDECTOMY    . BACK SURGERY  1988  . BREAST CYST ASPIRATION     neg  . CHOLECYSTECTOMY    . COLONOSCOPY    . COLONOSCOPY WITH PROPOFOL N/A 11/27/2017   Procedure: COLONOSCOPY WITH PROPOFOL;  Surgeon: Christena Deem, MD;  Location: [facility] ENDOSCOPY;  Service: Endoscopy;  Laterality: N/A;  . HAND SURGERY    . ORIF ELBOW FRACTURE Right 06/24/2019   Procedure: OPEN REDUCTION INTERNAL FIXATION (ORIF) ELBOW/OLECRANON FRACTURE;  Surgeon: Christena Flake, MD;  Location: [facility] ORS;  Service: Orthopedics;  Laterality: Right;    There were no vitals filed for this visit.   Subjective Assessment - 04/05/20 1517    Subjective Patient reports no falls or LOB since last session, reports no pain. Presents  with husband.    Pertinent History 74 yo Female reports experiencing a bad fall in October 2020 and suffered a right elbow fracture. The elbow has healed but she is still having trouble with her balance and mobility; She does report having hammer toes which her husband feels like contributes to imbalance. In addition she does have some tingling in toes/feet; She is not currently using any assistive device; She will hold onto husband's arm when in the community. She reports feeling safer when inside her home. Her PMH is significant for chronic low back pain/sciatica. She has been getting steriod injections which has helped with pain control but she does feel like her back pain limits her mobility; PMH significant: osteopenia, HTN, HLD, DMx2, bipolar disorder,GERD, glaucoma, RA    Limitations Standing;Walking    How long can you sit comfortably? NA    How long can you stand comfortably? able to stand 10 min when cooking supper, but does get fatigued;    How long can you walk comfortably? 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 No/denies            Nustep Lvl 2 Rpm>60 for cardiovascular challenge/support 3 minutes  Standing in // bars:  Standing with CGA next to support surface:  Airex pad: static stand 30 seconds x 2 trials, noticeable trembling of ankles/LE's with fatigue and challenge to maintain stability Airex pad: horizontal head turns 30 seconds scanning room 10x ; cueing for arc of motion  Airex pad: vertical head turns 30 seconds, cueing for arc of motion, noticeable sway with upward gaze increasing demand on ankle righting reaction musculature Airex pad: one foot on 6" step one foot on airex pad, hold position for 30 seconds, switch legs, 2x each LE;  Step over orange hurdle 10x each LE  SUE support ; stabilization to hurdle provided by PT. Cueing for foot placement and awareness of feet.   Speed ladder:  One foot each square decrease from BUE to SUE to no UE support 10x length of // bars  High knee march 2x length of // bars [name] support; cues for height of knee/foot clearance as well as upright posture.  airex balance beam 4x length of // bars finger tip support; frequent cueing for foot placement and sequencing of foot.   Forward ambulation no UE support, backwards ambulation back length of // bars 4x with CGA and cues for increasing step length.   Review and perform HEP:    Access Code: [email or web address] URL: [email or web address] Date: 04/05/2020 Prepared by: Precious Bard  Exercises  . Standing March with Counter Support - 1 x daily - 7 x weekly - 2 sets - 10 reps - 5 hold . Standing Hip Extension with Counter Support - 1 x daily - 7 x weekly - 2 sets - 10 reps - 5 hold . Standing Hip Abduction with Counter Support - 1 x daily - 7 x weekly - 2 sets - 10 reps - 5 hold Heel rises with counter support - 1 x daily - 7 x weekly - 2 sets - 10 reps - 5 hold    Pt educated throughout session about proper posture and technique with exercises. Improved exercise technique, movement at target joints, use of target muscles after min to mod verbal, visual, tactile cues                        PT Education - 04/05/20 1513    Education Details HEP, exercise technique,    Person(s) Educated Patient    Methods Explanation;Demonstration;Tactile cues;Verbal cues    Comprehension Verbalized understanding;Returned demonstration;Verbal cues required;Tactile cues required            PT Short Term Goals - 03/31/20 1544      PT SHORT TERM GOAL #1   Title Patient will be adherent to HEP at least 3x a week to improve functional strength and balance for better safety at home.    Time 4    Period Weeks    Status New    Target Date 04/28/20      PT SHORT  TERM GOAL #2   Title Patient will increase 10 meter walk test to >1.108m/s as to improve gait speed for better community ambulation and to reduce fall risk.    Time 4    Period Weeks    Status New    Target Date 04/28/20             PT Long Term Goals - 03/31/20 1544  PT LONG TERM GOAL #1   Title patient will negotiate 2 steps without rail assist with forward reciprocal pattern without instability to improve safety to enter/exit home.    Time 8    Period Weeks    Status New    Target Date 05/26/20      PT LONG TERM GOAL #2   Title Patient will increase Berg Balance score by > 6 points to demonstrate decreased fall risk during functional activities.    Time 8    Period Weeks    Status New    Target Date 05/26/20      PT LONG TERM GOAL #3   Title Patient will improve FOTO score to >65 to exhibit improved functional mobility with ADLs.    Time 8    Period Weeks    Status New    Target Date 05/26/20      PT LONG TERM GOAL #4   Title Patient will increase ABC scale score >50% to demonstrate better functional mobility and better confidence with ADLs.    Time 8    Period Weeks    Status New    Target Date 05/26/20                 Plan - 04/05/20 1708    Clinical Impression Statement Patient presents with excellent motivation throughout session. Patient is fearful initially of stability interventions however gains confidence with repeated success. Speed ladder improved gait mechanics with noticeable carryover after session end with patient ambulating short distance without UE support from husband with no episodes of LOB. She would benefit from skilled PT intervention to improve strength and mobility and increase confidence with ADLs for increased mobility;    Personal Factors and Comorbidities Comorbidity 3+;Fitness;Time since onset of injury/illness/exacerbation    Comorbidities PMH significant: osteopenia, HTN, HLD, DMx2, bipolar disorder,GERD, glaucoma, RA     Examination-Activity Limitations Caring for Others;Carry;Lift;Locomotion Level;Squat;Stairs;Stand;Transfers    Examination-Participation Restrictions Church;Cleaning;Community Activity;Laundry;Shop;Volunteer;Yard Work    Stability/Clinical Decision Making Stable/Uncomplicated    Rehab Potential Good    PT Frequency 2x / week    PT Duration 8 weeks    PT Treatment/Interventions ADLs/Self Care Home Management;Cryotherapy;Moist Heat;Gait training;Stair training;Functional mobility training;Therapeutic activities;Therapeutic exercise;Balance training;Neuromuscular re-education;Patient/family education    PT Next Visit Plan address HEP, work on strength/balance    PT Home Exercise Plan will address next session;    Consulted and Agree with Plan of Care Patient           Patient will benefit from skilled therapeutic intervention in order to improve the following deficits and impairments:  Abnormal gait, Decreased balance, Decreased endurance, Decreased mobility, Difficulty walking, Decreased activity tolerance, Decreased safety awareness, Decreased strength  Visit Diagnosis: Unsteadiness on feet     Problem List Patient Active Problem List   Diagnosis Date Noted  . Essential hypertension, benign 01/15/2020  . Plantar wart of left foot 01/13/2019  . Gastroesophageal reflux disease 11/01/2017  . Type 2 diabetes mellitus with circulatory disorder (HCC) 12/28/2015  . Seropositive rheumatoid arthritis (HCC) 12/28/2015  . Glaucoma 04/30/2015  . Advance directive discussed with patient 04/30/2015  . Routine general medical examination at a health care facility 12/06/2012  . CAROTID BRUIT 11/08/2010  . Bipolar 1 disorder, manic, full remission (HCC) 10/01/2009  . OSTEOPENIA 07/26/2007  . Hyperlipemia 01/22/2007  . DIVERTICULOSIS, COLON 01/08/2007  . Chronic back pain 01/08/2007   Precious Bard, PT, DPT   04/05/2020, 5:12 PM  [facility] MAIN [facility]  SERVICES [address] [place], [place], [address] Phone: [phone]   Fax:  [phone]  Name: Diana Roberson MRN: [id number] Date of Birth: 1945/11/16

## 2020-04-06 DIAGNOSIS — M48061 Spinal stenosis, lumbar region without neurogenic claudication: Secondary | ICD-10-CM | POA: Diagnosis not present

## 2020-04-06 DIAGNOSIS — G8929 Other chronic pain: Secondary | ICD-10-CM | POA: Diagnosis not present

## 2020-04-06 DIAGNOSIS — M5442 Lumbago with sciatica, left side: Secondary | ICD-10-CM | POA: Diagnosis not present

## 2020-04-12 ENCOUNTER — Ambulatory Visit: Payer: Medicare Other | Attending: Internal Medicine

## 2020-04-12 ENCOUNTER — Other Ambulatory Visit: Payer: Self-pay

## 2020-04-12 DIAGNOSIS — R2681 Unsteadiness on feet: Secondary | ICD-10-CM | POA: Insufficient documentation

## 2020-04-12 NOTE — Therapy (Signed)
[facility] MAIN [facility] SERVICES [address] [place], [place], [address] Phone: [phone]   Fax:  [phone]  Physical Therapy Treatment  Patient Details  Name: Diana Roberson MRN: [id number] Date of Birth: 12/13/1945 Referring Provider (PT): Dr. Alphonsus Sias   Encounter Date: 04/12/2020   PT End of Session - 04/12/20 1352    Visit Number 3    Number of Visits 17    Date for PT Re-Evaluation 05/26/20    Authorization Type [employer] $35 copay    Authorization Time Period FOTO- PT    PT Start Time 0147    PT Stop Time 0230    PT Time Calculation (min) 43 min    Equipment Utilized During Treatment Gait belt    Activity Tolerance Patient tolerated treatment well    Behavior During Therapy [facility] for tasks assessed/performed;Flat affect           Past Medical History:  Diagnosis Date  . Allergy   . Bipolar 1 disorder (HCC)   . Chronic back pain   . Diabetes mellitus   . Difficult intubation   . Diverticulitis   . GERD (gastroesophageal reflux disease)   . Glaucoma   . Hyperlipidemia   . Hypertension   . Mental health disorder    admitted x3  . Osteopenia   . Rheumatoid arthritis [facility])     Past Surgical History:  Procedure Laterality Date  . APPENDECTOMY    . BACK SURGERY  1988  . BREAST CYST ASPIRATION     neg  . CHOLECYSTECTOMY    . COLONOSCOPY    . COLONOSCOPY WITH PROPOFOL N/A 11/27/2017   Procedure: COLONOSCOPY WITH PROPOFOL;  Surgeon: Christena Deem, MD;  Location: [facility] ENDOSCOPY;  Service: Endoscopy;  Laterality: N/A;  . HAND SURGERY    . ORIF ELBOW FRACTURE Right 06/24/2019   Procedure: OPEN REDUCTION INTERNAL FIXATION (ORIF) ELBOW/OLECRANON FRACTURE;  Surgeon: Christena Flake, MD;  Location: [facility] ORS;  Service: Orthopedics;  Laterality: Right;    There were no vitals filed for this visit.   Subjective Assessment - 04/12/20 1349    Subjective Patient reports 2 falls since last session. She tripped on the rug in bathroom  and unable to remember the second fall. No retributions from falls. Presents with husband. No other updates.    Pertinent History 74 yo Female reports experiencing a bad fall in October 2020 and suffered a right elbow fracture. The elbow has healed but she is still having trouble with her balance and mobility; She does report having hammer toes which her husband feels like contributes to imbalance. In addition she does have some tingling in toes/feet; She is not currently using any assistive device; She will hold onto husband's arm when in the community. She reports feeling safer when inside her home. Her PMH is significant for chronic low back pain/sciatica. She has been getting steriod injections which has helped with pain control but she does feel like her back pain limits her mobility; PMH significant: osteopenia, HTN, HLD, DMx2, bipolar disorder,GERD, glaucoma, RA    Limitations Standing;Walking    How long can you sit comfortably? NA    How long can you stand comfortably? able to stand 10 min when cooking supper, but does get fatigued;    How long can you walk comfortably? 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 No/denies               TREATMENT  Therapeutic Exercises Octane Lvl 2 Rpm>60 for cardiovascular challenge/support during history taking 5 minutes; (unbilled 1 minute)   Standing with 1.5# AW:  Hip abductions x 10 each LE, cued to keep foot pointed straight; Hip flexion marches x 10 each LE; hip extension x 15 each LE, cued to keep foot pointed straight;  Standing on foam with 6" step taps x 10 each LE; SUE support needed; Lunges onto bosu ball x 10 each LE with SUE support; Step-ups on 6" step x 10 each LE, cued for SUE support and foot clearance.  Neuromuscular  Re-education Standing with CGA next to support surface with no UE support: Airex pad: static stand 30 seconds, patient wobbly at first. Airex pad: horizontal head turns 30 seconds scanning room 10x; cueing for arc of motion, patient able to maintain balance without UE support. Airex pad: vertical head turns 30 seconds, cueing for arc of motion, noticeably more difficult for patient to maintain balance   Step over orange hurdle 10x each LE SUE support; stabilization to hurdle provided by PT, cued to clear hurdle.   airex balance beam tandem walks x 2 lengths, cued for SUE support; airex balance beam side steps x 2 lengths, cued for no UE support;  Standing on firm surface feet apart then feet together with balloon taps x multiple bouts, patient leans back with therapist supporting patient, unable to correct posture.   Pt educated throughout session about proper posture and technique with exercises. Improved exercise technique, movement at target joints, use of target muscles after min to mod verbal, visual, tactile cues  Patient presents with excellent motivation throughout session. Session focused on a combination of strength and balance exercises. Patient did well with strengthening exercises, but balance exercises were a challenge. During balloon toss, patient leaned on therapist during exercise and unable to correct posture. Next session work on weight shifts to help with ankle/hip strategies to help with balance. She would benefit from skilled PT intervention to improve strength and mobility and increase confidence with ADLs for increased mobility.         PT Short Term Goals - 03/31/20 1544      PT SHORT TERM GOAL #1   Title Patient will be adherent to HEP at least 3x a week to improve functional strength and balance for better safety at home.    Time 4    Period Weeks    Status New    Target Date 04/28/20      PT SHORT TERM GOAL #2   Title Patient will increase 10 meter walk  test to >1.81m/s as to improve gait speed for better community ambulation and to reduce fall risk.    Time 4    Period Weeks    Status New    Target Date 04/28/20             PT Long Term Goals - 03/31/20 1544      PT LONG TERM GOAL #1   Title patient will negotiate 2 steps without rail assist with forward reciprocal pattern without instability to improve safety to enter/exit home.    Time 8    Period Weeks    Status New    Target Date 05/26/20      PT LONG TERM GOAL #2   Title Patient will  increase Berg Balance score by > 6 points to demonstrate decreased fall risk during functional activities.    Time 8    Period Weeks    Status New    Target Date 05/26/20      PT LONG TERM GOAL #3   Title Patient will improve FOTO score to >65 to exhibit improved functional mobility with ADLs.    Time 8    Period Weeks    Status New    Target Date 05/26/20      PT LONG TERM GOAL #4   Title Patient will increase ABC scale score >50% to demonstrate better functional mobility and better confidence with ADLs.    Time 8    Period Weeks    Status New    Target Date 05/26/20                 Plan - 04/12/20 1450    Clinical Impression Statement Patient presents with excellent motivation throughout session. Session focused on a combination of strength and balance exercises. Patient did well with strengthening exercises, but balance exercises were a challenge. During balloon toss, patient leaned on therapist during exercise and unable to correct posture. Next session work on weight shifts to help with ankle/hip strategies to help with balance. She would benefit from skilled PT intervention to improve strength and mobility and increase confidence with ADLs for increased mobility.    Personal Factors and Comorbidities Comorbidity 3+;Fitness;Time since onset of injury/illness/exacerbation    Comorbidities PMH significant: osteopenia, HTN, HLD, DMx2, bipolar disorder,GERD, glaucoma, RA     Examination-Activity Limitations Caring for Others;Carry;Lift;Locomotion Level;Squat;Stairs;Stand;Transfers    Examination-Participation Restrictions Church;Cleaning;Community Activity;Laundry;Shop;Volunteer;Yard Work    Stability/Clinical Decision Making Stable/Uncomplicated    Rehab Potential Good    PT Frequency 2x / week    PT Duration 8 weeks    PT Treatment/Interventions ADLs/Self Care Home Management;Cryotherapy;Moist Heat;Gait training;Stair training;Functional mobility training;Therapeutic activities;Therapeutic exercise;Balance training;Neuromuscular re-education;Patient/family education    PT Next Visit Plan Ankle/hip strategies.    PT Home Exercise Plan will address next session;    Consulted and Agree with Plan of Care Patient           Patient will benefit from skilled therapeutic intervention in order to improve the following deficits and impairments:  Abnormal gait, Decreased balance, Decreased endurance, Decreased mobility, Difficulty walking, Decreased activity tolerance, Decreased safety awareness, Decreased strength  Visit Diagnosis: Unsteadiness on feet     Problem List Patient Active Problem List   Diagnosis Date Noted  . Essential hypertension, benign 01/15/2020  . Plantar wart of left foot 01/13/2019  . Gastroesophageal reflux disease 11/01/2017  . Type 2 diabetes mellitus with circulatory disorder (HCC) 12/28/2015  . Seropositive rheumatoid arthritis (HCC) 12/28/2015  . Glaucoma 04/30/2015  . Advance directive discussed with patient 04/30/2015  . Routine general medical examination at a health care facility 12/06/2012  . CAROTID BRUIT 11/08/2010  . Bipolar 1 disorder, manic, full remission (HCC) 10/01/2009  . OSTEOPENIA 07/26/2007  . Hyperlipemia 01/22/2007  . DIVERTICULOSIS, COLON 01/08/2007  . Chronic back pain 01/08/2007    This entire session was performed under direct supervision and direction of a licensed therapist/therapist assistant . I have  personally read, edited and approve of the note as written.   Katherine Basset, SPT Lynnea Maizes PT, DPT, GCS  Huprich,Jason 04/12/2020, 4:33 PM  [facility] MAIN [facility] SERVICES [address] [place], [place], [address] Phone: [phone]   Fax:  [phone]  Name: Kalyn  SAVAHNA CASADOS MRN: [id number] Date of Birth: 12-Apr-1946

## 2020-04-14 ENCOUNTER — Telehealth: Payer: Self-pay

## 2020-04-14 ENCOUNTER — Other Ambulatory Visit: Payer: Self-pay

## 2020-04-14 ENCOUNTER — Encounter: Payer: Self-pay | Admitting: Physical Therapy

## 2020-04-14 ENCOUNTER — Ambulatory Visit: Payer: Medicare Other | Admitting: Physical Therapy

## 2020-04-14 DIAGNOSIS — R2681 Unsteadiness on feet: Secondary | ICD-10-CM | POA: Diagnosis not present

## 2020-04-14 NOTE — Patient Instructions (Signed)
Access Code: [email or web address] URL: [email or web address] Date: 04/14/2020 Prepared by: Zettie Pho  Exercises Backward Walking with Counter Support - 1 x daily - 5 x weekly - 1 sets - 5 reps Side Stepping with Counter Support - 1 x daily - 5 x weekly - 1 sets - 5 reps

## 2020-04-14 NOTE — Therapy (Signed)
[facility] MAIN [facility] SERVICES [address] [place], [place], [address] Phone: [phone]   Fax:  [phone]  Physical Therapy Treatment  Patient Details  Name: Diana Roberson MRN: [id number] Date of Birth: 1946/06/28 Referring Provider (PT): Dr. Alphonsus Sias   Encounter Date: 04/14/2020   PT End of Session - 04/14/20 1434    Visit Number 4    Number of Visits 17    Date for PT Re-Evaluation 05/26/20    Authorization Type [employer] $35 copay    Authorization Time Period FOTO- PT    PT Start Time 1432    PT Stop Time 1515    PT Time Calculation (min) 43 min    Equipment Utilized During Treatment Gait belt    Activity Tolerance Patient tolerated treatment well    Behavior During Therapy [facility] for tasks assessed/performed;Flat affect           Past Medical History:  Diagnosis Date  . Allergy   . Bipolar 1 disorder (HCC)   . Chronic back pain   . Diabetes mellitus   . Difficult intubation   . Diverticulitis   . GERD (gastroesophageal reflux disease)   . Glaucoma   . Hyperlipidemia   . Hypertension   . Mental health disorder    admitted x3  . Osteopenia   . Rheumatoid arthritis [facility])     Past Surgical History:  Procedure Laterality Date  . APPENDECTOMY    . BACK SURGERY  1988  . BREAST CYST ASPIRATION     neg  . CHOLECYSTECTOMY    . COLONOSCOPY    . COLONOSCOPY WITH PROPOFOL N/A 11/27/2017   Procedure: COLONOSCOPY WITH PROPOFOL;  Surgeon: Christena Deem, MD;  Location: [facility] ENDOSCOPY;  Service: Endoscopy;  Laterality: N/A;  . HAND SURGERY    . ORIF ELBOW FRACTURE Right 06/24/2019   Procedure: OPEN REDUCTION INTERNAL FIXATION (ORIF) ELBOW/OLECRANON FRACTURE;  Surgeon: Christena Flake, MD;  Location: [facility] ORS;  Service: Orthopedics;  Laterality: Right;    There were no vitals filed for this visit.   Subjective Assessment - 04/14/20 1437    Subjective Patient reports doing well; no new falls this week. She reports feeling like  her balance is some better. She denies any pain/soreness.    Pertinent History 74 yo Female reports experiencing a bad fall in October 2020 and suffered a right elbow fracture. The elbow has healed but she is still having trouble with her balance and mobility; She does report having hammer toes which her husband feels like contributes to imbalance. In addition she does have some tingling in toes/feet; She is not currently using any assistive device; She will hold onto husband's arm when in the community. She reports feeling safer when inside her home. Her PMH is significant for chronic low back pain/sciatica. She has been getting steriod injections which has helped with pain control but she does feel like her back pain limits her mobility; PMH significant: osteopenia, HTN, HLD, DMx2, bipolar disorder,GERD, glaucoma, RA    Limitations Standing;Walking    How long can you sit comfortably? NA    How long can you stand comfortably? able to stand 10 min when cooking supper, but does get fatigued;    How long can you walk comfortably? 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 No/denies    Multiple Pain Sites No                 TREATMENT  Therapeutic Exercises Octane Lvl 2 Rpm>30 for cardiovascular challenge x4 minutes; Patient has significant difficulty increasing speed often staying in the mid 20's for steps per minute;  SPO2 99%, HR 105 bpm following exercise;    Neuromuscular Re-education Standing with CGA next to support surface with no UE support: Airex pad: static stand 30 seconds eyes open, 15 sec eyes closed x2 sets each;  Airex: -alternate toe taps airex to 4 inch step with no rail assist x15 reps each with cues for foot placement and weight shift; -standing one foot on airex, one  foot on 4 inch step unsupported, head turns side/side x5 reps each; -standing feet apart, heel raises 3 sec hold x10 reps with min A for safety;   Step over orange hurdle #2 forward reciprocal  10x each LE 2-1 rail support; stabilization to hurdle provided by PT, cued to clear hurdle. Side stepping over orange hurdle #2 x5 reps each direction with 2-0 rail assist;   Resisted walking 12.5# forward/backward, side stepping (2 way) x2 laps each with min A and mod VCs for weight shift and foot placement; Patient had significant difficulty often losing balance with stepping despite cues for weight shift;   Gait outside parallel bars: -forward/backward walking x3 laps -side stepping x2 laps each direction Required intermittent rail assist with cues to increase step length for better balance and gait mechanics;    Pt educated throughout session about proper posture and technique with exercises. Improved exercise technique, movement at target joints, use of target muscles after min to mod verbal, visual, tactile cues  Patient presents with excellent motivation throughout session. She did have difficulty with dynamic balance activities. Advanced HEP with forward/backward walking and side stepping to challenge dynamic balance. Patient verbalized and demonstrated understanding. Provided written HEP for better adherence. She reports increased fatigue at end of session. She would benefit from additional skilled PT Intervention to improve strength, balance and mobility;                       PT Education - 04/14/20 1434    Education Details LE strength, balance, HEP    Person(s) Educated Patient    Methods Explanation;Verbal cues    Comprehension Returned demonstration;Verbalized understanding;Verbal cues required;Need further instruction            PT Short Term Goals - 03/31/20 1544      PT SHORT TERM GOAL #1   Title Patient will be adherent to HEP at least 3x a week to  improve functional strength and balance for better safety at home.    Time 4    Period Weeks    Status New    Target Date 04/28/20      PT SHORT TERM GOAL #2   Title Patient will increase 10 meter walk test to >1.92m/s as to improve gait speed for better community ambulation and to reduce fall risk.    Time 4    Period Weeks    Status New    Target Date 04/28/20             PT Long Term Goals - 03/31/20 1544      PT LONG TERM GOAL #1   Title patient will negotiate 2 steps without rail assist with forward reciprocal  pattern without instability to improve safety to enter/exit home.    Time 8    Period Weeks    Status New    Target Date 05/26/20      PT LONG TERM GOAL #2   Title Patient will increase Berg Balance score by > 6 points to demonstrate decreased fall risk during functional activities.    Time 8    Period Weeks    Status New    Target Date 05/26/20      PT LONG TERM GOAL #3   Title Patient will improve FOTO score to >65 to exhibit improved functional mobility with ADLs.    Time 8    Period Weeks    Status New    Target Date 05/26/20      PT LONG TERM GOAL #4   Title Patient will increase ABC scale score >50% to demonstrate better functional mobility and better confidence with ADLs.    Time 8    Period Weeks    Status New    Target Date 05/26/20                 Plan - 04/15/20 6734    Clinical Impression Statement Patient motivated and participated well within session. She was instructed in advanced balance tasks, utilizing compliant surfaces to challenge stance control. Patient had increased difficulty with narrow base of support and with staggered stance. Patient was able to complete most exercise with less rail assist but does require min A for safety. Patient had significant difficulty with resisted walking requiring mod VCs for weight shift and gait direction. She also required min-mod A for balance recovery. Patient reports increased fatigue at  end of session. She would benefit from additional skilled PT Intervention to improve strength, balance and mobility;    Personal Factors and Comorbidities Comorbidity 3+;Fitness;Time since onset of injury/illness/exacerbation    Comorbidities PMH significant: osteopenia, HTN, HLD, DMx2, bipolar disorder,GERD, glaucoma, RA    Examination-Activity Limitations Caring for Others;Carry;Lift;Locomotion Level;Squat;Stairs;Stand;Transfers    Examination-Participation Restrictions Church;Cleaning;Community Activity;Laundry;Shop;Volunteer;Yard Work    Stability/Clinical Decision Making Stable/Uncomplicated    Rehab Potential Good    PT Frequency 2x / week    PT Duration 8 weeks    PT Treatment/Interventions ADLs/Self Care Home Management;Cryotherapy;Moist Heat;Gait training;Stair training;Functional mobility training;Therapeutic activities;Therapeutic exercise;Balance training;Neuromuscular re-education;Patient/family education    PT Next Visit Plan Ankle/hip strategies.    PT Home Exercise Plan will address next session;    Consulted and Agree with Plan of Care Patient           Patient will benefit from skilled therapeutic intervention in order to improve the following deficits and impairments:  Abnormal gait, Decreased balance, Decreased endurance, Decreased mobility, Difficulty walking, Decreased activity tolerance, Decreased safety awareness, Decreased strength  Visit Diagnosis: Unsteadiness on feet     Problem List Patient Active Problem List   Diagnosis Date Noted  . Essential hypertension, benign 01/15/2020  . Plantar wart of left foot 01/13/2019  . Gastroesophageal reflux disease 11/01/2017  . Type 2 diabetes mellitus with circulatory disorder (HCC) 12/28/2015  . Seropositive rheumatoid arthritis (HCC) 12/28/2015  . Glaucoma 04/30/2015  . Advance directive discussed with patient 04/30/2015  . Routine general medical examination at a health care facility 12/06/2012  . CAROTID BRUIT  11/08/2010  . Bipolar 1 disorder, manic, full remission (HCC) 10/01/2009  . OSTEOPENIA 07/26/2007  . Hyperlipemia 01/22/2007  . DIVERTICULOSIS, COLON 01/08/2007  . Chronic back pain 01/08/2007    Diana Roberson  PT, DPT 04/15/2020, 8:34  AM  [facility] MAIN [facility] SERVICES [address] [place], [place], [address] Phone: [phone]   Fax:  [phone]  Name: Diana Roberson MRN: [id number] Date of Birth: 1945-09-25

## 2020-04-14 NOTE — Progress Notes (Addendum)
Chronic Care Management Pharmacy Assistant   Name: Diana Roberson  MRN: [id number] DOB: June 13, 1946  Reason for Encounter: Disease State  Patient Questions:  1.  Have you seen any other providers since your last visit? Yes, PT 6/29, Rheumotology 7/14, PT 7/21, PT 7/26, PT follow up 7/27, PT 8/2  2.  Any changes in your medicines or health? Yes, Taking Metformin 1,000 mg BID instead of Metformin 500 mg BID    PCP : Karie Schwalbe, MD  Allergies:   Allergies  Allergen Reactions   Codeine Sulfate Other (See Comments)    Indigestion and epigastric pain   Sertraline Hcl Other (See Comments)    REACTION: Worse, ? irritable    Medications: Outpatient Encounter Medications as of 04/14/2020  Medication Sig   aspirin EC 81 MG tablet Take 81 mg by mouth daily.   atorvastatin (LIPITOR) 20 MG tablet TAKE 1 TABLET BY MOUTH  DAILY   brimonidine (ALPHAGAN) 0.2 % ophthalmic solution Place 1 drop into both eyes 2 (two) times daily.   calcium gluconate 500 MG tablet Take 1 tablet by mouth 2 (two) times daily.    cholecalciferol (VITAMIN D) 1000 UNITS tablet Take 1,000 Units by mouth daily.   cyclobenzaprine (FLEXERIL) 10 MG tablet TAKE 1 TABLET BY MOUTH 3  TIMES DAILY AS NEEDED FOR  MUSCLE SPASM(S)   dorzolamide-timolol (COSOPT) 22.3-6.8 MG/ML ophthalmic solution Place 1 drop into both eyes 2 (two) times daily.   famotidine (PEPCID) 20 MG tablet TAKE 1 TABLET BY MOUTH TWO  TIMES DAILY (Patient taking differently: Take 20 mg by mouth daily. In am)   folic acid (FOLVITE) 1 MG tablet Take 1 tablet (1 mg total) by mouth daily.   glucose blood (ONETOUCH VERIO) test strip Use to check blood sugar once a day. Dx Code E11.9   lisinopril (ZESTRIL) 10 MG tablet Take 1 tablet (10 mg total) by mouth daily.   metFORMIN (GLUCOPHAGE) 500 MG tablet TAKE 1 TABLET BY MOUTH  TWICE DAILY (Patient taking differently: 1,000 mg. )   methotrexate (RHEUMATREX) 2.5 MG tablet TAKE 6 TABLETS BY MOUTH  ONCE A WEEK.   CAUTION:CHEMOTHERAPY.  PROTECT FROM LIGHT. (Patient taking differently: Take 15 mg by mouth once a week. )   OneTouch Delica Lancets 33G MISC 1 each by Does not apply route daily. Dx Code E11.9   QUEtiapine (SEROQUEL) 50 MG tablet TAKE 1 TABLET BY MOUTH AT  BEDTIME   vitamin B-12 (CYANOCOBALAMIN) 1000 MCG tablet Take 1,000 mcg by mouth daily.     vitamin E 400 UNIT capsule Take 400 Units by mouth daily.     ziprasidone (GEODON) 80 MG capsule TAKE 1 TO 2 CAPSULES BY  MOUTH DAILY   No facility-administered encounter medications on file as of 04/14/2020.    Current Diagnosis: Patient Active Problem List   Diagnosis Date Noted   Essential hypertension, benign 01/15/2020   Plantar wart of left foot 01/13/2019   Gastroesophageal reflux disease 11/01/2017   Type 2 diabetes mellitus with circulatory disorder (HCC) 12/28/2015   Seropositive rheumatoid arthritis (HCC) 12/28/2015   Glaucoma 04/30/2015   Advance directive discussed with patient 04/30/2015   Routine general medical examination at a health care facility 12/06/2012   CAROTID BRUIT 11/08/2010   Bipolar 1 disorder, manic, full remission (HCC) 10/01/2009   OSTEOPENIA 07/26/2007   Hyperlipemia 01/22/2007   DIVERTICULOSIS, COLON 01/08/2007   Chronic back pain 01/08/2007    Goals Addressed   None     Follow-Up:  [facility] Pharm[facility] Services  Recent Relevant Labs: Lab Results  Component Value Date/Time   HGBA1C 6.9 (A) 01/15/2020 02:35 PM   HGBA1C 8.4 (A) 07/17/2019 03:09 PM   HGBA1C 7.2 (H) 01/14/2019 02:26 PM   HGBA1C 7.0 (H) 11/01/2017 04:48 PM   MICROALBUR 1.7 04/30/2015 12:43 PM   MICROALBUR 2.1 (H) 06/09/2013 12:54 PM    Kidney Function Lab Results  Component Value Date/Time   CREATININE 0.88 03/02/2020 11:09 AM   CREATININE 0.80 01/14/2019 02:26 PM   GFR 62.64 03/02/2020 11:09 AM   GFRNONAA 67 01/17/2016 03:45 PM   GFRAA 77 01/17/2016 03:45 PM    Current antihyperglycemic regimen:  Metformin 1,000  mg Twice Daily What recent interventions/DTPs have been made to improve glycemic control:  Patient has increased to Metformin 1000mg  twice daily   Have there been any recent hospitalizations or ED visits since last visit with CPP? No Patient denies hypoglycemic symptoms Patient denies  hyperglycemic symptoms.  How often are you checking your blood sugar? twice daily What are your blood sugars ranging?  Fasting: pt reports her fasting blood sugars are ranging from 160-205. Has had two that were lower at 91, and 66.  After Lunch: Patient reports blood sugars after lunch are as follows for the past 2 weeks: 223, 145, 127, 365, 278, 148, 204, 212, 217, 336, 256, 220, 164, 148. During the week, how often does your blood glucose drop below 70? Patient reports one recording that was below 70 (66) and did not have any symptoms at that time.  Are you checking your feet daily/regularly? Yes. She reports normality.   Adherence Review: Is the patient currently on a STATIN medication? Yes Is the patient currently on ACE/ARB medication? Yes Does the patient have >5 day gap between last estimated fill dates? No   Reviewed chart prior to disease state call. Spoke with patient regarding BP  Recent Office Vitals: BP Readings from Last 3 Encounters:  01/15/20 140/88  07/17/19 124/80  06/24/19 (!) 157/99   Pulse Readings from Last 3 Encounters:  01/15/20 92  07/17/19 80  06/24/19 87    Wt Readings from Last 3 Encounters:  01/15/20 132 lb (59.9 kg)  07/17/19 136 lb (61.7 kg)  06/24/19 139 lb 15.9 oz (63.5 kg)     Kidney Function Lab Results  Component Value Date/Time   CREATININE 0.88 03/02/2020 11:09 AM   CREATININE 0.80 01/14/2019 02:26 PM   GFR 62.64 03/02/2020 11:09 AM   GFRNONAA 67 01/17/2016 03:45 PM   GFRAA 77 01/17/2016 03:45 PM    BMP Latest Ref Rng & Units 03/02/2020 01/14/2019 11/01/2017  Glucose 70 - 99 mg/dL 11/03/2017) 664(Q) 034(V)  BUN 6 - 23 mg/dL 19 17 23   Creatinine 0.40 - 1.20  mg/dL 425(Z 5.63  BUN/Creat Ratio 12 - 28 - - -  Sodium 135 - 145 mEq/L 140 139 140  Potassium 3.5 - 5.1 mEq/L 4.4 4.8 4.2  Chloride 96 - 112 mEq/L 106 106 103  CO2 19 - 32 mEq/L 28 27 27   Calcium 8.4 - 10.5 mg/dL 9.7 9.3 9.9    Current antihypertensive regimen:  Lisinopril 10 mg daily How often are you checking your Blood Pressure?  Reports mostly checking blood pressure every day but sometimes every other day.  Current home BP readings: 140/80 (average) What recent interventions/DTPs have been made by any provider to improve Blood Pressure control since last CPP Visit: None Any recent hospitalizations or ED visits since last visit with  CPP? No What diet changes have been made to improve Blood Pressure Control?  Reports Breakfast is typically cantaloupe or honeydew melon with eggs or sausage. Lunch is typically a salad, Supper is a lean meat with a vegetable.  She drinks coffee daily and uses creamer Drinks 2-3 glasses of water daily What exercise is being done to improve your Blood Pressure Control?  Patient is currently in physical therapy and does exercises to improve balance right now.  [place] Uselman, CMA Clinical Pharmacist Assistant  [phone]  I have reviewed the care management and care coordination activities outlined in this encounter and I am certifying that I agree with the content of this note. CCM follow up visit scheduled to address high BG readings.   Phil Dopp, PharmD Clinical Pharmacist [facility] [phone]

## 2020-04-15 NOTE — Telephone Encounter (Signed)
She needs to contact [employer].  Take 1 tablet (5 mg total) by mouth daily., Starting Wed 01/21/2020, Until Thu 02/12/2020, Normal  Dispense: 90 tablet  Refills: 3 ordered  Pharmacy: [facility] - [place], [place] - [address] [place], [address] (Ph: [phone])  Order Details Ordered on: 01/21/20  Discontinued on: 02/12/20  Authorizing provider: Karie Schwalbe, MD

## 2020-04-19 ENCOUNTER — Ambulatory Visit: Payer: Medicare Other

## 2020-04-19 ENCOUNTER — Other Ambulatory Visit: Payer: Self-pay

## 2020-04-19 DIAGNOSIS — R2681 Unsteadiness on feet: Secondary | ICD-10-CM

## 2020-04-19 MED ORDER — METFORMIN HCL 500 MG PO TABS: 500.0000 mg | ORAL_TABLET | Freq: Two times a day (BID) | ORAL | 3 refills | Status: DC

## 2020-04-19 NOTE — Telephone Encounter (Signed)
Rx sent electronically.  

## 2020-04-19 NOTE — Therapy (Signed)
[place] [facility] MAIN [facility] SERVICES [address] [place], [place], [address] Phone: [phone]   Fax:  [phone]  Physical Therapy Treatment  Patient Details  Name: Diana Roberson MRN: [id number] Date of Birth: 1946/01/19 Referring Provider (PT): Dr. Alphonsus Sias   Encounter Date: 04/19/2020   PT End of Session - 04/19/20 1538    Visit Number 5    Number of Visits 17    Date for PT Re-Evaluation 05/26/20    Authorization Type [employer] $35 copay    Authorization Time Period FOTO- PT    PT Start Time 1515    PT Stop Time 1600    PT Time Calculation (min) 45 min    Equipment Utilized During Treatment Gait belt    Activity Tolerance Patient tolerated treatment well    Behavior During Therapy [facility] for tasks assessed/performed;Flat affect           Past Medical History:  Diagnosis Date   Allergy    Bipolar 1 disorder (HCC)    Chronic back pain    Diabetes mellitus    Difficult intubation    Diverticulitis    GERD (gastroesophageal reflux disease)    Glaucoma    Hyperlipidemia    Hypertension    Mental health disorder    admitted x3   Osteopenia    Rheumatoid arthritis (HCC)     Past Surgical History:  Procedure Laterality Date   APPENDECTOMY     BACK SURGERY  1988   BREAST CYST ASPIRATION     neg   CHOLECYSTECTOMY     COLONOSCOPY     COLONOSCOPY WITH PROPOFOL N/A 11/27/2017   Procedure: COLONOSCOPY WITH PROPOFOL;  Surgeon: Christena Deem, MD;  Location: [facility] ENDOSCOPY;  Service: Endoscopy;  Laterality: N/A;   HAND SURGERY     ORIF ELBOW FRACTURE Right 06/24/2019   Procedure: OPEN REDUCTION INTERNAL FIXATION (ORIF) ELBOW/OLECRANON FRACTURE;  Surgeon: Christena Flake, MD;  Location: [facility] ORS;  Service: Orthopedics;  Laterality: Right;    There were no vitals filed for this visit.   Subjective Assessment - 04/19/20 1537    Subjective Patient reports doing well. No new falls this week. Denies any pain/sorenes  upon arrival. No specific questions or concerns.    Pertinent History 74 yo Female reports experiencing a bad fall in October 2020 and suffered a right elbow fracture. The elbow has healed but she is still having trouble with her balance and mobility; She does report having hammer toes which her husband feels like contributes to imbalance. In addition she does have some tingling in toes/feet; She is not currently using any assistive device; She will hold onto husband's arm when in the community. She reports feeling safer when inside her home. Her PMH is significant for chronic low back pain/sciatica. She has been getting steriod injections which has helped with pain control but she does feel like her back pain limits her mobility; PMH significant: osteopenia, HTN, HLD, DMx2, bipolar disorder,GERD, glaucoma, RA    Limitations Standing;Walking    How long can you sit comfortably? NA    How long can you stand comfortably? able to stand 10 min when cooking supper, but does get fatigued;    How long can you walk comfortably? 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 No/denies             TREATMENT   Therapeutic Exercises NuStep L1 x 5 minutes for warm-up during history (4 minutes unbilled); Precor BLE leg press 40# 2 x 20, started with 55# attempt however pt unable to perform;   Standing with 2.5# ankle weight (AW)  Hip abductions x 10 BLE, cued to keep foot pointed straight; Hip flexion marches x 10 BLE Hip extension x 10 BLE; HS curls x 10 BLE;  Seated LAQ with 2.5# AW x 10 BLE;   Neuromuscular Re-education 1/2 foam roll balance with round side and flat side up x 30s each; Heel/toe raises x 10 each direction without UE support; Semitandem balance with horizontal and vertical head turns x  30s each with alternating forward LE; Forward step over orange hurdle without UE support x 10 each, stabilization to hurdle provided by PT; Lateral step-over 1/2 foam roll without UE support, occasional single UE tapping for support x 10 each direction;   Pt educated throughout session about proper posture and technique with exercises. Improved exercise technique, movement at target joints, use of target muscles after min to mod verbal, visual, tactile cues   Patient presents with excellent motivation throughout session. She did have difficulty with dynamic balance activities especially step-overs and 1/2 foam roll balance. No HEP progressions provided today. Also included LE strengthening including leg press. Attempted 55 pounds however pt is unable to perform so had to decrease to 40 pounds. Pt encouraged to continue HEP and follow-up as scheduled. She would benefit from additional skilled PT Intervention to improve strength, balance and mobility.                               PT Short Term Goals - 03/31/20 1544      PT SHORT TERM GOAL #1   Title Patient will be adherent to HEP at least 3x a week to improve functional strength and balance for better safety at home.    Time 4    Period Weeks    Status New    Target Date 04/28/20      PT SHORT TERM GOAL #2   Title Patient will increase 10 meter walk test to >1.58m/s as to improve gait speed for better community ambulation and to reduce fall risk.    Time 4    Period Weeks    Status New    Target Date 04/28/20             PT Long Term Goals - 03/31/20 1544      PT LONG TERM GOAL #1   Title patient will negotiate 2 steps without rail assist with forward reciprocal pattern without instability to improve safety to enter/exit home.    Time 8    Period Weeks    Status New    Target Date 05/26/20      PT LONG TERM GOAL #2   Title Patient will increase Berg Balance score by > 6 points to demonstrate  decreased fall risk during functional activities.    Time 8    Period Weeks    Status New    Target Date 05/26/20      PT LONG TERM GOAL #3   Title Patient will improve FOTO score to >65 to exhibit improved functional mobility with ADLs.    Time 8    Period Weeks  Status New    Target Date 05/26/20      PT LONG TERM GOAL #4   Title Patient will increase ABC scale score >50% to demonstrate better functional mobility and better confidence with ADLs.    Time 8    Period Weeks    Status New    Target Date 05/26/20                 Plan - 04/19/20 1538    Personal Factors and Comorbidities Comorbidity 3+;Fitness;Time since onset of injury/illness/exacerbation    Comorbidities PMH significant: osteopenia, HTN, HLD, DMx2, bipolar disorder,GERD, glaucoma, RA    Examination-Activity Limitations Caring for Others;Carry;Lift;Locomotion Level;Squat;Stairs;Stand;Transfers    Examination-Participation Restrictions Church;Cleaning;Community Activity;Laundry;Shop;Volunteer;Yard Work    Stability/Clinical Decision Making Stable/Uncomplicated    Rehab Potential Good    PT Frequency 2x / week    PT Duration 8 weeks    PT Treatment/Interventions ADLs/Self Care Home Management;Cryotherapy;Moist Heat;Gait training;Stair training;Functional mobility training;Therapeutic activities;Therapeutic exercise;Balance training;Neuromuscular re-education;Patient/family education    PT Next Visit Plan Ankle/hip strategies.    PT Home Exercise Plan will address next session;    Consulted and Agree with Plan of Care Patient           Patient will benefit from skilled therapeutic intervention in order to improve the following deficits and impairments:  Abnormal gait, Decreased balance, Decreased endurance, Decreased mobility, Difficulty walking, Decreased activity tolerance, Decreased safety awareness, Decreased strength  Visit Diagnosis: Unsteadiness on feet     Problem List Patient Active  Problem List   Diagnosis Date Noted   Essential hypertension, benign 01/15/2020   Plantar wart of left foot 01/13/2019   Gastroesophageal reflux disease 11/01/2017   Type 2 diabetes mellitus with circulatory disorder (HCC) 12/28/2015   Seropositive rheumatoid arthritis (HCC) 12/28/2015   Glaucoma 04/30/2015   Advance directive discussed with patient 04/30/2015   Routine general medical examination at a health care facility 12/06/2012   CAROTID BRUIT 11/08/2010   Bipolar 1 disorder, manic, full remission (HCC) 10/01/2009   OSTEOPENIA 07/26/2007   Hyperlipemia 01/22/2007   DIVERTICULOSIS, COLON 01/08/2007   Chronic back pain 01/08/2007   Diana Roberson PT, DPT, GCS  [place] 04/19/2020, 5:02 PM  [facility] MAIN [facility] SERVICES [address] [place], [place], [address] Phone: [phone]   Fax:  [phone]  Name: Diana Roberson MRN: [id number] Date of Birth: 06-Sep-1946

## 2020-04-21 ENCOUNTER — Other Ambulatory Visit: Payer: Self-pay

## 2020-04-21 ENCOUNTER — Ambulatory Visit: Payer: Medicare Other

## 2020-04-21 DIAGNOSIS — R2681 Unsteadiness on feet: Secondary | ICD-10-CM

## 2020-04-21 NOTE — Therapy (Signed)
[facility] MAIN [facility] SERVICES [address] [place], [place], [address] Phone: [phone]   Fax:  [phone]  Physical Therapy Treatment  Patient Details  Name: Diana Roberson MRN: [id number] Date of Birth: 1945/09/29 Referring Provider (PT): Dr. Alphonsus Sias   Encounter Date: 04/21/2020   PT End of Session - 04/21/20 1751    Visit Number 6    Number of Visits 17    Date for PT Re-Evaluation 05/26/20    Authorization Type [employer] $35 copay    Authorization Time Period FOTO- PT    PT Start Time 1601    PT Stop Time 1645    PT Time Calculation (min) 44 min    Equipment Utilized During Treatment Gait belt    Activity Tolerance Patient tolerated treatment well    Behavior During Therapy [facility] for tasks assessed/performed;Flat affect           Past Medical History:  Diagnosis Date  . Allergy   . Bipolar 1 disorder (HCC)   . Chronic back pain   . Diabetes mellitus   . Difficult intubation   . Diverticulitis   . GERD (gastroesophageal reflux disease)   . Glaucoma   . Hyperlipidemia   . Hypertension   . Mental health disorder    admitted x3  . Osteopenia   . Rheumatoid arthritis [facility])     Past Surgical History:  Procedure Laterality Date  . APPENDECTOMY    . BACK SURGERY  1988  . BREAST CYST ASPIRATION     neg  . CHOLECYSTECTOMY    . COLONOSCOPY    . COLONOSCOPY WITH PROPOFOL N/A 11/27/2017   Procedure: COLONOSCOPY WITH PROPOFOL;  Surgeon: Christena Deem, MD;  Location: [facility] ENDOSCOPY;  Service: Endoscopy;  Laterality: N/A;  . HAND SURGERY    . ORIF ELBOW FRACTURE Right 06/24/2019   Procedure: OPEN REDUCTION INTERNAL FIXATION (ORIF) ELBOW/OLECRANON FRACTURE;  Surgeon: Christena Flake, MD;  Location: [facility] ORS;  Service: Orthopedics;  Laterality: Right;    There were no vitals filed for this visit.   Subjective Assessment - 04/21/20 1602    Subjective Patient reports doing well. She notes that she had 2 falls in the last 2 and a  half weeks. The first one, she was sitting on the comode dresing and fell backwards. The second one, she fell over in her chair and it took her 45 minutes to get back up. Denies any pain/sorenes upon arrival. No specific questions or concerns.    Pertinent History 74 yo Female reports experiencing a bad fall in October 2020 and suffered a right elbow fracture. The elbow has healed but she is still having trouble with her balance and mobility; She does report having hammer toes which her husband feels like contributes to imbalance. In addition she does have some tingling in toes/feet; She is not currently using any assistive device; She will hold onto husband's arm when in the community. She reports feeling safer when inside her home. Her PMH is significant for chronic low back pain/sciatica. She has been getting steriod injections which has helped with pain control but she does feel like her back pain limits her mobility; PMH significant: osteopenia, HTN, HLD, DMx2, bipolar disorder,GERD, glaucoma, RA    Limitations Standing;Walking    How long can you sit comfortably? NA    How long can you stand comfortably? able to stand 10 min when cooking supper, but does get fatigued;    How long can you walk  comfortably? 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 No/denies           TREATMENT    Therapeutic Exercises  NuStep L2 x 5 minutes for warm-up during history (2 minutes unbilled);  Precor BLE leg press 40# 2 x 20, cued for slow eccentric control;    Standing with 2.5# ankle weight, UE support used: Hip abductions x 10 BLE, cued to keep foot pointed straight;  Hip flexion marches x 20 BLE, cued for maximal ROM Hip extension x 10 BLE;  Heel/toe raises x 10 each direction with UE support;  Seated LAQ  with 2.5# AW x 10 BLE, hold for 3 secs;    Therapeutic Activity Practicing getting up from the floor with the red mat: Getting from the floor to tall kneeling and using the table to get up x 2 rounds, patient unable to get up into quadriped position from the floor without using UE support due to weakness and difficulty accepting weight through BUE;    Neuromuscular Re-education  Step over orange hurdle forward 10x each LE with on/off BUE to SUE to no UE support, stabilization to hurdle provided by PT, cued to clear hurdle.   Side stepping over orange hurdle x 10 reps each direction with SUE support, cued to clear hurdle.  Standing on airex pad with alternate toe taps to 4 inch step with airex pad with no UE support x 15 each.  Standing on airex pad with feet apart and then feet together with balloon taps x multiple bouts. Patient able to reach outside of base of support while maintaining balance, cued for no UE support.   Pt educated throughout session about proper posture and technique with exercises. Improved exercise technique, movement at target joints, use of target muscles after min to mod verbal, visual, tactile cues    Patient presents with excellent motivation throughout session. She did have difficulty with dynamic balance activities especially step-overs. Her balance has improved as the balloon tosses were much better than previous sessions as she was able to reach outside of her BOS without losing balance. Practiced standing up from lying down on the floor so she hopefully get up independently without using furniture. No HEP progressions provided today. Pt encouraged to continue HEP and follow-up as scheduled. She would benefit from additional skilled PT Intervention to improve strength, balance and mobility.           PT Short Term Goals - 03/31/20 1544      PT SHORT TERM GOAL #1   Title Patient will be adherent to HEP at least 3x a week to improve functional strength and  balance for better safety at home.    Time 4    Period Weeks    Status New    Target Date 04/28/20      PT SHORT TERM GOAL #2   Title Patient will increase 10 meter walk test to >1.8m/s as to improve gait speed for better community ambulation and to reduce fall risk.    Time 4    Period Weeks    Status New    Target Date 04/28/20             PT Long Term Goals - 03/31/20 1544  PT LONG TERM GOAL #1   Title patient will negotiate 2 steps without rail assist with forward reciprocal pattern without instability to improve safety to enter/exit home.    Time 8    Period Weeks    Status New    Target Date 05/26/20      PT LONG TERM GOAL #2   Title Patient will increase Berg Balance score by > 6 points to demonstrate decreased fall risk during functional activities.    Time 8    Period Weeks    Status New    Target Date 05/26/20      PT LONG TERM GOAL #3   Title Patient will improve FOTO score to >65 to exhibit improved functional mobility with ADLs.    Time 8    Period Weeks    Status New    Target Date 05/26/20      PT LONG TERM GOAL #4   Title Patient will increase ABC scale score >50% to demonstrate better functional mobility and better confidence with ADLs.    Time 8    Period Weeks    Status New    Target Date 05/26/20                 Plan - 04/21/20 1752    Clinical Impression Statement Patient presents with excellent motivation throughout session. She did have difficulty with dynamic balance activities especially step-overs. Her balance has improved as the balloon tosses were much better than previous sessions as she was able to reach outside of her BOS without losing balance. Practiced standing up from lying down on the floor so she hopefully get up independently without using furniture. No HEP progressions provided today. Pt encouraged to continue HEP and follow-up as scheduled. She would benefit from additional skilled PT Intervention to improve  strength, balance and mobility.    Personal Factors and Comorbidities Comorbidity 3+;Fitness;Time since onset of injury/illness/exacerbation    Comorbidities PMH significant: osteopenia, HTN, HLD, DMx2, bipolar disorder,GERD, glaucoma, RA    Examination-Activity Limitations Caring for Others;Carry;Lift;Locomotion Level;Squat;Stairs;Stand;Transfers    Examination-Participation Restrictions Church;Cleaning;Community Activity;Laundry;Shop;Volunteer;Yard Work    Stability/Clinical Decision Making Stable/Uncomplicated    Rehab Potential Good    PT Frequency 2x / week    PT Duration 8 weeks    PT Treatment/Interventions ADLs/Self Care Home Management;Cryotherapy;Moist Heat;Gait training;Stair training;Functional mobility training;Therapeutic activities;Therapeutic exercise;Balance training;Neuromuscular re-education;Patient/family education    PT Next Visit Plan Ankle/hip strategies.    PT Home Exercise Plan will address next session;    Consulted and Agree with Plan of Care Patient           Patient will benefit from skilled therapeutic intervention in order to improve the following deficits and impairments:  Abnormal gait, Decreased balance, Decreased endurance, Decreased mobility, Difficulty walking, Decreased activity tolerance, Decreased safety awareness, Decreased strength  Visit Diagnosis: Unsteadiness on feet     Problem List Patient Active Problem List   Diagnosis Date Noted  . Essential hypertension, benign 01/15/2020  . Plantar wart of left foot 01/13/2019  . Gastroesophageal reflux disease 11/01/2017  . Type 2 diabetes mellitus with circulatory disorder (HCC) 12/28/2015  . Seropositive rheumatoid arthritis (HCC) 12/28/2015  . Glaucoma 04/30/2015  . Advance directive discussed with patient 04/30/2015  . Routine general medical examination at a health care facility 12/06/2012  . CAROTID BRUIT 11/08/2010  . Bipolar 1 disorder, manic, full remission (HCC) 10/01/2009  .  OSTEOPENIA 07/26/2007  . Hyperlipemia 01/22/2007  . DIVERTICULOSIS, COLON 01/08/2007  . Chronic back  pain 01/08/2007    This entire session was performed under direct supervision and direction of a licensed therapist/therapist assistant . I have personally read, edited and approve of the note as written.   Katherine Basset, SPT Lynnea Maizes PT, DPT, GCS  Huprich,Jason 04/22/2020, 12:26 PM  [place] MAIN [facility] SERVICES [address] [place], [place], [address] Phone: [phone]   Fax:  [phone]  Name: Diana Roberson MRN: [id number] Date of Birth: 15-Aug-1946

## 2020-04-23 DIAGNOSIS — H34831 Tributary (branch) retinal vein occlusion, right eye, with macular edema: Secondary | ICD-10-CM | POA: Diagnosis not present

## 2020-04-23 DIAGNOSIS — H401132 Primary open-angle glaucoma, bilateral, moderate stage: Secondary | ICD-10-CM | POA: Diagnosis not present

## 2020-04-26 ENCOUNTER — Ambulatory Visit: Payer: Medicare Other

## 2020-04-26 ENCOUNTER — Other Ambulatory Visit: Payer: Self-pay

## 2020-04-26 DIAGNOSIS — R2681 Unsteadiness on feet: Secondary | ICD-10-CM | POA: Diagnosis not present

## 2020-04-26 NOTE — Therapy (Signed)
[facility] MAIN [facility] SERVICES [address] [place], [place], [address] Phone: [phone]   Fax:  [phone]  Physical Therapy Treatment  Patient Details  Name: Diana Roberson MRN: [id number] Date of Birth: 18-Dec-1945 Referring Provider (PT): Dr. Alphonsus Sias   Encounter Date: 04/26/2020   PT End of Session - 04/26/20 1335    Visit Number 7    Number of Visits 17    Date for PT Re-Evaluation 05/26/20    Authorization Type [employer] $35 copay    Authorization Time Period FOTO- PT    PT Start Time 1343    PT Stop Time 1429    PT Time Calculation (min) 46 min    Equipment Utilized During Treatment Gait belt    Activity Tolerance Patient tolerated treatment well    Behavior During Therapy [facility] for tasks assessed/performed;Flat affect           Past Medical History:  Diagnosis Date  . Allergy   . Bipolar 1 disorder (HCC)   . Chronic back pain   . Diabetes mellitus   . Difficult intubation   . Diverticulitis   . GERD (gastroesophageal reflux disease)   . Glaucoma   . Hyperlipidemia   . Hypertension   . Mental health disorder    admitted x3  . Osteopenia   . Rheumatoid arthritis [facility])     Past Surgical History:  Procedure Laterality Date  . APPENDECTOMY    . BACK SURGERY  1988  . BREAST CYST ASPIRATION     neg  . CHOLECYSTECTOMY    . COLONOSCOPY    . COLONOSCOPY WITH PROPOFOL N/A 11/27/2017   Procedure: COLONOSCOPY WITH PROPOFOL;  Surgeon: Christena Deem, MD;  Location: [facility] ENDOSCOPY;  Service: Endoscopy;  Laterality: N/A;  . HAND SURGERY    . ORIF ELBOW FRACTURE Right 06/24/2019   Procedure: OPEN REDUCTION INTERNAL FIXATION (ORIF) ELBOW/OLECRANON FRACTURE;  Surgeon: Christena Flake, MD;  Location: [facility] ORS;  Service: Orthopedics;  Laterality: Right;    There were no vitals filed for this visit.   Subjective Assessment - 04/26/20 1335    Subjective Patient reports doing well. No updates since last session, no falls. Denies  any pain/sorenes upon arrival. No specific questions or concerns.    Pertinent History 74 yo Female reports experiencing a bad fall in October 2020 and suffered a right elbow fracture. The elbow has healed but she is still having trouble with her balance and mobility; She does report having hammer toes which her husband feels like contributes to imbalance. In addition she does have some tingling in toes/feet; She is not currently using any assistive device; She will hold onto husband's arm when in the community. She reports feeling safer when inside her home. Her PMH is significant for chronic low back pain/sciatica. She has been getting steriod injections which has helped with pain control but she does feel like her back pain limits her mobility; PMH significant: osteopenia, HTN, HLD, DMx2, bipolar disorder,GERD, glaucoma, RA    Limitations Standing;Walking    How long can you sit comfortably? NA    How long can you stand comfortably? able to stand 10 min when cooking supper, but does get fatigued;    How long can you walk comfortably? 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 No/denies              TREATMENT      Therapeutic Exercises   NuStep L2 x 5 minutes for warm-up during history (2 minutes unbilled);    Precor BLE leg press 40# 2 x 20, cued for slow eccentric control; added GTB to push against to keep L knee from coming past midline.  Standing with 2.5# ankle weight, UE support used:  Hip abductions x 20 BLE, cued to keep foot pointed straight;   Hip flexion marches x 20 BLE, cued for maximal ROM  Hip extension x 20 BLE;   Heel/toe raises x 20 each direction with UE support;    Seated LAQ with 2.5# AW x 10 BLE, cued to hold for 3 secs;    Therapeutic Activity  Practicing  getting up from the floor with the red mat:  Getting from the floor to tall kneeling and using the table to get up x 1 rounds, difficult getting from the floor to tall kneeling attempted 3 times before minimal assist from therapist; patient cued to use UE to push off from the floor and table.   Neuromuscular Re-education   Standing on airex pad with alternate toe taps to 6 inch step with airex pad with SUE to no UE support x 15 each.  Forward and lateral obstacle course with 6" hurdle step and half foam rollers with on/off BUE to SUE to no UE support, cued to take big steps and clear hurdle/foam rollers; Balancing with front foot on dynadisc and rear foot on airex pad 2 x 30 secs each, cued for no UE support but unable to balance for more than 5 seconds without UE support;   Pt educated throughout session about proper posture and technique with exercises. Improved exercise technique, movement at target joints, use of target muscles after min to mod verbal, visual, tactile cues       Patient presents with excellent motivation throughout session. Session focused on a combination of strengthening and balance exercises. She increased reps for standing exercises with ankle weights, showing increased strength. Practiced stepping over objects forward and laterally as she was having difficulty with activity last week. Again practiced standing up from lying down on the floor so she hopefully get up independently without using furniture. No HEP progressions provided today. Pt encouraged to continue HEP and follow-up as scheduled. She would benefit from additional skilled PT Intervention to improve strength, balance and mobility.           PT Short Term Goals - 03/31/20 1544      PT SHORT TERM GOAL #1   Title Patient will be adherent to HEP at least 3x a week to improve functional strength and balance for better safety at home.    Time 4    Period Weeks    Status New    Target Date 04/28/20       PT SHORT TERM GOAL #2   Title Patient will increase 10 meter walk test to >1.37m/s as to improve gait speed for better community ambulation and to reduce fall risk.    Time 4    Period Weeks    Status New    Target Date 04/28/20             PT Long Term Goals - 03/31/20 1544      PT LONG TERM GOAL #1   Title patient will negotiate 2 steps without  rail assist with forward reciprocal pattern without instability to improve safety to enter/exit home.    Time 8    Period Weeks    Status New    Target Date 05/26/20      PT LONG TERM GOAL #2   Title Patient will increase Berg Balance score by > 6 points to demonstrate decreased fall risk during functional activities.    Time 8    Period Weeks    Status New    Target Date 05/26/20      PT LONG TERM GOAL #3   Title Patient will improve FOTO score to >65 to exhibit improved functional mobility with ADLs.    Time 8    Period Weeks    Status New    Target Date 05/26/20      PT LONG TERM GOAL #4   Title Patient will increase ABC scale score >50% to demonstrate better functional mobility and better confidence with ADLs.    Time 8    Period Weeks    Status New    Target Date 05/26/20                 Plan - 04/26/20 1336    Clinical Impression Statement Patient presents with excellent motivation throughout session. Session focused on a combination of strengthening and balance exercises. She increased reps for standing exercises with ankle weights, showing increased strength. Practiced stepping over objects forward and laterally as she was having difficulty with activity last week. Again practiced standing up from lying down on the floor so she hopefully get up independently without using furniture. No HEP progressions provided today. Pt encouraged to continue HEP and follow-up as scheduled. She would benefit from additional skilled PT Intervention to improve strength, balance and mobility.    Personal Factors and Comorbidities  Comorbidity 3+;Fitness;Time since onset of injury/illness/exacerbation    Comorbidities PMH significant: osteopenia, HTN, HLD, DMx2, bipolar disorder,GERD, glaucoma, RA    Examination-Activity Limitations Caring for Others;Carry;Lift;Locomotion Level;Squat;Stairs;Stand;Transfers    Examination-Participation Restrictions Church;Cleaning;Community Activity;Laundry;Shop;Volunteer;Yard Work    Stability/Clinical Decision Making Stable/Uncomplicated    Rehab Potential Good    PT Frequency 2x / week    PT Duration 8 weeks    PT Treatment/Interventions ADLs/Self Care Home Management;Cryotherapy;Moist Heat;Gait training;Stair training;Functional mobility training;Therapeutic activities;Therapeutic exercise;Balance training;Neuromuscular re-education;Patient/family education    PT Next Visit Plan Ankle/hip strategies.    PT Home Exercise Plan will address next session;    Consulted and Agree with Plan of Care Patient           Patient will benefit from skilled therapeutic intervention in order to improve the following deficits and impairments:  Abnormal gait, Decreased balance, Decreased endurance, Decreased mobility, Difficulty walking, Decreased activity tolerance, Decreased safety awareness, Decreased strength  Visit Diagnosis: Unsteadiness on feet     Problem List Patient Active Problem List   Diagnosis Date Noted  . Essential hypertension, benign 01/15/2020  . Plantar wart of left foot 01/13/2019  . Gastroesophageal reflux disease 11/01/2017  . Type 2 diabetes mellitus with circulatory disorder (HCC) 12/28/2015  . Seropositive rheumatoid arthritis (HCC) 12/28/2015  . Glaucoma 04/30/2015  . Advance directive discussed with patient 04/30/2015  . Routine general medical examination at a health care facility 12/06/2012  . CAROTID BRUIT 11/08/2010  . Bipolar 1 disorder, manic, full remission (HCC) 10/01/2009  . OSTEOPENIA 07/26/2007  . Hyperlipemia 01/22/2007  . DIVERTICULOSIS, COLON  01/08/2007  . Chronic back pain 01/08/2007   This entire session was performed under direct supervision  and direction of a licensed Estate agent . I have personally read, edited and approve of the note as written.   Katherine Basset, SPT Lynnea Maizes PT, DPT, GCS  Huprich,Jason 04/27/2020, 8:45 AM  [facility] MAIN [facility] SERVICES [address] [place], [place], [address] Phone: [phone]   Fax:  [phone]  Name: ILAINA BOULER MRN: [id number] Date of Birth: March 21, 1946

## 2020-04-28 ENCOUNTER — Ambulatory Visit: Payer: Medicare Other | Admitting: Physical Therapy

## 2020-04-30 ENCOUNTER — Telehealth: Payer: Self-pay | Admitting: Internal Medicine

## 2020-04-30 MED ORDER — METFORMIN HCL 500 MG PO TABS
1000.0000 mg | ORAL_TABLET | Freq: Two times a day (BID) | ORAL | 3 refills | Status: DC
Start: 2020-04-30 — End: 2021-02-21

## 2020-04-30 NOTE — Telephone Encounter (Signed)
Pt said she is supposed to be on 1000 mg of metformin 2x a day but her current prescription is still showing 500mg  2x a day she would like a call back.

## 2020-04-30 NOTE — Telephone Encounter (Signed)
If she is taking 1000mg  twice a day---please change our med list and send refill for that amount if needed

## 2020-04-30 NOTE — Telephone Encounter (Signed)
I will have to get with Dr Alphonsus Sias as the med list says 500mg  1 twice a day. Last OV note says to continue taking as stated. Notes with pharmacist says she is taking 1000mg  twice a day.

## 2020-04-30 NOTE — Telephone Encounter (Signed)
Spoke to pt. Corrected rx. Sent to [facility] at her request.

## 2020-05-03 ENCOUNTER — Other Ambulatory Visit: Payer: Self-pay

## 2020-05-03 ENCOUNTER — Ambulatory Visit: Payer: Medicare Other

## 2020-05-03 DIAGNOSIS — R2681 Unsteadiness on feet: Secondary | ICD-10-CM | POA: Diagnosis not present

## 2020-05-03 NOTE — Therapy (Signed)
[place] MAIN [facility] SERVICES [place], [place], [address] Phone: [phone]   Fax:  [phone]  Physical Therapy Treatment  Patient Details  Name: Diana Roberson MRN: [id number] Date of Birth: 1945-12-01 Referring Provider (PT): Dr. Silvio Pate   Encounter Date: 05/03/2020   PT End of Session - 05/03/20 1453    Visit Number 8    Number of Visits 17    Date for PT Re-Evaluation 05/26/20    Authorization Type BCBS $35 copay    Authorization Time Period FOTO- PT    PT Start Time 6962    PT Stop Time 1530    PT Time Calculation (min) 45 min    Equipment Utilized During Treatment Gait belt    Activity Tolerance Patient tolerated treatment well    Behavior During Therapy [facility] for tasks assessed/performed;Flat affect           Past Medical History:  Diagnosis Date   Allergy    Bipolar 1 disorder ([place])    Chronic back pain    Diabetes mellitus    Difficult intubation    Diverticulitis    GERD (gastroesophageal reflux disease)    Glaucoma    Hyperlipidemia    Hypertension    Mental health disorder    admitted x3   Osteopenia    Rheumatoid arthritis ([place])     Past Surgical History:  Procedure Laterality Date   APPENDECTOMY     BACK SURGERY  1988   BREAST CYST ASPIRATION     neg   CHOLECYSTECTOMY     COLONOSCOPY     COLONOSCOPY WITH PROPOFOL N/A 11/27/2017   Procedure: COLONOSCOPY WITH PROPOFOL;  Surgeon: Lollie Sails, MD;  Location: [facility] ENDOSCOPY;  Service: Endoscopy;  Laterality: N/A;   HAND SURGERY     ORIF ELBOW FRACTURE Right 06/24/2019   Procedure: OPEN REDUCTION INTERNAL FIXATION (ORIF) ELBOW/OLECRANON FRACTURE;  Surgeon: Corky Mull, MD;  Location: [facility] ORS;  Service: Orthopedics;  Laterality: Right;    There were no vitals filed for this visit.   Subjective Assessment - 05/03/20 1450    Subjective Patient reports doing well. Patient reports falling out of bed Tuesday  morning. She fell on her back but was able to get back up on the first try. Denies any pain/sorenes upon arrival. No specific questions or concerns.    Pertinent History 74 yo Female reports experiencing a bad fall in October 2020 and suffered a right elbow fracture. The elbow has healed but she is still having trouble with her balance and mobility; She does report having hammer toes which her husband feels like contributes to imbalance. In addition she does have some tingling in toes/feet; She is not currently using any assistive device; She will hold onto husband's arm when in the community. She reports feeling safer when inside her home. Her PMH is significant for chronic low back pain/sciatica. She has been getting steriod injections which has helped with pain control but she does feel like her back pain limits her mobility; PMH significant: osteopenia, HTN, HLD, DMx2, bipolar disorder,GERD, glaucoma, RA    Limitations Standing;Walking    How long can you sit comfortably? NA    How long can you stand comfortably? able to stand 10 min when cooking supper, but does get fatigued;    How long can you walk comfortably? >500 feet but does require assistance;    Diagnostic tests Had MRI of lumbar spine in 01/2020:Mild spinal canal stenosis at  L3-L4, fusion of L4-5, L5-S1 in 1988    Patient Stated Goals "walk and not be afraid of falling."    Currently in Pain? No/denies            10MWT: 05/03/20: 13.14 s; 9.78 0.76 m/s;  1.022 m/s ABC: 38.75% , 70.625 BERG:43, 49 FOTO: 59  [facility] PT Assessment - 05/03/20 1507      Berg Balance Test   Sit to Stand Able to stand without using hands and stabilize independently    Standing Unsupported Able to stand safely 2 minutes    Sitting with Back Unsupported but Feet Supported on Floor or Stool Able to sit safely and securely 2 minutes    Stand to Sit Sits safely with minimal use of hands    Transfers Able to transfer safely, minor use of hands    Standing  Unsupported with Eyes Closed Able to stand 10 seconds safely    Standing Unsupported with Feet Together Able to place feet together independently and stand 1 minute safely    From Standing, Reach Forward with Outstretched Arm Can reach confidently >25 cm (10")    From Standing Position, Pick up Object from Floor Able to pick up shoe safely and easily    From Standing Position, Turn to Look Behind Over each Shoulder Looks behind from both sides and weight shifts well    Turn 360 Degrees Able to turn 360 degrees safely one side only in 4 seconds or less    Standing Unsupported, Alternately Place Feet on Step/Stool Able to complete 4 steps without aid or supervision    Standing Unsupported, One Foot in Front Able to plae foot ahead of the other independently and hold 30 seconds    Standing on One Leg Tries to lift leg/unable to hold 3 seconds but remains standing independently    Total Score 49           TREATMENT       Therapeutic Exercises   NuStep L2 x 5 minutes for warm-up during history (2 minutes unbilled);    Updated goals today: 10MWT: self-selected pace: 0.76 m/s; fastest: 1.022 m/s ABC: 70.625% BERG: 49 FOTO: 51  Precor BLE leg press 40# 2 x 20, cued for slow eccentric control;   Walking up and down stairs, working on no UE support going up and minimal coming down x multiple rounds; Eccentric step downs from 6" step x 10 each LE, patient cued to tap heel down slowly, BUE support needed;    Pt educated throughout session about proper posture and technique with exercises. Improved exercise technique, movement at target joints, use of target muscles after min to mod verbal, visual, tactile cues        Patient presents with excellent motivation throughout session. Session focused on strengthening exercises. Updated her goals today as she was wondering about her progress. She increased in her fastest 10MWT to 1.022 m/s, her ABC increased to 70.625%, and BERG increased to 49. The  FOTO decreased from 67 to 51, but she reports that she feels more confident walking and her balance has improved. Areas of improvement are stairs, single leg stance time, and overall balance. Update HEP for possible discharge next visit. Pt encouraged to continue HEP and follow-up as scheduled. She would benefit from additional skilled PT Intervention to improve strength, balance and mobility.      PT Short Term Goals - 05/03/20 1453      PT SHORT TERM GOAL #1   Title Patient  will be adherent to HEP at least 3x a week to improve functional strength and balance for better safety at home.    Baseline 05/03/20: Reports doing exercises at home    Time 4    Period Weeks    Status Achieved    Target Date 04/28/20      PT SHORT TERM GOAL #2   Title Patient will increase 10 meter walk test to >1.43m/s as to improve gait speed for better community ambulation and to reduce fall risk.    Baseline 05/03/20: self-selected: 0.76 m/s, fastest: 1.022 m/s.    Time 4    Period Weeks    Status Partially Met    Target Date 04/28/20             PT Long Term Goals - 05/03/20 1727      PT LONG TERM GOAL #1   Title patient will negotiate 2 steps without rail assist with forward reciprocal pattern without instability to improve safety to enter/exit home.    Baseline 8/23: Patient does not use rail going up with reciprocal pattern, but coming down she uses rail with step to pattern.    Time 8    Period Weeks    Status Partially Met      PT LONG TERM GOAL #2   Title Patient will increase Berg Balance score by > 6 points to demonstrate decreased fall risk during functional activities.    Baseline Eval: 43; 8/23: 49    Time 8    Period Weeks    Status Achieved      PT LONG TERM GOAL #3   Title Patient will improve FOTO score to >65 to exhibit improved functional mobility with ADLs.    Baseline Eval: 59; 8/23: 51    Time 8    Period Weeks    Status On-going      PT LONG TERM GOAL #4   Title Patient  will increase ABC scale score >50% to demonstrate better functional mobility and better confidence with ADLs.    Baseline Eval: 38.75%, 8/23: 49    Time 8    Period Weeks    Status Achieved                 Plan - 05/03/20 1453    Clinical Impression Statement Patient presents with excellent motivation throughout session. Session focused on strengthening exercises. Updated her goals today as she was wondering about her progress. She increased in her fastest 10MWT to 1.022 m/s, her ABC increased to 70.625%, and BERG increased to 49. The FOTO decreased from 43 to 51, but she reports that she feels more confident walking and her balance has improved. Areas of improvement are stairs, single leg stance time, and overall balance. Update HEP for possible discharge next visit. Pt encouraged to continue HEP and follow-up as scheduled. She would benefit from additional skilled PT Intervention to improve strength, balance and mobility.    Personal Factors and Comorbidities Comorbidity 3+;Fitness;Time since onset of injury/illness/exacerbation    Comorbidities PMH significant: osteopenia, HTN, HLD, DMx2, bipolar disorder,GERD, glaucoma, RA    Examination-Activity Limitations Caring for Others;Carry;Lift;Locomotion Level;Squat;Stairs;Stand;Transfers    Examination-Participation Restrictions Church;Cleaning;Community Activity;Laundry;Shop;Volunteer;Yard Work    Stability/Clinical Decision Making Stable/Uncomplicated    Rehab Potential Good    PT Frequency 2x / week    PT Duration 8 weeks    PT Treatment/Interventions ADLs/Self Care Home Management;Cryotherapy;Moist Heat;Gait training;Stair training;Functional mobility training;Therapeutic activities;Therapeutic exercise;Balance training;Neuromuscular re-education;Patient/family education    PT Next Visit  Plan Update HEP    PT Home Exercise Plan will address next session;    Consulted and Agree with Plan of Care Patient           Patient will  benefit from skilled therapeutic intervention in order to improve the following deficits and impairments:  Abnormal gait, Decreased balance, Decreased endurance, Decreased mobility, Difficulty walking, Decreased activity tolerance, Decreased safety awareness, Decreased strength  Visit Diagnosis: Unsteadiness on feet     Problem List Patient Active Problem List   Diagnosis Date Noted   Essential hypertension, benign 01/15/2020   Plantar wart of left foot 01/13/2019   Gastroesophageal reflux disease 11/01/2017   Type 2 diabetes mellitus with circulatory disorder ([place]) 12/28/2015   Seropositive rheumatoid arthritis ([place]) 12/28/2015   Glaucoma 04/30/2015   Advance directive discussed with patient 04/30/2015   Routine general medical examination at a health care facility 12/06/2012   CAROTID BRUIT 11/08/2010   Bipolar 1 disorder, manic, full remission ([place]) 10/01/2009   OSTEOPENIA 07/26/2007   Hyperlipemia 01/22/2007   DIVERTICULOSIS, COLON 01/08/2007   Chronic back pain 01/08/2007    Diana Roberson, SPT Bernita Raisin 05/03/2020, 5:32 PM  [place] MAIN [facility] SERVICES [address] [place], [place], [address] Phone: [phone]   Fax:  [phone]  Name: Diana Roberson MRN: [id number] Date of Birth: Oct 08, 1945

## 2020-05-05 ENCOUNTER — Ambulatory Visit: Payer: Medicare Other

## 2020-05-05 ENCOUNTER — Other Ambulatory Visit: Payer: Self-pay

## 2020-05-05 ENCOUNTER — Other Ambulatory Visit: Payer: Self-pay | Admitting: Internal Medicine

## 2020-05-05 DIAGNOSIS — R2681 Unsteadiness on feet: Secondary | ICD-10-CM | POA: Diagnosis not present

## 2020-05-05 NOTE — Therapy (Signed)
[place] MAIN [facility] SERVICES [address] [place], [place], [address] Phone: [phone]   Fax:  [phone]  Physical Therapy Treatment / Discharge  Patient Details  Name: Diana Roberson MRN: [id number] Date of Birth: 10/27/1945 Referring Provider (PT): Dr. Silvio Pate   Encounter Date: 05/05/2020   PT End of Session - 05/05/20 1438    Visit Number 9    Number of Visits 17    Date for PT Re-Evaluation 05/26/20    Authorization Type [employer] $35 copay    Authorization Time Period FOTO- PT    PT Start Time 1435    PT Stop Time 1515    PT Time Calculation (min) 40 min    Equipment Utilized During Treatment Gait belt    Activity Tolerance Patient tolerated treatment well    Behavior During Therapy [facility] for tasks assessed/performed;Flat affect           Past Medical History:  Diagnosis Date  . Allergy   . Bipolar 1 disorder ([place])   . Chronic back pain   . Diabetes mellitus   . Difficult intubation   . Diverticulitis   . GERD (gastroesophageal reflux disease)   . Glaucoma   . Hyperlipidemia   . Hypertension   . Mental health disorder    admitted x3  . Osteopenia   . Rheumatoid arthritis [facility])     Past Surgical History:  Procedure Laterality Date  . APPENDECTOMY    . BACK SURGERY  1988  . BREAST CYST ASPIRATION     neg  . CHOLECYSTECTOMY    . COLONOSCOPY    . COLONOSCOPY WITH PROPOFOL N/A 11/27/2017   Procedure: COLONOSCOPY WITH PROPOFOL;  Surgeon: Lollie Sails, MD;  Location: [facility] ENDOSCOPY;  Service: Endoscopy;  Laterality: N/A;  . HAND SURGERY    . ORIF ELBOW FRACTURE Right 06/24/2019   Procedure: OPEN REDUCTION INTERNAL FIXATION (ORIF) ELBOW/OLECRANON FRACTURE;  Surgeon: Corky Mull, MD;  Location: [facility] ORS;  Service: Orthopedics;  Laterality: Right;    There were no vitals filed for this visit.   Subjective Assessment - 05/05/20 1437    Subjective Patient reports doing well. Denies any pain/sorenes upon arrival.  No specific questions or concerns.    Pertinent History 74 yo Female reports experiencing a bad fall in October 2020 and suffered a right elbow fracture. The elbow has healed but she is still having trouble with her balance and mobility; She does report having hammer toes which her husband feels like contributes to imbalance. In addition she does have some tingling in toes/feet; She is not currently using any assistive device; She will hold onto husband's arm when in the community. She reports feeling safer when inside her home. Her PMH is significant for chronic low back pain/sciatica. She has been getting steriod injections which has helped with pain control but she does feel like her back pain limits her mobility; PMH significant: osteopenia, HTN, HLD, DMx2, bipolar disorder,GERD, glaucoma, RA    Limitations Standing;Walking    How long can you sit comfortably? NA    How long can you stand comfortably? able to stand 10 min when cooking supper, but does get fatigued;    How long can you walk comfortably? 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 No/denies             TREATMENT    Therapeutic Exercises   NuStep L2 x 5 minutes for warm-up during history (2 minutes unbilled);     Reviewed and performed HEP with patient: Abductions with resistance band around knees x 10 BLE; Extensions with resistance band around knees x 10 BLE; Hip flexion with resistance band around knees x 10 BLE; Heel toe raises x 10; Single leg stance with support x 30 seconds BLE; Standing tandem balance with counter support x 30 seconds BLE; Sit to stands 2 x 10;  Walking up and down stairs, working on no UE support going up and minimal coming down x 3 rounds; Eccentric step downs from 6" step x 10 each LE, patient  cued to tap heel down slowly, BUE support needed;  Therapeutic Activity  Practicing getting up from the floor with the red mat:  Getting from the floor to tall kneeling and using the table to get up x 2 rounds, patient cued to use UE to push off from the floor and table. Patient was successful both times on the first try.  Pt educated throughout session about proper posture and technique with exercises. Improved exercise technique, movement at target joints, use of target muscles after min to mod verbal, visual, tactile cues.    Patient presents with excellent motivation throughout session. Session focused on strengthening exercises and functional activities. She successfully got up from the floor twice without any assistance both times. Updated her HEP with strengthening and balance exercises which she showed understanding. Updated her goals last session so they were not updated again today. She improved in all her outcome measures except the FOTO. She reports that she feels more confident walking and her balance has improved. She was educated to look into getting a bed rail to prevent future falls off her bed. Patient will be discharged at this time, but encouraged to see her doctor if she starts to decline for referral to physical therapy.     PT Short Term Goals - 05/06/20 1323      PT SHORT TERM GOAL #1   Title Patient will be adherent to HEP at least 3x a week to improve functional strength and balance for better safety at home.    Baseline 05/03/20: Reports doing exercises at home    Time 4    Period Weeks    Status Achieved      PT SHORT TERM GOAL #2   Title Patient will increase 10 meter walk test to >1.55m/s as to improve gait speed for better community ambulation and to reduce fall risk.    Baseline 05/03/20: self-selected: 0.76 m/s, fastest: 1.022 m/s.    Time 4    Period Weeks    Status Partially Met             PT Long Term Goals - 05/06/20 1324      PT LONG TERM GOAL #1    Title patient will negotiate 2 steps without rail assist with forward reciprocal pattern without instability to improve safety to enter/exit home.    Baseline 8/23: Patient does not use rail going up with reciprocal pattern, but coming down she uses rail with step to pattern.    Time 8    Period Weeks    Status Partially Met      PT LONG TERM GOAL #2   Title Patient will increase Berg Balance score by > 6 points to  demonstrate decreased fall risk during functional activities.    Baseline Eval: 43; 8/23: 49    Time 8    Period Weeks    Status Achieved      PT LONG TERM GOAL #3   Title Patient will improve FOTO score to >65 to exhibit improved functional mobility with ADLs.    Baseline Eval: 59; 8/23: 51    Time 8    Period Weeks    Status On-going      PT LONG TERM GOAL #4   Title Patient will increase ABC scale score >50% to demonstrate better functional mobility and better confidence with ADLs.    Baseline Eval: 38.75%, 8/23: 49    Time 8    Period Weeks    Status Achieved                 Plan - 05/05/20 1441    Clinical Impression Statement Patient presents with excellent motivation throughout session. Session focused on strengthening exercises and functional activities. She successfully got up from the floor twice without any assistance both times. Updated her HEP with strengthening and balance exercises which she showed understanding. Updated her goals last session so they were not updated again today. She improved in all her outcome measures except the FOTO. She reports that she feels more confident walking and her balance has improved. She was educated to look into getting a bed rail to prevent future falls off her bed. Patient will be discharged at this time, but encouraged to see her doctor if she starts to decline for referral to physical therapy.    Personal Factors and Comorbidities Comorbidity 3+;Fitness;Time since onset of injury/illness/exacerbation     Comorbidities PMH significant: osteopenia, HTN, HLD, DMx2, bipolar disorder,GERD, glaucoma, RA    Examination-Activity Limitations Caring for Others;Carry;Lift;Locomotion Level;Squat;Stairs;Stand;Transfers    Examination-Participation Restrictions Church;Cleaning;Community Activity;Laundry;Shop;Volunteer;Yard Work    Stability/Clinical Decision Making Stable/Uncomplicated    Rehab Potential Good    PT Frequency 2x / week    PT Duration 8 weeks    PT Treatment/Interventions ADLs/Self Care Home Management;Cryotherapy;Moist Heat;Gait training;Stair training;Functional mobility training;Therapeutic activities;Therapeutic exercise;Balance training;Neuromuscular re-education;Patient/family education    PT Next Visit Plan Discharge    PT Home Exercise Plan Med[employer] Access Code: [phone]    Consulted and Agree with Plan of Care Patient           Patient will benefit from skilled therapeutic intervention in order to improve the following deficits and impairments:  Abnormal gait, Decreased balance, Decreased endurance, Decreased mobility, Difficulty walking, Decreased activity tolerance, Decreased safety awareness, Decreased strength  Visit Diagnosis: Unsteadiness on feet     Problem List Patient Active Problem List   Diagnosis Date Noted  . Essential hypertension, benign 01/15/2020  . Plantar wart of left foot 01/13/2019  . Gastroesophageal reflux disease 11/01/2017  . Type 2 diabetes mellitus with circulatory disorder ([facility]) 12/28/2015  . Seropositive rheumatoid arthritis ([facility]) 12/28/2015  . Glaucoma 04/30/2015  . Advance directive discussed with patient 04/30/2015  . Routine general medical examination at a health care facility 12/06/2012  . CAROTID BRUIT 11/08/2010  . Bipolar 1 disorder, manic, full remission ([facility]) 10/01/2009  . OSTEOPENIA 07/26/2007  . Hyperlipemia 01/22/2007  . DIVERTICULOSIS, COLON 01/08/2007  . Chronic back pain 01/08/2007    This entire session was performed  under direct supervision and direction of a licensed therapist/therapist assistant . I have personally read, edited and approve of the note as written.   Katherine Basset, SPT Lynnea Maizes PT, DPT,  GCS  Huprich,Jason 05/06/2020, 1:25 PM  [place] MAIN [facility] SERVICES [address] [place], [place], [address] Phone: [phone]   Fax:  [phone]  Name: Diana Roberson MRN: [id number] Date of Birth: 11/15/1945

## 2020-05-05 NOTE — Patient Instructions (Signed)
Access Code: [email or web address] URL: [email or web address] Date: 05/05/2020 Prepared by: Ria Comment  Exercises   Standing Hip Flexion with Resistance Band at Counter Support - 1 x daily - 7 x weekly - 2 sets - 10 reps - 5s hold  Standing Hip Extension with Resistance Band at Counter Support - 1 x daily - 7 x weekly - 2 sets - 10 reps - 5s hold  Standing Hip Abduction with Resistance Band at Counter Support - 1 x daily - 7 x weekly - 2 sets - 10 reps - 5s hold  Heel Rises with Counter Support- 1 x daily - 7 x weekly - 2 sets - 10 reps - 5s hold  Single leg stance with Counter Support - 1 x daily - 7 x weekly - 2 sets - 30s hold  Standing Tandem Balance with Counter Support - 1 x daily - 7 x weekly - 2 sets - 30s hold  Sit to Stands with no arm support - 1 x daily - 7 x weekly - 2 sets - 10 reps

## 2020-05-07 DIAGNOSIS — H401112 Primary open-angle glaucoma, right eye, moderate stage: Secondary | ICD-10-CM | POA: Diagnosis not present

## 2020-05-07 LAB — HM DIABETES EYE EXAM

## 2020-05-10 ENCOUNTER — Ambulatory Visit: Payer: Medicare Other

## 2020-05-19 ENCOUNTER — Ambulatory Visit: Payer: Medicare Other

## 2020-05-24 ENCOUNTER — Ambulatory Visit: Payer: Medicare Other

## 2020-05-26 ENCOUNTER — Ambulatory Visit: Payer: Medicare Other

## 2020-06-11 ENCOUNTER — Telehealth: Payer: Self-pay | Admitting: Internal Medicine

## 2020-06-11 NOTE — Telephone Encounter (Signed)
Patient called. Patient said she wanted to talk to [facility].  Patient said for the past month her sugar has been running between 200-250.  Patient wants to know if she needs to add a medication. Patient's husband takes glimepiride.  Patient uses [facility].

## 2020-06-13 NOTE — Telephone Encounter (Signed)
Her last A1c was fine. I would recommend pushing up her appt to the next week or so to review and check A1c and her home numbers

## 2020-06-14 NOTE — Telephone Encounter (Signed)
I spoke to patient and she scheduled appointment on 06/16/20 at 11:15.

## 2020-06-15 ENCOUNTER — Other Ambulatory Visit: Payer: Self-pay

## 2020-06-15 DIAGNOSIS — M05771 Rheumatoid arthritis with rheumatoid factor of right ankle and foot without organ or systems involvement: Secondary | ICD-10-CM | POA: Diagnosis not present

## 2020-06-15 DIAGNOSIS — M2011 Hallux valgus (acquired), right foot: Secondary | ICD-10-CM | POA: Diagnosis not present

## 2020-06-15 DIAGNOSIS — M2012 Hallux valgus (acquired), left foot: Secondary | ICD-10-CM | POA: Diagnosis not present

## 2020-06-15 DIAGNOSIS — M2042 Other hammer toe(s) (acquired), left foot: Secondary | ICD-10-CM | POA: Diagnosis not present

## 2020-06-15 DIAGNOSIS — M2041 Other hammer toe(s) (acquired), right foot: Secondary | ICD-10-CM | POA: Diagnosis not present

## 2020-06-15 MED ORDER — FAMOTIDINE 20 MG PO TABS: 20.0000 mg | ORAL_TABLET | Freq: Every day | ORAL | 3 refills | Status: DC

## 2020-06-15 NOTE — Telephone Encounter (Signed)
Rx sent electronically.  

## 2020-06-16 ENCOUNTER — Ambulatory Visit (INDEPENDENT_AMBULATORY_CARE_PROVIDER_SITE_OTHER): Payer: Medicare Other | Admitting: Internal Medicine

## 2020-06-16 ENCOUNTER — Other Ambulatory Visit: Payer: Self-pay

## 2020-06-16 ENCOUNTER — Encounter: Payer: Self-pay | Admitting: Internal Medicine

## 2020-06-16 VITALS — BP 128/80 | HR 89 | Temp 97.6°F | Ht 64.0 in | Wt 131.0 lb

## 2020-06-16 DIAGNOSIS — M059 Rheumatoid arthritis with rheumatoid factor, unspecified: Secondary | ICD-10-CM

## 2020-06-16 DIAGNOSIS — I10 Essential (primary) hypertension: Secondary | ICD-10-CM

## 2020-06-16 DIAGNOSIS — E1159 Type 2 diabetes mellitus with other circulatory complications: Secondary | ICD-10-CM

## 2020-06-16 DIAGNOSIS — F3174 Bipolar disorder, in full remission, most recent episode manic: Secondary | ICD-10-CM | POA: Diagnosis not present

## 2020-06-16 LAB — POCT GLYCOSYLATED HEMOGLOBIN (HGB A1C): Hemoglobin A1C: 7.7 % — AB (ref 4.0–5.6)

## 2020-06-16 MED ORDER — METHOTREXATE 2.5 MG PO TABS: 17.5000 mg | ORAL_TABLET | ORAL | 0 refills | Status: DC

## 2020-06-16 NOTE — Assessment & Plan Note (Addendum)
Lab Results  Component Value Date   HGBA1C 7.7 (A) 06/16/2020   Control has slipped ---likely related to [facility] Will monitor for now on just the metformin 1000 bid If continues high, will consider adding SGLT inhibitor (like farxiga) She did try husband's glimepiride right after injection--and sugar was 500 and she tolerated that. Could consider just adding 1mg  of that

## 2020-06-16 NOTE — Assessment & Plan Note (Signed)
MTX was increased to 17.5mg  weekly by rheumatologist

## 2020-06-16 NOTE — Assessment & Plan Note (Signed)
Doing well on both geodon and seroquel. Weaning is not a good idea

## 2020-06-16 NOTE — Patient Instructions (Signed)
Let me know if your fasting sugars remain over 160-170 in the next month or so-----I would add another medication

## 2020-06-16 NOTE — Progress Notes (Signed)
Subjective:    Patient ID: Diana Roberson, female    DOB: 03-Nov-1945, 74 y.o.   MRN: [id number]  HPI Here for follow up of diabetes and other chronic health conditions This visit occurred during the SARS-CoV-2 public health emergency.  Safety protocols were in place, including screening questions prior to the visit, additional usage of staff PPE, and extensive cleaning of exam room while observing appropriate contact time as indicated for disinfecting solutions.   Sugars up a bit Checks every day Did have cortisone injections by Dr Danton Sewer in July Sugars up after this Usually 120-130's----but up as high as 200 Continues on the metformin  Went to podiatrist yesterday Getting Rx for bad callouses No sensory changes  No chest pain No SOB  No mania  No depression  RA doing okay MTX was increased to 17.5mg  weekly  Current Outpatient Medications on File Prior to Visit  Medication Sig Dispense Refill  . aspirin EC 81 MG tablet Take 81 mg by mouth daily.    Marland Kitchen atorvastatin (LIPITOR) 20 MG tablet TAKE 1 TABLET BY MOUTH  DAILY 90 tablet 0  . brimonidine (ALPHAGAN) 0.2 % ophthalmic solution Place 1 drop into both eyes 2 (two) times daily.    . calcium gluconate 500 MG tablet Take 1 tablet by mouth 2 (two) times daily.     . cholecalciferol (VITAMIN D) 1000 UNITS tablet Take 1,000 Units by mouth daily.    . cyclobenzaprine (FLEXERIL) 10 MG tablet TAKE 1 TABLET BY MOUTH 3  TIMES DAILY AS NEEDED FOR  MUSCLE SPASM(S) 180 tablet 0  . dorzolamide-timolol (COSOPT) 22.3-6.8 MG/ML ophthalmic solution Place 1 drop into both eyes 2 (two) times daily.    . famotidine (PEPCID) 20 MG tablet Take 1 tablet (20 mg total) by mouth daily. In am 90 tablet 3  . folic acid (FOLVITE) 1 MG tablet Take 1 tablet (1 mg total) by mouth daily. 90 tablet 3  . glucose blood (ONETOUCH VERIO) test strip Use to check blood sugar once a day. Dx Code E11.9 100 each 3  . lisinopril (ZESTRIL) 10 MG tablet Take 1  tablet (10 mg total) by mouth daily. 90 tablet 3  . metFORMIN (GLUCOPHAGE) 500 MG tablet Take 2 tablets (1,000 mg total) by mouth 2 (two) times daily with a meal. 360 tablet 3  . methotrexate (RHEUMATREX) 2.5 MG tablet TAKE 6 TABLETS BY MOUTH  ONCE A WEEK.  CAUTION:CHEMOTHERAPY.  PROTECT FROM LIGHT. (Patient taking differently: Take 15 mg by mouth once a week. ) 78 tablet 3  . OneTouch Delica Lancets 33G MISC 1 each by Does not apply route daily. Dx Code E11.9 100 each 3  . QUEtiapine (SEROQUEL) 50 MG tablet TAKE 1 TABLET BY MOUTH AT  BEDTIME 90 tablet 0  . vitamin B-12 (CYANOCOBALAMIN) 1000 MCG tablet Take 1,000 mcg by mouth daily.      . vitamin E 400 UNIT capsule Take 400 Units by mouth daily.      . ziprasidone (GEODON) 80 MG capsule TAKE 1 TO 2 CAPSULES BY  MOUTH DAILY 180 capsule 3   No current facility-administered medications on file prior to visit.    Allergies  Allergen Reactions  . Codeine Sulfate Other (See Comments)    Indigestion and epigastric pain  . Sertraline Hcl Other (See Comments)    REACTION: Worse, ? irritable    Past Medical History:  Diagnosis Date  . Allergy   . Bipolar 1 disorder (HCC)   . Chronic back  pain   . Diabetes mellitus   . Difficult intubation   . Diverticulitis   . GERD (gastroesophageal reflux disease)   . Glaucoma   . Hyperlipidemia   . Hypertension   . Mental health disorder    admitted x3  . Osteopenia   . Rheumatoid arthritis [facility])     Past Surgical History:  Procedure Laterality Date  . APPENDECTOMY    . BACK SURGERY  1988  . BREAST CYST ASPIRATION     neg  . CHOLECYSTECTOMY    . COLONOSCOPY    . COLONOSCOPY WITH PROPOFOL N/A 11/27/2017   Procedure: COLONOSCOPY WITH PROPOFOL;  Surgeon: Christena Deem, MD;  Location[facility] ENDOSCOPY;  Service: Endoscopy;  Laterality: N/A;  . HAND SURGERY    . ORIF ELBOW FRACTURE Right 06/24/2019   Procedure: OPEN REDUCTION INTERNAL FIXATION (ORIF) ELBOW/OLECRANON FRACTURE;  Surgeon: Christena Flake, MD;  Location: [facility] ORS;  Service: Orthopedics;  Laterality: Right;    Family History  Problem Relation Age of Onset  . Diabetes Mother   . Hypertension Mother   . Stroke Sister   . Hashimoto's thyroiditis Daughter   . Stroke Father   . Stroke Sister   . Cancer Neg Hx   . Breast cancer Neg Hx     Social History   Socioeconomic History  . Marital status: Married    Spouse name: Not on file  . Number of children: 2  . Years of education: Not on file  . Highest education level: Not on file  Occupational History  . Occupation: formerly Designer, jewellery for [employer]  . Smoking status: Never Smoker  . Smokeless tobacco: Never Used  Vaping Use  . Vaping Use: Never used  Substance and Sexual Activity  . Alcohol use: No  . Drug use: No  . Sexual activity: Not on file  Other Topics Concern  . Not on file  Social History Narrative   Has living will   Husband is health care POA. Alternate is daughter Diana Roberson   Would accept resuscitation attempts   Would not want feeding tube if cognitively unaware   Social Determinants of Health   Financial Resource Strain:   . Difficulty of Paying Living Expenses: Not on file  Food Insecurity:   . Worried About Programme researcher, broadcasting/film/video in the Last Year: Not on file  . Ran Out of Food in the Last Year: Not on file  Transportation Needs:   . Lack of Transportation (Medical): Not on file  . Lack of Transportation (Non-Medical): Not on file  Physical Activity:   . Days of Exercise per Week: Not on file  . Minutes of Exercise per Session: Not on file  Stress:   . Feeling of Stress : Not on file  Social Connections:   . Frequency of Communication with Friends and Family: Not on file  . Frequency of Social Gatherings with Friends and Family: Not on file  . Attends Religious Services: Not on file  . Active Member of Clubs or Organizations: Not on file  . Attends Banker Meetings: Not on file  .  Marital Status: Not on file  Intimate Partner Violence:   . Fear of Current or Ex-Partner: Not on file  . Emotionally Abused: Not on file  . Physically Abused: Not on file  . Sexually Abused: Not on file   Review of Systems Appetite and diet are good Weight is stable    Objective:  Physical Exam Constitutional:      Appearance: Normal appearance.  Cardiovascular:     Rate and Rhythm: Normal rate and regular rhythm.     Pulses: Normal pulses.     Heart sounds: No murmur heard.  No gallop.   Pulmonary:     Effort: Pulmonary effort is normal.     Breath sounds: Normal breath sounds. No wheezing or rales.  Musculoskeletal:     Cervical back: Neck supple.     Right lower leg: No edema.     Left lower leg: No edema.     Comments: Typical RA hand deformities---but not acute synovitis  Lymphadenopathy:     Cervical: No cervical adenopathy.  Neurological:     Mental Status: She is alert.  Psychiatric:        Mood and Affect: Mood normal.        Behavior: Behavior normal.            Assessment & Plan:

## 2020-06-16 NOTE — Assessment & Plan Note (Signed)
BP Readings from Last 3 Encounters:  06/16/20 128/80  01/15/20 140/88  07/17/19 124/80   Good control on low dose lisinopril

## 2020-07-13 DIAGNOSIS — M5442 Lumbago with sciatica, left side: Secondary | ICD-10-CM | POA: Diagnosis not present

## 2020-07-13 DIAGNOSIS — G8929 Other chronic pain: Secondary | ICD-10-CM | POA: Diagnosis not present

## 2020-07-13 DIAGNOSIS — M48061 Spinal stenosis, lumbar region without neurogenic claudication: Secondary | ICD-10-CM | POA: Diagnosis not present

## 2020-07-16 DIAGNOSIS — M48061 Spinal stenosis, lumbar region without neurogenic claudication: Secondary | ICD-10-CM | POA: Diagnosis not present

## 2020-07-16 DIAGNOSIS — M5442 Lumbago with sciatica, left side: Secondary | ICD-10-CM | POA: Diagnosis not present

## 2020-07-16 DIAGNOSIS — E119 Type 2 diabetes mellitus without complications: Secondary | ICD-10-CM | POA: Diagnosis not present

## 2020-07-19 ENCOUNTER — Ambulatory Visit (INDEPENDENT_AMBULATORY_CARE_PROVIDER_SITE_OTHER): Payer: Medicare Other | Admitting: Internal Medicine

## 2020-07-19 ENCOUNTER — Other Ambulatory Visit: Payer: Self-pay

## 2020-07-19 ENCOUNTER — Encounter: Payer: Self-pay | Admitting: Internal Medicine

## 2020-07-19 VITALS — BP 122/72 | HR 74 | Temp 97.2°F | Resp 16 | Ht 64.0 in | Wt 130.8 lb

## 2020-07-19 DIAGNOSIS — N3281 Overactive bladder: Secondary | ICD-10-CM | POA: Insufficient documentation

## 2020-07-19 DIAGNOSIS — M059 Rheumatoid arthritis with rheumatoid factor, unspecified: Secondary | ICD-10-CM

## 2020-07-19 DIAGNOSIS — G8929 Other chronic pain: Secondary | ICD-10-CM

## 2020-07-19 DIAGNOSIS — I10 Essential (primary) hypertension: Secondary | ICD-10-CM

## 2020-07-19 DIAGNOSIS — Z0000 Encounter for general adult medical examination without abnormal findings: Secondary | ICD-10-CM | POA: Diagnosis not present

## 2020-07-19 DIAGNOSIS — M549 Dorsalgia, unspecified: Secondary | ICD-10-CM

## 2020-07-19 DIAGNOSIS — Z7189 Other specified counseling: Secondary | ICD-10-CM

## 2020-07-19 DIAGNOSIS — K219 Gastro-esophageal reflux disease without esophagitis: Secondary | ICD-10-CM

## 2020-07-19 DIAGNOSIS — E1159 Type 2 diabetes mellitus with other circulatory complications: Secondary | ICD-10-CM

## 2020-07-19 DIAGNOSIS — F3174 Bipolar disorder, in full remission, most recent episode manic: Secondary | ICD-10-CM | POA: Diagnosis not present

## 2020-07-19 MED ORDER — MIRABEGRON ER 25 MG PO TB24: 25.0000 mg | ORAL_TABLET | Freq: Every day | ORAL | 3 refills | Status: DC

## 2020-07-19 NOTE — Assessment & Plan Note (Signed)
Okay on famotidine 

## 2020-07-19 NOTE — Assessment & Plan Note (Signed)
See social history 

## 2020-07-19 NOTE — Assessment & Plan Note (Signed)
BP Readings from Last 3 Encounters:  07/19/20 122/72  06/16/20 128/80  01/15/20 140/88   Good control on lisinopril

## 2020-07-19 NOTE — Assessment & Plan Note (Signed)
Will try myrbetriq

## 2020-07-19 NOTE — Assessment & Plan Note (Signed)
Lab Results  Component Value Date   HGBA1C 7.7 (A) 06/16/2020   Good control for her on metformin HTN controlled as well

## 2020-07-19 NOTE — Assessment & Plan Note (Signed)
I have personally reviewed the Medicare Annual Wellness questionnaire and have noted 1. The patient's medical and social history 2. Their use of alcohol, tobacco or illicit drugs 3. Their current medications and supplements 4. The patient's functional ability including ADL's, fall risks, home safety risks and hearing or visual             impairment. 5. Diet and physical activities 6. Evidence for depression or mood disorders  The patients weight, height, BMI and visual acuity have been recorded in the chart I have made referrals, counseling and provided education to the patient based review of the above and I have provided the pt with a written personalized care plan for preventive services.  I have provided you with a copy of your personalized plan for preventive services. Please take the time to review along with your updated medication list.  Prefers no COVID or flu vaccines Done with screening colons Should have at least one last screening mammogram--she will set up Td if any injury Discussed resistance training

## 2020-07-19 NOTE — Assessment & Plan Note (Signed)
Still working with the physiatrist

## 2020-07-19 NOTE — Progress Notes (Signed)
Subjective:    Patient ID: Diana Roberson, female    DOB: [birth year], 74 y.o.   MRN: [id number]  HPI Here for Medicare wellness visit and follow up of chronic health conditions This visit occurred during the SARS-CoV-2 public health emergency.  Safety protocols were in place, including screening questions prior to the visit, additional usage of staff PPE, and extensive cleaning of exam room while observing appropriate contact time as indicated for disinfecting solutions.   Reviewed advanced directives Reviewed other doctors---Dr Morales--PM&R, Dr Sallyanne Havers, Dr Josie Saunders, Dr Dodd-dentist Vision is fine. Hearing is good Larey Seat more than once---no injuries Chronic mood issues --controlled Does some balance exercises only Husband does instrumental ADLs--she does her own ADLs No alcohol or tobacco Some mild issues with memory--nothing worrisome  Having more problems with her bladder "It just doesn't work right" Has urgency and frequency. No pain or blood Affects her ability to go out  Reviewed the diabetes  Checks daily--- had been up after the cortisone injection Came back down after a couple of days Usually 150 fasting  Still seeing Dr Mariah Milling about the pain Done with cortisone shots for now  No mania or depression  Same medications  MTX controlling the RA Mostly in hands Takes folic acid also  No chest pain No SOB No dizziness or syncope Slight edema  Current Outpatient Medications on File Prior to Visit  Medication Sig Dispense Refill  . aspirin EC 81 MG tablet Take 81 mg by mouth daily.    Marland Kitchen atorvastatin (LIPITOR) 20 MG tablet TAKE 1 TABLET BY MOUTH  DAILY 90 tablet 0  . brimonidine (ALPHAGAN) 0.2 % ophthalmic solution Place 1 drop into both eyes 2 (two) times daily.    . calcium gluconate 500 MG tablet Take 1 tablet by mouth 2 (two) times daily.     . cholecalciferol (VITAMIN D) 1000 UNITS tablet Take 1,000 Units by mouth daily.    .  cyclobenzaprine (FLEXERIL) 10 MG tablet TAKE 1 TABLET BY MOUTH 3  TIMES DAILY AS NEEDED FOR  MUSCLE SPASM(S) 180 tablet 0  . dorzolamide-timolol (COSOPT) 22.3-6.8 MG/ML ophthalmic solution Place 1 drop into both eyes 2 (two) times daily.    . famotidine (PEPCID) 20 MG tablet Take 1 tablet (20 mg total) by mouth daily. In am 90 tablet 3  . folic acid (FOLVITE) 1 MG tablet Take 1 tablet (1 mg total) by mouth daily. 90 tablet 3  . glucose blood (ONETOUCH VERIO) test strip Use to check blood sugar once a day. Dx Code E11.9 100 each 3  . lisinopril (ZESTRIL) 10 MG tablet Take 1 tablet (10 mg total) by mouth daily. 90 tablet 3  . metFORMIN (GLUCOPHAGE) 500 MG tablet Take 2 tablets (1,000 mg total) by mouth 2 (two) times daily with a meal. 360 tablet 3  . methotrexate (RHEUMATREX) 2.5 MG tablet Take 7 tablets (17.5 mg total) by mouth once a week. Caution:Chemotherapy. Protect from light. 1 tablet 0  . OneTouch Delica Lancets 33G MISC 1 each by Does not apply route daily. Dx Code E11.9 100 each 3  . QUEtiapine (SEROQUEL) 50 MG tablet TAKE 1 TABLET BY MOUTH AT  BEDTIME 90 tablet 0  . vitamin B-12 (CYANOCOBALAMIN) 1000 MCG tablet Take 1,000 mcg by mouth daily.      . vitamin E 400 UNIT capsule Take 400 Units by mouth daily.      . ziprasidone (GEODON) 80 MG capsule TAKE 1 TO 2 CAPSULES BY  MOUTH DAILY 180 capsule  3   No current facility-administered medications on file prior to visit.    Allergies  Allergen Reactions  . Codeine Sulfate Other (See Comments)    Indigestion and epigastric pain  . Sertraline Hcl Other (See Comments)    REACTION: Worse, ? irritable    Past Medical History:  Diagnosis Date  . Allergy   . Bipolar 1 disorder (HCC)   . Chronic back pain   . Diabetes mellitus   . Difficult intubation   . Diverticulitis   . GERD (gastroesophageal reflux disease)   . Glaucoma   . Hyperlipidemia   . Hypertension   . Mental health disorder    admitted x3  . Osteopenia   . Rheumatoid  arthritis [facility])     Past Surgical History:  Procedure Laterality Date  . APPENDECTOMY    . BACK SURGERY  1988  . BREAST CYST ASPIRATION     neg  . CHOLECYSTECTOMY    . COLONOSCOPY    . COLONOSCOPY WITH PROPOFOL N/A 11/27/2017   Procedure: COLONOSCOPY WITH PROPOFOL;  Surgeon: Christena Deem, MD;  Location: [facility] ENDOSCOPY;  Service: Endoscopy;  Laterality: N/A;  . HAND SURGERY    . ORIF ELBOW FRACTURE Right 06/24/2019   Procedure: OPEN REDUCTION INTERNAL FIXATION (ORIF) ELBOW/OLECRANON FRACTURE;  Surgeon: Christena Flake, MD;  Location: [facility] ORS;  Service: Orthopedics;  Laterality: Right;    Family History  Problem Relation Age of Onset  . Diabetes Mother   . Hypertension Mother   . Stroke Sister   . Hashimoto's thyroiditis Daughter   . Stroke Father   . Stroke Sister   . Cancer Neg Hx   . Breast cancer Neg Hx     Social History   Socioeconomic History  . Marital status: Married    Spouse name: Not on file  . Number of children: 2  . Years of education: Not on file  . Highest education level: Not on file  Occupational History  . Occupation: formerly Designer, jewellery for [employer]  . Smoking status: Never Smoker  . Smokeless tobacco: Never Used  Vaping Use  . Vaping Use: Never used  Substance and Sexual Activity  . Alcohol use: No  . Drug use: No  . Sexual activity: Not on file  Other Topics Concern  . Not on file  Social History Narrative   Has living will   Husband is health care POA. Alternate is daughter [name]   Would accept resuscitation attempts   Would not want feeding tube if cognitively unaware   Social Determinants of Health   Financial Resource Strain:   . Difficulty of Paying Living Expenses: Not on file  Food Insecurity:   . Worried About Programme researcher, broadcasting/film/video in the Last Year: Not on file  . Ran Out of Food in the Last Year: Not on file  Transportation Needs:   . Lack of Transportation (Medical): Not on file  . Lack  of Transportation (Non-Medical): Not on file  Physical Activity:   . Days of Exercise per Week: Not on file  . Minutes of Exercise per Session: Not on file  Stress:   . Feeling of Stress : Not on file  Social Connections:   . Frequency of Communication with Friends and Family: Not on file  . Frequency of Social Gatherings with Friends and Family: Not on file  . Attends Religious Services: Not on file  . Active Member of Clubs or Organizations: Not on file  .  Attends Banker Meetings: Not on file  . Marital Status: Not on file  Intimate Partner Violence:   . Fear of Current or Ex-Partner: Not on file  . Emotionally Abused: Not on file  . Physically Abused: Not on file  . Sexually Abused: Not on file   Review of Systems Appetite is good Weight fairly stable Not a great sleeper--nothing different Wears seat belt Teeth okay---keeps up with dentist No heartburn on the famotidine. No dysphagia Bowels are moving fine. No blood     Objective:   Physical Exam Constitutional:      Appearance: Normal appearance.  HENT:     Mouth/Throat:     Comments: No lesions Eyes:     Conjunctiva/sclera: Conjunctivae normal.     Pupils: Pupils are equal, round, and reactive to light.  Cardiovascular:     Rate and Rhythm: Normal rate and regular rhythm.     Pulses: Normal pulses.     Heart sounds: No murmur heard.  No gallop.   Pulmonary:     Effort: Pulmonary effort is normal.     Breath sounds: Normal breath sounds. No wheezing or rales.  Abdominal:     Palpations: Abdomen is soft.     Tenderness: There is no abdominal tenderness.  Musculoskeletal:     Cervical back: Neck supple.     Right lower leg: No edema.     Left lower leg: No edema.     Comments: Ulnar deviation in both hands and toe deformities right >left---but no inflammation  Lymphadenopathy:     Cervical: No cervical adenopathy.  Skin:    General: Skin is warm.     Findings: No rash.     Comments: No foot  lesions  Neurological:     Mental Status: She is alert and oriented to person, place, and time.     Comments: President--- "Liliane Channel, Obama" [id number] Recall 3/3  Psychiatric:        Mood and Affect: Mood normal.        Behavior: Behavior normal.            Assessment & Plan:

## 2020-07-19 NOTE — Progress Notes (Signed)
Hearing Screening   125Hz  250Hz  500Hz  1000Hz  2000Hz  3000Hz  4000Hz  6000Hz  8000Hz   Right ear:           Left ear:           Vision Screening Comments: Patient reports having vision screening by Dr. at [facility] in September 2021 of this year.  Will request records.

## 2020-07-19 NOTE — Patient Instructions (Signed)
Please set up your screening mammogram. 

## 2020-07-19 NOTE — Assessment & Plan Note (Signed)
Doing well on geodon and seroquel 2 antipsychotic regimen dates back to psychiatrist and has worked well for her

## 2020-07-19 NOTE — Assessment & Plan Note (Signed)
Has chronic changes from disease but no acute inflammation on the MTX

## 2020-07-20 LAB — CBC
HCT: 37.8 % (ref 36.0–46.0)
Hemoglobin: 12.6 g/dL (ref 12.0–15.0)
MCHC: 33.5 g/dL (ref 30.0–36.0)
MCV: 94.1 fl (ref 78.0–100.0)
Platelets: 283 10*3/uL (ref 150.0–400.0)
RBC: 4.02 Mil/uL (ref 3.87–5.11)
RDW: 14.9 % (ref 11.5–15.5)
WBC: 7.6 10*3/uL (ref 4.0–10.5)

## 2020-07-20 LAB — LIPID PANEL
Cholesterol: 112 mg/dL (ref 0–200)
HDL: 53 mg/dL (ref >39.00)
LDL Cholesterol: 42 mg/dL (ref 0–99)
NonHDL: 59.49
Total CHOL/HDL Ratio: 2
Triglycerides: 85 mg/dL (ref 0.0–149.0)
VLDL: 17 mg/dL (ref 0.0–40.0)

## 2020-07-20 LAB — COMPREHENSIVE METABOLIC PANEL
ALT: 32 U/L (ref 0–35)
AST: 29 U/L (ref 0–37)
Albumin: 4.2 g/dL (ref 3.5–5.2)
Alkaline Phosphatase: 112 U/L (ref 39–117)
BUN: 16 mg/dL (ref 6–23)
CO2: 29 mEq/L (ref 19–32)
Calcium: 10 mg/dL (ref 8.4–10.5)
Chloride: 100 mEq/L (ref 96–112)
Creatinine, Ser: 0.85 mg/dL (ref 0.40–1.20)
GFR: 67.03 mL/min (ref >60.00)
Glucose, Bld: 136 mg/dL — ABNORMAL HIGH (ref 70–99)
Potassium: 4.5 mEq/L (ref 3.5–5.1)
Sodium: 137 mEq/L (ref 135–145)
Total Bilirubin: 0.6 mg/dL (ref 0.2–1.2)
Total Protein: 7.3 g/dL (ref 6.0–8.3)

## 2020-07-20 LAB — T4, FREE: Free T4: 0.93 ng/dL (ref 0.60–1.60)

## 2020-07-21 ENCOUNTER — Other Ambulatory Visit: Payer: Self-pay | Admitting: Internal Medicine

## 2020-07-21 DIAGNOSIS — Z1231 Encounter for screening mammogram for malignant neoplasm of breast: Secondary | ICD-10-CM

## 2020-07-25 ENCOUNTER — Other Ambulatory Visit: Payer: Self-pay | Admitting: Primary Care

## 2020-07-27 NOTE — Telephone Encounter (Signed)
LAST APPOINTMENT DATE: 07/19/2020   NEXT APPOINTMENT DATE: 12/16/2020    LAST REFILL: 05/06/2020  QTY: #90 no rf

## 2020-08-03 DIAGNOSIS — M5442 Lumbago with sciatica, left side: Secondary | ICD-10-CM | POA: Diagnosis not present

## 2020-08-03 DIAGNOSIS — G8929 Other chronic pain: Secondary | ICD-10-CM | POA: Diagnosis not present

## 2020-08-03 DIAGNOSIS — M48061 Spinal stenosis, lumbar region without neurogenic claudication: Secondary | ICD-10-CM | POA: Diagnosis not present

## 2020-08-12 DIAGNOSIS — E1165 Type 2 diabetes mellitus with hyperglycemia: Secondary | ICD-10-CM | POA: Diagnosis not present

## 2020-08-12 DIAGNOSIS — M48061 Spinal stenosis, lumbar region without neurogenic claudication: Secondary | ICD-10-CM | POA: Diagnosis not present

## 2020-08-12 DIAGNOSIS — M5442 Lumbago with sciatica, left side: Secondary | ICD-10-CM | POA: Diagnosis not present

## 2020-08-22 ENCOUNTER — Other Ambulatory Visit: Payer: Self-pay

## 2020-08-22 ENCOUNTER — Encounter: Payer: Self-pay | Admitting: Emergency Medicine

## 2020-08-22 DIAGNOSIS — S01112A Laceration without foreign body of left eyelid and periocular area, initial encounter: Secondary | ICD-10-CM | POA: Insufficient documentation

## 2020-08-22 DIAGNOSIS — S060X0A Concussion without loss of consciousness, initial encounter: Secondary | ICD-10-CM | POA: Diagnosis not present

## 2020-08-22 DIAGNOSIS — Z7982 Long term (current) use of aspirin: Secondary | ICD-10-CM | POA: Insufficient documentation

## 2020-08-22 DIAGNOSIS — E119 Type 2 diabetes mellitus without complications: Secondary | ICD-10-CM | POA: Insufficient documentation

## 2020-08-22 DIAGNOSIS — M25532 Pain in left wrist: Secondary | ICD-10-CM | POA: Diagnosis not present

## 2020-08-22 DIAGNOSIS — J320 Chronic maxillary sinusitis: Secondary | ICD-10-CM | POA: Diagnosis not present

## 2020-08-22 DIAGNOSIS — Y92000 Kitchen of unspecified non-institutional (private) residence as  the place of occurrence of the external cause: Secondary | ICD-10-CM | POA: Diagnosis not present

## 2020-08-22 DIAGNOSIS — R9431 Abnormal electrocardiogram [ECG] [EKG]: Secondary | ICD-10-CM | POA: Diagnosis not present

## 2020-08-22 DIAGNOSIS — Z79899 Other long term (current) drug therapy: Secondary | ICD-10-CM | POA: Diagnosis not present

## 2020-08-22 DIAGNOSIS — I10 Essential (primary) hypertension: Secondary | ICD-10-CM | POA: Diagnosis not present

## 2020-08-22 DIAGNOSIS — M7989 Other specified soft tissue disorders: Secondary | ICD-10-CM | POA: Diagnosis not present

## 2020-08-22 DIAGNOSIS — W01198A Fall on same level from slipping, tripping and stumbling with subsequent striking against other object, initial encounter: Secondary | ICD-10-CM | POA: Diagnosis not present

## 2020-08-22 DIAGNOSIS — S0990XA Unspecified injury of head, initial encounter: Secondary | ICD-10-CM | POA: Diagnosis not present

## 2020-08-22 DIAGNOSIS — Z7984 Long term (current) use of oral hypoglycemic drugs: Secondary | ICD-10-CM | POA: Diagnosis not present

## 2020-08-22 DIAGNOSIS — S0993XA Unspecified injury of face, initial encounter: Secondary | ICD-10-CM | POA: Diagnosis not present

## 2020-08-22 DIAGNOSIS — S0181XA Laceration without foreign body of other part of head, initial encounter: Secondary | ICD-10-CM | POA: Diagnosis not present

## 2020-08-22 NOTE — ED Triage Notes (Addendum)
Patient states that she slipped and fell. Patient with laceration beside her left eye, bleeding controlled. Patient denies LOC. Patient states that she takes asa daily. Patient with complaint of left wrist pain.

## 2020-08-23 ENCOUNTER — Emergency Department: Payer: Medicare Other

## 2020-08-23 ENCOUNTER — Emergency Department
Admission: EM | Admit: 2020-08-23 | Discharge: 2020-08-23 | Disposition: A | Discharge status: Home / Self Care | Payer: Medicare Other | Attending: Emergency Medicine | Admitting: Emergency Medicine

## 2020-08-23 DIAGNOSIS — M7989 Other specified soft tissue disorders: Secondary | ICD-10-CM | POA: Diagnosis not present

## 2020-08-23 DIAGNOSIS — J320 Chronic maxillary sinusitis: Secondary | ICD-10-CM | POA: Diagnosis not present

## 2020-08-23 DIAGNOSIS — S0990XA Unspecified injury of head, initial encounter: Secondary | ICD-10-CM

## 2020-08-23 DIAGNOSIS — S0993XA Unspecified injury of face, initial encounter: Secondary | ICD-10-CM | POA: Diagnosis not present

## 2020-08-23 DIAGNOSIS — S0181XA Laceration without foreign body of other part of head, initial encounter: Secondary | ICD-10-CM | POA: Diagnosis not present

## 2020-08-23 DIAGNOSIS — M25532 Pain in left wrist: Secondary | ICD-10-CM | POA: Diagnosis not present

## 2020-08-23 DIAGNOSIS — S060X0A Concussion without loss of consciousness, initial encounter: Secondary | ICD-10-CM

## 2020-08-23 DIAGNOSIS — W19XXXA Unspecified fall, initial encounter: Secondary | ICD-10-CM

## 2020-08-23 NOTE — ED Provider Notes (Signed)
[facility] Emergency Department Provider Note   ____________________________________________   Event Date/Time   First MD Initiated Contact with Patient 08/23/20 0104     (approximate)  I have reviewed the triage vital signs and the nursing notes.   HISTORY  Chief Complaint Fall    HPI Diana Roberson is a 74 y.o. female with a past medical history of rheumatoid arthritis, hypertension, type 2 diabetes, and bipolar disorder who presents after a mechanical fall from standing and suffering a head injury with a small laceration to the lateral aspect of left eye.  Patient denies any blood thinning medications but states that she does take aspirin 81 every day.  Patient states that she was holding a container of grapes when she slipped on the floor began to fall she attempted to drop these grapes and catch her self as she was falling but ended up hitting the side of her head on the dishwasher in the kitchen.  Patient also complains of aching, nonradiating, 6/10 left wrist pain that is worse with movement or palpation.  Patient denies any loss of consciousness but has vague memories of the event.  Patient currently denies any vision changes, tinnitus, difficulty speaking, facial droop, sore throat, chest pain, shortness of breath, abdominal pain, nausea/vomiting/diarrhea, dysuria, or weakness/numbness/paresthesias in any extremity         Past Medical History:  Diagnosis Date  . Allergy   . Bipolar 1 disorder (HCC)   . Chronic back pain   . Diabetes mellitus   . Difficult intubation   . Diverticulitis   . GERD (gastroesophageal reflux disease)   . Glaucoma   . Hyperlipidemia   . Hypertension   . Mental health disorder    admitted x3  . Osteopenia   . Rheumatoid arthritis [facility])     Patient Active Problem List   Diagnosis Date Noted  . OAB (overactive bladder) 07/19/2020  . Essential hypertension, benign 01/15/2020  . Plantar wart of left foot  01/13/2019  . Gastroesophageal reflux disease 11/01/2017  . Type 2 diabetes mellitus with circulatory disorder (HCC) 12/28/2015  . Seropositive rheumatoid arthritis (HCC) 12/28/2015  . Glaucoma 04/30/2015  . Advance directive discussed with patient 04/30/2015  . Routine general medical examination at a health care facility 12/06/2012  . CAROTID BRUIT 11/08/2010  . Bipolar 1 disorder, manic, full remission (HCC) 10/01/2009  . OSTEOPENIA 07/26/2007  . Hyperlipemia 01/22/2007  . DIVERTICULOSIS, COLON 01/08/2007  . Chronic back pain 01/08/2007    Past Surgical History:  Procedure Laterality Date  . APPENDECTOMY    . BACK SURGERY  1988  . BREAST CYST ASPIRATION     neg  . CHOLECYSTECTOMY    . COLONOSCOPY    . COLONOSCOPY WITH PROPOFOL N/A 11/27/2017   Procedure: COLONOSCOPY WITH PROPOFOL;  Surgeon: Christena Deem, MD;  Location: [facility] ENDOSCOPY;  Service: Endoscopy;  Laterality: N/A;  . HAND SURGERY    . ORIF ELBOW FRACTURE Right 06/24/2019   Procedure: OPEN REDUCTION INTERNAL FIXATION (ORIF) ELBOW/OLECRANON FRACTURE;  Surgeon: Christena Flake, MD;  Location: [facility] ORS;  Service: Orthopedics;  Laterality: Right;    Prior to Admission medications   Medication Sig Start Date End Date Taking? Authorizing Provider  aspirin EC 81 MG tablet Take 81 mg by mouth daily.    [provider]  atorvastatin (LIPITOR) 20 MG tablet TAKE 1 TABLET BY MOUTH  DAILY 05/06/20   Doreene Nest, NP  brimonidine (ALPHAGAN) 0.2 % ophthalmic solution Place 1  drop into both eyes 2 (two) times daily.    [provider]  calcium gluconate 500 MG tablet Take 1 tablet by mouth 2 (two) times daily.     [provider]  cholecalciferol (VITAMIN D) 1000 UNITS tablet Take 1,000 Units by mouth daily.    [provider]  cyclobenzaprine (FLEXERIL) 10 MG tablet TAKE 1 TABLET BY MOUTH 3  TIMES DAILY AS NEEDED FOR  MUSCLE SPASM(S) 02/27/20   Karie Schwalbe, MD  dorzolamide-timolol  (COSOPT) 22.3-6.8 MG/ML ophthalmic solution Place 1 drop into both eyes 2 (two) times daily.    [provider]  famotidine (PEPCID) 20 MG tablet Take 1 tablet (20 mg total) by mouth daily. In am 06/15/20   Karie Schwalbe, MD  folic acid (FOLVITE) 1 MG tablet Take 1 tablet (1 mg total) by mouth daily. 01/07/18   Tillman Abide I, MD  glucose blood (ONETOUCH VERIO) test strip Use to check blood sugar once a day. Dx Code E11.9 01/15/19   Tillman Abide I, MD  lisinopril (ZESTRIL) 10 MG tablet Take 1 tablet (10 mg total) by mouth daily. 02/12/20   Karie Schwalbe, MD  metFORMIN (GLUCOPHAGE) 500 MG tablet Take 2 tablets (1,000 mg total) by mouth 2 (two) times daily with a meal. 04/30/20   Karie Schwalbe, MD  methotrexate (RHEUMATREX) 2.5 MG tablet Take 7 tablets (17.5 mg total) by mouth once a week. Caution:Chemotherapy. Protect from light. 06/16/20   Karie Schwalbe, MD  mirabegron ER (MYRBETRIQ) 25 MG TB24 tablet Take 1 tablet (25 mg total) by mouth daily. 07/19/20   Karie Schwalbe, MD  OneTouch Delica Lancets 33G MISC 1 each by Does not apply route daily. Dx Code E11.9 01/15/19   Karie Schwalbe, MD  QUEtiapine (SEROQUEL) 50 MG tablet TAKE 1 TABLET BY MOUTH AT  BEDTIME 07/28/20   Karie Schwalbe, MD  vitamin B-12 (CYANOCOBALAMIN) 1000 MCG tablet Take 1,000 mcg by mouth daily.      [provider]  vitamin E 400 UNIT capsule Take 400 Units by mouth daily.      [provider]  ziprasidone (GEODON) 80 MG capsule TAKE 1 TO 2 CAPSULES BY  MOUTH DAILY 06/30/19   Karie Schwalbe, MD    Allergies Codeine sulfate and Sertraline hcl  Family History  Problem Relation Age of Onset  . Diabetes Mother   . Hypertension Mother   . Stroke Sister   . Hashimoto's thyroiditis Daughter   . Stroke Father   . Stroke Sister   . Cancer Neg Hx   . Breast cancer Neg Hx     Social History Social History   Tobacco Use  . Smoking status: Never Smoker  . Smokeless tobacco:  Never Used  Vaping Use  . Vaping Use: Never used  Substance Use Topics  . Alcohol use: No  . Drug use: No    Review of Systems Constitutional: No fever/chills Eyes: No visual changes. ENT: No sore throat. Cardiovascular: Denies chest pain. Respiratory: Denies shortness of breath. Gastrointestinal: No abdominal pain.  No nausea, no vomiting.  No diarrhea. Genitourinary: Negative for dysuria. Musculoskeletal: Negative for acute arthralgias Skin: Negative for rash.  Left eye laceration Neurological: Positive for headache, negative for weakness/numbness/paresthesias in any extremity Psychiatric: Negative for suicidal ideation/homicidal ideation   ____________________________________________   PHYSICAL EXAM:  VITAL SIGNS: ED Triage Vitals  Enc Vitals Group     BP 08/22/20 2349 (!) 161/86     Pulse  Rate 08/22/20 2349 (!) 105     Resp 08/22/20 2349 17     Temp 08/22/20 2349 99.1 F (37.3 C)     Temp src --      SpO2 08/22/20 2349 97 %     Weight 08/22/20 2349 130 lb (59 kg)     Height 08/22/20 2349 5\' 4"  (1.626 m)     Head Circumference --      Peak Flow --      Pain Score 08/22/20 2355 4     Pain Loc --      Pain Edu? --      Excl. in GC? --    Constitutional: Alert and oriented. Well appearing and in no acute distress. Eyes: Conjunctivae are normal. PERRL. Head: Bruising to the lateral left periorbital region with laceration Nose: No congestion/rhinnorhea. Mouth/Throat: Mucous membranes are moist. Neck: No stridor Cardiovascular: Grossly normal heart sounds.  Good peripheral circulation. Respiratory: Normal respiratory effort.  No retractions. Gastrointestinal: Soft and nontender. No distention. Musculoskeletal: No obvious deformities Neurologic:  Normal speech and language. No gross focal neurologic deficits are appreciated. Skin:  Skin is warm and dry. No rash noted.  2 cm vertical linear laceration to the lateral aspect of the left eye that is hemostatic and has  surrounding ecchymosis Psychiatric: Mood and affect are normal. Speech and behavior are normal.  ____________________________________________   LABS (all labs ordered are listed, but only abnormal results are displayed)  Labs Reviewed - No data to display RADIOLOGY  ED MD interpretation: Three-view x-ray of the left wrist does not show any evidence of acute fractures or dislocations  CT of the head without contrast shows no evidence of acute intracranial hemorrhage, edema, mass-effect, or fractures of the calvarium  CT of the maxillofacial structures without contrast shows no evidence of acute abnormalities including no acute fractures or dislocations.  Of note there is some mild left preseptal and malar soft tissue swelling  Official radiology report(s): DG Wrist Complete Left  Result Date: 08/23/2020 CLINICAL DATA:  Fall, left wrist pain EXAM: LEFT WRIST - COMPLETE 3+ VIEW COMPARISON:  None. FINDINGS: Four view radiograph left wrist demonstrates normal alignment. No fracture or dislocation. Joint spaces are preserved. Mild soft tissue swelling dorsal lateral to the distal left forearm. IMPRESSION: Soft tissue swelling.  No fracture or dislocation Electronically Signed   By: 08/25/2020 MD   On: 08/23/2020 00:35   CT Head Wo Contrast  Result Date: 08/23/2020 CLINICAL DATA:  Fall, facial trauma EXAM: CT HEAD WITHOUT CONTRAST TECHNIQUE: Contiguous axial images were obtained from the base of the skull through the vertex without intravenous contrast. COMPARISON:  None. FINDINGS: Brain: There is atrophy and chronic small vessel disease changes. No acute intracranial abnormality. Specifically, no hemorrhage, hydrocephalus, mass lesion, acute infarction, or significant intracranial injury. Vascular: No hyperdense vessel or unexpected calcification. Skull: No acute calvarial abnormality. Sinuses/Orbits: No acute findings Other: None IMPRESSION: Atrophy, chronic microvascular disease. No acute  intracranial abnormality. Electronically Signed   By: 08/25/2020 M.D.   On: 08/23/2020 00:19   CT Maxillofacial Wo Contrast  Result Date: 08/23/2020 CLINICAL DATA:  Fall, left facial laceration EXAM: CT MAXILLOFACIAL WITHOUT CONTRAST TECHNIQUE: Multidetector CT imaging of the maxillofacial structures was performed. Multiplanar CT image reconstructions were also generated. COMPARISON:  None. FINDINGS: Osseous: No acute facial fracture. Degenerative changes noted within the temporomandibular joints bilaterally. Streak artifact related to dental hardware is noted. Small periapical abscesses are seen surrounding the roots of the anterior  left maxillary molar (tooth 14). Orbits: Mild left preseptal soft tissue swelling. The orbital contents are unremarkable. Sinuses: Minimal mucosal thickening within the maxillary sinuses bilaterally. The paranasal sinuses are otherwise unremarkable. Soft tissues: Minimal left malar subcutaneous soft tissue swelling. Limited intracranial: Age-appropriate parenchymal volume loss. No acute abnormality. IMPRESSION: Mild left preseptal and malar soft tissue swelling. No acute facial fracture. Electronically Signed   By: Helyn Numbers MD   On: 08/23/2020 00:33    ____________________________________________   PROCEDURES  Procedure(s) performed (including Critical Care):  .1-3 Lead EKG Interpretation Performed by: Merwyn Katos, MD Authorized by: Merwyn Katos, MD     Interpretation: normal     ECG rate:  92   ECG rate assessment: normal     Rhythm: sinus rhythm     Ectopy: none     Conduction: normal   ..Laceration Repair  Date/Time: 08/23/2020 2:02 AM Performed by: Merwyn Katos, MD Authorized by: Merwyn Katos, MD   Consent:    Consent obtained:  Verbal   Consent given by:  Patient   Risks, benefits, and alternatives were discussed: yes     Risks discussed:  Pain, poor cosmetic result, poor wound healing and need for additional repair    Alternatives discussed:  No treatment Universal protocol:    Procedure explained and questions answered to patient or proxy's satisfaction: yes     Test results available: yes     Imaging studies available: yes     Patient identity confirmed:  Verbally with patient Anesthesia:    Anesthesia method:  None Laceration details:    Location:  Face   Face location:  L eyebrow   Length (cm):  2   Depth (mm):  3 Pre-procedure details:    Preparation:  Patient was prepped and draped in usual sterile fashion and imaging obtained to evaluate for foreign bodies Exploration:    Limited defect created (wound extended): no     Imaging obtained: x-ray     Imaging outcome: foreign body not noted     Wound exploration: entire depth of wound visualized     Wound extent: no fascia violation noted, no foreign bodies/material noted, no muscle damage noted, no nerve damage noted, no tendon damage noted, no underlying fracture noted and no vascular damage noted     Contaminated: no   Treatment:    Area cleansed with:  Povidone-iodine   Amount of cleaning:  Standard   Irrigation solution:  Sterile saline   Irrigation volume:  200   Irrigation method:  Syringe   Visualized foreign bodies/material removed: no     Debridement:  None   Undermining:  None   Scar revision: no   Skin repair:    Repair method:  Steri-Strips   Number of Steri-Strips:  3 Approximation:    Approximation:  Close Repair type:    Repair type:  Simple Post-procedure details:    Dressing:  Open (no dressing)   Procedure completion:  Tolerated     ____________________________________________   INITIAL IMPRESSION / ASSESSMENT AND PLAN / ED COURSE  As part of my medical decision making, I reviewed the following data within the electronic MEDICAL RECORD NUMBER Nursing notes reviewed and incorporated, Labs reviewed, Old chart reviewed, Radiograph reviewed and Notes from prior ED visits reviewed and incorporated        This  74 year old female presents with head trauma after a mechanical GLF. DDX includes MSK trauma, facial fractures, ICH or traumatic SAH, C-spine injury. Doubt  other extracranial causes of injury. Considered nonmechanical causes of fall such as syncope, primary cardiopulmonary etiologies such as ACS/PE, but think these are unlikely. Will get head/face/neck CT, pain control, laceration repair, basic labs, reassess, discharge      ____________________________________________   FINAL CLINICAL IMPRESSION(S) / ED DIAGNOSES  Final diagnoses:  Fall, initial encounter  Injury of head, initial encounter  Laceration of periorbital area, initial encounter  Concussion without loss of consciousness, initial encounter     ED Discharge Orders    None       Note:  This document was prepared using Dragon voice recognition software and may include unintentional dictation errors.   Merwyn Katos, MD 08/23/20 [phone]

## 2020-08-24 ENCOUNTER — Telehealth: Payer: Self-pay

## 2020-08-24 DIAGNOSIS — G8929 Other chronic pain: Secondary | ICD-10-CM | POA: Diagnosis not present

## 2020-08-24 DIAGNOSIS — M5442 Lumbago with sciatica, left side: Secondary | ICD-10-CM | POA: Diagnosis not present

## 2020-08-24 NOTE — Telephone Encounter (Signed)
Spoke to husband per DPR. Fell in kitchen up against [employer]. They used steri-strips to close it. Very bruised on her face today.

## 2020-09-01 ENCOUNTER — Other Ambulatory Visit: Payer: Self-pay | Admitting: Primary Care

## 2020-09-21 ENCOUNTER — Telehealth: Payer: Self-pay

## 2020-09-21 MED ORDER — METHOTREXATE 2.5 MG PO TABS: 17.5000 mg | ORAL_TABLET | ORAL | 3 refills | Status: AC

## 2020-09-21 NOTE — Telephone Encounter (Signed)
Pharmacy requests refill on: Methotrexate 2.5 mg   LAST REFILL: 06/16/2020 LAST OV: 07/19/2020 NEXT OV: 12/16/2020 PHARMACY: [facility]

## 2020-09-22 ENCOUNTER — Telehealth: Payer: Self-pay

## 2020-09-22 NOTE — Chronic Care Management (AMB) (Addendum)
Chronic Care Management Pharmacy Assistant   Name: Diana Roberson  MRN: [id number] DOB: 11-Jul-1946  Reason for Encounter: Diabetes/Hypertension Medication Review   Patient Questions:  1.  Have you seen any other providers since your last visit? Yes 12/14/21Filomena Jungling, MD- Physical Medicine. 08/23/20- ED visit. Fall 08/12/20- Filomena Jungling, MD- Physical Medicine- [facility] 08/03/20- Filomena Jungling, MD - Physical medicine 07/19/20- Dr. Tillman Abide- PCP- 07/16/20- Filomena Jungling, MD- Physical Medicine- [facility] 07/13/20- Filomena Jungling, MD- Physical Medicine 06/16/20- Dr. Alphonsus Sias- PCP 06/15/20- Dr. Gwyneth Revels- Podiatry 05/07/20- Dr. Lockie Mola- ophthalmology 04/23/20- Dr. Lockie Mola- ophthalmology   2.  Any changes in your medicines or health? Yes 08/24/21 Dr. Mariah Milling started patient on gabapentin- to increase every 4 days up to 3 tablets daily. 07/19/20- Dr. Alphonsus Sias added [facility] 06/16/20- Dr. Alphonsus Sias increased methotrexate from 15 mg weekly to 17.5 mg weekly.    PCP : Karie Schwalbe, MD  Allergies:   Allergies  Allergen Reactions   Codeine Sulfate Other (See Comments)    Indigestion and epigastric pain   Sertraline Hcl Other (See Comments)    REACTION: Worse, ? irritable    Medications: Outpatient Encounter Medications as of 09/22/2020  Medication Sig   aspirin EC 81 MG tablet Take 81 mg by mouth daily.   atorvastatin (LIPITOR) 20 MG tablet TAKE 1 TABLET BY MOUTH  DAILY   brimonidine (ALPHAGAN) 0.2 % ophthalmic solution Place 1 drop into both eyes 2 (two) times daily.   calcium gluconate 500 MG tablet Take 1 tablet by mouth 2 (two) times daily.    cholecalciferol (VITAMIN D) 1000 UNITS tablet Take 1,000 Units by mouth daily.   cyclobenzaprine (FLEXERIL) 10 MG tablet TAKE 1 TABLET BY MOUTH 3  TIMES DAILY AS NEEDED FOR  MUSCLE SPASM(S)   dorzolamide-timolol (COSOPT) 22.3-6.8 MG/ML ophthalmic solution Place 1 drop into both eyes 2 (two) times  daily.   famotidine (PEPCID) 20 MG tablet Take 1 tablet (20 mg total) by mouth daily. In am   folic acid (FOLVITE) 1 MG tablet Take 1 tablet (1 mg total) by mouth daily.   glucose blood (ONETOUCH VERIO) test strip Use to check blood sugar once a day. Dx Code E11.9   lisinopril (ZESTRIL) 10 MG tablet Take 1 tablet (10 mg total) by mouth daily.   metFORMIN (GLUCOPHAGE) 500 MG tablet Take 2 tablets (1,000 mg total) by mouth 2 (two) times daily with a meal.   methotrexate (RHEUMATREX) 2.5 MG tablet Take 7 tablets (17.5 mg total) by mouth once a week. Caution:Chemotherapy. Protect from light.   mirabegron ER (MYRBETRIQ) 25 MG TB24 tablet Take 1 tablet (25 mg total) by mouth daily.   OneTouch Delica Lancets 33G MISC 1 each by Does not apply route daily. Dx Code E11.9   QUEtiapine (SEROQUEL) 50 MG tablet TAKE 1 TABLET BY MOUTH AT  BEDTIME   vitamin B-12 (CYANOCOBALAMIN) 1000 MCG tablet Take 1,000 mcg by mouth daily.     vitamin E 400 UNIT capsule Take 400 Units by mouth daily.     ziprasidone (GEODON) 80 MG capsule TAKE 1 TO 2 CAPSULES BY  MOUTH DAILY   No facility-administered encounter medications on file as of 09/22/2020.    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  Seropositive rheumatoid arthritis (HCC) 12/28/2015   Glaucoma 04/30/2015   Advance directive discussed with patient 04/30/2015   Routine general medical examination at a health care facility 12/06/2012   CAROTID BRUIT 11/08/2010   Bipolar 1 disorder, manic, full remission (HCC) 10/01/2009   OSTEOPENIA 07/26/2007   Hyperlipemia 01/22/2007   DIVERTICULOSIS, COLON 01/08/2007   Chronic back pain 01/08/2007   Recent Relevant Labs: Lab Results  Component Value  Date/Time   HGBA1C 7.7 (A) 06/16/2020 11:13 AM   HGBA1C 6.9 (A) 01/15/2020 02:35 PM   HGBA1C 7.2 (H) 01/14/2019 02:26 PM   HGBA1C 7.0 (H) 11/01/2017 04:48 PM   MICROALBUR 1.7 04/30/2015 12:43 PM   MICROALBUR 2.1 (H) 06/09/2013 12:54 PM    Kidney Function Lab Results  Component Value Date/Time   CREATININE 0.85 07/19/2020 04:38 PM   CREATININE 0.88 03/02/2020 11:09 AM   GFR 67.03 07/19/2020 04:38 PM   GFRNONAA 67 01/17/2016 03:45 PM   GFRAA 77 01/17/2016 03:45 PM    Current antihyperglycemic regimen:  Metformin 500 mg - Take 2 tablets twice daily  What recent interventions/DTPs have been made to improve glycemic control:  Patient states she is taking her husbands glimepiride 2 mg daily.  Have there been any recent hospitalizations or ED visits since last visit with CPP?  Yes - 08/23/20 due to a fall  Patient denies hypoglycemic symptoms, including Pale, Sweaty, Shaky, Hungry, Nervous/irritable and Vision changes   Patient denies hyperglycemic symptoms, including blurry vision, excessive thirst, fatigue, polyuria and weakness   How often are you checking your blood sugar? 3-4 times daily   What are your blood sugars ranging?  Fasting: 108- 09/24/20 Before meals:  N/A  After meals: 175, 118, 192, 240, 182 Bedtime:  N/A  During the week, how often does your blood glucose drop below 70? Never   Are you checking your feet daily/regularly? Yes, state feet look okay  Adherence Review: Is the patient currently on a STATIN medication? Yes Is the patient currently on ACE/ARB medication? Yes Does the patient have >5 day gap between last estimated fill dates? CPP to review  Reviewed chart prior to disease state call. Spoke with patient regarding BP  Recent Office Vitals: BP Readings from Last 3 Encounters:  08/23/20 (!) 148/67  07/19/20 122/72  06/16/20 128/80   Pulse Readings from Last 3 Encounters:  08/23/20 94  07/19/20 74  06/16/20 89    Wt Readings from Last 3  Encounters:  08/22/20 130 lb (59 kg)  07/19/20 130 lb 12.8 oz (59.3 kg)  06/16/20 131 lb (59.4 kg)     Kidney Function Lab Results  Component Value Date/Time   CREATININE 0.85 07/19/2020 04:38 PM   CREATININE 0.88 03/02/2020 11:09 AM   GFR 67.03 07/19/2020 04:38 PM   GFRNONAA 67 01/17/2016 03:45 PM   GFRAA 77 01/17/2016 03:45 PM    BMP Latest Ref Rng & Units 07/19/2020 03/02/2020 01/14/2019  Glucose 70 - 99 mg/dL 119(E) 174(Y) 814(G)  BUN 6 - 23 mg/dL 16 19 17   Creatinine 0.40 - 1.20 mg/dL 8.18 5.63  BUN/Creat Ratio 12 - 28 - - -  Sodium 135 - 145 mEq/L 137 140 139  Potassium 3.5 - 5.1 mEq/L 4.5 4.4 4.8  Chloride 96 - 112 mEq/L 100 106 106  CO2 19 - 32 mEq/L 29 28 27   Calcium 8.4 - 10.5 mg/dL 1.49 9.7 9.3    Current antihypertensive regimen:  Lisinopril 10 mg - 1 tablet daily (AM)   How often  are you checking your Blood Pressure? infrequently, states her machine broke and is working on getting a new one.    Current home BP readings: no readings at this time.   What recent interventions/DTPs have been made by any provider to improve Blood Pressure control since last CPP Visit: No changes or interventions.   What diet changes have been made to improve Blood Pressure Control?  No changes in diet  What exercise is being done to improve your Blood Pressure Control?  "Not very active"  Adherence Review: Is the patient currently on ACE/ARB medication? Yes Does the patient have >5 day gap between last estimated fill dates? CPP to review  Patient states she is doing fair. She has had multiple falls lately and has resorted to using a walker. She notes that her blood sugar has been running high so she started taking her husbands glimepiride 2 mg daily.   Follow-Up:  Pharmacist Review   Phil Dopp, CPP notified  Jomarie Longs, [facility] Clinical Pharmacy Assistant [phone]  I have reviewed the care management and care coordination activities outlined in this  encounter and I am certifying that I agree with the content of this note. Due to multiple concerns including elevated BG readings, will call patient 09/28/20 for in depth medication review.   Phil Dopp, PharmD Clinical Pharmacist [facility] [phone]

## 2020-09-28 ENCOUNTER — Ambulatory Visit: Payer: Self-pay

## 2020-09-28 DIAGNOSIS — I10 Essential (primary) hypertension: Secondary | ICD-10-CM

## 2020-09-28 DIAGNOSIS — E1159 Type 2 diabetes mellitus with other circulatory complications: Secondary | ICD-10-CM

## 2020-09-28 NOTE — Chronic Care Management (AMB) (Signed)
Chronic Care Management Pharmacy  Name: Diana Roberson  MRN: [id number] DOB: Jul 30, 1946  Chief Complaint/ HPI  Diana Roberson,  75 y.o., female presents for their Follow-Up CCM visit with the clinical pharmacist via telephone.  PCP : Diana Schwalbe, MD  Their chronic conditions include: type 2 diabetes, HTN, GERD, osteopenia, OAB, HLD, bipolar 1, glaucoma  Allergies  Allergen Reactions  . Codeine Sulfate Other (See Comments)    Indigestion and epigastric pain  . Sertraline Hcl Other (See Comments)    REACTION: Worse, ? irritable   Medications: Outpatient Encounter Medications as of 09/28/2020  Medication Sig  . aspirin EC 81 MG tablet Take 81 mg by mouth daily.  Marland Kitchen atorvastatin (LIPITOR) 20 MG tablet TAKE 1 TABLET BY MOUTH  DAILY  . brimonidine (ALPHAGAN) 0.2 % ophthalmic solution Place 1 drop into both eyes 2 (two) times daily.  . calcium gluconate 500 MG tablet Take 1 tablet by mouth 2 (two) times daily.   . cholecalciferol (VITAMIN D) 1000 UNITS tablet Take 1,000 Units by mouth daily.  . cyclobenzaprine (FLEXERIL) 10 MG tablet TAKE 1 TABLET BY MOUTH 3  TIMES DAILY AS NEEDED FOR  MUSCLE SPASM(S)  . dorzolamide-timolol (COSOPT) 22.3-6.8 MG/ML ophthalmic solution Place 1 drop into both eyes 2 (two) times daily.  . famotidine (PEPCID) 20 MG tablet Take 1 tablet (20 mg total) by mouth daily. In am  . folic acid (FOLVITE) 1 MG tablet Take 1 tablet (1 mg total) by mouth daily.  Marland Kitchen glucose blood (ONETOUCH VERIO) test strip Use to check blood sugar once a day. Dx Code E11.9  . lisinopril (ZESTRIL) 10 MG tablet Take 1 tablet (10 mg total) by mouth daily.  . metFORMIN (GLUCOPHAGE) 500 MG tablet Take 2 tablets (1,000 mg total) by mouth 2 (two) times daily with a meal.  . methotrexate (RHEUMATREX) 2.5 MG tablet Take 7 tablets (17.5 mg total) by mouth once a week. Caution:Chemotherapy. Protect from light.  . mirabegron ER (MYRBETRIQ) 25 MG TB24 tablet Take 1 tablet (25 mg total) by  mouth daily.  Letta Pate Delica Lancets 33G MISC 1 each by Does not apply route daily. Dx Code E11.9  . QUEtiapine (SEROQUEL) 50 MG tablet TAKE 1 TABLET BY MOUTH AT  BEDTIME  . vitamin B-12 (CYANOCOBALAMIN) 1000 MCG tablet Take 1,000 mcg by mouth daily.    . vitamin E 400 UNIT capsule Take 400 Units by mouth daily.    . ziprasidone (GEODON) 80 MG capsule TAKE 1 TO 2 CAPSULES BY  MOUTH DAILY   No facility-administered encounter medications on file as of 09/28/2020.   Current Diagnosis/Assessment: Goals    . Pharmacy Care Plan      CARE PLAN ENTRY (see longitudinal plan of care for additional care plan information)  Current Barriers:  . Chronic Disease Management support, education, and care coordination needs related to Hypertension and Diabetes   Hypertension BP Readings from Last 3 Encounters:  08/23/20 (!) 148/67  07/19/20 122/72  06/16/20 128/80   . Pharmacist Clinical Goal(s): o Over the next 30 days, patient will work with PharmD and providers to achieve BP goal <140/90 . Current regimen:  o Lisinopril 10 mg - 1 tablet daily  . Interventions: o Reviewed home BP monitoring - patient lost home monitor . Patient self care activities - Over the next 30 days, patient will: o Continue lisinopril 10 mg daily.  o Check blood pressure once daily before breakfast for next 4 weeks. Always rest for 5  minutes prior to checking blood pressure. Repeat check after 2-3 minutes if blood pressure is above 140/90.   Diabetes Lab Results  Component Value Date/Time   HGBA1C 7.7 (A) 06/16/2020 11:13 AM   HGBA1C 6.9 (A) 01/15/2020 02:35 PM   HGBA1C 7.2 (H) 01/14/2019 02:26 PM   HGBA1C 7.0 (H) 11/01/2017 04:48 PM   . Pharmacist Clinical Goal(s): o Over the next 30 days, patient will work with PharmD and providers to achieve A1c goal <7% . Current regimen:  o Metformin 500 mg - 2 tablets with breakfast and 2 tablets with evening meal  Interventions: o Reviewed home BG log o Recommend  starting glimepiride 2 mg daily - consult PCP . Patient self care activities - Over the next 30 days, patient will: . Continue current medications until further contact from clinic. Marland Kitchen Check blood glucose twice daily (before breakfast and 2 hours after first bite of largest meal of the day).  o Contact provider with any episodes of hypoglycemia  Please see past updates related to this goal by clicking on the "Past Updates" button in the selected goal        Diabetes   CMP Latest Ref Rng & Units 07/19/2020 03/02/2020 01/14/2019  Glucose 70 - 99 mg/dL 759(F) 638(G) 665(L)  BUN 6 - 23 mg/dL 16 19 17   Creatinine 0.40 - 1.20 mg/dL 9.35 7.01 7.79  Sodium 135 - 145 mEq/L 137 140 139  Potassium 3.5 - 5.1 mEq/L 4.5 4.4 4.8  Chloride 96 - 112 mEq/L 100 106 106  CO2 19 - 32 mEq/L 29 28 27   Calcium 8.4 - 10.5 mg/dL 39.0 9.7 9.3  Total Protein 6.0 - 8.3 g/dL 7.3 - 6.8  Total Bilirubin 0.2 - 1.2 mg/dL 0.6 - 0.4  Alkaline Phos 39 - 117 U/L 112 - 85  AST 0 - 37 U/L 29 - 16  ALT 0 - 35 U/L 32 - 14   Recent Relevant Labs: Lab Results  Component Value Date/Time   HGBA1C 7.7 (A) 06/16/2020 11:13 AM   HGBA1C 6.9 (A) 01/15/2020 02:35 PM   HGBA1C 7.2 (H) 01/14/2019 02:26 PM   HGBA1C 7.0 (H) 11/01/2017 04:48 PM   MICROALBUR 1.7 04/30/2015 12:43 PM   MICROALBUR 2.1 (H) 06/09/2013 12:54 PM     A1c goal < 7% Patient has failed these meds in past: none  Patient is currently uncontrolled on the following medications:   Metformin 500 mg - 2 tablets with breakfast and 2 tablets with evening meal   Update 09/28/20: Patient received 2 steroid injections in the past couple of months. She started taking her husbands glimepiride 2 mg once daily for the past 2-3 weeks and has noticed improvement in her BG. She would like to continue this as her numbers have improved and its inexpensive. She checks her BG at least once daily. Fasting last 3 days: 142, 130, 202  --> reports closer to 150 on average, denies  hypoglycemia (lowest 82) 2 hours after breakfast: 174  Plan: Continue current medications; Consult PCP to trial glimepiride 2 mg daily.   Hypertension   BMP Latest Ref Rng & Units 07/19/2020 03/02/2020 01/14/2019  Glucose 70 - 99 mg/dL 300(P) 233(A) 076(A)  BUN 6 - 23 mg/dL 16 19 17   Creatinine 0.40 - 1.20 mg/dL 2.63 3.35 4.56  BUN/Creat Ratio 12 - 28 - - -  Sodium 135 - 145 mEq/L 137 140 139  Potassium 3.5 - 5.1 mEq/L 4.5 4.4 4.8  Chloride 96 - 112 mEq/L 100  106 106  CO2 19 - 32 mEq/L 29 28 27   Calcium 8.4 - 10.5 mg/dL 9.7 9.3   Office blood pressures are: BP Readings from Last 3 Encounters:  08/23/20 (!) 148/67  07/19/20 122/72  06/16/20 128/80   BP goal  >140/90 mmHg Patient has failed these meds in the past: none Patient is currently uncontrolled on the following medications:   Lisinopril 10 mg - 1 tablet daily (AM)   Update 09/28/20: Patient continues taking daily. She has lost her BP monitor.   Plan: Continue current medications; Check BP daily for next 4 weeks.  Falls   Patient and husband report multiple falls recently. She is using a walker now.   Feels drowsy during the day, not dizzy. Feels groggy in the mornings. Does not nap.  Medication Fall Risk Review:  Myrbetriq 25 mg - 1 tablet daily PRN (using PRN due to cost $300 for 90 DS) - notices some improvement, only takes one about twice weekly   Cyclobenzaprine - may take 1 at bedtime if back pain/unable to sleep, taking about once a month  Quetiapine 50 mg - 1 tablet daily at bedtime   Denies any OTC sleep aids.  Discussed potential for the above medications to cause drowsiness during the day. She takes Myrbetriq PRN which I discouraged as its unlikely effective, but pt reports some benefit so will continue. Take cyclobenzaprine very sparingly. Could consider dose reduction of quetiapine. Pt has been on quetiapine and ziprasidone a long time and managed by Dr. 09/30/20.  Plan: Recommend scheduling PCP appt  due to frequent falls recently. Consider quetiapine dose reduction.  CCM Follow Up: 6 weeks telephone visit   Alphonsus Sias, PharmD Clinical Pharmacist [facility] at [facility] [phone]

## 2020-10-04 DIAGNOSIS — M5442 Lumbago with sciatica, left side: Secondary | ICD-10-CM | POA: Diagnosis not present

## 2020-10-04 DIAGNOSIS — M48061 Spinal stenosis, lumbar region without neurogenic claudication: Secondary | ICD-10-CM | POA: Diagnosis not present

## 2020-10-04 DIAGNOSIS — G8929 Other chronic pain: Secondary | ICD-10-CM | POA: Diagnosis not present

## 2020-10-07 ENCOUNTER — Telehealth: Payer: Self-pay

## 2020-10-07 NOTE — Patient Instructions (Addendum)
Dear [name],  Below is a summary of the goals we discussed during our follow up appointment on October 07, 2020. Please contact me anytime with questions or concerns.   Visit Information  Goals Addressed            This Visit's Progress   . Pharmacy Care Plan        CARE PLAN ENTRY (see longitudinal plan of care for additional care plan information)  Current Barriers:  . Chronic Disease Management support, education, and care coordination needs related to Hypertension and Diabetes   Hypertension BP Readings from Last 3 Encounters:  08/23/20 (!) 148/67  07/19/20 122/72  06/16/20 128/80   . Pharmacist Clinical Goal(s): o Over the next 30 days, patient will work with PharmD and providers to achieve BP goal <140/90 . Current regimen:  o Lisinopril 10 mg - 1 tablet daily  . Interventions: o Reviewed home BP monitoring - patient lost home monitor . Patient self care activities - Over the next 30 days, patient will: o Continue lisinopril 10 mg daily.  o Check blood pressure once daily before breakfast for next 4 weeks. Always rest for 5 minutes prior to checking blood pressure. Repeat check after 2-3 minutes if blood pressure is above 140/90.   Diabetes Lab Results  Component Value Date/Time   HGBA1C 7.7 (A) 06/16/2020 11:13 AM   HGBA1C 6.9 (A) 01/15/2020 02:35 PM   HGBA1C 7.2 (H) 01/14/2019 02:26 PM   HGBA1C 7.0 (H) 11/01/2017 04:48 PM   . Pharmacist Clinical Goal(s): o Over the next 30 days, patient will work with PharmD and providers to achieve A1c goal <7% . Current regimen:  o Metformin 500 mg - 2 tablets with breakfast and 2 tablets with evening meal  Interventions: o Reviewed home BG log o Recommend starting glimepiride 2 mg daily - consult PCP . Patient self care activities - Over the next 30 days, patient will: . Continue current medications until further contact from clinic. Marland Kitchen Check blood glucose twice daily (before breakfast and 2 hours after first  bite of largest meal of the day).  o Contact provider with any episodes of hypoglycemia  Please see past updates related to this goal by clicking on the "Past Updates" button in the selected goal        The patient verbalized understanding of instructions, educational materials, and care plan provided today and declined offer to receive copy of patient instructions, educational materials, and care plan.   Telephone follow up appointment with pharmacy team member scheduled for: November 05, 2020 at 1 PM (PHONE VISIT)  Phil Dopp, PharmD Clinical Pharmacist [facility] at [facility] [phone]   Basics of Medicine Management Taking your medicines correctly is an important part of managing or preventing medical problems. Make sure you know what disease or condition your medicine is treating, and how and when to take it. If you do not take your medicine correctly, it may not work well and may cause unpleasant side effects, including serious health problems. What should I do when I am taking medicines?  Read all the labels and inserts that come with your medicines. Review the information often.  Talk with your pharmacist if you get a refill and notice a change in the size, color, or shape of your medicines.  Know the potential side effects for each medicine that you take.  Try to get all your medicines from the same pharmacy. The pharmacist will have all your information and will  understand how your medicines will affect each other (interact).  Tell your health care provider about all your medicines, including over-the-counter medicines, vitamins, and herbal or dietary supplements. He or she will make sure that nothing will interact with any of your prescribed medicines.   How can I take my medicines safely?  Take medicines only as told by your health care provider. ? Do not take more of your medicine than instructed. ? Do not take anyone else's medicines. ? Do not  share your medicines with others. ? Do not stop taking your medicines unless your health care provider tells you to do so. ? You may need to avoid alcohol or certain foods or liquids when taking certain medicines. Follow your health care provider's instructions.  Do not split, mash, or chew your medicines unless your health care provider tells you to do so. Tell your health care provider if you have trouble swallowing your medicines.  For liquid medicine, use the dosing container that was provided. How should I organize my medicines? Know your medicines  Know what each of your medicines looks like. This includes size, color, and shape. Tell your health care provider if you are having trouble recognizing all the medicines that you are taking.  If you cannot tell your medicines apart because they look similar, keep them in original bottles.  If you cannot read the labels on the bottles, tell your pharmacist to put your medicines in containers with large print.  Review your medicines and your schedule with family members, a friend, or a caregiver. Use a pill organizer  Use a tool to organize your medicine schedule. Tools include a weekly pillbox, a written chart, a notebook, or a calendar.  Your tool should help you remember the following things about each medicine: ? The name of the medicine. ? The amount (dose) to take. ? The schedule. This is the day and time the medicine should be taken. ? The appearance. This includes color, shape, size, and stamp. ? How to take your medicines. This includes instructions to take them with food, without food, with fluids, or with other medicines.  Create reminders for taking your medicines. Use sticky notes, or alarms on your watch, mobile device, or phone calendar.  You may choose to use a more advanced management system. These systems have storage, alarms, and visual and audio prompts.  Some medicines can be taken on an "as-needed" basis. These  include medicines for nausea or pain. If you take an as-needed medicine, write down the name and dose, as well as the date and time that you took it.   How should I plan for travel?  Take your pillbox, medicines, and organization system with you when traveling.  Have your medicines refilled before you travel. This will ensure that you do not run out of your medicines while you are away from home.  Always carry an updated list of your medicines with you. If there is an emergency, a first responder can quickly see what medicines you are taking.  Do not pack your medicines in checked luggage in case your luggage is lost or delayed.  If any of your medicines is considered a controlled substance, make sure you bring a letter from your health care provider with you. How should I store and discard my medicines? For safe storage:  Store medicines in a cool, dry area away from light, or as directed by your health care provider. Do not store medicines in the bathroom. Heat and humidity  will affect them.  Do not store your medicines with other chemicals, or with medicines for pets or other household members.  Keep medicines away from children and pets. Do not leave them on counters or bedside tables. Store them in high cabinets or on high shelves. For safe disposal:  Check expiration dates regularly. Do not take expired medicines. Discard medicines that are older than the expiration date.  Learn a safe way to dispose of your medicines. You may: ? Use a local government, hospital, or pharmacy medicine-take-back program. ? Mix the medicines with inedible substances, put them in a sealed bag or empty container, and throw them in the trash. What should I remember?  Tell your health care provider if you: ? Experience side effects. ? Have new symptoms. ? Have other concerns about taking your medicines.  Review your medicines regularly with your health care provider. Other medicines, diet, medical  conditions, weight changes, and daily habits can all affect how medicines work. Ask if you need to continue taking each medicine, and discuss how well each one is working.  Refill your medicines early to avoid running out of them.  In case of an accidental overdose, call your local Poison Control Center at [phone] or visit your local emergency department immediately. This is important. Summary  Taking your medicines correctly is an important part of managing or preventing medical problems.  You need to make sure that you understand what you are taking a medicine for, as well as how and when you need to take it.  Know your medicines and use a pill organizer to help you take your medicines correctly.  In case of an accidental overdose, call your local Poison Control Center at [phone] or visit your local emergency department immediately. This is important. This information is not intended to replace advice given to you by your health care provider. Make sure you discuss any questions you have with your health care provider. Document Revised: 08/23/2017 Document Reviewed: 08/23/2017 Elsevier Patient Education  2021 [employer].

## 2020-10-07 NOTE — Telephone Encounter (Signed)
PCP consult regarding CCM visit 09/28/20:  Patient started taking her husbands glimepiride 2 mg once daily for the past 2-3 weeks due to concern about elevated BG readings. She has received multiple steroid injections. She has noticed improvement in her BG with glimepiride. She would like to continue this as her numbers have improved and its inexpensive. Declines alternative such as SGLT-2 or GLP-1 due to cost concerns. She checks her BG at least once daily.  Fasting last 3 days: 142, 130, 202  --> reports closer to 150 on average, denies hypoglycemia (lowest 82) 2 hours after breakfast: 174  Consult PCP to start glimepiride 2 mg once daily.  Lab Results  Component Value Date/Time   HGBA1C 7.7 (A) 06/16/2020 11:13 AM   HGBA1C 6.9 (A) 01/15/2020 02:35 PM   HGBA1C 7.2 (H) 01/14/2019 02:26 PM   HGBA1C 7.0 (H) 11/01/2017 04:48 PM   Phil Dopp, PharmD Clinical Pharmacist [facility] at [facility] [phone]

## 2020-10-08 MED ORDER — GLIMEPIRIDE 2 MG PO TABS
2.0000 mg | ORAL_TABLET | Freq: Every day | ORAL | 3 refills | Status: DC
Start: 2020-10-08 — End: 2020-11-05

## 2020-10-08 NOTE — Telephone Encounter (Signed)
Diana Roberson, can you call and let patient know Dr. Alphonsus Sias sent in glimepiride 2 mg daily at breakfast to the mail order pharmacy. She should stop taking if any BG < 70. Call if any symptoms of low blood sugar. Please follow up with her in 2 weeks for BG log.

## 2020-10-08 NOTE — Telephone Encounter (Signed)
Okay Let her know I sent it to [facility] for her. Make sure she knows to watch for hypogycemia with this (especially after the steroids wear off)

## 2020-10-08 NOTE — Chronic Care Management (AMB) (Signed)
    Chronic Care Management Pharmacy Assistant   Name: Diana Roberson  MRN: [id number] DOB: 1945/12/18  Reason for Encounter: Inform new medication has been sent in.   Contacted patient to inform her Dr. Alphonsus Sias sent in glimepiride 2 mg daily at breakfast to her mail order pharmacy. She should stop taking if any BG < 70. Call if any symptoms of low blood sugar. Set up follow up date for 10/25/20 to review new blood glucose readings.  Patient denied any concerns or questions for pharmacist, Phil Dopp.   Follow-Up:  Pharmacist Review   Phil Dopp, CPP notified  Jomarie Longs, [facility] Clinical Pharmacy Assistant [phone]

## 2020-10-11 DIAGNOSIS — Z79899 Other long term (current) drug therapy: Secondary | ICD-10-CM | POA: Diagnosis not present

## 2020-10-11 DIAGNOSIS — R270 Ataxia, unspecified: Secondary | ICD-10-CM | POA: Diagnosis not present

## 2020-10-11 DIAGNOSIS — M059 Rheumatoid arthritis with rheumatoid factor, unspecified: Secondary | ICD-10-CM | POA: Diagnosis not present

## 2020-10-25 ENCOUNTER — Telehealth: Payer: Self-pay

## 2020-10-25 NOTE — Chronic Care Management (AMB) (Addendum)
Chronic Care Management Pharmacy Assistant   Name: Diana Roberson  MRN: [id number] DOB: 1946-06-28  Reason for Encounter: Disease State- Blood glucose log update  PCP : Karie Schwalbe, MD  Allergies:   Allergies  Allergen Reactions   Codeine Sulfate Other (See Comments)    Indigestion and epigastric pain   Sertraline Hcl Other (See Comments)    REACTION: Worse, ? irritable    Medications: Outpatient Encounter Medications as of 10/25/2020  Medication Sig   aspirin EC 81 MG tablet Take 81 mg by mouth daily.   atorvastatin (LIPITOR) 20 MG tablet TAKE 1 TABLET BY MOUTH  DAILY   brimonidine (ALPHAGAN) 0.2 % ophthalmic solution Place 1 drop into both eyes 2 (two) times daily.   calcium gluconate 500 MG tablet Take 1 tablet by mouth 2 (two) times daily.    cholecalciferol (VITAMIN D) 1000 UNITS tablet Take 1,000 Units by mouth daily.   cyclobenzaprine (FLEXERIL) 10 MG tablet TAKE 1 TABLET BY MOUTH 3  TIMES DAILY AS NEEDED FOR  MUSCLE SPASM(S)   dorzolamide-timolol (COSOPT) 22.3-6.8 MG/ML ophthalmic solution Place 1 drop into both eyes 2 (two) times daily.   famotidine (PEPCID) 20 MG tablet Take 1 tablet (20 mg total) by mouth daily. In am   folic acid (FOLVITE) 1 MG tablet Take 1 tablet (1 mg total) by mouth daily.   glimepiride (AMARYL) 2 MG tablet Take 1 tablet (2 mg total) by mouth daily before breakfast.   glucose blood (ONETOUCH VERIO) test strip Use to check blood sugar once a day. Dx Code E11.9   lisinopril (ZESTRIL) 10 MG tablet Take 1 tablet (10 mg total) by mouth daily.   metFORMIN (GLUCOPHAGE) 500 MG tablet Take 2 tablets (1,000 mg total) by mouth 2 (two) times daily with a meal.   methotrexate (RHEUMATREX) 2.5 MG tablet Take 7 tablets (17.5 mg total) by mouth once a week. Caution:Chemotherapy. Protect from light.   mirabegron ER (MYRBETRIQ) 25 MG TB24 tablet Take 1 tablet (25 mg total) by mouth daily.   OneTouch Delica Lancets 33G MISC 1 each by Does not apply route  daily. Dx Code E11.9   QUEtiapine (SEROQUEL) 50 MG tablet TAKE 1 TABLET BY MOUTH AT  BEDTIME   vitamin B-12 (CYANOCOBALAMIN) 1000 MCG tablet Take 1,000 mcg by mouth daily.     vitamin E 400 UNIT capsule Take 400 Units by mouth daily.     ziprasidone (GEODON) 80 MG capsule TAKE 1 TO 2 CAPSULES BY  MOUTH DAILY   No facility-administered encounter medications on file as of 10/25/2020.    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   Seropositive rheumatoid arthritis (HCC) 12/28/2015   Glaucoma 04/30/2015   Advance directive discussed with patient 04/30/2015   Routine general medical examination at a health care facility 12/06/2012   CAROTID BRUIT 11/08/2010   Bipolar 1 disorder, manic, full remission (HCC) 10/01/2009   OSTEOPENIA 07/26/2007   Hyperlipemia 01/22/2007   DIVERTICULOSIS, COLON 01/08/2007   Chronic back pain 01/08/2007    Recent Relevant Labs: Lab Results  Component Value Date/Time   HGBA1C 7.7 (A) 06/16/2020 11:13 AM   HGBA1C 6.9 (A) 01/15/2020 02:35 PM   HGBA1C 7.2 (H) 01/14/2019 02:26 PM   HGBA1C 7.0 (H)  11/01/2017 04:48 PM   MICROALBUR 1.7 04/30/2015 12:43 PM   MICROALBUR 2.1 (H) 06/09/2013 12:54 PM    Kidney Function Lab Results  Component Value Date/Time   CREATININE 0.85 07/19/2020 04:38 PM   CREATININE 0.88 03/02/2020 11:09 AM   GFR 67.03 07/19/2020 04:38 PM   GFRNONAA 67 01/17/2016 03:45 PM   GFRAA 77 01/17/2016 03:45 PM     Current antihyperglycemic regimen:  Glimepiride 2 mg - 1 daily Metformin 500 mg - 2 tablets with breakfast and 2 tablets with evening meal   How often are you checking your blood sugar? before breakfast  What are your blood sugars ranging?   Fasting:  10/07/20-  89 10/08/20- 124 10/09/20- 125 10/10/20- 95 10/11/20- 78 10/12/20- 93 10/13/20-191 10/14/20- 177 10/15/20- 99 10/16/20- 48 10/17/20-151 10/18/20-85 10/19/20-73 10/20/20-78 10/21/20-121 10/22/20-105 10/23/20-90 10/24/20-95 10/25/20-87 10/26/20-99 10/27/20-83 Before meals: N/A After meals: 2 hours after meal 10/20/20-118 10/25/20-105 Bedtime: N/A  On insulin? No  Adherence Review: Is the patient currently on a STATIN medication? Yes Is the patient currently on ACE/ARB medication? Yes Does the patient have >5 day gap between last estimated fill dates? CPP to review  Asked patient if she felt bad on her readings of 177, 191 and 48. She states she did not, she did not realized they were that high or low.   Follow-Up:  Pharmacist Review  Phil Dopp, CPP notified  Jomarie Longs, [facility] Clinical Pharmacy Assistant [phone]  I have reviewed the care management and care coordination activities outlined in this encounter and I am certifying that I agree with the content of this note. Contacted patient to recommend she trial off glimepiride considering fasting numbers are running on the low end. Unable to reach. Will have CMA contact.  Phil Dopp, PharmD Clinical Pharmacist [facility] at [facility] [phone]

## 2020-11-01 ENCOUNTER — Other Ambulatory Visit: Payer: Self-pay | Admitting: Internal Medicine

## 2020-11-02 NOTE — Chronic Care Management (AMB) (Addendum)
Contacted patient per Phil Dopp to ask that she stop the Glimepiride due to low fasting blood sugars. Asked that she continue to monitor her blood glucose levels. Explained that if she has fasting readings above 130 to contact us and if she has any type of steroid injection she may take the glimepiride for 3-5 days following the injection. Patient verbalized understanding.    Follow-Up:  Pharmacist Review  Phil Dopp, CPP notified  Jomarie Longs, [facility] Clinical Pharmacy Assistant [phone]  I have reviewed the care management and care coordination activities outlined in this encounter and I am certifying that I agree with the content of this note. No further action required.  Phil Dopp, PharmD Clinical Pharmacist [facility] at [facility] [phone]

## 2020-11-02 NOTE — Telephone Encounter (Signed)
Please let patient know I would like her to stop the glimepiride and continue to monitor BG for a few weeks. Call if fasting BG is consistently above 130. Her fasting readings are running very low. She can keep the glimepiride and take it for 3-5 days after any steroid injections.  Phil Dopp, PharmD Clinical Pharmacist [facility] at [facility] [phone]

## 2020-11-04 DIAGNOSIS — M069 Rheumatoid arthritis, unspecified: Secondary | ICD-10-CM | POA: Diagnosis not present

## 2020-11-04 DIAGNOSIS — Z79899 Other long term (current) drug therapy: Secondary | ICD-10-CM | POA: Diagnosis not present

## 2020-11-04 LAB — HM DIABETES EYE EXAM

## 2020-11-04 NOTE — Telephone Encounter (Signed)
Noted  

## 2020-11-05 ENCOUNTER — Telehealth: Payer: Self-pay

## 2020-11-05 ENCOUNTER — Ambulatory Visit (INDEPENDENT_AMBULATORY_CARE_PROVIDER_SITE_OTHER): Payer: Medicare Other

## 2020-11-05 ENCOUNTER — Other Ambulatory Visit: Payer: Self-pay

## 2020-11-05 DIAGNOSIS — E1159 Type 2 diabetes mellitus with other circulatory complications: Secondary | ICD-10-CM

## 2020-11-05 DIAGNOSIS — I10 Essential (primary) hypertension: Secondary | ICD-10-CM | POA: Diagnosis not present

## 2020-11-05 NOTE — Telephone Encounter (Signed)
Patient has had multiple falls lately and fell while I was speaking to her on the phone today. She reports imbalance. Using a walker in the home. No hypoglycemia or hypotension. Could we contact her to schedule a PCP visit at soonest convenience?  Phil Dopp, PharmD Clinical Pharmacist [facility] at [facility] [phone]

## 2020-11-05 NOTE — Telephone Encounter (Signed)
I spoke to patient and  scheduled appointment with Dr.Letvak on 11/08/20.

## 2020-11-05 NOTE — Patient Instructions (Signed)
Dear Diana Roberson,  Below is a summary of the goals we discussed during our follow up appointment on November 05, 2020. Please contact me anytime with questions or concerns.   Visit Information  Patient Care Plan: CCM Pharmacy Care Plan    Problem Identified: CHL AMB "PATIENT-SPECIFIC PROBLEM"     Long-Range Goal: Disease Management   Start Date: 11/05/2020  Priority: High  Note:    Current Barriers:  . Unable to achieve control of diabetes  . Frequent falls  Pharmacist Clinical Goal(s):  Marland Kitchen Over the next 30 days, patient will achieve control of diabetes as evidenced by fasting BG < 130 through collaboration with PharmD and provider.   Interventions: . 1:1 collaboration with Karie Schwalbe, MD regarding development and update of comprehensive plan of care as evidenced by provider attestation and co-signature . Inter-disciplinary care team collaboration (see longitudinal plan of care) . Comprehensive medication review performed; medication list updated in electronic medical record  Diabetes (A1c goal <7%) -Controlled -Current medications: Marland Kitchen Metformin 500 mg - 2 tablets BID with meals . Glimepiride 2 mg - 1 tablet daily (stopped 11/02/20) . Recent changes: Stopped glimepiride due to fasting BG in 90s. She may keep it for PRN 3-5 days after steroid injections. -Medications previously tried: none -Current home glucose readings . fasting glucose: 125 (today); BG was ranging around 85-100 on glimepiride so this was discontinued on 2/22 . post prandial glucose: none -Denies hypoglycemic/hyperglycemic symptoms -Recommended to continue current medication; Patient will call if fasting BG consistently above 130 off glimepiride.   Hypertension (BP goal <140/90) -Controlled -Current treatment: . Lisinopril 10 mg - 1 tablet daily -Medications previously tried: none -Current home readings: 142/93, 172/81, 128/93, 140/80, 143/96, 140/70, 148/83, 134/92, 136/73, 140/101, 122/76,  141/100 Checking BP twice weekly in the afternoons. -Denies hypotensive/hypertensive symptoms -Counseled to monitor BP at home twice weekly, document, and provide log at future appointments -Discussed there is some room for improvement in her BP but due to current fall risk, would like to defer any BP med changes.  -Recommended to continue current medication; Continue to monitor BP twice weekly.   Health Maintenance -Patient is still having frequent falls. She fell during phone call today. Next follow up with PCP April 2022. -Gaps - Last DEXA 2021 considered inaccurate. Prior 2017 normal.  -Current therapy:  Marland Kitchen Vitamin D3 1000 IU - 1 capsule daily -Recommended  repeating DEXA scan; Prior visit, recommended reducing dose of quetiapine due to fall risk.   Patient Goals/Self-Care Activities . Over the next 30 days, patient will:  - check glucose daily, document, and provide at future appointments check blood pressure twice weekly, document, and provide at future appointments  Follow Up Plan: The care management team will reach out to the patient again over the next 30 days.       The patient verbalized understanding of instructions, educational materials, and care plan provided today and agreed to receive a mailed copy of patient instructions, educational materials, and care plan.    Assistant will call for BG and BP log in 4 weeks.  Phil Dopp, PharmD Clinical Pharmacist [facility] at [facility] [phone]

## 2020-11-05 NOTE — Progress Notes (Addendum)
Chronic Care Management Pharmacy Note  11/05/2020 Name:  Diana Roberson MRN:  [id number] DOB:  12/05/1945  Subjective: Diana Roberson is an 75 y.o. year old female who is a primary patient of Letvak, Theophilus Kinds, MD.  The CCM team was consulted for assistance with disease management and care coordination needs.    Engaged with patient by telephone for follow up visit in response to provider referral for pharmacy case management and/or care coordination services.   Consent to Services:  The patient was given information about Chronic Care Management services, agreed to services, and gave verbal consent prior to initiation of services.  Please see initial visit note for detailed documentation.   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 Cannot rule out early Parkinson's Question mild dementia  Rheumatoid arthritis, mainly fixed disease but mild hand and elbow synovitis. We will get labs today and at Plaquenil 1 pill a day. See back 3 months. Might reconsider biologic depending on her other medical status. We will stay with PT for balance therapy. 10/04/20 - Physical Medicine and Rehab - Chronic, low back pain - Tolerating gabapentin, minimal improvement. Pain mostly at night, will move gabapentin to nighttime. Hold off with any further epidural injections. I did tell her that if her pain does flareup that she is to call me and we can get her in for another epidural injection.  08/24/20 - Physical Medicine and Rehab - Increase gabapentin to TID.   Hospital visits: 08/23/20 - Fall, ED visit   Objective:  Lab Results  Component Value Date   CREATININE 0.85 07/19/2020   BUN 16 07/19/2020   GFR 67.03 07/19/2020   GFRNONAA 67 01/17/2016   GFRAA  77 01/17/2016   NA 137 07/19/2020   K 4.5 07/19/2020   CALCIUM 10.0 07/19/2020   CO2 29 07/19/2020    Lab Results  Component Value Date/Time   HGBA1C 7.7 (A) 06/16/2020 11:13 AM   HGBA1C 6.9 (A) 01/15/2020 02:35 PM   HGBA1C 7.2 (H) 01/14/2019 02:26 PM   HGBA1C 7.0 (H) 11/01/2017 04:48 PM   GFR 67.03 07/19/2020 04:38 PM   GFR 62.64 03/02/2020 11:09 AM   MICROALBUR 1.7 04/30/2015 12:43 PM   MICROALBUR 2.1 (H) 06/09/2013 12:54 PM    Last diabetic Eye exam:  Lab Results  Component Value Date/Time   HMDIABEYEEXA No Retinopathy 05/07/2020 12:00 AM    Last diabetic Foot exam:  Lab Results  Component Value Date/Time   HMDIABFOOTEX done 01/15/2020 12:00 AM     Lab Results  Component Value Date   CHOL 112 07/19/2020   HDL 53.00 07/19/2020   LDLCALC 42 07/19/2020   LDLDIRECT 127.5 04/05/2010   TRIG 85.0 07/19/2020   CHOLHDL 2 07/19/2020    Hepatic Function Latest Ref Rng & Units 07/19/2020 03/02/2020 01/14/2019  Total Protein 6.0 - 8.3 g/dL 7.3 - 6.8  Albumin 3.5 - 5.2 g/dL 4.2 3.9 3.9  AST 0 - 37 U/L 29 - 16  ALT 0 - 35 U/L 32 - 14  Alk Phosphatase 39 - 117 U/L 112 - 85  Total Bilirubin 0.2 - 1.2 mg/dL 0.6 - 0.4  Bilirubin, Direct 0.0 - 0.3 mg/dL - - -    Lab Results  Component Value Date/Time   TSH 1.18 02/09/2014 12:54 PM  TSH 0.79 06/09/2013 12:54 PM   FREET4 0.93 07/19/2020 04:38 PM   FREET4 0.80 04/30/2015 12:43 PM    CBC Latest Ref Rng & Units 07/19/2020 01/14/2019 04/30/2015  WBC 4.0 - 10.5 K/uL 7.6 6.3 7.9  Hemoglobin 12.0 - 15.0 g/dL 12.6 11.1(L) 11.4(L)  Hematocrit 36.0 - 46.0 % 37.8 32.1(L) 33.8(L)  Platelets 150.0 - 400.0 K/uL 283.0 275.0 371.0    No results found for: VD25OH  Clinical ASCVD: No  The ASCVD Risk score Mikey Bussing DC Jr., et al., 2013) failed to calculate for the following reasons:   The valid total cholesterol range is 130 to 320 mg/dL    Depression screen [facility] 2/9 07/19/2020 07/17/2019 11/01/2017  Decreased Interest 0 0 0  Down, Depressed,  Hopeless 0 0 0  PHQ - 2 Score 0 0 0     DEXA - home foot test done 11/25/19 T score -3.4. PCP did not trust score as reported home foot test unreliable and prior DEXA 2017 normal. Plan to discuss whether to repeat formal test at next AWV.  Social History   Tobacco Use  Smoking Status Never Smoker  Smokeless Tobacco Never Used   BP Readings from Last 3 Encounters:  08/23/20 (!) 148/67  07/19/20 122/72  06/16/20 128/80   Pulse Readings from Last 3 Encounters:  08/23/20 94  07/19/20 74  06/16/20 89   Wt Readings from Last 3 Encounters:  08/22/20 130 lb (59 kg)  07/19/20 130 lb 12.8 oz (59.3 kg)  06/16/20 131 lb (59.4 kg)    Assessment/Interventions: Review of patient past medical history, allergies, medications, health status, including review of consultants reports, laboratory and other test data, was performed as part of comprehensive evaluation and provision of chronic care management services.   SDOH:  (Social Determinants of Health) assessments and interventions performed: Yes SDOH Interventions   Flowsheet Row Most Recent Value  SDOH Interventions   Financial Strain Interventions Intervention Not Indicated  [Medications affordable]      CCM Care Plan  Allergies  Allergen Reactions  . Codeine Sulfate Other (See Comments)    Indigestion and epigastric pain  . Sertraline Hcl Other (See Comments)    REACTION: Worse, ? irritable    Medications Reviewed Today    Reviewed by Debbora Dus, [facility] (Pharmacist) on 11/05/20 at 1414  Med List Status: <None>  Medication Order Taking? Sig Documenting Provider Last Dose Status Informant  aspirin EC 81 MG tablet [id number]  Take 81 mg by mouth daily. [provider]  Active   atorvastatin (LIPITOR) 20 MG tablet [id number]  TAKE 1 TABLET BY MOUTH  DAILY Viviana Simpler I, MD  Active   brimonidine (ALPHAGAN) 0.2 % ophthalmic solution [id number]  Place 1 drop into both eyes 2 (two) times daily. [provider]   Active Spouse/Significant Other  calcium gluconate 500 MG tablet [id number]  Take 1 tablet by mouth 2 (two) times daily.  [provider]  Active Spouse/Significant Other  cholecalciferol (VITAMIN D) 1000 UNITS tablet [id number]  Take 1,000 Units by mouth daily. [provider]  Active Spouse/Significant Other  cyclobenzaprine (FLEXERIL) 10 MG tablet [id number]  TAKE 1 TABLET BY MOUTH 3  TIMES DAILY AS NEEDED FOR  MUSCLE SPASM(S) Venia Carbon, MD  Active   dorzolamide-timolol (COSOPT) 22.3-6.8 MG/ML ophthalmic solution [id number]  Place 1 drop into both eyes 2 (two) times daily. [provider]  Active Spouse/Significant Other  famotidine (PEPCID) 20 MG tablet [id number]  Take 1 tablet (20 mg total) by mouth  daily. In am Venia Carbon, MD  Active   folic acid (FOLVITE) 1 MG tablet [id number]  Take 1 tablet (1 mg total) by mouth daily. Venia Carbon, MD  Active Spouse/Significant Other  glimepiride (AMARYL) 2 MG tablet [id number]  Take 1 tablet (2 mg total) by mouth daily before breakfast. Venia Carbon, MD  Active   glucose blood [facility] VERIO) test strip [id number]  Use to check blood sugar once a day. Dx Code E11.9 Venia Carbon, MD  Active Spouse/Significant Other  lisinopril (ZESTRIL) 10 MG tablet [id number]  Take 1 tablet (10 mg total) by mouth daily. Venia Carbon, MD  Active   metFORMIN (GLUCOPHAGE) 500 MG tablet [id number]  Take 2 tablets (1,000 mg total) by mouth 2 (two) times daily with a meal. Venia Carbon, MD  Active   methotrexate (RHEUMATREX) 2.5 MG tablet [id number]  Take 7 tablets (17.5 mg total) by mouth once a week. Caution:Chemotherapy. Protect from light. Venia Carbon, MD  Active   mirabegron ER [facility]) 25 MG TB24 tablet [id number]  Take 1 tablet (25 mg total) by mouth daily. Venia Carbon, MD  Active   OneTouch Delica Lancets 53Z MISC [id number]  1 each by Does not apply route daily. Dx Code E11.9 Venia Carbon, MD   Active Spouse/Significant Other  QUEtiapine (SEROQUEL) 50 MG tablet [id number]  TAKE 1 TABLET BY MOUTH AT  BEDTIME Venia Carbon, MD  Active   vitamin B-12 (CYANOCOBALAMIN) 1000 MCG tablet [id number]  Take 1,000 mcg by mouth daily.   [provider]  Active Spouse/Significant Other  vitamin E 400 UNIT capsule [id number]  Take 400 Units by mouth daily.   [provider]  Active Spouse/Significant Other  ziprasidone (GEODON) 80 MG capsule [id number]  TAKE 1 TO 2 CAPSULES BY  MOUTH DAILY Venia Carbon, MD  Active           Patient Active Problem List   Diagnosis Date Noted  . OAB (overactive bladder) 07/19/2020  . Essential hypertension, benign 01/15/2020  . Plantar wart of left foot 01/13/2019  . Gastroesophageal reflux disease 11/01/2017  . Type 2 diabetes mellitus with circulatory disorder ([place]) 12/28/2015  . Seropositive rheumatoid arthritis ([place]) 12/28/2015  . Glaucoma 04/30/2015  . Advance directive discussed with patient 04/30/2015  . Routine general medical examination at a health care facility 12/06/2012  . CAROTID BRUIT 11/08/2010  . Bipolar 1 disorder, manic, full remission ([place]) 10/01/2009  . OSTEOPENIA 07/26/2007  . Hyperlipemia 01/22/2007  . DIVERTICULOSIS, COLON 01/08/2007  . Chronic back pain 01/08/2007    Immunization History  Administered Date(s) Administered  . Influenza Whole 07/26/2007  . Pneumococcal Conjugate-13 02/09/2014  . Pneumococcal Polysaccharide-23 04/05/2010, 11/01/2017  . Td 10/21/1996, 04/05/2010  . Zoster 05/10/2013    Conditions to be addressed/monitored:  Hypertension and Diabetes  Care Plan : [place]  Updates made by Debbora Dus, [facility] since 11/05/2020 12:00 AM    Problem: CHL AMB "PATIENT-SPECIFIC PROBLEM"     Long-Range Goal: Disease Management   Start Date: 11/05/2020  Priority: High  Note:    Current Barriers:  . Unable to achieve control of diabetes  . Frequent falls  Pharmacist Clinical  Goal(s):  Marland Kitchen Over the next 30 days, patient will achieve control of diabetes as evidenced by fasting BG < 130 through collaboration with PharmD and provider.   Interventions: . 1:1 collaboration with Venia Carbon, MD regarding development and update of comprehensive plan of care  as evidenced by provider attestation and co-signature . Inter-disciplinary care team collaboration (see longitudinal plan of care) . Comprehensive medication review performed; medication list updated in electronic medical record  Diabetes (A1c goal <7%) -Controlled -Current medications: Marland Kitchen Metformin 500 mg - 2 tablets BID with meals . Glimepiride 2 mg - 1 tablet daily (stopped 11/02/20) . Recent changes: Stopped glimepiride due to fasting BG in 90s. She may keep it for PRN 3-5 days after steroid injections. -Medications previously tried: none -Current home glucose readings . fasting glucose: 125 (today); BG was ranging around 85-100 on glimepiride so this was discontinued on 2/22 . post prandial glucose: none -Denies hypoglycemic/hyperglycemic symptoms -Recommended to continue current medication; Patient will call if fasting BG consistently above 130 off glimepiride.   Hypertension (BP goal <140/90) -Controlled -Current treatment: . Lisinopril 10 mg - 1 tablet daily -Medications previously tried: none -Current home readings: 142/93, 172/81, 128/93, 140/80, 143/96, 140/70, 148/83, 134/92, 136/73, 140/101, 122/76, 141/100 Checking BP twice weekly in the afternoons. -Denies hypotensive/hypertensive symptoms -Counseled to monitor BP at home twice weekly, document, and provide log at future appointments -Discussed there is some room for improvement in her BP but due to current fall risk, would like to defer any BP med changes.  -Recommended to continue current medication; Continue to monitor BP twice weekly.   Health Maintenance -Patient is still having frequent falls. She fell during phone call today. Next  follow up with PCP April 2022. -Gaps - Last DEXA 2021 considered inaccurate. Prior 2017 normal.  -Current therapy:  Marland Kitchen Vitamin D3 1000 IU - 1 capsule daily -Recommended  repeating DEXA scan; Prior visit, recommended reducing dose of quetiapine due to fall risk.   Patient Goals/Self-Care Activities . Over the next 30 days, patient will:  - check glucose daily, document, and provide at future appointments check blood pressure twice weekly, document, and provide at future appointments  Follow Up Plan: The care management team will reach out to the patient again over the next 30 days.      Medication Assistance: None required.  Patient affirms current coverage meets needs.  Patient's preferred pharmacy is:  [facility], [address], [address] [place], [place] [address] Phone: [phone] Fax: [phone]  [id number] [id number]: [phone]: [phone] Care Plan and Follow Up Patient Decision:  Patient agrees to Care Plan and Follow-up.  Plan: CMA review BG and BP log next month. Coordinate PCP visit due to frequent falls.   MDebbora Dus PharmD Clinical Pharmacist [phone]  Encounter details: CCM Time Spent      Value Time User   Time spent with patient (minutes)  70 11/05/2020  2:48 PM [facility]a Dus [facility]  Time spent performing Chart review  30 11/05/2020  2:48 PM [facility]us [facility]  Total time (minutes)  100 11/05/2020  2:48 PM ADebbora Dus RPH     Moderate to High Complex Decision Making      Value Time User   Moderate to High complex decision making  Yes 11/05/2020  2:48 PM [facility]us [facility]    CCM Services: Complex  I have personally reviewed this encounter including the documentation in this note and have collaborated with the care management provider regarding care management  and care coordination activities to include development and update of the  comprehensive care plan. I am certifying that I agree with the content of this note and encounter as supervising physician.

## 2020-11-06 ENCOUNTER — Other Ambulatory Visit: Payer: Self-pay | Admitting: Internal Medicine

## 2020-11-08 ENCOUNTER — Ambulatory Visit (INDEPENDENT_AMBULATORY_CARE_PROVIDER_SITE_OTHER): Payer: Medicare Other | Admitting: Internal Medicine

## 2020-11-08 ENCOUNTER — Other Ambulatory Visit: Payer: Self-pay | Admitting: Internal Medicine

## 2020-11-08 ENCOUNTER — Other Ambulatory Visit: Payer: Self-pay

## 2020-11-08 ENCOUNTER — Encounter: Payer: Self-pay | Admitting: Internal Medicine

## 2020-11-08 DIAGNOSIS — R269 Unspecified abnormalities of gait and mobility: Secondary | ICD-10-CM | POA: Insufficient documentation

## 2020-11-08 MED ORDER — LISINOPRIL 10 MG PO TABS: 10.0000 mg | ORAL_TABLET | Freq: Every day | ORAL | 3 refills | Status: AC

## 2020-11-08 NOTE — Progress Notes (Signed)
Subjective:    Patient ID: Diana Roberson, female    DOB: Sep 03, 1946, 75 y.o.   MRN: [id number]  HPI Here with husband due to recent falls This visit occurred during the SARS-CoV-2 public health emergency.  Safety protocols were in place, including screening questions prior to the visit, additional usage of staff PPE, and extensive cleaning of exam room while observing appropriate contact time as indicated for disinfecting solutions.   Has had multiple falls---2 in the last week Once it happened when she was talking to our pharmacist---just slid out of her seat Other falls are when she is standing or starting to walk  Did go to the balance clinic a while back--for exercises and balance Helped slightly--but she didn't keep up with exercises No LOC No chest pain or SOB before the spells No blackened vision or signs of syncope--legs just give out  Sugars okay---average 150 fasting 170 2 hours after meals Just on metformin  Current Outpatient Medications on File Prior to Visit  Medication Sig Dispense Refill  . aspirin EC 81 MG tablet Take 81 mg by mouth daily.    Marland Kitchen atorvastatin (LIPITOR) 20 MG tablet TAKE 1 TABLET BY MOUTH  DAILY 90 tablet 3  . brimonidine (ALPHAGAN) 0.2 % ophthalmic solution Place 1 drop into both eyes 2 (two) times daily.    . calcium gluconate 500 MG tablet Take 1 tablet by mouth 2 (two) times daily.    . cholecalciferol (VITAMIN D) 1000 UNITS tablet Take 1,000 Units by mouth daily.    . cyclobenzaprine (FLEXERIL) 10 MG tablet TAKE 1 TABLET BY MOUTH 3  TIMES DAILY AS NEEDED FOR  MUSCLE SPASM(S) 180 tablet 0  . dorzolamide-timolol (COSOPT) 22.3-6.8 MG/ML ophthalmic solution Place 1 drop into both eyes 2 (two) times daily.    . famotidine (PEPCID) 20 MG tablet Take 1 tablet (20 mg total) by mouth daily. In am 90 tablet 3  . folic acid (FOLVITE) 1 MG tablet Take 1 tablet (1 mg total) by mouth daily. 90 tablet 3  . glucose blood (ONETOUCH VERIO) test strip Use to  check blood sugar once a day. Dx Code E11.9 100 each 3  . lisinopril (ZESTRIL) 10 MG tablet Take 1 tablet (10 mg total) by mouth daily. 90 tablet 3  . metFORMIN (GLUCOPHAGE) 500 MG tablet Take 2 tablets (1,000 mg total) by mouth 2 (two) times daily with a meal. 360 tablet 3  . methotrexate (RHEUMATREX) 2.5 MG tablet Take 7 tablets (17.5 mg total) by mouth once a week. Caution:Chemotherapy. Protect from light. 91 tablet 3  . mirabegron ER (MYRBETRIQ) 25 MG TB24 tablet Take 1 tablet (25 mg total) by mouth daily. 90 tablet 3  . OneTouch Delica Lancets 33G MISC 1 each by Does not apply route daily. Dx Code E11.9 100 each 3  . QUEtiapine (SEROQUEL) 50 MG tablet TAKE 1 TABLET BY MOUTH AT  BEDTIME 90 tablet 3  . vitamin B-12 (CYANOCOBALAMIN) 1000 MCG tablet Take 1,000 mcg by mouth daily.    . vitamin E 400 UNIT capsule Take 400 Units by mouth daily.    . ziprasidone (GEODON) 80 MG capsule TAKE 1 TO 2 CAPSULES BY  MOUTH DAILY 180 capsule 3   No current facility-administered medications on file prior to visit.    Allergies  Allergen Reactions  . Codeine Sulfate Other (See Comments)    Indigestion and epigastric pain  . Sertraline Hcl Other (See Comments)    REACTION: Worse, ? irritable  Past Medical History:  Diagnosis Date  . Allergy   . Bipolar 1 disorder (HCC)   . Chronic back pain   . Diabetes mellitus   . Difficult intubation   . Diverticulitis   . GERD (gastroesophageal reflux disease)   . Glaucoma   . Hyperlipidemia   . Hypertension   . Mental health disorder    admitted x3  . Osteopenia   . Rheumatoid arthritis [facility])     Past Surgical History:  Procedure Laterality Date  . APPENDECTOMY    . BACK SURGERY  1988  . BREAST CYST ASPIRATION     neg  . CHOLECYSTECTOMY    . COLONOSCOPY    . COLONOSCOPY WITH PROPOFOL N/A 11/27/2017   Procedure: COLONOSCOPY WITH PROPOFOL;  Surgeon: Christena Deem, MD;  Location: [facility] ENDOSCOPY;  Service: Endoscopy;  Laterality: N/A;  .  HAND SURGERY    . ORIF ELBOW FRACTURE Right 06/24/2019   Procedure: OPEN REDUCTION INTERNAL FIXATION (ORIF) ELBOW/OLECRANON FRACTURE;  Surgeon: Christena Flake, MD;  Location: [facility] ORS;  Service: Orthopedics;  Laterality: Right;    Family History  Problem Relation Age of Onset  . Diabetes Mother   . Hypertension Mother   . Stroke Sister   . Hashimoto's thyroiditis Daughter   . Stroke Father   . Stroke Sister   . Cancer Neg Hx   . Breast cancer Neg Hx     Social History   Socioeconomic History  . Marital status: Married    Spouse name: Not on file  . Number of children: 2  . Years of education: Not on file  . Highest education level: Not on file  Occupational History  . Occupation: formerly Designer, jewellery for [employer]  . Smoking status: Never Smoker  . Smokeless tobacco: Never Used  Vaping Use  . Vaping Use: Never used  Substance and Sexual Activity  . Alcohol use: No  . Drug use: No  . Sexual activity: Not on file  Other Topics Concern  . Not on file  Social History Narrative   Has living will   Husband is health care POA. Alternate is daughter [name]   Would accept resuscitation attempts   Would not want feeding tube if cognitively unaware   Social Determinants of Health   Financial Resource Strain: Low Risk   . Difficulty of Paying Living Expenses: Not very hard  Food Insecurity: Not on file  Transportation Needs: Not on file  Physical Activity: Not on file  Stress: Not on file  Social Connections: Not on file  Intimate Partner Violence: Not on file   Review of Systems No neuropathy in feet Has "bad" hammertoes    Objective:   Physical Exam Constitutional:      Appearance: Normal appearance.  Cardiovascular:     Rate and Rhythm: Normal rate and regular rhythm.     Heart sounds: No murmur heard. No gallop.   Pulmonary:     Effort: Pulmonary effort is normal.     Breath sounds: Normal breath sounds. No wheezing or rales.   Musculoskeletal:     Cervical back: Neck supple.  Lymphadenopathy:     Cervical: No cervical adenopathy.  Neurological:     Mental Status: She is alert.     Comments: Gait is slow and guarded but not ataxic Some balance issues but Romberg absent Mild leg weakness--symmetric            Assessment & Plan:

## 2020-11-08 NOTE — Patient Instructions (Signed)
Please stop the flexeril

## 2020-11-08 NOTE — Assessment & Plan Note (Signed)
With frequent falls Multifactorial Discussed using walker for now (they just got her one) Will refer for PT  No evidence of cardiorespiratory cause of these problems

## 2020-11-13 ENCOUNTER — Other Ambulatory Visit: Payer: Self-pay

## 2020-11-13 ENCOUNTER — Emergency Department: Payer: Medicare Other

## 2020-11-13 ENCOUNTER — Observation Stay
Admission: EM | Admit: 2020-11-13 | Discharge: 2020-11-14 | Disposition: A | Discharge status: Home / Self Care | Payer: Medicare Other | Attending: Hospitalist | Admitting: Hospitalist

## 2020-11-13 ENCOUNTER — Encounter: Payer: Self-pay | Admitting: Internal Medicine

## 2020-11-13 DIAGNOSIS — A419 Sepsis, unspecified organism: Secondary | ICD-10-CM | POA: Diagnosis not present

## 2020-11-13 DIAGNOSIS — I499 Cardiac arrhythmia, unspecified: Secondary | ICD-10-CM | POA: Diagnosis not present

## 2020-11-13 DIAGNOSIS — E119 Type 2 diabetes mellitus without complications: Secondary | ICD-10-CM | POA: Diagnosis not present

## 2020-11-13 DIAGNOSIS — E1159 Type 2 diabetes mellitus with other circulatory complications: Secondary | ICD-10-CM | POA: Diagnosis not present

## 2020-11-13 DIAGNOSIS — F3174 Bipolar disorder, in full remission, most recent episode manic: Secondary | ICD-10-CM

## 2020-11-13 DIAGNOSIS — K219 Gastro-esophageal reflux disease without esophagitis: Secondary | ICD-10-CM | POA: Diagnosis present

## 2020-11-13 DIAGNOSIS — I10 Essential (primary) hypertension: Secondary | ICD-10-CM | POA: Diagnosis not present

## 2020-11-13 DIAGNOSIS — U071 COVID-19: Secondary | ICD-10-CM | POA: Diagnosis present

## 2020-11-13 DIAGNOSIS — N3281 Overactive bladder: Secondary | ICD-10-CM | POA: Diagnosis not present

## 2020-11-13 DIAGNOSIS — Z79899 Other long term (current) drug therapy: Secondary | ICD-10-CM | POA: Insufficient documentation

## 2020-11-13 DIAGNOSIS — R0602 Shortness of breath: Secondary | ICD-10-CM | POA: Insufficient documentation

## 2020-11-13 DIAGNOSIS — R2681 Unsteadiness on feet: Secondary | ICD-10-CM | POA: Insufficient documentation

## 2020-11-13 DIAGNOSIS — Z743 Need for continuous supervision: Secondary | ICD-10-CM | POA: Diagnosis not present

## 2020-11-13 DIAGNOSIS — R4182 Altered mental status, unspecified: Principal | ICD-10-CM | POA: Diagnosis present

## 2020-11-13 DIAGNOSIS — R29898 Other symptoms and signs involving the musculoskeletal system: Secondary | ICD-10-CM | POA: Insufficient documentation

## 2020-11-13 DIAGNOSIS — R531 Weakness: Secondary | ICD-10-CM

## 2020-11-13 DIAGNOSIS — M6281 Muscle weakness (generalized): Secondary | ICD-10-CM | POA: Insufficient documentation

## 2020-11-13 DIAGNOSIS — R400 Somnolence: Secondary | ICD-10-CM | POA: Diagnosis not present

## 2020-11-13 DIAGNOSIS — Z7982 Long term (current) use of aspirin: Secondary | ICD-10-CM | POA: Diagnosis not present

## 2020-11-13 DIAGNOSIS — M059 Rheumatoid arthritis with rheumatoid factor, unspecified: Secondary | ICD-10-CM | POA: Diagnosis present

## 2020-11-13 DIAGNOSIS — D649 Anemia, unspecified: Secondary | ICD-10-CM | POA: Diagnosis present

## 2020-11-13 DIAGNOSIS — Z7984 Long term (current) use of oral hypoglycemic drugs: Secondary | ICD-10-CM | POA: Insufficient documentation

## 2020-11-13 DIAGNOSIS — R262 Difficulty in walking, not elsewhere classified: Secondary | ICD-10-CM | POA: Insufficient documentation

## 2020-11-13 DIAGNOSIS — R6889 Other general symptoms and signs: Secondary | ICD-10-CM | POA: Diagnosis not present

## 2020-11-13 DIAGNOSIS — J1282 Pneumonia due to coronavirus disease 2019: Secondary | ICD-10-CM | POA: Diagnosis not present

## 2020-11-13 LAB — CBC WITH DIFFERENTIAL/PLATELET
Abs Immature Granulocytes: 0.03 10*3/uL (ref 0.00–0.07)
Basophils Absolute: 0 10*3/uL (ref 0.0–0.1)
Basophils Relative: 0 %
Eosinophils Absolute: 0 10*3/uL (ref 0.0–0.5)
Eosinophils Relative: 1 %
HCT: 31.9 % — ABNORMAL LOW (ref 36.0–46.0)
Hemoglobin: 10.9 g/dL — ABNORMAL LOW (ref 12.0–15.0)
Immature Granulocytes: 1 %
Lymphocytes Relative: 13 %
Lymphs Abs: 0.6 10*3/uL — ABNORMAL LOW (ref 0.7–4.0)
MCH: 32.1 pg (ref 26.0–34.0)
MCHC: 34.2 g/dL (ref 30.0–36.0)
MCV: 93.8 fL (ref 80.0–100.0)
Monocytes Absolute: 0.5 10*3/uL (ref 0.1–1.0)
Monocytes Relative: 11 %
Neutro Abs: 3.5 10*3/uL (ref 1.7–7.7)
Neutrophils Relative %: 74 %
Platelets: 180 10*3/uL (ref 150–400)
RBC: 3.4 MIL/uL — ABNORMAL LOW (ref 3.87–5.11)
RDW: 13.7 % (ref 11.5–15.5)
WBC: 4.6 10*3/uL (ref 4.0–10.5)
nRBC: 0 % (ref 0.0–0.2)

## 2020-11-13 LAB — LACTIC ACID, PLASMA
Lactic Acid, Venous: 1.7 mmol/L (ref 0.5–1.9)
Lactic Acid, Venous: 2.1 mmol/L (ref 0.5–1.9)

## 2020-11-13 LAB — COMPREHENSIVE METABOLIC PANEL
ALT: 19 U/L (ref 0–44)
AST: 26 U/L (ref 15–41)
Albumin: 3.5 g/dL (ref 3.5–5.0)
Alkaline Phosphatase: 97 U/L (ref 38–126)
Anion gap: 12 (ref 5–15)
BUN: 23 mg/dL (ref 8–23)
CO2: 20 mmol/L — ABNORMAL LOW (ref 22–32)
Calcium: 8.9 mg/dL (ref 8.9–10.3)
Chloride: 105 mmol/L (ref 98–111)
Creatinine, Ser: 0.85 mg/dL (ref 0.44–1.00)
GFR, Estimated: >60 mL/min (ref >60)
Glucose, Bld: 211 mg/dL — ABNORMAL HIGH (ref 70–99)
Potassium: 3.8 mmol/L (ref 3.5–5.1)
Sodium: 137 mmol/L (ref 135–145)
Total Bilirubin: 0.7 mg/dL (ref 0.3–1.2)
Total Protein: 6.7 g/dL (ref 6.5–8.1)

## 2020-11-13 LAB — RETICULOCYTES
Immature Retic Fract: 7.2 % (ref 2.3–15.9)
RBC.: 3.46 MIL/uL — ABNORMAL LOW (ref 3.87–5.11)
Retic Count, Absolute: 47.7 10*3/uL (ref 19.0–186.0)
Retic Ct Pct: 1.4 % (ref 0.4–3.1)

## 2020-11-13 LAB — PROTIME-INR
INR: 1 (ref 0.8–1.2)
Prothrombin Time: 13.1 seconds (ref 11.4–15.2)

## 2020-11-13 LAB — URINALYSIS, COMPLETE (UACMP) WITH MICROSCOPIC
Bacteria, UA: NONE SEEN
Bilirubin Urine: NEGATIVE
Glucose, UA: 50 mg/dL — AB
Hgb urine dipstick: NEGATIVE
Ketones, ur: 5 mg/dL — AB
Leukocytes,Ua: NEGATIVE
Nitrite: NEGATIVE
Protein, ur: 30 mg/dL — AB
Specific Gravity, Urine: 1.024 (ref 1.005–1.030)
Squamous Epithelial / HPF: NONE SEEN (ref 0–5)
pH: 6 (ref 5.0–8.0)

## 2020-11-13 LAB — APTT: aPTT: 27 seconds (ref 24–36)

## 2020-11-13 LAB — RESP PANEL BY RT-PCR (FLU A&B, COVID) ARPGX2
Influenza A by PCR: NEGATIVE
Influenza B by PCR: NEGATIVE
SARS Coronavirus 2 by RT PCR: POSITIVE — AB

## 2020-11-13 MED ORDER — SODIUM CHLORIDE 0.9 % IV SOLN
500.0000 mg | INTRAVENOUS | Status: DC
  Filled 2020-11-13: qty 500

## 2020-11-13 MED ORDER — SODIUM CHLORIDE 0.9 % IV SOLN
200.0000 mg | Freq: Once | INTRAVENOUS | Status: AC
  Administered 2020-11-14: 200 mg via INTRAVENOUS
  Filled 2020-11-13: qty 40

## 2020-11-13 MED ORDER — INSULIN ASPART 100 UNIT/ML ~~LOC~~ SOLN: 0.0000 [IU] | Freq: Three times a day (TID) | SUBCUTANEOUS | Status: DC

## 2020-11-13 MED ORDER — SODIUM CHLORIDE 0.9 % IV SOLN
100.0000 mg | Freq: Every day | INTRAVENOUS | Status: DC
  Filled 2020-11-13 (×2): qty 20

## 2020-11-13 MED ORDER — MIRABEGRON ER 25 MG PO TB24
25.0000 mg | ORAL_TABLET | Freq: Every day | ORAL | Status: DC
  Administered 2020-11-14: 25 mg via ORAL
  Filled 2020-11-13: qty 1

## 2020-11-13 MED ORDER — HEPARIN SODIUM (PORCINE) 5000 UNIT/ML IJ SOLN: 5000.0000 [IU] | Freq: Three times a day (TID) | INTRAMUSCULAR | Status: DC

## 2020-11-13 MED ORDER — ONDANSETRON HCL 4 MG PO TABS: 4.0000 mg | ORAL_TABLET | Freq: Four times a day (QID) | ORAL | Status: DC | PRN

## 2020-11-13 MED ORDER — PREDNISONE 50 MG PO TABS: 50.0000 mg | ORAL_TABLET | Freq: Every day | ORAL | Status: DC

## 2020-11-13 MED ORDER — LISINOPRIL 10 MG PO TABS: 10.0000 mg | ORAL_TABLET | Freq: Every day | ORAL | Status: DC

## 2020-11-13 MED ORDER — QUETIAPINE FUMARATE 25 MG PO TABS
50.0000 mg | ORAL_TABLET | Freq: Every day | ORAL | Status: DC
  Administered 2020-11-14: 50 mg via ORAL
  Filled 2020-11-13: qty 2

## 2020-11-13 MED ORDER — SODIUM CHLORIDE 0.9 % IV SOLN
500.0000 mg | Freq: Once | INTRAVENOUS | Status: AC
  Administered 2020-11-13: 500 mg via INTRAVENOUS
  Filled 2020-11-13: qty 500

## 2020-11-13 MED ORDER — ZINC SULFATE 220 (50 ZN) MG PO CAPS
220.0000 mg | ORAL_CAPSULE | Freq: Every day | ORAL | Status: DC
  Administered 2020-11-14: 220 mg via ORAL
  Filled 2020-11-13: qty 1

## 2020-11-13 MED ORDER — DEXTROSE-NACL 5-0.45 % IV SOLN: INTRAVENOUS | Status: DC

## 2020-11-13 MED ORDER — SODIUM CHLORIDE 0.9 % IV SOLN
1.0000 g | INTRAVENOUS | Status: DC
  Administered 2020-11-13: 1 g via INTRAVENOUS
  Filled 2020-11-13 (×2): qty 10

## 2020-11-13 MED ORDER — ONDANSETRON HCL 4 MG/2ML IJ SOLN: 4.0000 mg | Freq: Four times a day (QID) | INTRAMUSCULAR | Status: DC | PRN

## 2020-11-13 MED ORDER — SODIUM CHLORIDE 0.9 % IV BOLUS (SEPSIS)
1000.0000 mL | Freq: Once | INTRAVENOUS | Status: AC
  Administered 2020-11-13: 1000 mL via INTRAVENOUS

## 2020-11-13 MED ORDER — ASPIRIN EC 81 MG PO TBEC
81.0000 mg | DELAYED_RELEASE_TABLET | Freq: Every day | ORAL | Status: DC
  Administered 2020-11-14: 81 mg via ORAL
  Filled 2020-11-13: qty 1

## 2020-11-13 MED ORDER — BRIMONIDINE TARTRATE 0.2 % OP SOLN
1.0000 [drp] | Freq: Two times a day (BID) | OPHTHALMIC | Status: DC
  Administered 2020-11-14: 1 [drp] via OPHTHALMIC
  Filled 2020-11-13: qty 5

## 2020-11-13 MED ORDER — ASCORBIC ACID 500 MG PO TABS
500.0000 mg | ORAL_TABLET | Freq: Every day | ORAL | Status: DC
  Administered 2020-11-14: 500 mg via ORAL
  Filled 2020-11-13: qty 1

## 2020-11-13 MED ORDER — SODIUM CHLORIDE 0.9 % IV SOLN: INTRAVENOUS | Status: DC

## 2020-11-13 MED ORDER — DORZOLAMIDE HCL-TIMOLOL MAL 2-0.5 % OP SOLN
1.0000 [drp] | Freq: Two times a day (BID) | OPHTHALMIC | Status: DC
  Administered 2020-11-14: 1 [drp] via OPHTHALMIC
  Filled 2020-11-13: qty 10

## 2020-11-13 MED ORDER — METHYLPREDNISOLONE SODIUM SUCC 125 MG IJ SOLR
1.0000 mg/kg | Freq: Two times a day (BID) | INTRAMUSCULAR | Status: DC
  Administered 2020-11-14: 60.625 mg via INTRAVENOUS
  Filled 2020-11-13: qty 2

## 2020-11-13 MED ORDER — ACETAMINOPHEN 325 MG PO TABS: 650.0000 mg | ORAL_TABLET | Freq: Four times a day (QID) | ORAL | Status: DC | PRN

## 2020-11-13 MED ORDER — ZIPRASIDONE HCL 80 MG PO CAPS
80.0000 mg | ORAL_CAPSULE | Freq: Every day | ORAL | Status: DC
  Administered 2020-11-14: 80 mg via ORAL
  Filled 2020-11-13: qty 1

## 2020-11-13 MED ORDER — ALBUTEROL SULFATE HFA 108 (90 BASE) MCG/ACT IN AERS
2.0000 | INHALATION_SPRAY | Freq: Four times a day (QID) | RESPIRATORY_TRACT | Status: DC
  Administered 2020-11-14: 2 via RESPIRATORY_TRACT
  Filled 2020-11-13: qty 6.7

## 2020-11-13 MED ORDER — ATORVASTATIN CALCIUM 20 MG PO TABS
20.0000 mg | ORAL_TABLET | Freq: Every day | ORAL | Status: DC
  Administered 2020-11-14: 20 mg via ORAL
  Filled 2020-11-13: qty 1

## 2020-11-13 MED ORDER — GUAIFENESIN-DM 100-10 MG/5ML PO SYRP: 10.0000 mL | ORAL_SOLUTION | ORAL | Status: DC | PRN

## 2020-11-13 NOTE — H&P (Addendum)
History and Physical    Diana Roberson EHO:[id number] DOB: 01-23-1946 DOA: 11/13/2020  PCP: Venia Carbon, MD    Patient coming from:  Home   Chief Complaint:  AMS   HPI: Diana Roberson is a 75 y.o. female brought to the emergency room for not acting right all day by spouse.  On arrival patient met Code sepsis / Sepsis protocol initiated.Also has been weak and decreased po intake. Pt on exam is lethargic and somnolent difficult to arouse  and minimally answers questions but falls back asleep. Patient has medical history significant of diabetes mellitus type 2, hypertension, rheumatoid arthritis, glaucoma, hyperlipidemia, overactive bladder, GERD, carotid bruit. ED Course:  Vitals:   11/13/20 2046 11/13/20 2051 11/13/20 2055 11/13/20 2300  BP:  117/60  (!) 111/58  Pulse:  (!) 117 (!) 116 (!) 104  Resp:  (!) 22  18  Temp:  98.5 F (36.9 C) (!) 101.3 F (38.5 C)   TempSrc:  Oral Rectal   SpO2:  97% 100% 99%  Weight: 60.8 kg     Height: 5' 4" (1.626 m)     In the ED patient meets sepsis criteria with fever tachycardia dyspnea, suspected source of infection, initial lactic of 1.7.  CBC shows normal white count of 4.6, hemoglobin of 10.9, hematocrit of 31.9, MCV 93.8, platelets of 180.  CMP is within normal limits except for glucose of 211.  Patient is found to be Covid positive in the emergency room. Pt received Remdesivir in ed and also covered with rocephin and azithromycin.Pt is not hypoxic, but does reports SOB and cough. Chest xray shows infiltrate.  Review of Systems:  Review of Systems  Unable to perform ROS: Other (Pt extremely somnolent. )   Past Medical History:  Diagnosis Date  . Allergy   . Bipolar 1 disorder ([place])   . Chronic back pain   . Diabetes mellitus   . Difficult intubation   . Diverticulitis   . GERD (gastroesophageal reflux disease)   . Glaucoma   . Hyperlipidemia   . Hypertension   . Mental health disorder    admitted x3  . Osteopenia   .  Rheumatoid arthritis [facility])     Past Surgical History:  Procedure Laterality Date  . APPENDECTOMY    . BACK SURGERY  1988  . BREAST CYST ASPIRATION     neg  . CHOLECYSTECTOMY    . COLONOSCOPY    . COLONOSCOPY WITH PROPOFOL N/A 11/27/2017   Procedure: COLONOSCOPY WITH PROPOFOL;  Surgeon: Lollie Sails, MD;  Location: [facility] ENDOSCOPY;  Service: Endoscopy;  Laterality: N/A;  . HAND SURGERY    . ORIF ELBOW FRACTURE Right 06/24/2019   Procedure: OPEN REDUCTION INTERNAL FIXATION (ORIF) ELBOW/OLECRANON FRACTURE;  Surgeon: Corky Mull, MD;  Location: [facility] ORS;  Service: Orthopedics;  Laterality: Right;     reports that she has never smoked. She has never used smokeless tobacco. She reports that she does not drink alcohol and does not use drugs.  Allergies  Allergen Reactions  . Codeine Sulfate Other (See Comments)    Indigestion and epigastric pain  . Sertraline Hcl Other (See Comments)    REACTION: Worse, ? irritable    Family History  Problem Relation Age of Onset  . Diabetes Mother   . Hypertension Mother   . Stroke Sister   . Hashimoto's thyroiditis Daughter   . Stroke Father   . Stroke Sister   . Cancer Neg Hx   . Breast cancer  Neg Hx     Prior to Admission medications   Medication Sig Start Date End Date Taking? Authorizing Provider  aspirin EC 81 MG tablet Take 81 mg by mouth daily.    [provider]  atorvastatin (LIPITOR) 20 MG tablet TAKE 1 TABLET BY MOUTH  DAILY 09/02/20   Viviana Simpler I, MD  brimonidine (ALPHAGAN) 0.2 % ophthalmic solution Place 1 drop into both eyes 2 (two) times daily.    [provider]  calcium gluconate 500 MG tablet Take 1 tablet by mouth 2 (two) times daily.    [provider]  cholecalciferol (VITAMIN D) 1000 UNITS tablet Take 1,000 Units by mouth daily.    [provider]  dorzolamide-timolol (COSOPT) 22.3-6.8 MG/ML ophthalmic solution Place 1 drop into both eyes 2 (two) times daily.    [provider]  famotidine (PEPCID) 20 MG tablet Take 1 tablet (20 mg total) by mouth daily. In am 06/15/20   Venia Carbon, MD  folic acid (FOLVITE) 1 MG tablet Take 1 tablet (1 mg total) by mouth daily. 01/07/18   Viviana Simpler I, MD  glucose blood (ONETOUCH VERIO) test strip Use to check blood sugar once a day. Dx Code E11.9 01/15/19   Viviana Simpler I, MD  lisinopril (ZESTRIL) 10 MG tablet Take 1 tablet (10 mg total) by mouth daily. 11/08/20   Venia Carbon, MD  metFORMIN (GLUCOPHAGE) 500 MG tablet Take 2 tablets (1,000 mg total) by mouth 2 (two) times daily with a meal. 04/30/20   Venia Carbon, MD  methotrexate (RHEUMATREX) 2.5 MG tablet Take 7 tablets (17.5 mg total) by mouth once a week. Caution:Chemotherapy. Protect from light. 09/21/20   Venia Carbon, MD  mirabegron ER (MYRBETRIQ) 25 MG TB24 tablet Take 1 tablet (25 mg total) by mouth daily. 07/19/20   Venia Carbon, MD  OneTouch Delica Lancets 69G MISC 1 each by Does not apply route daily. Dx Code E11.9 01/15/19   Venia Carbon, MD  QUEtiapine (SEROQUEL) 50 MG tablet TAKE 1 TABLET BY MOUTH AT  BEDTIME 07/28/20   Venia Carbon, MD  vitamin B-12 (CYANOCOBALAMIN) 1000 MCG tablet Take 1,000 mcg by mouth daily.    [provider]  vitamin E 400 UNIT capsule Take 400 Units by mouth daily.    [provider]  ziprasidone (GEODON) 80 MG capsule TAKE 1 TO 2 CAPSULES BY  MOUTH DAILY 06/30/19   Venia Carbon, MD    Physical Exam: Vitals:   11/13/20 2046 11/13/20 2051 11/13/20 2055 11/13/20 2300  BP:  117/60  (!) 111/58  Pulse:  (!) 117 (!) 116 (!) 104  Resp:  (!) 22  18  Temp:  98.5 F (36.9 C) (!) 101.3 F (38.5 C)   TempSrc:  Oral Rectal   SpO2:  97% 100% 99%  Weight: 60.8 kg     Height: 5' 4" (1.626 m)       Physical Exam Vitals and nursing note reviewed.  Constitutional:      General: She is not in acute distress.    Appearance: She is not ill-appearing.  HENT:     Head:  Normocephalic and atraumatic.     Right Ear: External ear normal.     Left Ear: External ear normal.     Mouth/Throat:     Mouth: Mucous membranes are moist.  Eyes:     Pupils: Pupils are equal, round, and reactive to light.  Cardiovascular:  Rate and Rhythm: Normal rate and regular rhythm.     Pulses: Normal pulses.     Heart sounds: Murmur heard.   Systolic murmur is present with a grade of 3/6.     Comments: 3/6 holosystolic murmur in pulmonic area.   Pulmonary:     Effort: Pulmonary effort is normal. No respiratory distress.     Breath sounds: Normal breath sounds.  Abdominal:     General: Bowel sounds are normal. There is no distension.     Palpations: Abdomen is soft. There is no mass.  Musculoskeletal:     Right lower leg: No edema.     Left lower leg: No edema.  Neurological:     Mental Status: She is lethargic.     Cranial Nerves: Cranial nerves are intact.    Labs on Admission: I have personally reviewed following labs and imaging studies  No results for input(s): CKTOTAL, CKMB, TROPONINI in the last 72 hours. Lab Results  Component Value Date   WBC 4.6 11/13/2020   HGB 10.9 (L) 11/13/2020   HCT 31.9 (L) 11/13/2020   MCV 93.8 11/13/2020   PLT 180 11/13/2020    Recent Labs  Lab 11/13/20 2105  NA 137  K 3.8  CL 105  CO2 20*  BUN 23  CREATININE 0.85  CALCIUM 8.9  PROT 6.7  BILITOT 0.7  ALKPHOS 97  ALT 19  AST 26  GLUCOSE 211*   Lab Results  Component Value Date   CHOL 112 07/19/2020   HDL 53.00 07/19/2020   LDLCALC 42 07/19/2020   TRIG 85.0 07/19/2020   No results found for: DDIMER Invalid input(s): POCBNP  Urinalysis    Component Value Date/Time   COLORURINE YELLOW (A) 11/13/2020 2044   APPEARANCEUR CLEAR (A) 11/13/2020 2044   LABSPEC 1.024 11/13/2020 2044   PHURINE 6.0 11/13/2020 2044   GLUCOSEU 50 (A) 11/13/2020 2044   HGBUR NEGATIVE 11/13/2020 2044   HGBUR small 09/19/2007 1215   BILIRUBINUR NEGATIVE 11/13/2020 2044    BILIRUBINUR Negative 02/04/2019 1452   KETONESUR 5 (A) 11/13/2020 2044   PROTEINUR 30 (A) 11/13/2020 2044   UROBILINOGEN 0.2 02/04/2019 1452   UROBILINOGEN 0.2 09/19/2007 1215   NITRITE NEGATIVE 11/13/2020 2044   LEUKOCYTESUR NEGATIVE 11/13/2020 2044   COVID-19 Labs No results for input(s): DDIMER, FERRITIN, LDH, CRP in the last 72 hours.  Lab Results  Component Value Date   [place] (A) 11/13/2020   [place] NEGATIVE 06/20/2019    Radiological Exams on Admission: DG Chest Port 1 View  Result Date: 11/13/2020 CLINICAL DATA:  Questionable sepsis, evaluate for abnormality EXAM: PORTABLE CHEST 1 VIEW COMPARISON:  CT 01/26/2015, radiograph 01/12/2015 FINDINGS: Some hazy interstitial opacities are present in both lung with few peripheral septal lines and indistinct pulmonary vascularity. No pneumothorax or visible effusion. The aorta is calcified. The remaining cardiomediastinal contours are unremarkable for portable technique. Telemetry leads overlie the chest. No acute osseous or soft tissue abnormality. IMPRESSION: Some hazy interstitial opacities in both lung with few peripheral septal lines and indistinct pulmonary vascularity. Findings could reflect mild edema versus atypical infection given a clinical concern for sepsis. Electronically Signed   By: Lovena Le M.D.   On: 11/13/2020 21:52    EKG: Independently reviewed.  S.tach at 117/ pvc's.   Assessment/Plan Principal Problem:   AMS (altered mental status) Active Problems:   Pneumonia due to COVID-19 virus   Type 2 diabetes mellitus with circulatory disorder (HCC)   Seropositive rheumatoid arthritis ([place])  Gastroesophageal reflux disease   Essential hypertension, benign   OAB (overactive bladder)   Sepsis (HCC)   Anemia   AMS: -pt is somnolent on exam and d/d include metabolic encephalopathy. -supportive care with neuro checks. -[place] head ct.  Covid-19 pneumonia: -Pt found to be covid positive in ed and  with chest xray being positive and pt being sob and symptomatic with murmur will get cta chest and 2 d echo. -pt started on antiviral protocol with remdesivir and steroids therapy.  -Covid-19 isolation in negative pressure room. -Monitor pulse oximetry and supplemental oxygen with goal o2 sats above 94%. -SpO2: 99 %   RA: - methotrexate to be resuemd once med rec done by pharmacy.    DM II: -ssi/ accuchecks/ coverage q6 as pt is somnolent and will make npo until coherent.  -d51/2 ns overnight till pt is able to take po and accucehc q6 hour.s  GERD: -Iv ppi.  HTN: -home Bp meds held due to low normal bp with 1 l  Bolus. -hold lisinopril and restart when BP is stable.  OAB: -myrbetriq continued when pt is alert and able to take po.  Sepsis: Pt meets sepsis criteria on arrival and pt is empirically also started on rocephin and azithromycin which pt has received  And we will continue until cta chest is back. Initial lactic was 1.7 and second lactic is 2.1 and we will give 1L ns bolus in addition to d51/2 ns. Thereafter.  Anemia: New anemia, anemia panel. Stool occult, type and screen. IV ppi therapy. Daily cbc or h/h. Last colonoscopy in 2019 showed diverticulosis in record of egd in chart/ heme consult etc.   DVT prophylaxis:  Lovenox 40.  Code Status:  Full code   Family Communication:  Mennen,Larry (Spouse)  [phone] (Home Phone)   Disposition Plan:  Home   Consults called:  None  Admission status: Inpatient    Para Skeans MD [facility] [phone] How to contact the [facility] Attending or Consulting provider    1. Check the care team in [facility] and look for a) attending/consulting TR[facility] provider listed and b) the [facility] team listed 2. Log into [email or web address] and use [place]'s universal password to access. If you do not have the password, please contact the hospital operator. 3. Locate the [facility] provider you are looking for under [facility] and  page to a number that you can be directly reached. 4. If you still have difficulty reaching the provider, please page the [facility] (Director on Call) for the Hospitalists listed on amion for assistance. [email or web address] Password [email or web address] 11/14/2020, 12:08 AM

## 2020-11-13 NOTE — Progress Notes (Signed)
Elink following for sepsis protocol. 

## 2020-11-13 NOTE — H&P (Incomplete)
History and Physical    Diana Roberson EHO:[id number] DOB: 01-23-1946 DOA: 11/13/2020  PCP: Diana Carbon, MD    Patient coming from:  Home   Chief Complaint:  AMS   HPI: Diana Roberson is a 75 y.o. female brought to the emergency room for not acting right all day by spouse.  On arrival patient met Code sepsis / Sepsis protocol initiated.Also has been weak and decreased po intake. Pt on exam is lethargic and somnolent difficult to arouse  and minimally answers questions but falls back asleep. Patient has medical history significant of diabetes mellitus type 2, hypertension, rheumatoid arthritis, glaucoma, hyperlipidemia, overactive bladder, GERD, carotid bruit. ED Course:  Vitals:   11/13/20 2046 11/13/20 2051 11/13/20 2055 11/13/20 2300  BP:  117/60  (!) 111/58  Pulse:  (!) 117 (!) 116 (!) 104  Resp:  (!) 22  18  Temp:  98.5 F (36.9 C) (!) 101.3 F (38.5 C)   TempSrc:  Oral Rectal   SpO2:  97% 100% 99%  Weight: 60.8 kg     Height: 5' 4" (1.626 m)     In the ED patient meets sepsis criteria with fever tachycardia dyspnea, suspected source of infection, initial lactic of 1.7.  CBC shows normal white count of 4.6, hemoglobin of 10.9, hematocrit of 31.9, MCV 93.8, platelets of 180.  CMP is within normal limits except for glucose of 211.  Patient is found to be Covid positive in the emergency room. Pt received Remdesivir in ed and also covered with rocephin and azithromycin.Pt is not hypoxic, but does reports SOB and cough. Chest xray shows infiltrate.  Review of Systems:  Review of Systems  Unable to perform ROS: Other (Pt extremely somnolent. )   Past Medical History:  Diagnosis Date  . Allergy   . Bipolar 1 disorder ([place])   . Chronic back pain   . Diabetes mellitus   . Difficult intubation   . Diverticulitis   . GERD (gastroesophageal reflux disease)   . Glaucoma   . Hyperlipidemia   . Hypertension   . Mental health disorder    admitted x3  . Osteopenia   .  Rheumatoid arthritis [facility])     Past Surgical History:  Procedure Laterality Date  . APPENDECTOMY    . BACK SURGERY  1988  . BREAST CYST ASPIRATION     neg  . CHOLECYSTECTOMY    . COLONOSCOPY    . COLONOSCOPY WITH PROPOFOL N/A 11/27/2017   Procedure: COLONOSCOPY WITH PROPOFOL;  Surgeon: Lollie Sails, MD;  Location: [facility] ENDOSCOPY;  Service: Endoscopy;  Laterality: N/A;  . HAND SURGERY    . ORIF ELBOW FRACTURE Right 06/24/2019   Procedure: OPEN REDUCTION INTERNAL FIXATION (ORIF) ELBOW/OLECRANON FRACTURE;  Surgeon: Corky Mull, MD;  Location: [facility] ORS;  Service: Orthopedics;  Laterality: Right;     reports that she has never smoked. She has never used smokeless tobacco. She reports that she does not drink alcohol and does not use drugs.  Allergies  Allergen Reactions  . Codeine Sulfate Other (See Comments)    Indigestion and epigastric pain  . Sertraline Hcl Other (See Comments)    REACTION: Worse, ? irritable    Family History  Problem Relation Age of Onset  . Diabetes Mother   . Hypertension Mother   . Stroke Sister   . Hashimoto's thyroiditis Daughter   . Stroke Father   . Stroke Sister   . Cancer Neg Hx   . Breast cancer  Neg Hx     Prior to Admission medications   Medication Sig Start Date End Date Taking? Authorizing Provider  aspirin EC 81 MG tablet Take 81 mg by mouth daily.    [provider]  atorvastatin (LIPITOR) 20 MG tablet TAKE 1 TABLET BY MOUTH  DAILY 09/02/20   Viviana Simpler I, MD  brimonidine (ALPHAGAN) 0.2 % ophthalmic solution Place 1 drop into both eyes 2 (two) times daily.    [provider]  calcium gluconate 500 MG tablet Take 1 tablet by mouth 2 (two) times daily.    [provider]  cholecalciferol (VITAMIN D) 1000 UNITS tablet Take 1,000 Units by mouth daily.    [provider]  dorzolamide-timolol (COSOPT) 22.3-6.8 MG/ML ophthalmic solution Place 1 drop into both eyes 2 (two) times daily.    [provider]  famotidine (PEPCID) 20 MG tablet Take 1 tablet (20 mg total) by mouth daily. In am 06/15/20   Diana Carbon, MD  folic acid (FOLVITE) 1 MG tablet Take 1 tablet (1 mg total) by mouth daily. 01/07/18   Viviana Simpler I, MD  glucose blood (ONETOUCH VERIO) test strip Use to check blood sugar once a day. Dx Code E11.9 01/15/19   Viviana Simpler I, MD  lisinopril (ZESTRIL) 10 MG tablet Take 1 tablet (10 mg total) by mouth daily. 11/08/20   Diana Carbon, MD  metFORMIN (GLUCOPHAGE) 500 MG tablet Take 2 tablets (1,000 mg total) by mouth 2 (two) times daily with a meal. 04/30/20   Diana Carbon, MD  methotrexate (RHEUMATREX) 2.5 MG tablet Take 7 tablets (17.5 mg total) by mouth once a week. Caution:Chemotherapy. Protect from light. 09/21/20   Diana Carbon, MD  mirabegron ER (MYRBETRIQ) 25 MG TB24 tablet Take 1 tablet (25 mg total) by mouth daily. 07/19/20   Diana Carbon, MD  OneTouch Delica Lancets 69G MISC 1 each by Does not apply route daily. Dx Code E11.9 01/15/19   Diana Carbon, MD  QUEtiapine (SEROQUEL) 50 MG tablet TAKE 1 TABLET BY MOUTH AT  BEDTIME 07/28/20   Diana Carbon, MD  vitamin B-12 (CYANOCOBALAMIN) 1000 MCG tablet Take 1,000 mcg by mouth daily.    [provider]  vitamin E 400 UNIT capsule Take 400 Units by mouth daily.    [provider]  ziprasidone (GEODON) 80 MG capsule TAKE 1 TO 2 CAPSULES BY  MOUTH DAILY 06/30/19   Diana Carbon, MD    Physical Exam: Vitals:   11/13/20 2046 11/13/20 2051 11/13/20 2055 11/13/20 2300  BP:  117/60  (!) 111/58  Pulse:  (!) 117 (!) 116 (!) 104  Resp:  (!) 22  18  Temp:  98.5 F (36.9 C) (!) 101.3 F (38.5 C)   TempSrc:  Oral Rectal   SpO2:  97% 100% 99%  Weight: 60.8 kg     Height: 5' 4" (1.626 m)       Physical Exam Vitals and nursing note reviewed.  Constitutional:      General: She is not in acute distress.    Appearance: She is not ill-appearing.  HENT:     Head:  Normocephalic and atraumatic.     Right Ear: External ear normal.     Left Ear: External ear normal.     Mouth/Throat:     Mouth: Mucous membranes are moist.  Eyes:     Pupils: Pupils are equal, round, and reactive to light.  Cardiovascular:  Rate and Rhythm: Normal rate and regular rhythm.     Pulses: Normal pulses.     Heart sounds: Murmur heard.   Systolic murmur is present with a grade of 3/6.     Comments: 3/6 holosystolic murmur in pulmonic area.   Pulmonary:     Effort: Pulmonary effort is normal. No respiratory distress.     Breath sounds: Normal breath sounds.  Abdominal:     General: Bowel sounds are normal. There is no distension.     Palpations: Abdomen is soft. There is no mass.  Musculoskeletal:     Right lower leg: No edema.     Left lower leg: No edema.  Neurological:     Mental Status: She is lethargic.     Cranial Nerves: Cranial nerves are intact.    Labs on Admission: I have personally reviewed following labs and imaging studies  No results for input(s): CKTOTAL, CKMB, TROPONINI in the last 72 hours. Lab Results  Component Value Date   WBC 4.6 11/13/2020   HGB 10.9 (L) 11/13/2020   HCT 31.9 (L) 11/13/2020   MCV 93.8 11/13/2020   PLT 180 11/13/2020    Recent Labs  Lab 11/13/20 2105  NA 137  K 3.8  CL 105  CO2 20*  BUN 23  CREATININE 0.85  CALCIUM 8.9  PROT 6.7  BILITOT 0.7  ALKPHOS 97  ALT 19  AST 26  GLUCOSE 211*   Lab Results  Component Value Date   CHOL 112 07/19/2020   HDL 53.00 07/19/2020   LDLCALC 42 07/19/2020   TRIG 85.0 07/19/2020   No results found for: DDIMER Invalid input(s): POCBNP  Urinalysis    Component Value Date/Time   COLORURINE YELLOW (A) 11/13/2020 2044   APPEARANCEUR CLEAR (A) 11/13/2020 2044   LABSPEC 1.024 11/13/2020 2044   PHURINE 6.0 11/13/2020 2044   GLUCOSEU 50 (A) 11/13/2020 2044   HGBUR NEGATIVE 11/13/2020 2044   HGBUR small 09/19/2007 1215   BILIRUBINUR NEGATIVE 11/13/2020 2044    BILIRUBINUR Negative 02/04/2019 1452   KETONESUR 5 (A) 11/13/2020 2044   PROTEINUR 30 (A) 11/13/2020 2044   UROBILINOGEN 0.2 02/04/2019 1452   UROBILINOGEN 0.2 09/19/2007 1215   NITRITE NEGATIVE 11/13/2020 2044   LEUKOCYTESUR NEGATIVE 11/13/2020 2044   COVID-19 Labs No results for input(s): DDIMER, FERRITIN, LDH, CRP in the last 72 hours.  Lab Results  Component Value Date   [place] (A) 11/13/2020   [place] NEGATIVE 06/20/2019    Radiological Exams on Admission: DG Chest Port 1 View  Result Date: 11/13/2020 CLINICAL DATA:  Questionable sepsis, evaluate for abnormality EXAM: PORTABLE CHEST 1 VIEW COMPARISON:  CT 01/26/2015, radiograph 01/12/2015 FINDINGS: Some hazy interstitial opacities are present in both lung with few peripheral septal lines and indistinct pulmonary vascularity. No pneumothorax or visible effusion. The aorta is calcified. The remaining cardiomediastinal contours are unremarkable for portable technique. Telemetry leads overlie the chest. No acute osseous or soft tissue abnormality. IMPRESSION: Some hazy interstitial opacities in both lung with few peripheral septal lines and indistinct pulmonary vascularity. Findings could reflect mild edema versus atypical infection given a clinical concern for sepsis. Electronically Signed   By: Lovena Le M.D.   On: 11/13/2020 21:52    EKG: Independently reviewed.  S.tach at 117/ pvc's.   Assessment/Plan Principal Problem:   AMS (altered mental status) Active Problems:   Pneumonia due to COVID-19 virus   Type 2 diabetes mellitus with circulatory disorder (HCC)   Seropositive rheumatoid arthritis ([place])  Gastroesophageal reflux disease   Essential hypertension, benign   OAB (overactive bladder)   Sepsis (HCC)   Anemia   AMS: -pt is somnolent on exam and d/d include metabolic encephalopathy. -supportive care with neuro checks. -[place] head ct.  Covid-19 pneumonia: -Pt found to be covid positive in ed and  with chest xray being positive and pt being sob and symptomatic with murmur will get cta chest.  -pt started on antiviral protocol with remdesivir and steroids therapy.  -Covid-19 isolation in negative pressure room. -Monitor pulse oximetry and goal o2 sats above 94%.  RA: - methotrexate to be resuemd once med rec done by pharmacy.    DM II: -ssi/ accuchecks/ coverage q6 as pt is somnolent and will make npo until coherent.    GERD: -Iv ppi.  HTN: -home continued but npo until alert and until then use hydralazine.   OAB: -myrbetriq continued when pt is alert and able to take po.  Sepsis: Pt meets sepsis criteria on arrival and pt is empirically also started on rocephin and azithromycin which pt has received  And we will continue until cta chest is back.  Anemia: New anemia, anemia panel. Stool occult, type and screen. IV ppi therapy.   DVT prophylaxis:  Heparin  Code Status:  Full code   Family Communication:  Kosmicki,Larry (Spouse)  [phone] (Home Phone)   Disposition Plan:  Home   Consults called:  None  Admission status: Inpatient    Para Skeans MD [facility] [phone] How to contact the [facility] Attending or Consulting provider    1. Check the care team in [facility] and look for a) attending/consulting [facility] provider listed and b) the [facility] team listed 2. Log into [email or web address] and use [place]'s universal password to access. If you do not have the password, please contact the hospital operator. 3. Locate the [facility] provider you are looking for under [facility] and page to a number that you can be directly reached. 4. If you still have difficulty reaching the provider, please page the [facility] (Director on Call) for the Hospitalists listed on amion for assistance. [email or web address] Password [facility] 11/13/2020, 11:54 PM

## 2020-11-13 NOTE — ED Provider Notes (Addendum)
[facility] Emergency Department Provider Note  ____________________________________________  Time seen: Approximately 10:54 PM  I have reviewed the triage vital signs and the nursing notes.   HISTORY  Chief Complaint Altered Mental Status    HPI Diana Roberson is a 75 y.o. female with a history of bipolar disorder diabetes GERD hypertension rheumatoid arthritis who is brought to the ED due to generalized weakness.  Patient reports some shortness of breath and nonproductive cough as well.  Denies pain.  Has decreased oral intake due to loss of appetite.  Husband at bedside reports that patient has been unable to support her own weight due to weakness.  She normally uses a walker and today he would have to hold her in the walker to try to move her.   No vomiting or diarrhea.  Symptoms are constant, no aggravating or alleviating factors.     Past Medical History:  Diagnosis Date  . Allergy   . Bipolar 1 disorder (HCC)   . Chronic back pain   . Diabetes mellitus   . Difficult intubation   . Diverticulitis   . GERD (gastroesophageal reflux disease)   . Glaucoma   . Hyperlipidemia   . Hypertension   . Mental health disorder    admitted x3  . Osteopenia   . Rheumatoid arthritis [facility])      Patient Active Problem List   Diagnosis Date Noted  . Gait disorder 11/08/2020  . OAB (overactive bladder) 07/19/2020  . Essential hypertension, benign 01/15/2020  . Plantar wart of left foot 01/13/2019  . Gastroesophageal reflux disease 11/01/2017  . Type 2 diabetes mellitus with circulatory disorder (HCC) 12/28/2015  . Seropositive rheumatoid arthritis (HCC) 12/28/2015  . Glaucoma 04/30/2015  . Advance directive discussed with patient 04/30/2015  . Routine general medical examination at a health care facility 12/06/2012  . CAROTID BRUIT 11/08/2010  . Bipolar 1 disorder, manic, full remission (HCC) 10/01/2009  . OSTEOPENIA 07/26/2007  . Hyperlipemia  01/22/2007  . DIVERTICULOSIS, COLON 01/08/2007  . Chronic back pain 01/08/2007     Past Surgical History:  Procedure Laterality Date  . APPENDECTOMY    . BACK SURGERY  1988  . BREAST CYST ASPIRATION     neg  . CHOLECYSTECTOMY    . COLONOSCOPY    . COLONOSCOPY WITH PROPOFOL N/A 11/27/2017   Procedure: COLONOSCOPY WITH PROPOFOL;  Surgeon: Christena Deem, MD;  Location: [facility] ENDOSCOPY;  Service: Endoscopy;  Laterality: N/A;  . HAND SURGERY    . ORIF ELBOW FRACTURE Right 06/24/2019   Procedure: OPEN REDUCTION INTERNAL FIXATION (ORIF) ELBOW/OLECRANON FRACTURE;  Surgeon: Christena Flake, MD;  Location: [facility] ORS;  Service: Orthopedics;  Laterality: Right;     Prior to Admission medications   Medication Sig Start Date End Date Taking? Authorizing Provider  aspirin EC 81 MG tablet Take 81 mg by mouth daily.    [provider]  atorvastatin (LIPITOR) 20 MG tablet TAKE 1 TABLET BY MOUTH  DAILY 09/02/20   Tillman Abide I, MD  brimonidine (ALPHAGAN) 0.2 % ophthalmic solution Place 1 drop into both eyes 2 (two) times daily.    [provider]  calcium gluconate 500 MG tablet Take 1 tablet by mouth 2 (two) times daily.    [provider]  cholecalciferol (VITAMIN D) 1000 UNITS tablet Take 1,000 Units by mouth daily.    [provider]  dorzolamide-timolol (COSOPT) 22.3-6.8 MG/ML ophthalmic solution Place 1 drop into both eyes 2 (two) times daily.  [provider]  famotidine (PEPCID) 20 MG tablet Take 1 tablet (20 mg total) by mouth daily. In am 06/15/20   Karie Schwalbe, MD  folic acid (FOLVITE) 1 MG tablet Take 1 tablet (1 mg total) by mouth daily. 01/07/18   Tillman Abide I, MD  glucose blood (ONETOUCH VERIO) test strip Use to check blood sugar once a day. Dx Code E11.9 01/15/19   Tillman Abide I, MD  lisinopril (ZESTRIL) 10 MG tablet Take 1 tablet (10 mg total) by mouth daily. 11/08/20   Karie Schwalbe, MD  metFORMIN (GLUCOPHAGE) 500 MG  tablet Take 2 tablets (1,000 mg total) by mouth 2 (two) times daily with a meal. 04/30/20   Karie Schwalbe, MD  methotrexate (RHEUMATREX) 2.5 MG tablet Take 7 tablets (17.5 mg total) by mouth once a week. Caution:Chemotherapy. Protect from light. 09/21/20   Karie Schwalbe, MD  mirabegron ER (MYRBETRIQ) 25 MG TB24 tablet Take 1 tablet (25 mg total) by mouth daily. 07/19/20   Karie Schwalbe, MD  OneTouch Delica Lancets 33G MISC 1 each by Does not apply route daily. Dx Code E11.9 01/15/19   Karie Schwalbe, MD  QUEtiapine (SEROQUEL) 50 MG tablet TAKE 1 TABLET BY MOUTH AT  BEDTIME 07/28/20   Karie Schwalbe, MD  vitamin B-12 (CYANOCOBALAMIN) 1000 MCG tablet Take 1,000 mcg by mouth daily.    [provider]  vitamin E 400 UNIT capsule Take 400 Units by mouth daily.    [provider]  ziprasidone (GEODON) 80 MG capsule TAKE 1 TO 2 CAPSULES BY  MOUTH DAILY 06/30/19   Karie Schwalbe, MD     Allergies Codeine sulfate and Sertraline hcl   Family History  Problem Relation Age of Onset  . Diabetes Mother   . Hypertension Mother   . Stroke Sister   . Hashimoto's thyroiditis Daughter   . Stroke Father   . Stroke Sister   . Cancer Neg Hx   . Breast cancer Neg Hx     Social History Social History   Tobacco Use  . Smoking status: Never Smoker  . Smokeless tobacco: Never Used  Vaping Use  . Vaping Use: Never used  Substance Use Topics  . Alcohol use: No  . Drug use: No    Review of Systems  Constitutional:   No fever or chills.  Positive weakness ENT:   No sore throat. No rhinorrhea. Cardiovascular:   No chest pain or syncope. Respiratory:   No dyspnea or cough. Gastrointestinal:   Negative for abdominal pain, vomiting and diarrhea.  Musculoskeletal:   Negative for focal pain or swelling All other systems reviewed and are negative except as documented above in ROS and HPI.  ____________________________________________   PHYSICAL EXAM:  VITAL  SIGNS: ED Triage Vitals  Enc Vitals Group     BP 11/13/20 2051 117/60     Pulse Rate 11/13/20 2051 (!) 117     Resp 11/13/20 2051 (!) 22     Temp 11/13/20 2051 98.5 F (36.9 C)     Temp Source 11/13/20 2051 Oral     SpO2 11/13/20 2051 97 %     Weight 11/13/20 2046 134 lb (60.8 kg)     Height 11/13/20 2046  (1.626 m)     Head Circumference --      Peak Flow --      Pain Score --      Pain Loc --      Pain Edu? --  Excl. in GC? --     Vital signs reviewed, nursing assessments reviewed.   Constitutional:   Alert and oriented.  Ill-appearing. Eyes:   Conjunctivae are normal. EOMI. PERRL. ENT      Head:   Normocephalic and atraumatic.      Nose: Normal.      Mouth/Throat:   Dry mucous membranes      Neck:   No meningismus. Full ROM. Hematological/Lymphatic/Immunilogical:   No cervical lymphadenopathy. Cardiovascular:   Tachycardia heart rate 120. Symmetric bilateral radial and DP pulses.  No murmurs. Cap refill less than 2 seconds. Respiratory:   Tachypnea.  Clear breath sounds bilaterally.  No wheezing or crackles Gastrointestinal:   Soft and nontender. Non distended. There is no CVA tenderness.  No rebound, rigidity, or guarding. Musculoskeletal:   Normal range of motion in all extremities. No joint effusions.  No lower extremity tenderness.  No edema. Neurologic:   Normal speech and language.  Motor grossly intact. No acute focal neurologic deficits are appreciated.  Skin:    Skin is warm, dry and intact. No rash noted.  No petechiae, purpura, or bullae.  ____________________________________________    LABS (pertinent positives/negatives) (all labs ordered are listed, but only abnormal results are displayed) Labs Reviewed  RESP PANEL BY RT-PCR (FLU A&B, COVID) ARPGX2 - Abnormal; Notable for the following components:      Result Value   SARS Coronavirus 2 by RT PCR POSITIVE (*)    All other components within normal limits  COMPREHENSIVE METABOLIC PANEL -  Abnormal; Notable for the following components:   CO2 20 (*)    Glucose, Bld 211 (*)    All other components within normal limits  CBC WITH DIFFERENTIAL/PLATELET - Abnormal; Notable for the following components:   RBC 3.40 (*)    Hemoglobin 10.9 (*)    HCT 31.9 (*)    Lymphs Abs 0.6 (*)    All other components within normal limits  URINALYSIS, COMPLETE (UACMP) WITH MICROSCOPIC - Abnormal; Notable for the following components:   Color, Urine YELLOW (*)    APPearance CLEAR (*)    Glucose, UA 50 (*)    Ketones, ur 5 (*)    Protein, ur 30 (*)    All other components within normal limits  CULTURE, BLOOD (ROUTINE X 2)  CULTURE, BLOOD (ROUTINE X 2)  URINE CULTURE  LACTIC ACID, PLASMA  PROTIME-INR  APTT  LACTIC ACID, PLASMA  PROCALCITONIN   ____________________________________________   EKG    ____________________________________________    RADIOLOGY  DG Chest Port 1 View  Result Date: 11/13/2020 CLINICAL DATA:  Questionable sepsis, evaluate for abnormality EXAM: PORTABLE CHEST 1 VIEW COMPARISON:  CT 01/26/2015, radiograph 01/12/2015 FINDINGS: Some hazy interstitial opacities are present in both lung with few peripheral septal lines and indistinct pulmonary vascularity. No pneumothorax or visible effusion. The aorta is calcified. The remaining cardiomediastinal contours are unremarkable for portable technique. Telemetry leads overlie the chest. No acute osseous or soft tissue abnormality. IMPRESSION: Some hazy interstitial opacities in both lung with few peripheral septal lines and indistinct pulmonary vascularity. Findings could reflect mild edema versus atypical infection given a clinical concern for sepsis. Electronically Signed   By: Kreg Shropshire M.D.   On: 11/13/2020 21:52    ____________________________________________   PROCEDURES .Critical Care Performed by: Sharman Cheek, MD Authorized by: Sharman Cheek, MD   Critical care provider statement:    Critical  care time (minutes):  33   Critical care time was exclusive of:  Separately  billable procedures and treating other patients   Critical care was necessary to treat or prevent imminent or life-threatening deterioration of the following conditions:  Sepsis and dehydration   Critical care was time spent personally by me on the following activities:  Development of treatment plan with patient or surrogate, discussions with consultants, evaluation of patient's response to treatment, examination of patient, obtaining history from patient or surrogate, ordering and performing treatments and interventions, ordering and review of laboratory studies, ordering and review of radiographic studies, pulse oximetry, re-evaluation of patient's condition and review of old charts    ____________________________________________  DIFFERENTIAL DIAGNOSIS   Pneumonia, UTI, electrolyte abnormality, dehydration, Covid/flu/viral illness  CLINICAL IMPRESSION / ASSESSMENT AND PLAN / ED COURSE  Medications ordered in the ED: Medications  cefTRIAXone (ROCEPHIN) 1 g in sodium chloride 0.9 % 100 mL IVPB (0 g Intravenous Stopped 11/13/20 2238)  azithromycin (ZITHROMAX) 500 mg in sodium chloride 0.9 % 250 mL IVPB (500 mg Intravenous New Bag/Given 11/13/20 2242)  sodium chloride 0.9 % bolus 1,000 mL (0 mLs Intravenous Stopped 11/13/20 2241)    Pertinent labs & imaging results that were available during my care of the patient were reviewed by me and considered in my medical decision making (see chart for details).  Diana Roberson was evaluated in Emergency Department on 11/13/2020 for the symptoms described in the history of present illness. She was evaluated in the context of the global COVID-19 pandemic, which necessitated consideration that the patient might be at risk for infection with the SARS-CoV-2 virus that causes COVID-19. Institutional protocols and algorithms that pertain to the evaluation of patients at risk for COVID-19  are in a state of rapid change based on information released by regulatory bodies including the CDC and federal and state organizations. These policies and algorithms were followed during the patient's care in the ED.   Patient presents with fever tachycardia tachypnea and weakness.  Concerning for sepsis and most likely UTI.  Patient given Rocephin while obtaining lab panel.  Overall labs are reassuring, lactate is normal, chest x-ray does show some bilateral basilar infiltrates, and Covid is positive.  She is not hypoxic but with Covid pneumonia, age comorbidities and profound weakness, will give remdesivir and plan to admit.  Results discussed with husband at bedside, per hospital policy he is asked to leave as the patient would not be able to have visitors for now to avoid spreading infection. Counseled him on monitoring himself for symptoms of covid and seeking medical attention since he and his wife are both unvaccinated. .      ____________________________________________   FINAL CLINICAL IMPRESSION(S) / ED DIAGNOSES    Final diagnoses:  COVID-19 virus infection  Weakness  Seropositive rheumatoid arthritis (HCC)  Bipolar 1 disorder, manic, full remission (HCC)  Type 2 diabetes mellitus with other circulatory complication, without long-term current use of insulin [facility])     ED Discharge Orders    None      Portions of this note were generated with dragon dictation software. Dictation errors may occur despite best attempts at proofreading.   Sharman Cheek, MD 11/13/20 2258    Sharman Cheek, MD 11/13/20 2259

## 2020-11-13 NOTE — ED Triage Notes (Signed)
EMS reports call to home for "patient not acting right all day" by husband.

## 2020-11-13 NOTE — Consult Note (Signed)
CODE SEPSIS - PHARMACY COMMUNICATION  **Broad Spectrum Antibiotics should be administered within 1 hour of Sepsis diagnosis**  Time Code Sepsis Called/Page Received: 2109  Antibiotics Ordered: 2107  Time of 1st antibiotic administration: 2125  Additional action taken by pharmacy: N/A  If necessary, Name of Provider/Nurse Contacted: N/A    Derrek Gu ,PharmD Clinical Pharmacist  11/13/2020  9:31 PM

## 2020-11-13 NOTE — Progress Notes (Signed)
Remdesivir - Pharmacy Brief Note   O:  ALT: 19 CXR:  SpO2: 99 % on RA   A/P:  Remdesivir 200 mg IVPB once followed by 100 mg IVPB daily x 4 days.   Melysa Schroyer D 11/13/2020 11:07 PM

## 2020-11-14 ENCOUNTER — Inpatient Hospital Stay: Payer: Medicare Other

## 2020-11-14 DIAGNOSIS — R400 Somnolence: Secondary | ICD-10-CM | POA: Diagnosis not present

## 2020-11-14 DIAGNOSIS — J9811 Atelectasis: Secondary | ICD-10-CM | POA: Diagnosis not present

## 2020-11-14 DIAGNOSIS — R059 Cough, unspecified: Secondary | ICD-10-CM | POA: Diagnosis not present

## 2020-11-14 DIAGNOSIS — R0600 Dyspnea, unspecified: Secondary | ICD-10-CM | POA: Diagnosis not present

## 2020-11-14 LAB — FERRITIN
Ferritin: 33 ng/mL (ref 11–307)
Ferritin: 38 ng/mL (ref 11–307)

## 2020-11-14 LAB — COMPREHENSIVE METABOLIC PANEL
ALT: 18 U/L (ref 0–44)
AST: 22 U/L (ref 15–41)
Albumin: 3.4 g/dL — ABNORMAL LOW (ref 3.5–5.0)
Alkaline Phosphatase: 99 U/L (ref 38–126)
Anion gap: 7 (ref 5–15)
BUN: 14 mg/dL (ref 8–23)
CO2: 22 mmol/L (ref 22–32)
Calcium: 8.4 mg/dL — ABNORMAL LOW (ref 8.9–10.3)
Chloride: 108 mmol/L (ref 98–111)
Creatinine, Ser: 0.72 mg/dL (ref 0.44–1.00)
GFR, Estimated: >60 mL/min (ref >60)
Glucose, Bld: 222 mg/dL — ABNORMAL HIGH (ref 70–99)
Potassium: 4 mmol/L (ref 3.5–5.1)
Sodium: 137 mmol/L (ref 135–145)
Total Bilirubin: 0.6 mg/dL (ref 0.3–1.2)
Total Protein: 6.3 g/dL — ABNORMAL LOW (ref 6.5–8.1)

## 2020-11-14 LAB — CBC WITH DIFFERENTIAL/PLATELET
Abs Immature Granulocytes: 0.02 10*3/uL (ref 0.00–0.07)
Basophils Absolute: 0 10*3/uL (ref 0.0–0.1)
Basophils Relative: 0 %
Eosinophils Absolute: 0 10*3/uL (ref 0.0–0.5)
Eosinophils Relative: 0 %
HCT: 31.8 % — ABNORMAL LOW (ref 36.0–46.0)
Hemoglobin: 10.6 g/dL — ABNORMAL LOW (ref 12.0–15.0)
Immature Granulocytes: 1 %
Lymphocytes Relative: 16 %
Lymphs Abs: 0.4 10*3/uL — ABNORMAL LOW (ref 0.7–4.0)
MCH: 31.8 pg (ref 26.0–34.0)
MCHC: 33.3 g/dL (ref 30.0–36.0)
MCV: 95.5 fL (ref 80.0–100.0)
Monocytes Absolute: 0.1 10*3/uL (ref 0.1–1.0)
Monocytes Relative: 4 %
Neutro Abs: 2.2 10*3/uL (ref 1.7–7.7)
Neutrophils Relative %: 79 %
Platelets: 185 10*3/uL (ref 150–400)
RBC: 3.33 MIL/uL — ABNORMAL LOW (ref 3.87–5.11)
RDW: 13.9 % (ref 11.5–15.5)
WBC: 2.8 10*3/uL — ABNORMAL LOW (ref 4.0–10.5)
nRBC: 0 % (ref 0.0–0.2)

## 2020-11-14 LAB — CBC
HCT: 29.6 % — ABNORMAL LOW (ref 36.0–46.0)
Hemoglobin: 9.9 g/dL — ABNORMAL LOW (ref 12.0–15.0)
MCH: 31.9 pg (ref 26.0–34.0)
MCHC: 33.4 g/dL (ref 30.0–36.0)
MCV: 95.5 fL (ref 80.0–100.0)
Platelets: 173 10*3/uL (ref 150–400)
RBC: 3.1 MIL/uL — ABNORMAL LOW (ref 3.87–5.11)
RDW: 13.7 % (ref 11.5–15.5)
WBC: 4.5 10*3/uL (ref 4.0–10.5)
nRBC: 0 % (ref 0.0–0.2)

## 2020-11-14 LAB — MAGNESIUM: Magnesium: 1.4 mg/dL — ABNORMAL LOW (ref 1.7–2.4)

## 2020-11-14 LAB — TYPE AND SCREEN
ABO/RH(D): O POS
Antibody Screen: NEGATIVE

## 2020-11-14 LAB — HEMOGLOBIN A1C
Hgb A1c MFr Bld: 6.2 % — ABNORMAL HIGH (ref 4.8–5.6)
Mean Plasma Glucose: 131.24 mg/dL

## 2020-11-14 LAB — IRON AND TIBC
Iron: 51 ug/dL (ref 28–170)
Saturation Ratios: 16 % (ref 10.4–31.8)
TIBC: 323 ug/dL (ref 250–450)
UIBC: 272 ug/dL

## 2020-11-14 LAB — GLUCOSE, CAPILLARY
Glucose-Capillary: 272 mg/dL — ABNORMAL HIGH (ref 70–99)
Glucose-Capillary: 327 mg/dL — ABNORMAL HIGH (ref 70–99)

## 2020-11-14 LAB — PHOSPHORUS: Phosphorus: 3.4 mg/dL (ref 2.5–4.6)

## 2020-11-14 LAB — CREATININE, SERUM
Creatinine, Ser: 0.78 mg/dL (ref 0.44–1.00)
GFR, Estimated: >60 mL/min (ref >60)

## 2020-11-14 LAB — D-DIMER, QUANTITATIVE: D-Dimer, Quant: 1.44 ug/mL-FEU — ABNORMAL HIGH (ref 0.00–0.50)

## 2020-11-14 LAB — C-REACTIVE PROTEIN: CRP: 0.7 mg/dL (ref <1.0)

## 2020-11-14 LAB — CBG MONITORING, ED: Glucose-Capillary: 186 mg/dL — ABNORMAL HIGH (ref 70–99)

## 2020-11-14 LAB — TROPONIN I (HIGH SENSITIVITY)
Troponin I (High Sensitivity): 12 ng/L (ref <18)
Troponin I (High Sensitivity): 12 ng/L (ref <18)
Troponin I (High Sensitivity): 10 ng/L (ref <18)

## 2020-11-14 LAB — VITAMIN B12: Vitamin B-12: 258 pg/mL (ref 180–914)

## 2020-11-14 LAB — CK: Total CK: 52 U/L (ref 38–234)

## 2020-11-14 LAB — PROCALCITONIN: Procalcitonin: <0.1 ng/mL

## 2020-11-14 LAB — FOLATE: Folate: 12.2 ng/mL (ref >5.9)

## 2020-11-14 LAB — TSH: TSH: 0.901 u[IU]/mL (ref 0.350–4.500)

## 2020-11-14 LAB — T4, FREE: Free T4: 1.2 ng/dL — ABNORMAL HIGH (ref 0.61–1.12)

## 2020-11-14 MED ORDER — PANTOPRAZOLE SODIUM 40 MG IV SOLR
40.0000 mg | Freq: Two times a day (BID) | INTRAVENOUS | Status: DC
  Administered 2020-11-14 (×2): 40 mg via INTRAVENOUS
  Filled 2020-11-14 (×2): qty 40

## 2020-11-14 MED ORDER — INSULIN ASPART 100 UNIT/ML ~~LOC~~ SOLN
3.0000 [IU] | Freq: Once | SUBCUTANEOUS | Status: AC
  Administered 2020-11-14: 3 [IU] via SUBCUTANEOUS

## 2020-11-14 MED ORDER — PANTOPRAZOLE SODIUM 40 MG IV SOLR: 40.0000 mg | INTRAVENOUS | Status: DC

## 2020-11-14 MED ORDER — HYDRALAZINE HCL 20 MG/ML IJ SOLN: 5.0000 mg | INTRAMUSCULAR | Status: DC | PRN

## 2020-11-14 MED ORDER — ENOXAPARIN SODIUM 40 MG/0.4ML ~~LOC~~ SOLN
40.0000 mg | SUBCUTANEOUS | Status: DC
  Administered 2020-11-14: 40 mg via SUBCUTANEOUS
  Filled 2020-11-14: qty 0.4

## 2020-11-14 MED ORDER — SODIUM CHLORIDE 0.9 % IV BOLUS: 1000.0000 mL | Freq: Once | INTRAVENOUS | Status: DC

## 2020-11-14 MED ORDER — IOHEXOL 350 MG/ML SOLN
75.0000 mL | Freq: Once | INTRAVENOUS | Status: AC | PRN
  Administered 2020-11-14: 75 mL via INTRAVENOUS

## 2020-11-14 NOTE — Plan of Care (Signed)
  Problem: Clinical Measurements: Goal: Will remain free from infection Outcome: Progressing   Problem: Safety: Goal: Ability to remain free from injury will improve Outcome: Progressing   Problem: Respiratory: Goal: Will maintain a patent airway Outcome: Progressing   Problem: Respiratory: Goal: Complications related to the disease process, condition or treatment will be avoided or minimized Outcome: Progressing

## 2020-11-14 NOTE — Progress Notes (Signed)
Patient discharged at this time to home with spouse. Patient drowsy and wasn't interactive during discharge process, orientation status changed from AM assessment. MD aware. Teachings from AVS voiced with spouse and daughter. Both verbalized understanding. PIV/Tele removed. NAD noted upon departure.

## 2020-11-14 NOTE — Progress Notes (Signed)
Pt as admitted in the floor with no signs of distress. VSS. Pt only alert to self and place; unable to do screening assessment. Pt was educated about safety and ascom within pt reach. Will notify incoming shift. Will continue to monitor.

## 2020-11-14 NOTE — Discharge Summary (Signed)
Physician Discharge Summary   Diana Roberson  female DOB: 10-Dec-1945  MAY:[id number]  PCP: Venia Carbon, MD  Admit date: 11/13/2020 Discharge date: 11/14/2020  Admitted From: home Disposition:  Home. Husband updated on the phone about discharge plans prior to discharge.  Home Health: No  Husband didn't want [place] to order HHPT as pt's outpatient provider has already started setting pt up with [facility].  CODE STATUS: Full code  Discharge Instructions    Discharge instructions   Complete by: As directed    You have COVID infection, but no breathing problem or GI symptoms.  Your mental status is back to baseline.  Your weakness has been ongoing for several weeks, please continue physical therapy ordered by your outpatient doctor.  As we discussed, some people will have worse symptoms several weeks after initial COVID infection.  If you have more problem breathing, please see your doctor or return to the hospital.   Dr. Enzo Bi - -      49 Day Unplanned Readmission Risk Score   Flowsheet [facility] to Hosp-Admission (Current) from 11/13/2020 in [place] [facility]  30 Day Unplanned Readmission Risk Score (%) 22.88 Filed at 11/14/2020 0801     This score is the patient's risk of an unplanned readmission within 30 days of being discharged (0 -100%). The score is based on dignosis, age, lab data, medications, orders, and past utilization.   Low:  0-14.9   Medium: 15-21.9   High: 22-29.9   Extreme: 30 and above         Hospital Course:  For full details, please see H&P, progress notes, consult notes and ancillary notes.  Briefly,  Diana Roberson is a 75 y.o. female with medical history significant of diabetes mellitus type 2, hypertension, rheumatoid arthritis, glaucoma, hyperlipidemia, overactive bladder, GERD, carotid bruit who was brought to the emergency room for not acting right all day by spouse.    On arrival patient met Code sepsis / Sepsis protocol initiated.   Also has been weak and decreased po intake. Pt on exam was lethargic and somnolent difficult to arouse and minimally answers questions but falls back asleep.  Acute toxic encephalopathy  Head CT no acute finding.  Pt was lethargic and difficult to keep awake at times, however, when pt was awake, she was oriented x4.  Pt got to bedside commode and ate breakfast the day after presentation.  Pt's husband brought up that pt's home nightly seroquel was recently stopped by her outpatient doctor due to it being too sedating, so pt had not been sleeping well recently.  And then pt received Seroquel here in the hospital, likely contributing to her somnolence.    Covid-19 infection: CTA chest neg for PE or acute lung finding.  Pt maintained O2 sats even with walking.  Remdesivir and steroid d/c'ed prior to discharge.  RA: On methotrexate weekly at home.  DM II, well controlled Hyperglycemia due to steroid use A1c 6.2.  Pt discharged on home metformin.  GERD: Cont PPI  HTN: Home Lisinopril held during brief hospitalization, resumed at discharge.  Overactive bladder: Cont home myrbetriq   Sepsis, ruled out No source of infection.  CT chest clear.  UA neg for infection.  COVID infection asymptomatic.  Procal <0.1.  Abx d/c'ed.  Anemia: No def in iron or folate.  Vit B12 258.  Hgb stable 9-10's.  Weakness Pt reported ongoing weakness for past several weeks, and her outpatient doctor was aware and in  the process of setting up [facility] for pt.   Husband said pt has hammer toes which made walking more difficult.  PT eval rec SNF rehab, however, pt may be just too lethargic to have done well.  Husband wanted to take pt home as pt has a lot of support at home.  Husband also didn't want [place] to order HHPT as pt's outpatient provider has already started setting pt up with [facility].   Discharge Diagnoses:  Principal Problem:   AMS (altered mental status) Active Problems:   Type 2 diabetes mellitus with  circulatory disorder (HCC)   Seropositive rheumatoid arthritis (HCC)   Gastroesophageal reflux disease   Essential hypertension, benign   OAB (overactive bladder)   Pneumonia due to COVID-19 virus   Sepsis ([place])   Anemia    Discharge Instructions:  Allergies as of 11/14/2020      Reactions   Codeine Sulfate Other (See Comments)   Indigestion and epigastric pain   Sertraline Hcl Other (See Comments)   REACTION: Worse, ? irritable      Medication List    TAKE these medications   aspirin EC 81 MG tablet Take 81 mg by mouth daily.   atorvastatin 20 MG tablet Commonly known as: LIPITOR TAKE 1 TABLET BY MOUTH  DAILY   brimonidine 0.2 % ophthalmic solution Commonly known as: ALPHAGAN Place 1 drop into both eyes 2 (two) times daily.   calcium gluconate 500 MG tablet Take 1 tablet by mouth 2 (two) times daily.   cholecalciferol 1000 units tablet Commonly known as: VITAMIN D Take 1,000 Units by mouth daily.   dorzolamide-timolol 22.3-6.8 MG/ML ophthalmic solution Commonly known as: COSOPT Place 1 drop into both eyes 2 (two) times daily.   famotidine 20 MG tablet Commonly known as: PEPCID Take 1 tablet (20 mg total) by mouth daily. In am   folic acid 1 MG tablet Commonly known as: FOLVITE Take 1 tablet (1 mg total) by mouth daily.   gabapentin 300 MG capsule Commonly known as: NEURONTIN Take 300 mg by mouth at bedtime.   glucose blood test strip Commonly known as: OneTouch Verio Use to check blood sugar once a day. Dx Code E11.9   hydroxychloroquine 200 MG tablet Commonly known as: PLAQUENIL Take 200 mg by mouth daily.   lisinopril 10 MG tablet Commonly known as: ZESTRIL Take 1 tablet (10 mg total) by mouth daily. What changed: Another medication with the same name was removed. Continue taking this medication, and follow the directions you see here.   metFORMIN 500 MG tablet Commonly known as: GLUCOPHAGE Take 2 tablets (1,000 mg total) by mouth 2 (two)  times daily with a meal.   methotrexate 2.5 MG tablet Commonly known as: RHEUMATREX Take 7 tablets (17.5 mg total) by mouth once a week. Caution:Chemotherapy. Protect from light.   mirabegron ER 25 MG Tb24 tablet Commonly known as: MYRBETRIQ Take 1 tablet (25 mg total) by mouth daily.   OneTouch Delica Lancets 74Y Misc 1 each by Does not apply route daily. Dx Code E11.9   QUEtiapine 50 MG tablet Commonly known as: SEROQUEL TAKE 1 TABLET BY MOUTH AT  BEDTIME   vitamin B-12 1000 MCG tablet Commonly known as: CYANOCOBALAMIN Take 1,000 mcg by mouth daily.   vitamin E 180 MG (400 UNITS) capsule Take 400 Units by mouth daily.   ziprasidone 80 MG capsule Commonly known as: GEODON TAKE 1 TO 2 CAPSULES BY  MOUTH DAILY        Follow-up Information  Venia Carbon, MD. Schedule an appointment as soon as possible for a visit in 1 week(s).   Specialties: Internal Medicine, Pediatrics Contact information: [place] [address] [phone]               Allergies  Allergen Reactions  . Codeine Sulfate Other (See Comments)    Indigestion and epigastric pain  . Sertraline Hcl Other (See Comments)    REACTION: Worse, ? irritable     The results of significant diagnostics from this hospitalization (including imaging, microbiology, ancillary and laboratory) are listed below for reference.   Consultations:   Procedures/Studies: CT HEAD WO CONTRAST  Result Date: 11/14/2020 CLINICAL DATA:  Altered mental status EXAM: CT HEAD WITHOUT CONTRAST TECHNIQUE: Contiguous axial images were obtained from the base of the skull through the vertex without intravenous contrast. COMPARISON:  None. FINDINGS: Brain: Normal anatomic configuration. Parenchymal volume loss is commensurate with the patient's age. Moderate periventricular white matter changes are present likely reflecting the sequela of small vessel ischemia. 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 No abnormal intra or extra-axial mass lesion or fluid collection. No abnormal mass effect or midline shift. No evidence of acute intracranial hemorrhage or infarct. Ventricular size is normal. Cerebellum unremarkable. Vascular: No asymmetric hyperdense vasculature at the skull base. Skull: Intact Sinuses/Orbits: Paranasal sinuses are clear. Orbits are unremarkable. Other: Mastoid air cells and middle ear cavities are clear. IMPRESSION: No acute intracranial abnormality. Electronically Signed   By: Fidela Salisbury MD   On: 11/14/2020 03:02   CT ANGIO CHEST PE W OR WO CONTRAST  Result Date: 11/14/2020 CLINICAL DATA:  Dyspnea, nonproductive cough EXAM: CT ANGIOGRAPHY CHEST WITH CONTRAST TECHNIQUE: Multidetector CT imaging of the chest was performed using the standard protocol during bolus administration of intravenous contrast. Multiplanar CT image reconstructions and MIPs were obtained to evaluate the vascular anatomy. CONTRAST:  61m OMNIPAQUE IOHEXOL 350 MG/ML SOLN COMPARISON:  01/26/2015 FINDINGS: Cardiovascular: Imaging is slightly limited by motion artifact affecting the terminal segmental arteries within the lung bases bilaterally. The pulmonary arterial tree is adequately opacified. There is no intraluminal filling defect identified to suggest acute pulmonary embolism. The central pulmonary arteries are of normal caliber. Cardiac size within normal limits. No pericardial effusion. No significant coronary artery calcification. Mild atherosclerotic calcification within the thoracic aorta. No aortic aneurysm. Mediastinum/Nodes: No pathologic thoracic adenopathy. Esophagus unremarkable. Thyroid unremarkable. Lungs/Pleura: Pulmonary hypoinflation in combination with expiratory phase imaging contributes to mosaic attenuation within the lung apices likely reflecting atelectasis in combination with mild air trapping. Mild bibasilar atelectasis. The lungs are otherwise clear. Central airways are widely  patent. No pneumothorax or pleural effusion. Upper Abdomen: No acute abnormality. Musculoskeletal: No acute bone abnormality. Review of the MIP images confirms the above findings. IMPRESSION: No pulmonary embolism. No radiographic evidence of acute cardiopulmonary disease. Aortic Atherosclerosis (ICD10-I70.0). Electronically Signed   By: AFidela SalisburyMD   On: 11/14/2020 03:16   DG Chest Port 1 View  Result Date: 11/13/2020 CLINICAL DATA:  Questionable sepsis, evaluate for abnormality EXAM: PORTABLE CHEST 1 VIEW COMPARISON:  CT 01/26/2015, radiograph 01/12/2015 FINDINGS: Some hazy interstitial opacities are present in both lung with few peripheral septal lines and indistinct pulmonary vascularity. No pneumothorax or visible effusion. The aorta is calcified. The remaining cardiomediastinal contours are unremarkable for portable technique. Telemetry leads overlie the chest. No acute osseous or soft tissue abnormality. IMPRESSION: Some hazy interstitial opacities in both lung with few peripheral septal lines and indistinct  pulmonary vascularity. Findings could reflect mild edema versus atypical infection given a clinical concern for sepsis. Electronically Signed   By: Lovena Le M.D.   On: 11/13/2020 21:52      Labs: BNP (last 3 results) No results for input(s): BNP in the last 8760 hours. Basic Metabolic Panel: Recent Labs  Lab 11/13/20 2105 11/13/20 2359 11/14/20 0412  NA 137  --  137  K 3.8  --  4.0  CL 105  --  108  CO2 20*  --  22  GLUCOSE 211*  --  222*  BUN 23  --  14  CREATININE 0.85 0.78 0.72  CALCIUM 8.9  --  8.4*  MG  --  1.4*  --   PHOS  --  3.4  --    Liver Function Tests: Recent Labs  Lab 11/13/20 2105 11/14/20 0412  AST 26 22  ALT 19 18  ALKPHOS 97 99  BILITOT 0.7 0.6  PROT 6.7 6.3*  ALBUMIN 3.5 3.4*   No results for input(s): LIPASE, AMYLASE in the last 168 hours. No results for input(s): AMMONIA in the last 168 hours. CBC: Recent Labs  Lab 11/13/20 2105  11/13/20 2359 11/14/20 0412  WBC 4.6 4.5 2.8*  NEUTROABS 3.5  --  2.2  HGB 10.9* 9.9* 10.6*  HCT 31.9* 29.6* 31.8*  MCV 93.8 95.5 95.5  PLT 180 173 185   Cardiac Enzymes: Recent Labs  Lab 11/14/20 0412  CKTOTAL 52   BNP: Invalid input(s): POCBNP CBG: Recent Labs  Lab 11/14/20 0258 11/14/20 0650  GLUCAP 186* 272*   D-Dimer Recent Labs    11/14/20 0412  DDIMER 1.44*   Hgb A1c Recent Labs    11/13/20 2359  HGBA1C 6.2*   Lipid Profile No results for input(s): CHOL, HDL, LDLCALC, TRIG, CHOLHDL, LDLDIRECT in the last 72 hours. Thyroid function studies Recent Labs    11/13/20 2359  TSH 0.901   Anemia work up Recent Labs    11/13/20 2105 11/13/20 2359 11/14/20 0412  FOLATE  --  12.2  --   FERRITIN  --  33 38  TIBC  --  323  --   IRON  --  51  --   RETICCTPCT 1.4  --   --    Urinalysis    Component Value Date/Time   COLORURINE YELLOW (A) 11/13/2020 2044   APPEARANCEUR CLEAR (A) 11/13/2020 2044   LABSPEC 1.024 11/13/2020 2044   PHURINE 6.0 11/13/2020 2044   GLUCOSEU 50 (A) 11/13/2020 2044   HGBUR NEGATIVE 11/13/2020 2044   HGBUR small 09/19/2007 1215   BILIRUBINUR NEGATIVE 11/13/2020 2044   BILIRUBINUR Negative 02/04/2019 1452   KETONESUR 5 (A) 11/13/2020 2044   PROTEINUR 30 (A) 11/13/2020 2044   UROBILINOGEN 0.2 02/04/2019 1452   UROBILINOGEN 0.2 09/19/2007 1215   NITRITE NEGATIVE 11/13/2020 2044   LEUKOCYTESUR NEGATIVE 11/13/2020 2044   Sepsis Labs Invalid input(s): PROCALCITONIN,  WBC,  LACTICIDVEN Microbiology Recent Results (from the past 240 hour(s))  Resp Panel by RT-PCR (Flu A&B, Covid)     Status: Abnormal   Collection Time: 11/13/20  8:44 PM   Specimen: Nasopharyngeal(NP) swabs in vial transport medium  Result Value Ref Range Status   SARS Coronavirus 2 by RT PCR POSITIVE (A) NEGATIVE Final    Comment: RESULT CALLED TO, READ BACK BY AND VERIFIED WITH: REID RENO RN 11/13/2020 2236 JG (NOTE) SARS-CoV-2 target nucleic acids are  DETECTED.  The SARS-CoV-2 RNA is generally detectable in upper respiratory specimens during  the acute phase of infection. Positive results are indicative of the presence of the identified virus, but do not rule out bacterial infection or co-infection with other pathogens not detected by the test. Clinical correlation with patient history and other diagnostic information is necessary to determine patient infection status. The expected result is Negative.  Fact Sheet for Patients: EntrepreneurPulse.com.au  Fact Sheet for Healthcare Providers: IncredibleEmployment.be  This test is not yet approved or cleared by the [place] FDA and  has been authorized for detection and/or diagnosis of SARS-CoV-2 by FDA under an Emergency Use Authorization (EUA).  This EUA will remain in effect (meaning this test can be u sed) for the duration of  the COVID-19 declaration under Section 564(b)(1) of the Act, 21 U.S.C. section 360bbb-3(b)(1), unless the authorization is terminated or revoked sooner.     Influenza A by PCR NEGATIVE NEGATIVE Final   Influenza B by PCR NEGATIVE NEGATIVE Final    Comment: (NOTE) The Xpert Xpress SARS-CoV-2/FLU/RSV plus assay is intended as an aid in the diagnosis of influenza from Nasopharyngeal swab specimens and should not be used as a sole basis for treatment. Nasal washings and aspirates are unacceptable for Xpert Xpress SARS-CoV-2/FLU/RSV testing.  Fact Sheet for Patients: EntrepreneurPulse.com.au  Fact Sheet for Healthcare Providers: IncredibleEmployment.be  This test is not yet approved or cleared by the [place] FDA and has been authorized for detection and/or diagnosis of SARS-CoV-2 by FDA under an Emergency Use Authorization (EUA). This EUA will remain in effect (meaning this test can be used) for the duration of the COVID-19 declaration under Section 564(b)(1) of the Act, 21  U.S.C. section 360bbb-3(b)(1), unless the authorization is terminated or revoked.  Performed at [facility], [address] [place], [place] [address]      Total time spend on discharging this patient, including the last patient exam, discussing the hospital stay, instructions for ongoing care as it relates to all pertinent caregivers, as well as preparing the medical discharge records, prescriptions, and/or referrals as applicable, is 60 minutes.    Enzo Bi, MD  [facility] 11/14/2020, 11:11 AM

## 2020-11-14 NOTE — Evaluation (Addendum)
Physical Therapy Evaluation Patient Details Name: Diana Roberson MRN: [id number] DOB: 1946/03/12 Today's Date: 11/14/2020   History of Present Illness  DAJA SHUPING is a 75 y.o. female brought to the emergency room for not acting right all day by spouse.  On arrival patient met Code sepsis / Sepsis protocol initiated. Also has been weak and decreased po intake. Pt on exam is lethargic and somnolent difficult to arouse  and minimally answers questions but falls back asleep.  Patient has medical history significant of diabetes mellitus type 2, hypertension, rheumatoid arthritis, glaucoma, hyperlipidemia, overactive bladder, GERD, carotid bruit. Patient found to have COVID 19.    Clinical Impression  Received patient sleeping in room. Telemetry noted apnea. Patient lethargic and speaks short phrases at times. Oriented only to her own name. Attempts to carry out most commands but cannot follow through due to lethargy. Unable to provide reliable history and family not present. Patient states she has started walking outside with a walker recently but is unable to say how far. Prior documentation notes patient lives at home with her husband. Upon PT evaluation, patient requires max A for supine <> sit bed mobility and total A for rolling. She is unable to sit on edge of bed without periodic physical assistance to prevent her from toppling forwards. Patient able to perform sit <> stand to RW with lack of hip extension, resulting in patient stooped forward ~ 90 degrees over the walker. Requires mod A and shuffling gait to transfer bed to chair and required PT to move buttocks over from chair to bed in a pivot to return to bed. HR climbed to 126 bpm during transfers but decreased to low 100s with rest. Patient attempted ambulation ~ 2 feet with trunk stooped over RW and shuffling gait pattern with min A to initiate movement and prevent falls. Very close chair follow provided. Discontinued ambulation and returned  patient to bed when her HR went up to 133 bpm on telemetry with ambulation attempt. Patient was incontinent of urine during mobility and was assisted in changing wet clothing and bedclothes by PT and NT. HR continued to be in low 100s at resting, SpO2 remained in 90s throughout session. NT measured blood glucose at end of session and found it is above 300 mg/dl which may have contributed to patient's mental status. Patient does not appear functionally safe to return home at this time and requires physical assistance and care that is not reasonable to expect from family members. She requires SNF level care and would benefit from short term rehab at a SNF prior to returning home. Patient would benefit from skilled physical therapy to address impairments and functional limitations (see PT Problem List below) to work towards stated goals and return to PLOF or maximal functional independence.      Follow Up Recommendations SNF    Equipment Recommendations  Rolling walker with 5" wheels;3in1 (PT) (unless patient's family already have these items)    Recommendations for Other Services       Precautions / Restrictions Precautions Precautions: Fall      Mobility  Bed Mobility Overal bed mobility: Needs Assistance Bed Mobility: Rolling;Sit to Supine;Supine to Sit Rolling: Max assist;Total assist   Supine to sit: Max assist Sit to supine: Max assist   General bed mobility comments: Patient able to get feet off the bed and reach out with left UE to pull on PT in attempt to perform supine to sit but ultimately required max A from PT to  get to sitting. Also needed Max A for rolling and sit to supine later in session.    Transfers Overall transfer level: Needs assistance Equipment used: Rolling walker (2 wheeled) Transfers: Sit to/from [facility] Sit to Stand: Mod assist Stand pivot transfers: Mod assist       General transfer comment: Patient attempts sit <> stand to RW but  fails to extend hips, maintaining trunk bent over RW. Takes a few shuffling steps this way bed to chair with PT supporting balance. Patient became more fatigued during session and required mod A to move buttocks from chair to bed without moving feet while bent over RW. HR climbs up to 126 bpm with transfers. SpO2 WNL.  Ambulation/Gait Ambulation/Gait assistance: Min assist Gait Distance (Feet): 2 Feet Assistive device: Rolling walker (2 wheeled) Gait Pattern/deviations: Trunk flexed;Shuffle Gait velocity: extremely slow   General Gait Details: Patient attempted ambulation forward with RW with very close chair follow. Took some shuffling steps with trunk flexed over RW. Very confused and lethargic but attempting to follow commands. Discontinued due to HR up to 133 bpm. SpO2 remained WFL.  Stairs            Wheelchair Mobility    Modified Rankin (Stroke Patients Only)       Balance Overall balance assessment: Needs assistance   Sitting balance-Leahy Scale: Poor Sitting balance - Comments: Patient unable to maintain sitting balance without slumping forward. Postural control:  (forward lean) Standing balance support: During functional activity;Bilateral upper extremity supported Standing balance-Leahy Scale: Poor Standing balance comment: Patient requires B UE support on RW and PT physical assistance to maintain standing while mobilizing.                             Pertinent Vitals/Pain Pain Assessment: Faces Faces Pain Scale: No hurt    Home Living                        Prior Function           Comments: Patient lethargic and oriented only to name. Unable to provide history at this time. Per prior documentation, patient lives at home with her husband. Patien states she was starting to walk outside with a walker recently. Unable to state how far.     Hand Dominance        Extremity/Trunk Assessment   Upper Extremity Assessment Upper  Extremity Assessment: Difficult to assess due to impaired cognition (Patient unable to push/pull trunk with upper body strength. Deformity consistent with RA noted in hands and cool to touch.)    Lower Extremity Assessment Lower Extremity Assessment: Difficult to assess due to impaired cognition (Able to come to stand from chair with knees mostly extended, but fails to extend hips resulting in resting trunk on RW. Deformity consistent with RA noted in toes, cool to touch, covered in bandages for protection.)    Cervical / Trunk Assessment Cervical / Trunk Assessment: Kyphotic  Communication   Communication: Expressive difficulties (too drowsy to finish phrases/sentences)  Cognition Arousal/Alertness: Lethargic Behavior During Therapy: Flat affect Overall Cognitive Status: No family/caregiver present to determine baseline cognitive functioning                                 General Comments: Attempts to follow commands but too drowsy to follow through  General Comments General comments (skin integrity, edema, etc.): Patient incontinent of urine during mobility attempt.    Exercises Other Exercises Other Exercises: Assisted patient and NT with changing gown, replacing mesh briefs, replacing wet chucks after she was incontient of urine.   Assessment/Plan    PT Assessment Patient needs continued PT services  PT Problem List Decreased strength;Decreased coordination;Cardiopulmonary status limiting activity;Decreased range of motion;Decreased cognition;Decreased activity tolerance;Decreased knowledge of use of DME;Decreased balance;Decreased safety awareness;Decreased mobility;Decreased knowledge of precautions;Decreased skin integrity       PT Treatment Interventions DME instruction;Balance training;Gait training;Neuromuscular re-education;Stair training;Cognitive remediation;Functional mobility training;Patient/family education;Therapeutic activities;Therapeutic  exercise    PT Goals (Current goals can be found in the Care Plan section)  Acute Rehab PT Goals PT Goal Formulation: Patient unable to participate in goal setting    Frequency Min 2X/week   Barriers to discharge Other (comment) Currently requires heavy physical assistance for mobility, AMS    Co-evaluation               AM-PAC PT "6 Clicks" Mobility  Outcome Measure Help needed turning from your back to your side while in a flat bed without using bedrails?: Total Help needed moving from lying on your back to sitting on the side of a flat bed without using bedrails?: Total Help needed moving to and from a bed to a chair (including a wheelchair)?: A Lot Help needed standing up from a chair using your arms (e.g., wheelchair or bedside chair)?: A Lot Help needed to walk in hospital room?: Total Help needed climbing 3-5 steps with a railing? : Total 6 Click Score: 8    End of Session Equipment Utilized During Treatment: Gait belt Activity Tolerance: Patient limited by lethargy Patient left: in bed;with bed alarm set;with call bell/phone within reach   PT Visit Diagnosis: Unsteadiness on feet (R26.81);Difficulty in walking, not elsewhere classified (R26.2);Muscle weakness (generalized) (M62.81);Other symptoms and signs involving the nervous system (R29.898)    Time: 1230-1300 PT Time Calculation (min) (ACUTE ONLY): 30 min   Charges:   PT Evaluation $PT Eval Moderate Complexity: 1 Mod PT Treatments $Therapeutic Activity: 8-22 mins        Everlean Alstrom. Graylon Good, PT, DPT 11/14/20, 1:33 PM

## 2020-11-14 NOTE — Discharge Instructions (Signed)

## 2020-11-14 NOTE — TOC Progression Note (Signed)
Transition of Care [facility]) - Progression Note    Patient Details  Name: Diana Roberson MRN: [id number] Date of Birth: 1945-11-28  [facility]) CM/SW Contact  Bing Quarry, RN Phone Number: 11/14/2020, 11:55 AM  Clinical Narrative:   Patient is being discharged to Home, Code-44 ordered and initiated. Spoke with daughter and spouse together over the phone as patient phone is restricted. Explained Code-44 and printed a copy to unit for the patient care RN to deliver to patient's room. Emphasized phone numbers to call regarding questions about change in billing status. Gabriel Cirri RN CM         Expected Discharge Plan and Services           Expected Discharge Date: 11/14/20                                     Social Determinants of Health (SDOH) Interventions    Readmission Risk Interventions No flowsheet data found.

## 2020-11-14 NOTE — Care Management CC44 (Signed)
Condition Code 44 Documentation Completed  Patient Details  Name: Diana Roberson MRN: [id number] Date of Birth: Jan 19, 1946   Condition Code 44 given:  Yes Patient signature on Condition Code 44 notice:  Yes Documentation of 2 MD's agreement:  Yes Code 44 added to claim:  Yes  Explained to daughter Arminda Resides after explaining to her the form, due to patient's phone being restricted. Spouse notified as well. Gabriel Cirri RN CM  Bing Quarry, RN 11/14/2020, 11:48 AM

## 2020-11-14 NOTE — Progress Notes (Signed)
[facility] aware of +++ Covid

## 2020-11-15 LAB — URINE CULTURE: Culture: NO GROWTH

## 2020-11-18 LAB — CULTURE, BLOOD (ROUTINE X 2)
Culture: NO GROWTH
Culture: NO GROWTH
Special Requests: ADEQUATE
Special Requests: ADEQUATE

## 2020-12-01 DIAGNOSIS — R509 Fever, unspecified: Secondary | ICD-10-CM | POA: Diagnosis not present

## 2020-12-01 DIAGNOSIS — R059 Cough, unspecified: Secondary | ICD-10-CM | POA: Diagnosis not present

## 2020-12-03 ENCOUNTER — Telehealth: Payer: Self-pay

## 2020-12-03 DIAGNOSIS — F3174 Bipolar disorder, in full remission, most recent episode manic: Secondary | ICD-10-CM

## 2020-12-03 MED ORDER — QUETIAPINE FUMARATE 25 MG PO TABS: 12.5000 mg | ORAL_TABLET | Freq: Every day | ORAL | 3 refills | Status: DC

## 2020-12-03 NOTE — Telephone Encounter (Signed)
Phone call with husband  I am sorry that there was miscommunication--but I definitely told them to stop the cyclobenzaprine. They stopped the seroquel completely---she had tremors, couldn't walk (trouble even getting to the bathroom), not responding to people even talking to her They restarted 1/2 or 1/4 since then---and she was nearly bad to normal yesterday. Worse today---he thinks she got too much (1/2 instead of a quarter).  He is now asking for psychiatrist to help with her care--- will make referral Will send 25mg  seroquel--and they will give 1/2 of that

## 2020-12-03 NOTE — Telephone Encounter (Signed)
[name] [facility] signed) and [name] pts daughter (not on [place]) are on speaker phone. Kim said at the 11/08/20 visit pt was advised to stop Seroquel without pt beng advised to wien off med. I advised [name] and [name] that per 11/08/20 note Dr Alphonsus Sias had to stop Flexeril. [name] said Dr Alphonsus Sias said to stop Seroquel.Kim said Dr Alphonsus Sias either said Seroquel instead of flexeril or Dr Alphonsus Sias put Flexeril in chart by error. [name] said that pt stopped seroquel on 11/09/20 and started having bad withdrawal symptoms such as tremors in hands and pt could almost not function; pt had to have assistance in walking to bathroom and pt was almost in catatonic state.on 11/29/20 pt's family restarted the seroquel 50 mg taking1/2 tab again and then decreased to 1/4 tab which pt seems to do better on. [name] also request Seroquel 25 mg taking 1/2 tab sent to [facility] due to difficult to cut Seroquel 50 mg into quarters.Kim wants to know if pt should be taking seroquel and Geodon together.[name] and [name] also want to know approx 15 years ago when pts psychiatrist stopped practicing why did Dr Alphonsus Sias not refer pt to another psychiatrist. When pt is off seroquel if affects pts ability to walk;walking not problem with pts legs but pts brain. Requesting referral for psychiatrist also.  Kim request cb after Dr Alphonsus Sias reviews this note today.

## 2020-12-07 ENCOUNTER — Telehealth: Payer: Self-pay

## 2020-12-07 NOTE — Chronic Care Management (AMB) (Addendum)
Chronic Care Management Pharmacy Assistant   Name: Diana Roberson  MRN: [id number] DOB: [birth year]  Reason for Encounter: Disease State- Diabetes and Hypertension   Conditions to be addressed/monitored: HTN and DMII  Recent office visits:  12/01/20- Dr. Glenna Fellows  Recent consult visits:  No recent consults  Hospital visits:  Medication Reconciliation was completed by comparing discharge summary, patient's EMR and Pharmacy list. Admitted to the hospital on 11/13/20 due to Covid-19, Altered Mental Status. Discharge date was 11/14/20. Discharged from [facility].    New?Medications Started at [facility] Discharge:?? -No new medications identified  Medication Changes at Hospital Discharge: -Lisinopril held but resumed at discharged  Medications Discontinued at Hospital Discharge: -no medications discontinued  Medications that remain the same after Hospital Discharge:??  -All other medications will remain the same.    Medications: Outpatient Encounter Medications as of 12/07/2020  Medication Sig   aspirin EC 81 MG tablet Take 81 mg by mouth daily.   atorvastatin (LIPITOR) 20 MG tablet TAKE 1 TABLET BY MOUTH  DAILY   brimonidine (ALPHAGAN) 0.2 % ophthalmic solution Place 1 drop into both eyes 2 (two) times daily.   calcium gluconate 500 MG tablet Take 1 tablet by mouth 2 (two) times daily.   cholecalciferol (VITAMIN D) 1000 UNITS tablet Take 1,000 Units by mouth daily.   dorzolamide-timolol (COSOPT) 22.3-6.8 MG/ML ophthalmic solution Place 1 drop into both eyes 2 (two) times daily.   famotidine (PEPCID) 20 MG tablet Take 1 tablet (20 mg total) by mouth daily. In am   folic acid (FOLVITE) 1 MG tablet Take 1 tablet (1 mg total) by mouth daily.   gabapentin (NEURONTIN) 300 MG capsule Take 300 mg by mouth at bedtime.   glucose blood (ONETOUCH VERIO) test strip Use to check blood sugar once a day. Dx Code E11.9   hydroxychloroquine (PLAQUENIL) 200 MG tablet Take 200 mg by mouth  daily.   lisinopril (ZESTRIL) 10 MG tablet Take 1 tablet (10 mg total) by mouth daily.   metFORMIN (GLUCOPHAGE) 500 MG tablet Take 2 tablets (1,000 mg total) by mouth 2 (two) times daily with a meal.   methotrexate (RHEUMATREX) 2.5 MG tablet Take 7 tablets (17.5 mg total) by mouth once a week. Caution:Chemotherapy. Protect from light.   mirabegron ER (MYRBETRIQ) 25 MG TB24 tablet Take 1 tablet (25 mg total) by mouth daily.   OneTouch Delica Lancets 33G MISC 1 each by Does not apply route daily. Dx Code E11.9   QUEtiapine (SEROQUEL) 25 MG tablet Take 0.5-1 tablets (12.5-25 mg total) by mouth at bedtime.   vitamin B-12 (CYANOCOBALAMIN) 1000 MCG tablet Take 1,000 mcg by mouth daily.   vitamin E 400 UNIT capsule Take 400 Units by mouth daily.   ziprasidone (GEODON) 80 MG capsule TAKE 1 TO 2 CAPSULES BY  MOUTH DAILY   No facility-administered encounter medications on file as of 12/07/2020.     Recent Relevant Labs: Lab Results  Component Value Date/Time   HGBA1C 6.2 (H) 11/13/2020 11:59 PM   HGBA1C 7.7 (A) 06/16/2020 11:13 AM   HGBA1C 6.9 (A) 01/15/2020 02:35 PM   HGBA1C 7.2 (H) 01/14/2019 02:26 PM   MICROALBUR 1.7 04/30/2015 12:43 PM   MICROALBUR 2.1 (H) 06/09/2013 12:54 PM    Kidney Function Lab Results  Component Value Date/Time   CREATININE 0.72 11/14/2020 04:12 AM   CREATININE 0.78 11/13/2020 11:59 PM   GFR 67.03 07/19/2020 04:38 PM   GFRNONAA >60 11/14/2020 04:12 AM   GFRAA 77 01/17/2016 03:45  PM   Multiple attempts made to reach patient on 12/07/20, 12/08/20 and 12/09/20 to discuss diabetes and hypertension. Unsuccessful outreach.   Current antihyperglycemic regimen:  Metformin 500 mg - 2 tablets BID with meals  What recent interventions/DTPs have been made to improve glycemic control:  Discontinued glimepiride 2 mg due to fasting blood sugars in the 90's   Have there been any recent hospitalizations or ED visits since last visit with CPP? Yes  Adherence Review: Is the  patient currently on a STATIN medication? Yes Is the patient currently on ACE/ARB medication? Yes Does the patient have >5 day gap between last estimated fill dates? CPP to review   Recent Office Vitals: BP Readings from Last 3 Encounters:  11/14/20 133/63  11/08/20 118/84  08/23/20 (!) 148/67   Pulse Readings from Last 3 Encounters:  11/14/20 (!) 108  11/08/20 87  08/23/20 94    Wt Readings from Last 3 Encounters:  11/14/20 133 lb 13.1 oz (60.7 kg)  11/08/20 134 lb (60.8 kg)  08/22/20 130 lb (59 kg)     Kidney Function Lab Results  Component Value Date/Time   CREATININE 0.72 11/14/2020 04:12 AM   CREATININE 0.78 11/13/2020 11:59 PM   GFR 67.03 07/19/2020 04:38 PM   GFRNONAA >60 11/14/2020 04:12 AM   GFRAA 77 01/17/2016 03:45 PM    BMP Latest Ref Rng & Units 11/14/2020 11/13/2020 11/13/2020  Glucose 70 - 99 mg/dL 706(C) - 376(E)  BUN 8 - 23 mg/dL 14 - 23  Creatinine 8.31 - 1.00 mg/dL 5.17 6.16 0.73  BUN/Creat Ratio 12 - 28 - - -  Sodium 135 - 145 mmol/L 137 - 137  Potassium 3.5 - 5.1 mmol/L 4.0 - 3.8  Chloride 98 - 111 mmol/L 108 - 105  CO2 22 - 32 mmol/L 22 - 20(L)  Calcium 8.9 - 10.3 mg/dL 7.1(G) - 8.9    Current antihypertensive regimen:  Lisinopril 10 mg - 1 tablet daily   What recent interventions/DTPs have been made by any provider to improve Blood Pressure control since last CPP Visit:  No recent interventions noted in chart  Any recent hospitalizations or ED visits since last visit with CPP? Yes  Adherence Review: Is the patient currently on ACE/ARB medication? Yes Does the patient have >5 day gap between last estimated fill dates? CPP to review   Star Rating Drugs:  Medication:  Last Fill: Day Supply Atorvastatin 20 mg 11/16/20  90 Lisinopril 10 mg 11/08/20 90 Metformin 500 mg 09/29/20 90  Follow-Up:  Pharmacist Review  Phil Dopp, CPP notified  Jomarie Longs, [facility] Clinical Pharmacy Assistant [phone]  I have reviewed the care  management and care coordination activities outlined in this encounter and I am certifying that I agree with the content of this note. No further action required.  Phil Dopp, PharmD Clinical Pharmacist [facility] [phone]

## 2020-12-14 ENCOUNTER — Telehealth: Payer: Self-pay | Admitting: Internal Medicine

## 2020-12-14 DIAGNOSIS — F319 Bipolar disorder, unspecified: Secondary | ICD-10-CM

## 2020-12-14 NOTE — Telephone Encounter (Signed)
Spoke to [employer], again. She said she has read up on the medication and knows this is the Geodon causing her issues of balance being off, shuffling her feet. Daughter wants her off of it. She needs her to be able to walk to take care of her. Asking if a referral for a psychiatrist could be done urgently or does she need to try and find her one? She has an appt her 4-13. She is sleeping okay and walking better on the every other day dosing right now. She will make some calls this afternoon to see if she can find one before we do.

## 2020-12-14 NOTE — Telephone Encounter (Signed)
Spoke to pt's daughter to clarify the request. She is asking about the referral and how to wean her off. Diana Roberson has decreased her to every other night 80mg . Asking about getting 60mg .

## 2020-12-14 NOTE — Telephone Encounter (Signed)
I really don't recommend weaning this now---she is under terrible stress and I am concerned about triggering a manic attack. I would prefer to discuss this in person (since she had not told me of troubles with the medicine before) I am okay with her setting up with a psychiatrist again--but I don't recommend dose reduction till then

## 2020-12-14 NOTE — Telephone Encounter (Signed)
Daughter kim called and stated that they have been waiting on a phone call for a therapist, due to her mom is getting worse and she is having bad side affects Geodon and wanted to know about weaning her off or changing the dosage of the medicine due to she has been on it for so long.

## 2020-12-15 NOTE — Telephone Encounter (Signed)
Called Lea at [facility] psychiatric office to follow up. Referral was sent to them on 12/03/20, per Lea she will follow up with the referral coordinator to see if NP paperwork was sent out to the patient. I advised Lea that no one is at the home address right now so paperwork would not get filled out. Lea will ask referral coordinator to call [name] and advised her of everything and how to get the paperwork done.

## 2020-12-15 NOTE — Telephone Encounter (Signed)
Spoke to daughter, [name]. Due to current situation with pt and husband, she is asking that the referral coordinator or the psychiatric office contact her to set up appts.

## 2020-12-15 NOTE — Telephone Encounter (Signed)
Let her know that I have put in for an urgent referral If her appt is with a psychologist, she should keep that (but they wouldn't be able to do medication management)

## 2020-12-16 ENCOUNTER — Ambulatory Visit: Payer: Medicare Other | Admitting: Internal Medicine

## 2020-12-22 ENCOUNTER — Ambulatory Visit: Payer: Medicare Other | Admitting: Internal Medicine

## 2020-12-23 ENCOUNTER — Telehealth: Payer: Self-pay

## 2020-12-23 MED ORDER — ZIPRASIDONE HCL 60 MG PO CAPS
60.0000 mg | ORAL_CAPSULE | Freq: Every day | ORAL | 3 refills | Status: DC
Start: 2020-12-23 — End: 2020-12-26

## 2020-12-23 NOTE — Telephone Encounter (Signed)
Spoke to [employer]. I have sent in the 60mg .  Will send this message to Diana Roberson and Diana Roberson to see what happened with the 2 referrals that were placed. She is hoping to be seen sooner than 2 weeks.

## 2020-12-23 NOTE — Addendum Note (Signed)
Addended by: Eual Fines on: 12/23/2020 10:01 AM   Modules accepted: Orders

## 2020-12-23 NOTE — Telephone Encounter (Signed)
Needing a refill of Geodon to [facility]. Currently she is on 80mg  every other night. Asking if Dr will change it to 40mg  or 60mg  to take every night. Hoping that this would help. The days when she takes it the night before she doesn't function well: gait is off, speech is slurred, poor enunciation. The days without it the night before are slightly better. Having more episodes of confusion.   She also was asking about the referral to psychiatry. No one has contacted her. She did have an assessment with one yesterday. In [place] is ok, but she lives in [place]. Also needs to be in-network with [facility].   Looking at placing her in a facility. Wants to make sure Dr will do a FL2 form.   Hoping that maybe she is struggling with the passing of her husband and will get better mentally, cognitively.

## 2020-12-23 NOTE — Telephone Encounter (Signed)
Called [facility] office to follow up and spoke with [facility]. She will call [name] today to follow up on this.

## 2020-12-23 NOTE — Addendum Note (Signed)
Addended by: Eual Fines on: 12/23/2020 01:17 PM   Modules accepted: Orders

## 2020-12-23 NOTE — Telephone Encounter (Signed)
I was not in favor of weaning the geodon without psychiatry help. I would prefer 60 mg daily to the 80mg  every other day---if she wants to do that. Check with Asht[facility] about the referral  Yes, I will do a FL-2

## 2020-12-23 NOTE — Progress Notes (Deleted)
Psychiatric Initial Adult Assessment   Patient Identification: Diana Roberson MRN:  [id number] Date of Evaluation:  12/23/2020 Referral Source: *** Chief Complaint:   Visit Diagnosis: No diagnosis found.  History of Present Illness:   Diana Roberson is a 75 y.o. year old female with a history of bipolar I disorder, type II diabetes,  RA, on methotrexate, hypertension, rheumatoid arthritis, glaucoma, hyperlipidemia, overactive bladder, GERD, carotid bruit, who is referred for bipolar disorder.   Per chart review, "Ataxia. May be multifactorial. Is diabetic. Cannot rule out early Parkinson's Question mild dementia" documented on Rheumatology visit in 09/2020.  Per chart review, patient presented to ED yesterday for  increased anxiety, restlessness, and possible confusion over the last few weeks. The patient returned home before psychiatry evaluated the patient.   akathesia  Daily routine: Exercise: Employment: retired Support: Household:  Marital status: widow Number of children: 2   Associated Signs/Symptoms: Depression Symptoms:  {DEPRESSION SYMPTOMS:20000} (Hypo) Manic Symptoms:  {BHH MANIC SYMPTOMS:22872} Anxiety Symptoms:  {BHH ANXIETY SYMPTOMS:22873} Psychotic Symptoms:  {BHH PSYCHOTIC SYMPTOMS:22874} PTSD Symptoms: {BHH PTSD SYMPTOMS:22875}  Past Psychiatric History:  Outpatient: many years in the past Psychiatry admission:  In 81s for mania Previous suicide attempt:  Past trials of medication: depakote, quetiapine History of violence:   Previous Psychotropic Medications: {YES/NO:21197}  Substance Abuse History in the last 12 months:  {yes no:314532}  Consequences of Substance Abuse: {BHH CONSEQUENCES OF SUBSTANCE ABUSE:22880}  Past Medical History:  Past Medical History:  Diagnosis Date  . Allergy   . Bipolar 1 disorder (HCC)   . Chronic back pain   . Diabetes mellitus   . Difficult intubation   . Diverticulitis   . GERD (gastroesophageal reflux  disease)   . Glaucoma   . Hyperlipidemia   . Hypertension   . Mental health disorder    admitted x3  . Osteopenia   . Rheumatoid arthritis [facility])     Past Surgical History:  Procedure Laterality Date  . APPENDECTOMY    . BACK SURGERY  1988  . BREAST CYST ASPIRATION     neg  . CHOLECYSTECTOMY    . COLONOSCOPY    . COLONOSCOPY WITH PROPOFOL N/A 11/27/2017   Procedure: COLONOSCOPY WITH PROPOFOL;  Surgeon: Christena Deem, MD;  Location: [facility] ENDOSCOPY;  Service: Endoscopy;  Laterality: N/A;  . HAND SURGERY    . ORIF ELBOW FRACTURE Right 06/24/2019   Procedure: OPEN REDUCTION INTERNAL FIXATION (ORIF) ELBOW/OLECRANON FRACTURE;  Surgeon: Christena Flake, MD;  Location: [facility] ORS;  Service: Orthopedics;  Laterality: Right;    Family Psychiatric History: ***  Family History:  Family History  Problem Relation Age of Onset  . Diabetes Mother   . Hypertension Mother   . Stroke Sister   . Hashimoto's thyroiditis Daughter   . Stroke Father   . Stroke Sister   . Cancer Neg Hx   . Breast cancer Neg Hx     Social History:   Social History   Socioeconomic History  . Marital status: Married    Spouse name: Not on file  . Number of children: 2  . Years of education: Not on file  . Highest education level: Not on file  Occupational History  . Occupation: formerly Designer, jewellery for [employer]  . Smoking status: Never Smoker  . Smokeless tobacco: Never Used  Vaping Use  . Vaping Use: Never used  Substance and Sexual Activity  . Alcohol use: No  . Drug use: No  .  Sexual activity: Not on file  Other Topics Concern  . Not on file  Social History Narrative   Has living will   Husband is health care POA. Alternate is daughter [name]   Would accept resuscitation attempts   Would not want feeding tube if cognitively unaware   Social Determinants of Health   Financial Resource Strain: Low Risk   . Difficulty of Paying Living Expenses: Not very hard   Food Insecurity: Not on file  Transportation Needs: Not on file  Physical Activity: Not on file  Stress: Not on file  Social Connections: Not on file    Additional Social History: ***  Allergies:   Allergies  Allergen Reactions  . Codeine Sulfate Other (See Comments)    Indigestion and epigastric pain  . Sertraline Hcl Other (See Comments)    REACTION: Worse, ? irritable    Metabolic Disorder Labs: Lab Results  Component Value Date   HGBA1C 6.2 (H) 11/13/2020   MPG 131.24 11/13/2020   No results found for: PROLACTIN Lab Results  Component Value Date   CHOL 112 07/19/2020   TRIG 85.0 07/19/2020   HDL 53.00 07/19/2020   CHOLHDL 2 07/19/2020   VLDL 17.0 07/19/2020   LDLCALC 42 07/19/2020   LDLCALC 49 01/14/2019   Lab Results  Component Value Date   TSH 0.901 11/13/2020    Therapeutic Level Labs: No results found for: LITHIUM No results found for: CBMZ No results found for: VALPROATE  Current Medications: Current Outpatient Medications  Medication Sig Dispense Refill  . aspirin EC 81 MG tablet Take 81 mg by mouth daily.    Marland Kitchen atorvastatin (LIPITOR) 20 MG tablet TAKE 1 TABLET BY MOUTH  DAILY 90 tablet 3  . brimonidine (ALPHAGAN) 0.2 % ophthalmic solution Place 1 drop into both eyes 2 (two) times daily.    . calcium gluconate 500 MG tablet Take 1 tablet by mouth 2 (two) times daily.    . cholecalciferol (VITAMIN D) 1000 UNITS tablet Take 1,000 Units by mouth daily.    . dorzolamide-timolol (COSOPT) 22.3-6.8 MG/ML ophthalmic solution Place 1 drop into both eyes 2 (two) times daily.    . famotidine (PEPCID) 20 MG tablet Take 1 tablet (20 mg total) by mouth daily. In am 90 tablet 3  . folic acid (FOLVITE) 1 MG tablet Take 1 tablet (1 mg total) by mouth daily. 90 tablet 3  . gabapentin (NEURONTIN) 300 MG capsule Take 300 mg by mouth at bedtime.    Marland Kitchen glucose blood (ONETOUCH VERIO) test strip Use to check blood sugar once a day. Dx Code E11.9 100 each 3  .  hydroxychloroquine (PLAQUENIL) 200 MG tablet Take 200 mg by mouth daily.    Marland Kitchen lisinopril (ZESTRIL) 10 MG tablet Take 1 tablet (10 mg total) by mouth daily. 90 tablet 3  . metFORMIN (GLUCOPHAGE) 500 MG tablet Take 2 tablets (1,000 mg total) by mouth 2 (two) times daily with a meal. 360 tablet 3  . methotrexate (RHEUMATREX) 2.5 MG tablet Take 7 tablets (17.5 mg total) by mouth once a week. Caution:Chemotherapy. Protect from light. 91 tablet 3  . mirabegron ER (MYRBETRIQ) 25 MG TB24 tablet Take 1 tablet (25 mg total) by mouth daily. 90 tablet 3  . OneTouch Delica Lancets 33G MISC 1 each by Does not apply route daily. Dx Code E11.9 100 each 3  . QUEtiapine (SEROQUEL) 25 MG tablet Take 0.5-1 tablets (12.5-25 mg total) by mouth at bedtime. 30 tablet 3  . vitamin B-12 (CYANOCOBALAMIN)  1000 MCG tablet Take 1,000 mcg by mouth daily.    . vitamin E 400 UNIT capsule Take 400 Units by mouth daily.    . ziprasidone (GEODON) 60 MG capsule Take 1 capsule (60 mg total) by mouth at bedtime. 90 capsule 3   No current facility-administered medications for this visit.    Musculoskeletal: Strength & Muscle Tone: N/A Gait & Station: N/A Patient leans: N/A  Psychiatric Specialty Exam: Review of Systems  There were no vitals taken for this visit.There is no height or weight on file to calculate BMI.  General Appearance: {Appearance:22683}  Eye Contact:  {BHH EYE CONTACT:22684}  Speech:  Clear and Coherent  Volume:  Normal  Mood:  {BHH MOOD:22306}  Affect:  {Affect (PAA):22687}  Thought Process:  Coherent  Orientation:  Full (Time, Place, and Person)  Thought Content:  Logical  Suicidal Thoughts:  {ST/HT (PAA):22692}  Homicidal Thoughts:  {ST/HT (PAA):22692}  Memory:  Immediate;   Good  Judgement:  {Judgement (PAA):22694}  Insight:  {Insight (PAA):22695}  Psychomotor Activity:  Normal  Concentration:  Concentration: Good and Attention Span: Good  Recall:  Good  Fund of Knowledge:Good  Language: Good   Akathisia:  No  Handed:  Right  AIMS (if indicated):  not done  Assets:  Communication Skills Desire for Improvement  ADL's:  Intact  Cognition: WNL  Sleep:  {BHH GOOD/FAIR/POOR:22877}   Screenings: Secondary school teacher Row Office Visit from 07/19/2020 in [facility] Visit from 07/17/2019 in [facility] Visit from 11/01/2017 in [facility] Visit from 05/01/2016 in [facility] Visit from 12/20/2015 in [place]  PHQ-2 Total Score 0 0 0 0 0    Flowsheet Row ED to Hosp-Admission (Discharged) from 11/13/2020 in [facility]  C-SSRS RISK CATEGORY No Risk      Assessment and Plan:  Assessment  Plan   The patient demonstrates the following risk factors for suicide: Chronic risk factors for suicide include: {Chronic Risk Factors for CBJSEGB:15176160}. Acute risk factors for suicide include: {Acute Risk Factors for VPXTGGY:69485462}. Protective factors for this patient include: {Protective Factors for Suicide VOJJ:00938182}. Considering these factors, the overall suicide risk at this point appears to be {Desc; low/moderate/high:110033}. Patient {ACTION; IS/IS XHB:71696789} appropriate for outpatient follow up.   Neysa Hotter, MD 4/14/20223:02 PM

## 2020-12-23 NOTE — Telephone Encounter (Signed)
Vm from pt's daughter, Arminda Resides (not on dpr).  She states Dr. Alphonsus Sias is not refilling pt's medication even though rx company has reached out to him.  She doesn't understand what's going.  But [name] wants a call back at [phone] at earliest convenience.

## 2020-12-25 ENCOUNTER — Emergency Department: Payer: Medicare Other

## 2020-12-25 ENCOUNTER — Other Ambulatory Visit: Payer: Self-pay

## 2020-12-25 ENCOUNTER — Emergency Department
Admission: EM | Admit: 2020-12-25 | Discharge: 2020-12-26 | Disposition: A | Discharge status: Home / Self Care | Payer: Medicare Other | Attending: Emergency Medicine | Admitting: Emergency Medicine

## 2020-12-25 DIAGNOSIS — Z79899 Other long term (current) drug therapy: Secondary | ICD-10-CM | POA: Insufficient documentation

## 2020-12-25 DIAGNOSIS — Z9049 Acquired absence of other specified parts of digestive tract: Secondary | ICD-10-CM | POA: Diagnosis not present

## 2020-12-25 DIAGNOSIS — S52611A Displaced fracture of right ulna styloid process, initial encounter for closed fracture: Secondary | ICD-10-CM | POA: Diagnosis not present

## 2020-12-25 DIAGNOSIS — Z7982 Long term (current) use of aspirin: Secondary | ICD-10-CM | POA: Insufficient documentation

## 2020-12-25 DIAGNOSIS — E785 Hyperlipidemia, unspecified: Secondary | ICD-10-CM | POA: Diagnosis not present

## 2020-12-25 DIAGNOSIS — Z7984 Long term (current) use of oral hypoglycemic drugs: Secondary | ICD-10-CM | POA: Diagnosis not present

## 2020-12-25 DIAGNOSIS — E119 Type 2 diabetes mellitus without complications: Secondary | ICD-10-CM | POA: Diagnosis not present

## 2020-12-25 DIAGNOSIS — R296 Repeated falls: Secondary | ICD-10-CM | POA: Diagnosis not present

## 2020-12-25 DIAGNOSIS — Z885 Allergy status to narcotic agent status: Secondary | ICD-10-CM | POA: Diagnosis not present

## 2020-12-25 DIAGNOSIS — E1169 Type 2 diabetes mellitus with other specified complication: Secondary | ICD-10-CM | POA: Insufficient documentation

## 2020-12-25 DIAGNOSIS — H409 Unspecified glaucoma: Secondary | ICD-10-CM | POA: Diagnosis not present

## 2020-12-25 DIAGNOSIS — F3110 Bipolar disorder, current episode manic without psychotic features, unspecified: Secondary | ICD-10-CM | POA: Insufficient documentation

## 2020-12-25 DIAGNOSIS — I10 Essential (primary) hypertension: Secondary | ICD-10-CM | POA: Insufficient documentation

## 2020-12-25 DIAGNOSIS — F319 Bipolar disorder, unspecified: Secondary | ICD-10-CM

## 2020-12-25 DIAGNOSIS — F419 Anxiety disorder, unspecified: Secondary | ICD-10-CM | POA: Insufficient documentation

## 2020-12-25 DIAGNOSIS — Z8616 Personal history of COVID-19: Secondary | ICD-10-CM | POA: Insufficient documentation

## 2020-12-25 DIAGNOSIS — R059 Cough, unspecified: Secondary | ICD-10-CM | POA: Diagnosis not present

## 2020-12-25 DIAGNOSIS — M79641 Pain in right hand: Secondary | ICD-10-CM | POA: Diagnosis not present

## 2020-12-25 DIAGNOSIS — M059 Rheumatoid arthritis with rheumatoid factor, unspecified: Secondary | ICD-10-CM | POA: Diagnosis not present

## 2020-12-25 DIAGNOSIS — U071 COVID-19: Secondary | ICD-10-CM | POA: Diagnosis not present

## 2020-12-25 DIAGNOSIS — R4182 Altered mental status, unspecified: Secondary | ICD-10-CM | POA: Diagnosis not present

## 2020-12-25 DIAGNOSIS — R410 Disorientation, unspecified: Secondary | ICD-10-CM | POA: Diagnosis not present

## 2020-12-25 DIAGNOSIS — Z20822 Contact with and (suspected) exposure to covid-19: Secondary | ICD-10-CM | POA: Diagnosis not present

## 2020-12-25 DIAGNOSIS — Z634 Disappearance and death of family member: Secondary | ICD-10-CM | POA: Diagnosis not present

## 2020-12-25 LAB — CBC WITH DIFFERENTIAL/PLATELET
Abs Immature Granulocytes: 0.03 10*3/uL (ref 0.00–0.07)
Basophils Absolute: 0 10*3/uL (ref 0.0–0.1)
Basophils Relative: 0 %
Eosinophils Absolute: 0.2 10*3/uL (ref 0.0–0.5)
Eosinophils Relative: 3 %
HCT: 30.2 % — ABNORMAL LOW (ref 36.0–46.0)
Hemoglobin: 10.2 g/dL — ABNORMAL LOW (ref 12.0–15.0)
Immature Granulocytes: 1 %
Lymphocytes Relative: 27 %
Lymphs Abs: 1.8 10*3/uL (ref 0.7–4.0)
MCH: 31.9 pg (ref 26.0–34.0)
MCHC: 33.8 g/dL (ref 30.0–36.0)
MCV: 94.4 fL (ref 80.0–100.0)
Monocytes Absolute: 0.7 10*3/uL (ref 0.1–1.0)
Monocytes Relative: 10 %
Neutro Abs: 3.8 10*3/uL (ref 1.7–7.7)
Neutrophils Relative %: 59 %
Platelets: 283 10*3/uL (ref 150–400)
RBC: 3.2 MIL/uL — ABNORMAL LOW (ref 3.87–5.11)
RDW: 16.8 % — ABNORMAL HIGH (ref 11.5–15.5)
WBC: 6.4 10*3/uL (ref 4.0–10.5)
nRBC: 0 % (ref 0.0–0.2)

## 2020-12-25 LAB — COMPREHENSIVE METABOLIC PANEL
ALT: 22 U/L (ref 0–44)
AST: 28 U/L (ref 15–41)
Albumin: 3.7 g/dL (ref 3.5–5.0)
Alkaline Phosphatase: 100 U/L (ref 38–126)
Anion gap: 10 (ref 5–15)
BUN: 24 mg/dL — ABNORMAL HIGH (ref 8–23)
CO2: 22 mmol/L (ref 22–32)
Calcium: 9.3 mg/dL (ref 8.9–10.3)
Chloride: 105 mmol/L (ref 98–111)
Creatinine, Ser: 0.88 mg/dL (ref 0.44–1.00)
GFR, Estimated: >60 mL/min (ref >60)
Glucose, Bld: 211 mg/dL — ABNORMAL HIGH (ref 70–99)
Potassium: 4.3 mmol/L (ref 3.5–5.1)
Sodium: 137 mmol/L (ref 135–145)
Total Bilirubin: 0.8 mg/dL (ref 0.3–1.2)
Total Protein: 7.1 g/dL (ref 6.5–8.1)

## 2020-12-25 LAB — ETHANOL: Alcohol, Ethyl (B): <10 mg/dL (ref <10)

## 2020-12-25 LAB — TSH: TSH: 1.339 u[IU]/mL (ref 0.350–4.500)

## 2020-12-25 LAB — TROPONIN I (HIGH SENSITIVITY)
Troponin I (High Sensitivity): 7 ng/L (ref <18)
Troponin I (High Sensitivity): 12 ng/L (ref <18)

## 2020-12-25 MED ORDER — QUETIAPINE FUMARATE 25 MG PO TABS
12.5000 mg | ORAL_TABLET | Freq: Once | ORAL | Status: AC
  Administered 2020-12-25: 12.5 mg via ORAL
  Filled 2020-12-25: qty 1

## 2020-12-25 MED ORDER — ZIPRASIDONE HCL 40 MG PO CAPS
60.0000 mg | ORAL_CAPSULE | Freq: Once | ORAL | Status: DC
  Filled 2020-12-25: qty 1

## 2020-12-25 MED ORDER — LORAZEPAM 2 MG/ML IJ SOLN
1.0000 mg | Freq: Once | INTRAMUSCULAR | Status: AC
  Administered 2020-12-25: 1 mg via INTRAVENOUS
  Filled 2020-12-25: qty 1

## 2020-12-25 MED ORDER — ZIPRASIDONE MESYLATE 20 MG IM SOLR
10.0000 mg | Freq: Once | INTRAMUSCULAR | Status: AC
  Administered 2020-12-25: 10 mg via INTRAMUSCULAR
  Filled 2020-12-25: qty 20

## 2020-12-25 MED ORDER — ZIPRASIDONE HCL 80 MG PO CAPS: 80.0000 mg | ORAL_CAPSULE | Freq: Once | ORAL | Status: DC

## 2020-12-25 NOTE — ED Provider Notes (Signed)
[facility] Emergency Department Provider Note ____________________________________________   Event Date/Time   First MD Initiated Contact with Patient 12/25/20 1944     (approximate)  I have reviewed the triage vital signs and the nursing notes.   HISTORY  Chief Complaint Altered Mental Status  Level 5 caveat: History of present illness limited due to disorganized historian  HPI Diana Roberson is a 75 y.o. female with PMH as noted below including bipolar disorder on Geodon and Seroquel who presents with increased anxiety, restlessness and, and possible altered mental status.  The daughter reports that the patient's husband of more than 50 years died suddenly at the end of last month.  Since that time the patient has been staying with family.  She currently does not see a psychiatrist and the doses of her medications have not been changed for a long time, so the daughter reports that the family has been giving the Geodon every other day over the last week.  The patient has had increased anxiety, restlessness, has been pacing around and is unable to sleep.  The daughter is worried that the patient may be having an episode of bipolar disorder.  However, the daughter states that the patient has not done anything to harm herself or others and believes that the patient is not a danger to herself or others.  She reports that the patient has an appointment with a psychiatrist in 2 days.  The family has been giving cannabis Gummies occasionally to help with anxiety.  The patient herself mainly states that she feels confused and restless.  She denies headache, dizziness, shortness of breath, vomiting, or acute pain.  Past Medical History:  Diagnosis Date  . Allergy   . Bipolar 1 disorder (HCC)   . Chronic back pain   . Diabetes mellitus   . Difficult intubation   . Diverticulitis   . GERD (gastroesophageal reflux disease)   . Glaucoma   . Hyperlipidemia   .  Hypertension   . Mental health disorder    admitted x3  . Osteopenia   . Rheumatoid arthritis [facility])     Patient Active Problem List   Diagnosis Date Noted  . Pneumonia due to COVID-19 virus 11/13/2020  . AMS (altered mental status) 11/13/2020  . Sepsis (HCC) 11/13/2020  . Anemia 11/13/2020  . Gait disorder 11/08/2020  . OAB (overactive bladder) 07/19/2020  . Essential hypertension, benign 01/15/2020  . Plantar wart of left foot 01/13/2019  . Gastroesophageal reflux disease 11/01/2017  . Type 2 diabetes mellitus with circulatory disorder (HCC) 12/28/2015  . Seropositive rheumatoid arthritis (HCC) 12/28/2015  . Glaucoma 04/30/2015  . Advance directive discussed with patient 04/30/2015  . Routine general medical examination at a health care facility 12/06/2012  . CAROTID BRUIT 11/08/2010  . Bipolar 1 disorder, manic, full remission (HCC) 10/01/2009  . OSTEOPENIA 07/26/2007  . Hyperlipemia 01/22/2007  . DIVERTICULOSIS, COLON 01/08/2007  . Chronic back pain 01/08/2007    Past Surgical History:  Procedure Laterality Date  . APPENDECTOMY    . BACK SURGERY  1988  . BREAST CYST ASPIRATION     neg  . CHOLECYSTECTOMY    . COLONOSCOPY    . COLONOSCOPY WITH PROPOFOL N/A 11/27/2017   Procedure: COLONOSCOPY WITH PROPOFOL;  Surgeon: Christena Deem, MD;  Location: [facility] ENDOSCOPY;  Service: Endoscopy;  Laterality: N/A;  . HAND SURGERY    . ORIF ELBOW FRACTURE Right 06/24/2019   Procedure: OPEN REDUCTION INTERNAL FIXATION (ORIF) ELBOW/OLECRANON FRACTURE;  Surgeon: Christena Flake, MD;  Location: [facility] ORS;  Service: Orthopedics;  Laterality: Right;    Prior to Admission medications   Medication Sig Start Date End Date Taking? Authorizing Provider  aspirin EC 81 MG tablet Take 81 mg by mouth daily.    [provider]  atorvastatin (LIPITOR) 20 MG tablet TAKE 1 TABLET BY MOUTH  DAILY 09/02/20   Tillman Abide I, MD  brimonidine (ALPHAGAN) 0.2 % ophthalmic solution Place 1 drop  into both eyes 2 (two) times daily.    [provider]  calcium gluconate 500 MG tablet Take 1 tablet by mouth 2 (two) times daily.    [provider]  cholecalciferol (VITAMIN D) 1000 UNITS tablet Take 1,000 Units by mouth daily.    [provider]  dorzolamide-timolol (COSOPT) 22.3-6.8 MG/ML ophthalmic solution Place 1 drop into both eyes 2 (two) times daily.    [provider]  famotidine (PEPCID) 20 MG tablet Take 1 tablet (20 mg total) by mouth daily. In am 06/15/20   Karie Schwalbe, MD  folic acid (FOLVITE) 1 MG tablet Take 1 tablet (1 mg total) by mouth daily. 01/07/18   Karie Schwalbe, MD  gabapentin (NEURONTIN) 300 MG capsule Take 300 mg by mouth at bedtime. 10/04/20   [provider]  glucose blood (ONETOUCH VERIO) test strip Use to check blood sugar once a day. Dx Code E11.9 01/15/19   Karie Schwalbe, MD  hydroxychloroquine (PLAQUENIL) 200 MG tablet Take 200 mg by mouth daily. 10/11/20   [provider]  lisinopril (ZESTRIL) 10 MG tablet Take 1 tablet (10 mg total) by mouth daily. 11/08/20   Karie Schwalbe, MD  metFORMIN (GLUCOPHAGE) 500 MG tablet Take 2 tablets (1,000 mg total) by mouth 2 (two) times daily with a meal. 04/30/20   Karie Schwalbe, MD  methotrexate (RHEUMATREX) 2.5 MG tablet Take 7 tablets (17.5 mg total) by mouth once a week. Caution:Chemotherapy. Protect from light. 09/21/20   Karie Schwalbe, MD  mirabegron ER (MYRBETRIQ) 25 MG TB24 tablet Take 1 tablet (25 mg total) by mouth daily. 07/19/20   Karie Schwalbe, MD  OneTouch Delica Lancets 33G MISC 1 each by Does not apply route daily. Dx Code E11.9 01/15/19   Karie Schwalbe, MD  QUEtiapine (SEROQUEL) 25 MG tablet Take 0.5-1 tablets (12.5-25 mg total) by mouth at bedtime. 12/03/20   Karie Schwalbe, MD  vitamin B-12 (CYANOCOBALAMIN) 1000 MCG tablet Take 1,000 mcg by mouth daily.    [provider]  vitamin E 400 UNIT capsule Take 400 Units by mouth  daily.    [provider]  ziprasidone (GEODON) 60 MG capsule Take 1 capsule (60 mg total) by mouth at bedtime. 12/23/20   Karie Schwalbe, MD    Allergies Codeine sulfate and Sertraline hcl  Family History  Problem Relation Age of Onset  . Diabetes Mother   . Hypertension Mother   . Stroke Sister   . Hashimoto's thyroiditis Daughter   . Stroke Father   . Stroke Sister   . Cancer Neg Hx   . Breast cancer Neg Hx     Social History Social History   Tobacco Use  . Smoking status: Never Smoker  . Smokeless tobacco: Never Used  Vaping Use  . Vaping Use: Never used  Substance Use Topics  . Alcohol use: No  . Drug use: No    Review of Systems 05 Kabbe: Review of systems limited due to disorganized  historian  Cardiovascular: Denies chest pain. Respiratory: Denies shortness of breath. Gastrointestinal: No vomiting. Genitourinary: Negative for dysuria.  Neurological: Negative for headache.   ____________________________________________   PHYSICAL EXAM:  VITAL SIGNS: ED Triage Vitals  Enc Vitals Group     BP 12/25/20 1915 (!) 150/80     Pulse Rate 12/25/20 1915 95     Resp 12/25/20 1915 18     Temp 12/25/20 1915 98.7 F (37.1 C)     Temp Source 12/25/20 1915 Oral     SpO2 12/25/20 1915 97 %     Weight 12/25/20 1915 130 lb (59 kg)     Height 12/25/20 1915  (1.575 m)     Head Circumference --      Peak Flow --      Pain Score 12/25/20 1914 0     Pain Loc --      Pain Edu? --      Excl. in GC? --     Constitutional: Alert and oriented.  Anxious and restless appearing but in no acute distress. Eyes: Conjunctivae are normal.  EOMI.  PERRLA. Head: Atraumatic. Nose: No congestion/rhinnorhea. Mouth/Throat: Mucous membranes are slightly dry.   Neck: Normal range of motion.  Cardiovascular: Normal rate, regular rhythm. Grossly normal heart sounds.  Good peripheral circulation. Respiratory: Normal respiratory effort.  No retractions. Lungs  CTAB. Gastrointestinal: Soft and nontender. No distention.  Genitourinary: No flank tenderness. Musculoskeletal: No lower extremity edema.  Extremities warm and well perfused.  Neurologic:  Normal speech and language.  Motor intact in all extremities.  Somewhat shuffling gait. Skin:  Skin is warm and dry. No rash noted. Psychiatric: Flat affect.  Somewhat restless and anxious appearing.  ____________________________________________   LABS (all labs ordered are listed, but only abnormal results are displayed)  Labs Reviewed  CBC WITH DIFFERENTIAL/PLATELET - Abnormal; Notable for the following components:      Result Value   RBC 3.20 (*)    Hemoglobin 10.2 (*)    HCT 30.2 (*)    RDW 16.8 (*)    All other components within normal limits  COMPREHENSIVE METABOLIC PANEL - Abnormal; Notable for the following components:   Glucose, Bld 211 (*)    BUN 24 (*)    All other components within normal limits  ETHANOL  TSH  URINALYSIS, COMPLETE (UACMP) WITH MICROSCOPIC  TROPONIN I (HIGH SENSITIVITY)  TROPONIN I (HIGH SENSITIVITY)   ____________________________________________  EKG  ED ECG REPORT I, Dionne Bucy, the attending physician, personally viewed and interpreted this ECG.  Date: 12/25/2020 EKG Time: 2119 Rate: 88 Rhythm: normal sinus rhythm QRS Axis: normal Intervals: normal ST/T Wave abnormalities: normal Narrative Interpretation: no evidence of acute ischemia  ____________________________________________  RADIOLOGY  CT head: No ICH or other acute abnormality  ____________________________________________   PROCEDURES  Procedure(s) performed: No  Procedures  Critical Care performed: No ____________________________________________   INITIAL IMPRESSION / ASSESSMENT AND PLAN / ED COURSE  Pertinent labs & imaging results that were available during my care of the patient were reviewed by me and considered in my medical decision making (see chart for  details).  75 year old female with a history of bipolar disorder and other PMH as noted above including diabetes, hypertension, GERD, and hyperlipidemia presents with increased anxiety, restlessness, and possible confusion over the last few weeks.  The family has decreased the frequency of her Geodon, and she has had some recent life stressors.  I reviewed the past medical records in Epic.  The patient was most  recently admitted in March of this year with altered mental status and COVID.  She apparently had discontinued the Seroquel before this and then restarted it which led to possible oversedation.  On exam the patient is overall comfortable appearing but anxious and somewhat restless, repeatedly getting up during my evaluation to pace around the room.  Her vital signs are normal except for hypertension and borderline tachycardia.  Neurologic exam is nonfocal.  Mucous membranes are slightly dry.  The physical exam is otherwise unremarkable.  Differential includes an exacerbation of the patient's bipolar disorder, possibly brought on by medication changes and/or acute life stressors, however differential also includes organic causes including dehydration, other metabolic disturbance, thyroid dysfunction, UTI, or CNS cause.  We will obtain a CT of the head and lab work-up, and will give the patient Ativan for anxiety.  The daughter feels confident that the patient is not in acute danger to self and others although is not sure if the family can adequately manage her at home.  I have not placed the patient under IVC, however if the medical work-up is negative the patient will likely benefit benefit from psychiatry evaluation.   ----------------------------------------- 12:33 AM on 12/26/2020 -----------------------------------------  The patient continued to be very anxious and restless after Ativan.  We gave a dose of IM Geodon and she is now resting comfortably.  I consulted psychiatry, and NP  has seen the patient and talk to the daughter.  The patient is pending urinalysis but is otherwise medically cleared.  I signed her out to the ED physician Dr. Dolores Frame.  Disposition will be based on psychiatry team recommendations.  ____________________________________________   FINAL CLINICAL IMPRESSION(S) / ED DIAGNOSES  Final diagnoses:  Anxiety  Bipolar affective disorder, remission status unspecified (HCC)      NEW MEDICATIONS STARTED DURING THIS VISIT:  New Prescriptions   No medications on file     Note:  This document was prepared using Dragon voice recognition software and may include unintentional dictation errors.   Dionne Bucy, MD 12/26/20 [phone]

## 2020-12-25 NOTE — ED Notes (Addendum)
Patient refused VS. Patient' daughter states "I do not want you to wake her up to get VS".

## 2020-12-25 NOTE — ED Notes (Signed)
Patient and daughter attempting to leave. This RN and ED provider bedside to speak with patient and daughter.  Daughter concerned about patient because of medications and hx of bipolar disorder. Patient's husband passed away a year ago and daughter is concerned that patient isn't handling it well.

## 2020-12-25 NOTE — ED Triage Notes (Signed)
Pt presents to ER with her daughter.  Pt states she is here d/t confusion.  Daughter states this has been going on since pt's husband died at the end of 12/14/2022.  Daughter states pt has had increasing levels of anxiety, confusion and occasional catatonia and has been pacing back and forth, and not talking very much.  Pt has been taking geodon and seroquil for BPD, but has been having to cut dosage down d/t side effects.

## 2020-12-25 NOTE — ED Notes (Signed)
Psych @ bedside

## 2020-12-25 NOTE — ED Notes (Signed)
Pt given a sandwich tray ?

## 2020-12-25 NOTE — ED Notes (Signed)
Patient attempting to get out of bed, shuffling gait. EDP informed  About patient's daughter attempting to walk patient out.

## 2020-12-26 DIAGNOSIS — Z7982 Long term (current) use of aspirin: Secondary | ICD-10-CM | POA: Diagnosis not present

## 2020-12-26 DIAGNOSIS — M059 Rheumatoid arthritis with rheumatoid factor, unspecified: Secondary | ICD-10-CM | POA: Diagnosis not present

## 2020-12-26 DIAGNOSIS — Z634 Disappearance and death of family member: Secondary | ICD-10-CM | POA: Diagnosis not present

## 2020-12-26 DIAGNOSIS — Z9049 Acquired absence of other specified parts of digestive tract: Secondary | ICD-10-CM | POA: Diagnosis not present

## 2020-12-26 DIAGNOSIS — Z79899 Other long term (current) drug therapy: Secondary | ICD-10-CM | POA: Diagnosis not present

## 2020-12-26 DIAGNOSIS — I10 Essential (primary) hypertension: Secondary | ICD-10-CM | POA: Diagnosis not present

## 2020-12-26 DIAGNOSIS — R410 Disorientation, unspecified: Secondary | ICD-10-CM | POA: Diagnosis not present

## 2020-12-26 DIAGNOSIS — R296 Repeated falls: Secondary | ICD-10-CM | POA: Diagnosis not present

## 2020-12-26 DIAGNOSIS — M79641 Pain in right hand: Secondary | ICD-10-CM | POA: Diagnosis not present

## 2020-12-26 DIAGNOSIS — H409 Unspecified glaucoma: Secondary | ICD-10-CM | POA: Diagnosis not present

## 2020-12-26 DIAGNOSIS — R059 Cough, unspecified: Secondary | ICD-10-CM | POA: Diagnosis not present

## 2020-12-26 DIAGNOSIS — D649 Anemia, unspecified: Secondary | ICD-10-CM | POA: Diagnosis not present

## 2020-12-26 DIAGNOSIS — Z885 Allergy status to narcotic agent status: Secondary | ICD-10-CM | POA: Diagnosis not present

## 2020-12-26 DIAGNOSIS — S52611A Displaced fracture of right ulna styloid process, initial encounter for closed fracture: Secondary | ICD-10-CM | POA: Diagnosis not present

## 2020-12-26 DIAGNOSIS — E785 Hyperlipidemia, unspecified: Secondary | ICD-10-CM | POA: Diagnosis not present

## 2020-12-26 DIAGNOSIS — Z20822 Contact with and (suspected) exposure to covid-19: Secondary | ICD-10-CM | POA: Diagnosis not present

## 2020-12-26 DIAGNOSIS — S52201A Unspecified fracture of shaft of right ulna, initial encounter for closed fracture: Secondary | ICD-10-CM | POA: Diagnosis not present

## 2020-12-26 DIAGNOSIS — E119 Type 2 diabetes mellitus without complications: Secondary | ICD-10-CM | POA: Diagnosis not present

## 2020-12-26 DIAGNOSIS — U071 COVID-19: Secondary | ICD-10-CM | POA: Diagnosis not present

## 2020-12-26 LAB — URINALYSIS, COMPLETE (UACMP) WITH MICROSCOPIC
Bacteria, UA: NONE SEEN
Bilirubin Urine: NEGATIVE
Glucose, UA: NEGATIVE mg/dL
Hgb urine dipstick: NEGATIVE
Ketones, ur: NEGATIVE mg/dL
Nitrite: NEGATIVE
Protein, ur: NEGATIVE mg/dL
Specific Gravity, Urine: 1.006 (ref 1.005–1.030)
Squamous Epithelial / HPF: NONE SEEN (ref 0–5)
pH: 5 (ref 5.0–8.0)

## 2020-12-26 LAB — URINE DRUG SCREEN, QUALITATIVE (ARMC ONLY)
Amphetamines, Ur Screen: NOT DETECTED
Barbiturates, Ur Screen: NOT DETECTED
Benzodiazepine, Ur Scrn: NOT DETECTED
Cannabinoid 50 Ng, Ur ~~LOC~~: POSITIVE — AB
Cocaine Metabolite,Ur ~~LOC~~: NOT DETECTED
MDMA (Ecstasy)Ur Screen: NOT DETECTED
Methadone Scn, Ur: NOT DETECTED
Opiate, Ur Screen: NOT DETECTED
Phencyclidine (PCP) Ur S: NOT DETECTED
Tricyclic, Ur Screen: NOT DETECTED

## 2020-12-26 MED ORDER — QUETIAPINE FUMARATE 25 MG PO TABS: 12.5000 mg | ORAL_TABLET | Freq: Every day | ORAL | Status: DC

## 2020-12-26 MED ORDER — CALCIUM GLUCONATE 500 MG PO TABS: 1.0000 | ORAL_TABLET | Freq: Two times a day (BID) | ORAL | Status: DC

## 2020-12-26 MED ORDER — GABAPENTIN 300 MG PO CAPS: 300.0000 mg | ORAL_CAPSULE | Freq: Every day | ORAL | Status: DC

## 2020-12-26 MED ORDER — VITAMIN E 45 MG (100 UNIT) PO CAPS
400.0000 [IU] | ORAL_CAPSULE | Freq: Every day | ORAL | Status: DC
  Administered 2020-12-26: 400 [IU] via ORAL
  Filled 2020-12-26: qty 4

## 2020-12-26 MED ORDER — METFORMIN HCL 500 MG PO TABS
1000.0000 mg | ORAL_TABLET | Freq: Two times a day (BID) | ORAL | Status: DC
  Administered 2020-12-26: 1000 mg via ORAL
  Filled 2020-12-26: qty 2

## 2020-12-26 MED ORDER — ZIPRASIDONE MESYLATE 20 MG IM SOLR
10.0000 mg | Freq: Once | INTRAMUSCULAR | Status: DC
  Filled 2020-12-26 (×2): qty 20

## 2020-12-26 MED ORDER — ASPIRIN EC 81 MG PO TBEC
81.0000 mg | DELAYED_RELEASE_TABLET | Freq: Every day | ORAL | Status: DC
  Administered 2020-12-26: 81 mg via ORAL
  Filled 2020-12-26: qty 1

## 2020-12-26 MED ORDER — DORZOLAMIDE HCL-TIMOLOL MAL 2-0.5 % OP SOLN
1.0000 [drp] | Freq: Two times a day (BID) | OPHTHALMIC | Status: DC
  Administered 2020-12-26: 1 [drp] via OPHTHALMIC
  Filled 2020-12-26: qty 10

## 2020-12-26 MED ORDER — FOSFOMYCIN TROMETHAMINE 3 G PO PACK
3.0000 g | PACK | Freq: Once | ORAL | Status: DC
  Filled 2020-12-26: qty 3

## 2020-12-26 MED ORDER — MIRABEGRON ER 25 MG PO TB24
25.0000 mg | ORAL_TABLET | Freq: Every day | ORAL | Status: DC
  Filled 2020-12-26: qty 1

## 2020-12-26 MED ORDER — FAMOTIDINE 20 MG PO TABS
20.0000 mg | ORAL_TABLET | Freq: Every day | ORAL | Status: DC
  Administered 2020-12-26: 20 mg via ORAL
  Filled 2020-12-26: qty 1

## 2020-12-26 MED ORDER — BRIMONIDINE TARTRATE 0.2 % OP SOLN
1.0000 [drp] | Freq: Two times a day (BID) | OPHTHALMIC | Status: DC
  Administered 2020-12-26: 1 [drp] via OPHTHALMIC
  Filled 2020-12-26: qty 5

## 2020-12-26 MED ORDER — VITAMIN D 25 MCG (1000 UNIT) PO TABS
1000.0000 [IU] | ORAL_TABLET | Freq: Every day | ORAL | Status: DC
  Administered 2020-12-26: 1000 [IU] via ORAL
  Filled 2020-12-26: qty 1

## 2020-12-26 MED ORDER — ZIPRASIDONE HCL 60 MG PO CAPS: 60.0000 mg | ORAL_CAPSULE | Freq: Every day | ORAL | 3 refills | Status: DC

## 2020-12-26 MED ORDER — LISINOPRIL 10 MG PO TABS
10.0000 mg | ORAL_TABLET | Freq: Every day | ORAL | Status: DC
  Filled 2020-12-26: qty 1

## 2020-12-26 MED ORDER — VITAMIN B-12 1000 MCG PO TABS
1000.0000 ug | ORAL_TABLET | Freq: Every day | ORAL | Status: DC
  Administered 2020-12-26: 1000 ug via ORAL
  Filled 2020-12-26: qty 1

## 2020-12-26 MED ORDER — ATORVASTATIN CALCIUM 20 MG PO TABS
20.0000 mg | ORAL_TABLET | Freq: Every day | ORAL | Status: DC
  Filled 2020-12-26: qty 1

## 2020-12-26 MED ORDER — HYDROXYCHLOROQUINE SULFATE 200 MG PO TABS
200.0000 mg | ORAL_TABLET | Freq: Every day | ORAL | Status: DC
  Filled 2020-12-26: qty 1

## 2020-12-26 MED ORDER — FOLIC ACID 1 MG PO TABS
1.0000 mg | ORAL_TABLET | Freq: Every day | ORAL | Status: DC
  Administered 2020-12-26: 1 mg via ORAL
  Filled 2020-12-26: qty 1

## 2020-12-26 NOTE — ED Notes (Signed)
This RN spoke with patient's daughter bedside, who appeared fatigued and stated she was "stressed out and hasn't been able to sleep because of caring for her mother". Patient's daughter states "I wish she would just give up and that there was someone to assist her with suicide". This RN spoke with her about caregiver fatigue, daughter states "I think she needs to go into a psych ward because I cannot care for her anymore, I am too tired and she is too much". Patient's daughter had been growing increasingly frustrated with her mother throughout the evening.  Patient sleeping at this time. EDP informed, safety sitter requested so that daughter can return home.

## 2020-12-26 NOTE — BH Assessment (Signed)
Writer unable to complete TTS consult because patient discharged prior to been seen by TTS/Writer.

## 2020-12-26 NOTE — ED Notes (Signed)
Called [facility] for consult spoke to [place] 0920

## 2020-12-26 NOTE — ED Notes (Signed)
Vol/pending psych consult. 

## 2020-12-26 NOTE — ED Notes (Signed)
Pt resting in bed with eyes closed at this time.  Chest rise and fall noted.  No needs identified.

## 2020-12-26 NOTE — ED Provider Notes (Signed)
Emergency Medicine Observation Re-evaluation Note  Diana Roberson is a 75 y.o. female, seen on rounds today.  Pt initially presented to the ED for complaints of Altered Mental Status Currently, the patient is resting, voices no medical complaints.  Physical Exam  BP (!) 94/50 (BP Location: Left Arm)   Pulse 80   Temp 98.7 F (37.1 C) (Oral)   Resp 16   Ht 5\' 2"  (1.575 m)   Wt 59 kg   SpO2 96%   BMI 23.78 kg/m  Physical Exam General: Resting in NAD Cardiac: No cyanosis Lungs: Equal rise and fall Psych: Not agitated  ED Course / MDM  EKG:   I have reviewed the labs performed to date as well as medications administered while in observation.  Recent changes in the last 24 hours include no changes overnight.  Plan  Current plan is for psychiatric disposition. Patient is not under full IVC at this time.   , MD 12/26/20 [phone]

## 2020-12-26 NOTE — ED Notes (Signed)
Pts daughter states that they want to leave. Pt has a psychiatrist appointment tomorrow where the patients medications and treatment will be discussed. Katrinka Blazing, MD informed.

## 2020-12-26 NOTE — ED Notes (Signed)
Patient is resting comfortably. Call light in reach. Fall precautions in place. Pts daughter at bedside.

## 2020-12-26 NOTE — BH Assessment (Signed)
Unable to assess due to pt being medicated. TTS to follow up.

## 2020-12-26 NOTE — BH Assessment (Signed)
Unable to assess due to pt being medicated. Psychiatry to follow up.

## 2020-12-26 NOTE — ED Provider Notes (Signed)
I assumed care of this patient approximately 0 700.  In brief patient presents with worsening bipolar disorder and concern for decompensated mania she has been pacing incessantly at home with some increasing confusion.  Plan is for psychiatry to see her tomorrow although it daughter at bedside during my shift stated she thought the patient would better be served by waiting for psychiatry at home that she has an appointment tomorrow.  She is already on Geodon it is requesting a refill of this.  He does not think patient is in imminent danger to herself or anyone else and will be with patient until she got seen by her psychiatrist.  Unable to participate in conversation secondary to some confusion.  Given otherwise unremarkable work-up which was reviewed including labs CT head suspect likely this is from decompensated bipolar disorder.  Advised patient's daughter that we are happy to observe her emergency room until she was seen by [facility] psych but if she absolutely wants to take her mother home and will be with her until she can get her psychiatry appointment tomorrow I think this is reasonable.  This is their friend choice.  Patient discharged stable condition.  Strict return cautions advised and discussed   Gilles Chiquito, MD 12/26/20 [phone]

## 2020-12-26 NOTE — ED Notes (Signed)
Patient is resting comfortably. Call light within reach. Fall precautions in place. Pts daughter at bedside.

## 2020-12-26 NOTE — ED Notes (Signed)
PT Belongings:   [employer] V-neck top [employer] Zip up jacket [employer] and black socks Eye glasses (left out of bag)

## 2020-12-26 NOTE — ED Notes (Signed)
Patient has 1:1 safety sitter bedside.

## 2020-12-26 NOTE — ED Notes (Signed)
Pt resting in stretcher with eyes closed at this time.  No safety sitter at bedside d/t staffing issues.  Pt not voicing any needs at this time.  Pt awaiting psych consult.

## 2020-12-27 ENCOUNTER — Ambulatory Visit: Payer: Self-pay | Admitting: Psychiatry

## 2020-12-27 ENCOUNTER — Other Ambulatory Visit: Payer: Self-pay

## 2020-12-27 DIAGNOSIS — M059 Rheumatoid arthritis with rheumatoid factor, unspecified: Secondary | ICD-10-CM | POA: Diagnosis not present

## 2020-12-27 DIAGNOSIS — U071 COVID-19: Secondary | ICD-10-CM | POA: Diagnosis not present

## 2020-12-27 DIAGNOSIS — E119 Type 2 diabetes mellitus without complications: Secondary | ICD-10-CM | POA: Diagnosis not present

## 2020-12-28 DIAGNOSIS — U071 COVID-19: Secondary | ICD-10-CM | POA: Diagnosis not present

## 2020-12-28 DIAGNOSIS — E119 Type 2 diabetes mellitus without complications: Secondary | ICD-10-CM | POA: Diagnosis not present

## 2020-12-28 DIAGNOSIS — M059 Rheumatoid arthritis with rheumatoid factor, unspecified: Secondary | ICD-10-CM | POA: Diagnosis not present

## 2020-12-29 DIAGNOSIS — E119 Type 2 diabetes mellitus without complications: Secondary | ICD-10-CM | POA: Diagnosis not present

## 2020-12-29 DIAGNOSIS — M059 Rheumatoid arthritis with rheumatoid factor, unspecified: Secondary | ICD-10-CM | POA: Diagnosis not present

## 2020-12-29 DIAGNOSIS — U071 COVID-19: Secondary | ICD-10-CM | POA: Diagnosis not present

## 2020-12-29 DIAGNOSIS — G2571 Drug induced akathisia: Secondary | ICD-10-CM | POA: Diagnosis not present

## 2020-12-29 DIAGNOSIS — S52201A Unspecified fracture of shaft of right ulna, initial encounter for closed fracture: Secondary | ICD-10-CM | POA: Diagnosis not present

## 2021-01-01 DIAGNOSIS — M069 Rheumatoid arthritis, unspecified: Secondary | ICD-10-CM | POA: Diagnosis not present

## 2021-01-01 DIAGNOSIS — E785 Hyperlipidemia, unspecified: Secondary | ICD-10-CM | POA: Diagnosis not present

## 2021-01-01 DIAGNOSIS — H409 Unspecified glaucoma: Secondary | ICD-10-CM | POA: Diagnosis not present

## 2021-01-01 DIAGNOSIS — M0509 Felty's syndrome, multiple sites: Secondary | ICD-10-CM | POA: Diagnosis not present

## 2021-01-01 DIAGNOSIS — E119 Type 2 diabetes mellitus without complications: Secondary | ICD-10-CM | POA: Diagnosis not present

## 2021-01-01 DIAGNOSIS — S52611D Displaced fracture of right ulna styloid process, subsequent encounter for closed fracture with routine healing: Secondary | ICD-10-CM | POA: Diagnosis not present

## 2021-01-01 DIAGNOSIS — Z9181 History of falling: Secondary | ICD-10-CM | POA: Diagnosis not present

## 2021-01-01 DIAGNOSIS — Z7984 Long term (current) use of oral hypoglycemic drugs: Secondary | ICD-10-CM | POA: Diagnosis not present

## 2021-01-01 DIAGNOSIS — Z7982 Long term (current) use of aspirin: Secondary | ICD-10-CM | POA: Diagnosis not present

## 2021-01-01 DIAGNOSIS — I10 Essential (primary) hypertension: Secondary | ICD-10-CM | POA: Diagnosis not present

## 2021-01-01 DIAGNOSIS — D649 Anemia, unspecified: Secondary | ICD-10-CM | POA: Diagnosis not present

## 2021-01-03 DIAGNOSIS — M069 Rheumatoid arthritis, unspecified: Secondary | ICD-10-CM | POA: Diagnosis not present

## 2021-01-03 DIAGNOSIS — H409 Unspecified glaucoma: Secondary | ICD-10-CM | POA: Diagnosis not present

## 2021-01-03 DIAGNOSIS — E119 Type 2 diabetes mellitus without complications: Secondary | ICD-10-CM | POA: Diagnosis not present

## 2021-01-03 DIAGNOSIS — D649 Anemia, unspecified: Secondary | ICD-10-CM | POA: Diagnosis not present

## 2021-01-03 DIAGNOSIS — E785 Hyperlipidemia, unspecified: Secondary | ICD-10-CM | POA: Diagnosis not present

## 2021-01-03 DIAGNOSIS — Z7984 Long term (current) use of oral hypoglycemic drugs: Secondary | ICD-10-CM | POA: Diagnosis not present

## 2021-01-03 DIAGNOSIS — M0509 Felty's syndrome, multiple sites: Secondary | ICD-10-CM | POA: Diagnosis not present

## 2021-01-03 DIAGNOSIS — Z7982 Long term (current) use of aspirin: Secondary | ICD-10-CM | POA: Diagnosis not present

## 2021-01-03 DIAGNOSIS — S52611D Displaced fracture of right ulna styloid process, subsequent encounter for closed fracture with routine healing: Secondary | ICD-10-CM | POA: Diagnosis not present

## 2021-01-03 DIAGNOSIS — Z9181 History of falling: Secondary | ICD-10-CM | POA: Diagnosis not present

## 2021-01-03 DIAGNOSIS — I10 Essential (primary) hypertension: Secondary | ICD-10-CM | POA: Diagnosis not present

## 2021-01-04 ENCOUNTER — Telehealth: Payer: Self-pay

## 2021-01-04 DIAGNOSIS — M069 Rheumatoid arthritis, unspecified: Secondary | ICD-10-CM | POA: Diagnosis not present

## 2021-01-04 DIAGNOSIS — Z9181 History of falling: Secondary | ICD-10-CM | POA: Diagnosis not present

## 2021-01-04 DIAGNOSIS — E785 Hyperlipidemia, unspecified: Secondary | ICD-10-CM | POA: Diagnosis not present

## 2021-01-04 DIAGNOSIS — I10 Essential (primary) hypertension: Secondary | ICD-10-CM | POA: Diagnosis not present

## 2021-01-04 DIAGNOSIS — S52611D Displaced fracture of right ulna styloid process, subsequent encounter for closed fracture with routine healing: Secondary | ICD-10-CM | POA: Diagnosis not present

## 2021-01-04 DIAGNOSIS — Z7982 Long term (current) use of aspirin: Secondary | ICD-10-CM | POA: Diagnosis not present

## 2021-01-04 DIAGNOSIS — Z7984 Long term (current) use of oral hypoglycemic drugs: Secondary | ICD-10-CM | POA: Diagnosis not present

## 2021-01-04 DIAGNOSIS — D649 Anemia, unspecified: Secondary | ICD-10-CM | POA: Diagnosis not present

## 2021-01-04 DIAGNOSIS — H409 Unspecified glaucoma: Secondary | ICD-10-CM | POA: Diagnosis not present

## 2021-01-04 DIAGNOSIS — M0509 Felty's syndrome, multiple sites: Secondary | ICD-10-CM | POA: Diagnosis not present

## 2021-01-04 DIAGNOSIS — E119 Type 2 diabetes mellitus without complications: Secondary | ICD-10-CM | POA: Diagnosis not present

## 2021-01-04 NOTE — Telephone Encounter (Signed)
Requesting verbal orders for PT twice a week for three weeks and a HH Aide for once a week for one week and twice a week for two weeks. Please advise.

## 2021-01-05 ENCOUNTER — Telehealth: Payer: Self-pay

## 2021-01-05 NOTE — Telephone Encounter (Signed)
Okay for proposed therapy--I have been signing orders. Please check with her---I think she moved out of the area with her daughter---confirm this and find out if she is going to continue in my care

## 2021-01-05 NOTE — Telephone Encounter (Signed)
Requesting verbal orders for a cognitive evaluation by SLP. Please advise.

## 2021-01-05 NOTE — Telephone Encounter (Signed)
Spoke to pt's daughter. She said the pt is doing better and may be able to stay at home with some help in a few weeks. If she is not able to do that, she will let us know and she will change to a PCP in the [place] area.

## 2021-01-06 DIAGNOSIS — Z7984 Long term (current) use of oral hypoglycemic drugs: Secondary | ICD-10-CM | POA: Diagnosis not present

## 2021-01-06 DIAGNOSIS — D649 Anemia, unspecified: Secondary | ICD-10-CM | POA: Diagnosis not present

## 2021-01-06 DIAGNOSIS — H409 Unspecified glaucoma: Secondary | ICD-10-CM | POA: Diagnosis not present

## 2021-01-06 DIAGNOSIS — Z9181 History of falling: Secondary | ICD-10-CM | POA: Diagnosis not present

## 2021-01-06 DIAGNOSIS — E785 Hyperlipidemia, unspecified: Secondary | ICD-10-CM | POA: Diagnosis not present

## 2021-01-06 DIAGNOSIS — I10 Essential (primary) hypertension: Secondary | ICD-10-CM | POA: Diagnosis not present

## 2021-01-06 DIAGNOSIS — E119 Type 2 diabetes mellitus without complications: Secondary | ICD-10-CM | POA: Diagnosis not present

## 2021-01-06 DIAGNOSIS — M0509 Felty's syndrome, multiple sites: Secondary | ICD-10-CM | POA: Diagnosis not present

## 2021-01-06 DIAGNOSIS — M069 Rheumatoid arthritis, unspecified: Secondary | ICD-10-CM | POA: Diagnosis not present

## 2021-01-06 DIAGNOSIS — S52611D Displaced fracture of right ulna styloid process, subsequent encounter for closed fracture with routine healing: Secondary | ICD-10-CM | POA: Diagnosis not present

## 2021-01-06 DIAGNOSIS — Z7982 Long term (current) use of aspirin: Secondary | ICD-10-CM | POA: Diagnosis not present

## 2021-01-06 NOTE — Telephone Encounter (Signed)
Left verbal orders on verified VM 

## 2021-01-06 NOTE — Telephone Encounter (Signed)
Okay--that sounds perfectly appropriate.

## 2021-01-06 NOTE — Telephone Encounter (Signed)
Okay to proceed with the cognitive evaluation

## 2021-01-07 DIAGNOSIS — S52611D Displaced fracture of right ulna styloid process, subsequent encounter for closed fracture with routine healing: Secondary | ICD-10-CM | POA: Diagnosis not present

## 2021-01-07 DIAGNOSIS — Z9181 History of falling: Secondary | ICD-10-CM | POA: Diagnosis not present

## 2021-01-07 DIAGNOSIS — M069 Rheumatoid arthritis, unspecified: Secondary | ICD-10-CM | POA: Diagnosis not present

## 2021-01-07 DIAGNOSIS — H409 Unspecified glaucoma: Secondary | ICD-10-CM | POA: Diagnosis not present

## 2021-01-07 DIAGNOSIS — D649 Anemia, unspecified: Secondary | ICD-10-CM | POA: Diagnosis not present

## 2021-01-07 DIAGNOSIS — E785 Hyperlipidemia, unspecified: Secondary | ICD-10-CM | POA: Diagnosis not present

## 2021-01-07 DIAGNOSIS — M0509 Felty's syndrome, multiple sites: Secondary | ICD-10-CM | POA: Diagnosis not present

## 2021-01-07 DIAGNOSIS — Z7982 Long term (current) use of aspirin: Secondary | ICD-10-CM | POA: Diagnosis not present

## 2021-01-07 DIAGNOSIS — E119 Type 2 diabetes mellitus without complications: Secondary | ICD-10-CM | POA: Diagnosis not present

## 2021-01-07 DIAGNOSIS — I10 Essential (primary) hypertension: Secondary | ICD-10-CM | POA: Diagnosis not present

## 2021-01-07 DIAGNOSIS — Z7984 Long term (current) use of oral hypoglycemic drugs: Secondary | ICD-10-CM | POA: Diagnosis not present

## 2021-01-11 ENCOUNTER — Telehealth: Payer: Self-pay

## 2021-01-11 DIAGNOSIS — D649 Anemia, unspecified: Secondary | ICD-10-CM | POA: Diagnosis not present

## 2021-01-11 DIAGNOSIS — E119 Type 2 diabetes mellitus without complications: Secondary | ICD-10-CM | POA: Diagnosis not present

## 2021-01-11 DIAGNOSIS — Z7982 Long term (current) use of aspirin: Secondary | ICD-10-CM | POA: Diagnosis not present

## 2021-01-11 DIAGNOSIS — I10 Essential (primary) hypertension: Secondary | ICD-10-CM | POA: Diagnosis not present

## 2021-01-11 DIAGNOSIS — S52611D Displaced fracture of right ulna styloid process, subsequent encounter for closed fracture with routine healing: Secondary | ICD-10-CM | POA: Diagnosis not present

## 2021-01-11 DIAGNOSIS — Z7984 Long term (current) use of oral hypoglycemic drugs: Secondary | ICD-10-CM | POA: Diagnosis not present

## 2021-01-11 DIAGNOSIS — Z9181 History of falling: Secondary | ICD-10-CM | POA: Diagnosis not present

## 2021-01-11 DIAGNOSIS — M069 Rheumatoid arthritis, unspecified: Secondary | ICD-10-CM | POA: Diagnosis not present

## 2021-01-11 DIAGNOSIS — M0509 Felty's syndrome, multiple sites: Secondary | ICD-10-CM | POA: Diagnosis not present

## 2021-01-11 DIAGNOSIS — H409 Unspecified glaucoma: Secondary | ICD-10-CM | POA: Diagnosis not present

## 2021-01-11 DIAGNOSIS — E785 Hyperlipidemia, unspecified: Secondary | ICD-10-CM | POA: Diagnosis not present

## 2021-01-11 NOTE — Telephone Encounter (Signed)
Rehul HH PT with [facility] saw pt today for [facility] PT and wanted to let Dr Alphonsus Sias know pt had fall on 01/08/21; no apparent injury, pt did well with [facility] PT session today. Just FYI to Dr Alphonsus Sias.

## 2021-01-12 ENCOUNTER — Telehealth: Payer: Self-pay | Admitting: Internal Medicine

## 2021-01-12 DIAGNOSIS — I10 Essential (primary) hypertension: Secondary | ICD-10-CM | POA: Diagnosis not present

## 2021-01-12 DIAGNOSIS — Z7984 Long term (current) use of oral hypoglycemic drugs: Secondary | ICD-10-CM | POA: Diagnosis not present

## 2021-01-12 DIAGNOSIS — Z7982 Long term (current) use of aspirin: Secondary | ICD-10-CM | POA: Diagnosis not present

## 2021-01-12 DIAGNOSIS — H409 Unspecified glaucoma: Secondary | ICD-10-CM | POA: Diagnosis not present

## 2021-01-12 DIAGNOSIS — Z9181 History of falling: Secondary | ICD-10-CM | POA: Diagnosis not present

## 2021-01-12 DIAGNOSIS — D649 Anemia, unspecified: Secondary | ICD-10-CM | POA: Diagnosis not present

## 2021-01-12 DIAGNOSIS — E785 Hyperlipidemia, unspecified: Secondary | ICD-10-CM | POA: Diagnosis not present

## 2021-01-12 DIAGNOSIS — M069 Rheumatoid arthritis, unspecified: Secondary | ICD-10-CM | POA: Diagnosis not present

## 2021-01-12 DIAGNOSIS — S52611D Displaced fracture of right ulna styloid process, subsequent encounter for closed fracture with routine healing: Secondary | ICD-10-CM | POA: Diagnosis not present

## 2021-01-12 DIAGNOSIS — M0509 Felty's syndrome, multiple sites: Secondary | ICD-10-CM | POA: Diagnosis not present

## 2021-01-12 DIAGNOSIS — E119 Type 2 diabetes mellitus without complications: Secondary | ICD-10-CM | POA: Diagnosis not present

## 2021-01-12 NOTE — Telephone Encounter (Signed)
I have faxed the last 2 OV Notes as requested. Will forward note to Dr Alphonsus Sias so he is aware to be looking for the [facility]

## 2021-01-12 NOTE — Telephone Encounter (Signed)
Misty Stanley called in from [place] and wanted to know about getting the last 2 office visit notes and she is going to fax the FL2. And it can be faxed to [phone] atn: Rosalie Doctor

## 2021-01-12 NOTE — Telephone Encounter (Signed)
Noted. Glad she had no injury---hopefully her strength and balance will improve with the therapy.

## 2021-01-13 ENCOUNTER — Telehealth: Payer: Self-pay | Admitting: *Deleted

## 2021-01-13 DIAGNOSIS — Z9181 History of falling: Secondary | ICD-10-CM | POA: Diagnosis not present

## 2021-01-13 DIAGNOSIS — S52611D Displaced fracture of right ulna styloid process, subsequent encounter for closed fracture with routine healing: Secondary | ICD-10-CM | POA: Diagnosis not present

## 2021-01-13 DIAGNOSIS — H409 Unspecified glaucoma: Secondary | ICD-10-CM | POA: Diagnosis not present

## 2021-01-13 DIAGNOSIS — E119 Type 2 diabetes mellitus without complications: Secondary | ICD-10-CM | POA: Diagnosis not present

## 2021-01-13 DIAGNOSIS — E785 Hyperlipidemia, unspecified: Secondary | ICD-10-CM | POA: Diagnosis not present

## 2021-01-13 DIAGNOSIS — M069 Rheumatoid arthritis, unspecified: Secondary | ICD-10-CM | POA: Diagnosis not present

## 2021-01-13 DIAGNOSIS — M0509 Felty's syndrome, multiple sites: Secondary | ICD-10-CM | POA: Diagnosis not present

## 2021-01-13 DIAGNOSIS — Z7984 Long term (current) use of oral hypoglycemic drugs: Secondary | ICD-10-CM | POA: Diagnosis not present

## 2021-01-13 DIAGNOSIS — Z7982 Long term (current) use of aspirin: Secondary | ICD-10-CM | POA: Diagnosis not present

## 2021-01-13 DIAGNOSIS — I10 Essential (primary) hypertension: Secondary | ICD-10-CM | POA: Diagnosis not present

## 2021-01-13 DIAGNOSIS — D649 Anemia, unspecified: Secondary | ICD-10-CM | POA: Diagnosis not present

## 2021-01-13 NOTE — Telephone Encounter (Signed)
Okay for those orders

## 2021-01-13 NOTE — Telephone Encounter (Signed)
Honestly, I do not know the answer to that. Just what was in this message.

## 2021-01-13 NOTE — Telephone Encounter (Signed)
You can approve the orders

## 2021-01-13 NOTE — Telephone Encounter (Signed)
Are they giving a completed FL-2 for me to review and sign---or do I have to do the whole form (we don't have to wait for them to fax a blank FL-2)

## 2021-01-13 NOTE — Telephone Encounter (Signed)
Verbal orders left on verified VM  

## 2021-01-13 NOTE — Telephone Encounter (Signed)
[name], ST with [facility] left message with Triage asking for verbal orders. Pt is moving so they just need a verbal order for a one time a week (next week) appt for Home Health S.T. to address cognition before she moves   [name]'s CB# [phone]

## 2021-01-14 DIAGNOSIS — Z7984 Long term (current) use of oral hypoglycemic drugs: Secondary | ICD-10-CM | POA: Diagnosis not present

## 2021-01-14 DIAGNOSIS — I10 Essential (primary) hypertension: Secondary | ICD-10-CM | POA: Diagnosis not present

## 2021-01-14 DIAGNOSIS — Z9181 History of falling: Secondary | ICD-10-CM | POA: Diagnosis not present

## 2021-01-14 DIAGNOSIS — M069 Rheumatoid arthritis, unspecified: Secondary | ICD-10-CM | POA: Diagnosis not present

## 2021-01-14 DIAGNOSIS — E785 Hyperlipidemia, unspecified: Secondary | ICD-10-CM | POA: Diagnosis not present

## 2021-01-14 DIAGNOSIS — H409 Unspecified glaucoma: Secondary | ICD-10-CM | POA: Diagnosis not present

## 2021-01-14 DIAGNOSIS — E119 Type 2 diabetes mellitus without complications: Secondary | ICD-10-CM | POA: Diagnosis not present

## 2021-01-14 DIAGNOSIS — M0509 Felty's syndrome, multiple sites: Secondary | ICD-10-CM | POA: Diagnosis not present

## 2021-01-14 DIAGNOSIS — Z7982 Long term (current) use of aspirin: Secondary | ICD-10-CM | POA: Diagnosis not present

## 2021-01-14 DIAGNOSIS — S52611D Displaced fracture of right ulna styloid process, subsequent encounter for closed fracture with routine healing: Secondary | ICD-10-CM | POA: Diagnosis not present

## 2021-01-14 DIAGNOSIS — D649 Anemia, unspecified: Secondary | ICD-10-CM | POA: Diagnosis not present

## 2021-01-17 ENCOUNTER — Telehealth: Payer: Self-pay | Admitting: Internal Medicine

## 2021-01-17 ENCOUNTER — Ambulatory Visit: Payer: Medicare Other | Admitting: Internal Medicine

## 2021-01-17 DIAGNOSIS — Z9181 History of falling: Secondary | ICD-10-CM | POA: Diagnosis not present

## 2021-01-17 DIAGNOSIS — Z7984 Long term (current) use of oral hypoglycemic drugs: Secondary | ICD-10-CM | POA: Diagnosis not present

## 2021-01-17 DIAGNOSIS — E119 Type 2 diabetes mellitus without complications: Secondary | ICD-10-CM | POA: Diagnosis not present

## 2021-01-17 DIAGNOSIS — D649 Anemia, unspecified: Secondary | ICD-10-CM | POA: Diagnosis not present

## 2021-01-17 DIAGNOSIS — M069 Rheumatoid arthritis, unspecified: Secondary | ICD-10-CM | POA: Diagnosis not present

## 2021-01-17 DIAGNOSIS — H409 Unspecified glaucoma: Secondary | ICD-10-CM | POA: Diagnosis not present

## 2021-01-17 DIAGNOSIS — M0509 Felty's syndrome, multiple sites: Secondary | ICD-10-CM | POA: Diagnosis not present

## 2021-01-17 DIAGNOSIS — Z7982 Long term (current) use of aspirin: Secondary | ICD-10-CM | POA: Diagnosis not present

## 2021-01-17 DIAGNOSIS — E785 Hyperlipidemia, unspecified: Secondary | ICD-10-CM | POA: Diagnosis not present

## 2021-01-17 DIAGNOSIS — S52611D Displaced fracture of right ulna styloid process, subsequent encounter for closed fracture with routine healing: Secondary | ICD-10-CM | POA: Diagnosis not present

## 2021-01-17 DIAGNOSIS — I10 Essential (primary) hypertension: Secondary | ICD-10-CM | POA: Diagnosis not present

## 2021-01-17 NOTE — Telephone Encounter (Signed)
Spoke to pt's daughter, [name]. Pt is not going to [place]. Will be staying at home. I advised her that we will need her to do at least a virtual visit for a F2F as we have not seen her and have been receiving orders for PT/OT from [facility]. She was not aware that we were doing them. She thought it was coming from the hospital doctors. We made a virtual visit for 01-19-21 for a F2F and get her set up with Home PT/OT in our area.

## 2021-01-17 NOTE — Telephone Encounter (Signed)
Ms. Deretha Emory called in wanted to know about having Diana Roberson call her due to Ms. Selden has been staying with Ms. Chambers for the past 3 weeks and she was seen at a hospital in [facility] and they started PT, OT and they are recommending to continuing the therpies for when she returns home. And they are needing a referral for both PT AND OT.   Please advise

## 2021-01-18 ENCOUNTER — Telehealth: Payer: Self-pay

## 2021-01-18 DIAGNOSIS — M069 Rheumatoid arthritis, unspecified: Secondary | ICD-10-CM | POA: Diagnosis not present

## 2021-01-18 DIAGNOSIS — E119 Type 2 diabetes mellitus without complications: Secondary | ICD-10-CM | POA: Diagnosis not present

## 2021-01-18 DIAGNOSIS — D649 Anemia, unspecified: Secondary | ICD-10-CM | POA: Diagnosis not present

## 2021-01-18 DIAGNOSIS — Z7984 Long term (current) use of oral hypoglycemic drugs: Secondary | ICD-10-CM | POA: Diagnosis not present

## 2021-01-18 DIAGNOSIS — Z9181 History of falling: Secondary | ICD-10-CM | POA: Diagnosis not present

## 2021-01-18 DIAGNOSIS — S52611D Displaced fracture of right ulna styloid process, subsequent encounter for closed fracture with routine healing: Secondary | ICD-10-CM | POA: Diagnosis not present

## 2021-01-18 DIAGNOSIS — I10 Essential (primary) hypertension: Secondary | ICD-10-CM | POA: Diagnosis not present

## 2021-01-18 DIAGNOSIS — E785 Hyperlipidemia, unspecified: Secondary | ICD-10-CM | POA: Diagnosis not present

## 2021-01-18 DIAGNOSIS — M0509 Felty's syndrome, multiple sites: Secondary | ICD-10-CM | POA: Diagnosis not present

## 2021-01-18 DIAGNOSIS — Z7982 Long term (current) use of aspirin: Secondary | ICD-10-CM | POA: Diagnosis not present

## 2021-01-18 DIAGNOSIS — H409 Unspecified glaucoma: Secondary | ICD-10-CM | POA: Diagnosis not present

## 2021-01-18 NOTE — Chronic Care Management (AMB) (Addendum)
Chronic Care Management Pharmacy Assistant   Name: Diana Roberson  MRN: [id number] DOB: 1945/09/17  Reason for Encounter: Disease State- Diabetes  Recent office visits:  None since last CCM Contact  Recent consult visits:  01/01/21 - 01/18/21- Home Health PT/OT  Hospital visits:  ED Visit- 12/26/20- Confusion ED visit 12/25/20- Anxiety  Medications: Outpatient Encounter Medications as of 01/18/2021  Medication Sig Note   aspirin EC 81 MG tablet Take 81 mg by mouth daily.    atorvastatin (LIPITOR) 20 MG tablet TAKE 1 TABLET BY MOUTH  DAILY (Patient taking differently: Take 20 mg by mouth daily.)    brimonidine (ALPHAGAN) 0.2 % ophthalmic solution Place 1 drop into both eyes 2 (two) times daily.    calcium gluconate 500 MG tablet Take 1 tablet by mouth 2 (two) times daily.    cholecalciferol (VITAMIN D) 1000 UNITS tablet Take 1,000 Units by mouth daily.    dorzolamide-timolol (COSOPT) 22.3-6.8 MG/ML ophthalmic solution Place 1 drop into both eyes 2 (two) times daily.    famotidine (PEPCID) 20 MG tablet Take 1 tablet (20 mg total) by mouth daily. In am    folic acid (FOLVITE) 1 MG tablet Take 1 tablet (1 mg total) by mouth daily.    gabapentin (NEURONTIN) 300 MG capsule Take 300 mg by mouth at bedtime.    glucose blood (ONETOUCH VERIO) test strip Use to check blood sugar once a day. Dx Code E11.9    hydroxychloroquine (PLAQUENIL) 200 MG tablet Take 200 mg by mouth daily.    lisinopril (ZESTRIL) 10 MG tablet Take 1 tablet (10 mg total) by mouth daily.    metFORMIN (GLUCOPHAGE) 500 MG tablet Take 2 tablets (1,000 mg total) by mouth 2 (two) times daily with a meal.    methotrexate (RHEUMATREX) 2.5 MG tablet Take 7 tablets (17.5 mg total) by mouth once a week. Caution:Chemotherapy. Protect from light. 12/26/2020: Unable to verify what day of the week at this time   mirabegron ER (MYRBETRIQ) 25 MG TB24 tablet Take 1 tablet (25 mg total) by mouth daily.    OneTouch Delica Lancets 33G MISC 1  each by Does not apply route daily. Dx Code E11.9    QUEtiapine (SEROQUEL) 25 MG tablet Take 0.5-1 tablets (12.5-25 mg total) by mouth at bedtime.    vitamin B-12 (CYANOCOBALAMIN) 1000 MCG tablet Take 1,000 mcg by mouth daily.    vitamin E 400 UNIT capsule Take 400 Units by mouth daily.    ziprasidone (GEODON) 60 MG capsule Take 1 capsule (60 mg total) by mouth at bedtime.    No facility-administered encounter medications on file as of 01/18/2021.     Recent Relevant Labs: Lab Results  Component Value Date/Time   HGBA1C 6.2 (H) 11/13/2020 11:59 PM   HGBA1C 7.7 (A) 06/16/2020 11:13 AM   HGBA1C 6.9 (A) 01/15/2020 02:35 PM   HGBA1C 7.2 (H) 01/14/2019 02:26 PM   MICROALBUR 1.7 04/30/2015 12:43 PM   MICROALBUR 2.1 (H) 06/09/2013 12:54 PM    Kidney Function Lab Results  Component Value Date/Time   CREATININE 0.88 12/25/2020 10:31 PM   CREATININE 0.72 11/14/2020 04:12 AM   GFR 67.03 07/19/2020 04:38 PM   GFRNONAA >60 12/25/2020 10:31 PM   GFRAA 77 01/17/2016 03:45 PM     Current antihyperglycemic regimen:  Metformin 500 mg - 2 tablets BID with meals   Patient verbally confirms she is taking the above medications as directed. Yes  What recent interventions/DTPs have been made to improve glycemic  control:  No recent interventions for diabetes  Have there been any recent hospitalizations or ED visits since last visit with CPP? Yes  Patient denies hypoglycemic symptoms, including Pale, Sweaty, Shaky, Hungry, Nervous/irritable and Vision changes  Patient denies hyperglycemic symptoms, including blurry vision, excessive thirst, fatigue, polyuria and weakness  How often are you checking your blood sugar? once daily  What are your blood sugars ranging?  Fasting: 94, 140 Before meals: N/A After meals: 143 Bedtime: N/A   On insulin? No  During the week, how often does your blood glucose drop below 70? Never  Are you checking your feet daily/regularly? Yes  Adherence  Review: Is the patient currently on a STATIN medication? Yes Is the patient currently on ACE/ARB medication? Yes Does the patient have >5 day gap between last estimated fill dates? CPP to review Metformin  Star Rating Drugs:  Medication:  Last Fill: Day Supply Atorvastatin 20 mg 11/16/20  90 Lisinopril 10 mg 11/08/20 90 Metformin 500 mg 09/29/20 90  Spoke with patients daughter [name], patient is doing well. Patients daughter states the hospitalist decreased Geodon and it seems to have helped her with her confusion and unsteadiness. She is currently taking 20 mg in the morning and 20 mg in the evening. She would like to continue to titrate down if possible. She is set up with a new psychiatrist and will see them tomorrow afternoon.    Follow-Up:  Pharmacist Review  Phil Dopp, CPP notified  Jomarie Longs, [facility] Clinical Pharmacy Assistant [phone]  I have reviewed the care management and care coordination activities outlined in this encounter and I am certifying that I agree with the content of this note. No further action required.  Phil Dopp, PharmD Clinical Pharmacist [facility] [phone]

## 2021-01-19 ENCOUNTER — Telehealth (INDEPENDENT_AMBULATORY_CARE_PROVIDER_SITE_OTHER): Payer: Medicare Other | Admitting: Internal Medicine

## 2021-01-19 ENCOUNTER — Other Ambulatory Visit: Payer: Self-pay

## 2021-01-19 ENCOUNTER — Encounter: Payer: Self-pay | Admitting: Internal Medicine

## 2021-01-19 ENCOUNTER — Telehealth: Payer: Self-pay | Admitting: Internal Medicine

## 2021-01-19 DIAGNOSIS — R4182 Altered mental status, unspecified: Secondary | ICD-10-CM

## 2021-01-19 DIAGNOSIS — F3174 Bipolar disorder, in full remission, most recent episode manic: Secondary | ICD-10-CM

## 2021-01-19 DIAGNOSIS — I10 Essential (primary) hypertension: Secondary | ICD-10-CM | POA: Diagnosis not present

## 2021-01-19 DIAGNOSIS — E1159 Type 2 diabetes mellitus with other circulatory complications: Secondary | ICD-10-CM

## 2021-01-19 MED ORDER — ZIPRASIDONE HCL 20 MG PO CAPS: 20.0000 mg | ORAL_CAPSULE | Freq: Two times a day (BID) | ORAL | 2 refills | Status: AC

## 2021-01-19 NOTE — Assessment & Plan Note (Signed)
Apparently having extrapyramidal side effects with the 80 daily of geodon Now doing better on 20 bid Still on low dose quetiapine at bedtime Doing virtual visit with psychiatrist today--hospital follow up

## 2021-01-19 NOTE — Telephone Encounter (Signed)
MRS. KIM CALLED IN AND WANTED TO HAVE SHANNON CALL HER. HER MOM WAS GIVING GEODON AND WANTED TO GET A LOWER DOSAGE OF 20MG  X2 A DAY AND SHE IS DOING GREAT ON IT.  AND WANTED TO KNOW IF THEY CAN CALL IT IN TODAY. AND IT IS TO [facility] TO [facility] [address] [phone])

## 2021-01-19 NOTE — Telephone Encounter (Signed)
Spoke to Dr Alphonsus Sias through encrypted email. Dr Alphonsus Sias agreed. Called Kim and advised her I sent it in to [facility] for #60 and 2 refills.

## 2021-01-19 NOTE — Assessment & Plan Note (Signed)
With weakness, etc Has improved with cognitive therapy, PT, OT Able to move back into her home with daily help Needs to continue therapy due to ongoing weakness

## 2021-01-19 NOTE — Assessment & Plan Note (Signed)
Lab Results  Component Value Date   HGBA1C 6.2 (H) 11/13/2020   No problems with sugars on metformin Will confirm ongoing control at follow up

## 2021-01-19 NOTE — Assessment & Plan Note (Signed)
Has been okay on the lisinopril Will recheck in person in about a month

## 2021-01-19 NOTE — Progress Notes (Signed)
Subjective:    Patient ID: Diana Roberson, female    DOB: 07-Oct-1945, 75 y.o.   MRN: [id number]  HPI Video virtual visit for face to face review for home care service Identification done Reviewed limitations and billing and she gave consent Participants ---patient and her daughter-Kimberly at her home  and I am in my office  Daughter helps with most of the history Had been having more trouble even before husband died More foot shuffling and "the fidgets" Sleeping till 11-noon and groggy Some trouble with mouth not moving right Concerned about the antipsychotics  Finally wound up in ER in April "Absolutely zero help" They took her to [facility]) Was admitted---seen by psychiatrist Sent home with PT, speech, OT at daughter's home  Walking better now Speech enunciation and cognition have improved Getting up at 8AM Doing "loads better"  Did get psychiatry appointment --doing virtual today He is in [place]--may need to have me take over the care again  No mania on the lower geodon dose  Current Outpatient Medications on File Prior to Visit  Medication Sig Dispense Refill  . aspirin EC 81 MG tablet Take 81 mg by mouth daily.    . brimonidine (ALPHAGAN) 0.2 % ophthalmic solution Place 1 drop into both eyes 2 (two) times daily.    . cholecalciferol (VITAMIN D) 1000 UNITS tablet Take 1,000 Units by mouth daily.    . dorzolamide-timolol (COSOPT) 22.3-6.8 MG/ML ophthalmic solution Place 1 drop into both eyes 2 (two) times daily.    . folic acid (FOLVITE) 1 MG tablet Take 1 tablet (1 mg total) by mouth daily. 90 tablet 3  . glucose blood (ONETOUCH VERIO) test strip Use to check blood sugar once a day. Dx Code E11.9 100 each 3  . hydroxychloroquine (PLAQUENIL) 200 MG tablet Take 200 mg by mouth daily.    Marland Kitchen lisinopril (ZESTRIL) 10 MG tablet Take 1 tablet (10 mg total) by mouth daily. 90 tablet 3  . metFORMIN (GLUCOPHAGE) 500 MG tablet Take 2 tablets (1,000 mg total) by mouth 2 (two)  times daily with a meal. 360 tablet 3  . methotrexate (RHEUMATREX) 2.5 MG tablet Take 7 tablets (17.5 mg total) by mouth once a week. Caution:Chemotherapy. Protect from light. 91 tablet 3  . OneTouch Delica Lancets 33G MISC 1 each by Does not apply route daily. Dx Code E11.9 100 each 3  . QUEtiapine (SEROQUEL) 25 MG tablet Take 0.5-1 tablets (12.5-25 mg total) by mouth at bedtime. 30 tablet 3  . vitamin B-12 (CYANOCOBALAMIN) 1000 MCG tablet Take 1,000 mcg by mouth daily.    . vitamin E 400 UNIT capsule Take 400 Units by mouth daily.    . ziprasidone (GEODON) 20 MG capsule Take 1 capsule by mouth in the morning and at bedtime.     No current facility-administered medications on file prior to visit.    Allergies  Allergen Reactions  . Codeine Sulfate Other (See Comments)    Indigestion and epigastric pain  . Sertraline Hcl Other (See Comments)    REACTION: Worse, ? irritable    Past Medical History:  Diagnosis Date  . Allergy   . Bipolar 1 disorder (HCC)   . Chronic back pain   . Diabetes mellitus   . Difficult intubation   . Diverticulitis   . GERD (gastroesophageal reflux disease)   . Glaucoma   . Hyperlipidemia   . Hypertension   . Mental health disorder    admitted x3  . Osteopenia   .  Rheumatoid arthritis [facility])     Past Surgical History:  Procedure Laterality Date  . APPENDECTOMY    . BACK SURGERY  1988  . BREAST CYST ASPIRATION     neg  . CHOLECYSTECTOMY    . COLONOSCOPY    . COLONOSCOPY WITH PROPOFOL N/A 11/27/2017   Procedure: COLONOSCOPY WITH PROPOFOL;  Surgeon: Christena Deem, MD;  Location: [facility] ENDOSCOPY;  Service: Endoscopy;  Laterality: N/A;  . HAND SURGERY    . ORIF ELBOW FRACTURE Right 06/24/2019   Procedure: OPEN REDUCTION INTERNAL FIXATION (ORIF) ELBOW/OLECRANON FRACTURE;  Surgeon: Christena Flake, MD;  Location: [facility] ORS;  Service: Orthopedics;  Laterality: Right;    Family History  Problem Relation Age of Onset  . Diabetes Mother   .  Hypertension Mother   . Stroke Sister   . Hashimoto's thyroiditis Daughter   . Stroke Father   . Stroke Sister   . Cancer Neg Hx   . Breast cancer Neg Hx     Social History   Socioeconomic History  . Marital status: Widowed    Spouse name: Not on file  . Number of children: 2  . Years of education: Not on file  . Highest education level: Not on file  Occupational History  . Occupation: formerly Designer, jewellery for [employer]  . Smoking status: Never Smoker  . Smokeless tobacco: Never Used  Vaping Use  . Vaping Use: Never used  Substance and Sexual Activity  . Alcohol use: No  . Drug use: No  . Sexual activity: Not on file  Other Topics Concern  . Not on file  Social History Narrative   Widowed 3/22   Has living will       Would accept resuscitation attempts   Would not want feeding tube if cognitively unaware   Social Determinants of Health   Financial Resource Strain: Low Risk   . Difficulty of Paying Living Expenses: Not very hard  Food Insecurity: Not on file  Transportation Needs: Not on file  Physical Activity: Not on file  Stress: Not on file  Social Connections: Not on file  Intimate Partner Violence: Not on file   Review of Systems  Will have daughter in morning Other daughter will come for a couple of days a week Aide in evening to ensure proper meds, dinner They are working on Scientist, clinical (histocompatibility and immunogenetics) house for safety (shower bars, etc) Going on the waiting list--at [facility]     Objective:   Physical Exam Constitutional:      Comments: Sitting in chair  Neurological:     Mental Status: She is alert.  Psychiatric:     Comments: Somewhat flat but smiles Clearly not manic Not depressed            Assessment & Plan:

## 2021-01-26 ENCOUNTER — Telehealth: Payer: Self-pay | Admitting: Internal Medicine

## 2021-01-26 NOTE — Telephone Encounter (Signed)
Called patient's daughter to schedule a 1 mo follow up in office around 6/11. Daughter stated she will call back.

## 2021-01-26 NOTE — Telephone Encounter (Signed)
Alphonzo Grieve (daughter) came in office to drop off a day program medical information form for pt. [name] said she need form back ASAP or Friday at the latest 5/20. [name] also said she need medical  list for pt. [name] said to call when form is done and she will have her husband to come pick it up [phone]. [name] stated that she need everything that is highlighted (front and back)  filled out but for the TB test portion she will take pt to health dept to get it done, but she does need the last time she was seen here. Form has been put in the pt folder.

## 2021-01-27 NOTE — Telephone Encounter (Signed)
Advised daughter that form was up front ready for pickup and there is a $29 charge.

## 2021-01-27 NOTE — Telephone Encounter (Signed)
Form done 

## 2021-01-27 NOTE — Telephone Encounter (Signed)
Dr Alphonsus Sias does not come in until 12:30/1pm today. Form placed in his inbox on his desk.

## 2021-01-28 ENCOUNTER — Other Ambulatory Visit: Payer: Self-pay

## 2021-01-28 ENCOUNTER — Ambulatory Visit (LOCAL_COMMUNITY_HEALTH_CENTER): Payer: Medicare Other

## 2021-01-28 DIAGNOSIS — Z111 Encounter for screening for respiratory tuberculosis: Secondary | ICD-10-CM

## 2021-01-31 ENCOUNTER — Ambulatory Visit (LOCAL_COMMUNITY_HEALTH_CENTER): Payer: Medicare Other

## 2021-01-31 ENCOUNTER — Other Ambulatory Visit: Payer: Self-pay

## 2021-01-31 DIAGNOSIS — Z111 Encounter for screening for respiratory tuberculosis: Secondary | ICD-10-CM

## 2021-01-31 LAB — TB SKIN TEST
Induration: 0 mm
TB Skin Test: NEGATIVE

## 2021-02-09 ENCOUNTER — Other Ambulatory Visit: Payer: Self-pay | Admitting: Internal Medicine

## 2021-02-09 MED ORDER — QUETIAPINE FUMARATE 25 MG PO TABS: 12.5000 mg | ORAL_TABLET | Freq: Every day | ORAL | 3 refills | Status: DC

## 2021-02-09 NOTE — Telephone Encounter (Signed)
Spoke to pt's daughter [name]. Advised her that the Geodon was sent to [facility] in [place] last month with 2 refills. She will check with them and she what is going on. Still needs refill of Seroquel sent in. Pt is with other daughter in [place] right now.

## 2021-02-09 NOTE — Telephone Encounter (Signed)
  LAST APPOINTMENT DATE: 01/26/2021   NEXT APPOINTMENT DATE:@6 /13/2022  MEDICATION: Geodon 20 mg capsule / seroquel 25 mg   PHARMACY: [facility] [address] [place], [place] [address] PATIENT IS OUT OF MEDICATION. DAUGHTER IS NOT ON DPR.    Let patient know to contact pharmacy at the end of the day to make sure medication is ready.  Please notify patient to allow 48-72 hours to process  Encourage patient to contact the pharmacy for refills or they can request refills through [facility]  CLINICAL FILLS OUT ALL BELOW:   LAST REFILL:  QTY:  REFILL DATE:    OTHER COMMENTS:    Okay for refill?  Please advise

## 2021-02-09 NOTE — Telephone Encounter (Signed)
No INFO WAS PROVIDED TO DAUGHTER. FORWARDED REQUEST DUE TO CMA WORKING WITH THE DAUGHTER OF THE PATIENT IN THE PAST ACCORDING TO HER.  EM

## 2021-02-21 ENCOUNTER — Ambulatory Visit (INDEPENDENT_AMBULATORY_CARE_PROVIDER_SITE_OTHER): Payer: Medicare Other | Admitting: Internal Medicine

## 2021-02-21 ENCOUNTER — Other Ambulatory Visit: Payer: Self-pay

## 2021-02-21 ENCOUNTER — Encounter: Payer: Self-pay | Admitting: Internal Medicine

## 2021-02-21 DIAGNOSIS — M059 Rheumatoid arthritis with rheumatoid factor, unspecified: Secondary | ICD-10-CM

## 2021-02-21 DIAGNOSIS — E1159 Type 2 diabetes mellitus with other circulatory complications: Secondary | ICD-10-CM

## 2021-02-21 DIAGNOSIS — F3174 Bipolar disorder, in full remission, most recent episode manic: Secondary | ICD-10-CM | POA: Diagnosis not present

## 2021-02-21 LAB — POCT GLYCOSYLATED HEMOGLOBIN (HGB A1C): Hemoglobin A1C: 5.7 % — AB (ref 4.0–5.6)

## 2021-02-21 NOTE — Assessment & Plan Note (Signed)
Doing well now on lower dose geodon Quetiapine at bedtime Will continue these doses Has help at home--and doing okay again ([facility] for days)

## 2021-02-21 NOTE — Progress Notes (Signed)
Subjective:    Patient ID: Diana Roberson, female    DOB: [birth year], 75 y.o.   MRN: [id number]  HPI Here with daughter for follow up of bipolar disorder and other chronic health conditions With daughter This visit occurred during the SARS-CoV-2 public health emergency.  Safety protocols were in place, including screening questions prior to the visit, additional usage of staff PPE, and extensive cleaning of exam room while observing appropriate contact time as indicated for disinfecting solutions. '  Has been back in her place since 3 weeks after the hospitalization Never had OT or PT Back to doing most of her personal care---needs help with shower, eye drops Daughter comes in the morning---but she has usually already had breakfast Goes to [facility] daily 10AM -4PM Daughter comes back or aide (or other daughter) in the afternoon (mostly to help with eye drops again) Has aides to help on weekends as well (all day and night for weekends) Back out ---like to church  Extrapyramidal symptoms gone---no more shuffling gait, almost "catatonia" gone, etc No mania  Hasn't been checking sugars Always low  Wasn't able to connect with psychiatrist on virtual visit  Current Outpatient Medications on File Prior to Visit  Medication Sig Dispense Refill   aspirin EC 81 MG tablet Take 81 mg by mouth daily.     brimonidine (ALPHAGAN) 0.2 % ophthalmic solution Place 1 drop into both eyes 2 (two) times daily.     cholecalciferol (VITAMIN D) 1000 UNITS tablet Take 1,000 Units by mouth daily.     dorzolamide-timolol (COSOPT) 22.3-6.8 MG/ML ophthalmic solution Place 1 drop into both eyes 2 (two) times daily.     folic acid (FOLVITE) 1 MG tablet Take 1 tablet (1 mg total) by mouth daily. 90 tablet 3   glucose blood (ONETOUCH VERIO) test strip Use to check blood sugar once a day. Dx Code E11.9 100 each 3   hydroxychloroquine (PLAQUENIL) 200 MG tablet Take 200 mg by mouth daily.     lisinopril  (ZESTRIL) 10 MG tablet Take 1 tablet (10 mg total) by mouth daily. 90 tablet 3   metFORMIN (GLUCOPHAGE) 500 MG tablet Take 2 tablets (1,000 mg total) by mouth 2 (two) times daily with a meal. 360 tablet 3   methotrexate (RHEUMATREX) 2.5 MG tablet Take 7 tablets (17.5 mg total) by mouth once a week. Caution:Chemotherapy. Protect from light. 91 tablet 3   OneTouch Delica Lancets 33G MISC 1 each by Does not apply route daily. Dx Code E11.9 100 each 3   QUEtiapine (SEROQUEL) 25 MG tablet Take 0.5-1 tablets (12.5-25 mg total) by mouth at bedtime. 30 tablet 3   vitamin B-12 (CYANOCOBALAMIN) 1000 MCG tablet Take 1,000 mcg by mouth daily.     vitamin E 400 UNIT capsule Take 400 Units by mouth daily.     ziprasidone (GEODON) 20 MG capsule Take 1 capsule (20 mg total) by mouth in the morning and at bedtime. 60 capsule 2   No current facility-administered medications on file prior to visit.    Allergies  Allergen Reactions   Codeine Sulfate Other (See Comments)    Indigestion and epigastric pain   Sertraline Hcl Other (See Comments)    REACTION: Worse, ? irritable    Past Medical History:  Diagnosis Date   Allergy    Bipolar 1 disorder (HCC)    Chronic back pain    Diabetes mellitus    Difficult intubation    Diverticulitis    GERD (gastroesophageal reflux disease)  Glaucoma    Hyperlipidemia    Hypertension    Mental health disorder    admitted x3   Osteopenia    Rheumatoid arthritis (HCC)     Past Surgical History:  Procedure Laterality Date   APPENDECTOMY     BACK SURGERY  1988   BREAST CYST ASPIRATION     neg   CHOLECYSTECTOMY     COLONOSCOPY     COLONOSCOPY WITH PROPOFOL N/A 11/27/2017   Procedure: COLONOSCOPY WITH PROPOFOL;  Surgeon: Christena Deem, MD;  Location: [facility] ENDOSCOPY;  Service: Endoscopy;  Laterality: N/A;   HAND SURGERY     ORIF ELBOW FRACTURE Right 06/24/2019   Procedure: OPEN REDUCTION INTERNAL FIXATION (ORIF) ELBOW/OLECRANON FRACTURE;  Surgeon: Christena Flake, MD;  Location: [facility] ORS;  Service: Orthopedics;  Laterality: Right;    Family History  Problem Relation Age of Onset   Diabetes Mother    Hypertension Mother    Stroke Sister    Hashimoto's thyroiditis Daughter    Stroke Father    Stroke Sister    Cancer Neg Hx    Breast cancer Neg Hx     Social History   Socioeconomic History   Marital status: Widowed    Spouse name: Not on file   Number of children: 2   Years of education: Not on file   Highest education level: Not on file  Occupational History   Occupation: formerly Designer, jewellery for [employer]  Tobacco Use   Smoking status: Never   Smokeless tobacco: Never  Vaping Use   Vaping Use: Never used  Substance and Sexual Activity   Alcohol use: No   Drug use: No   Sexual activity: Not on file  Other Topics Concern   Not on file  Social History Narrative   Widowed 3/22   Has living will   Daughter Cala Bradford should be health care POA   Would accept resuscitation attempts   Would not want feeding tube if cognitively unaware   Social Determinants of Health   Financial Resource Strain: Low Risk    Difficulty of Paying Living Expenses: Not very hard  Food Insecurity: Not on file  Transportation Needs: Not on file  Physical Activity: Not on file  Stress: Not on file  Social Connections: Not on file  Intimate Partner Violence: Not on file   Review of Systems Didn't do well with the lower quetiapine on the lower geodon--back on 25mg   Sleeping okay Burping a lot---some diarrhea recently (with occasional incontinence) No chest pain or SOB    Objective:   Physical Exam Constitutional:      Appearance: Normal appearance.  Cardiovascular:     Rate and Rhythm: Normal rate and regular rhythm.     Heart sounds: No murmur heard.   No gallop.  Pulmonary:     Effort: Pulmonary effort is normal.     Breath sounds: Normal breath sounds. No wheezing or rales.  Abdominal:     Palpations: Abdomen is  soft.     Tenderness: There is no abdominal tenderness.  Musculoskeletal:     Right lower leg: No edema.     Left lower leg: No edema.  Skin:    Findings: No rash.  Neurological:     General: No focal deficit present.     Mental Status: She is alert.  Psychiatric:     Comments: Calm and interacts normally           Assessment & Plan:

## 2021-02-21 NOTE — Addendum Note (Signed)
Addended by: Eual Fines on: 02/21/2021 02:53 PM   Modules accepted: Orders

## 2021-02-21 NOTE — Assessment & Plan Note (Signed)
Doing okay on MTX and hydroxychloroquine Seeing rheumatologist again soon

## 2021-02-21 NOTE — Patient Instructions (Signed)
Please stop the metformin. Check your sugar every 1-2 weeks and let me know if they are consistently over 160 fasting.

## 2021-02-21 NOTE — Assessment & Plan Note (Signed)
Lab Results  Component Value Date   HGBA1C 6.2 (H) 11/13/2020   A1c today 5.7% Seems to be having side effects from metformin---will try without it

## 2021-02-22 ENCOUNTER — Telehealth: Payer: Self-pay

## 2021-02-22 NOTE — Telephone Encounter (Signed)
[name], patient's daughter, called asking to have Seroquel re sent to [facility] instead of [facility]-it will be free with mail order. Also states patient is taking 25 mg 1 tablet daily and this is working well.  Thank you

## 2021-02-23 MED ORDER — QUETIAPINE FUMARATE 25 MG PO TABS: 12.5000 mg | ORAL_TABLET | Freq: Every day | ORAL | 3 refills | Status: AC

## 2021-02-23 NOTE — Telephone Encounter (Signed)
Okay to send 1 year supply at 25mg  at bedtime daily

## 2021-02-23 NOTE — Telephone Encounter (Signed)
Sent rx to [facility]. Left message for [name] to let her know. Advised her we have been seeing issues with [facility] accepting our medications since the Epic upgrade last week. Advised her to let us know if they have trouble getting the medication.

## 2021-02-26 ENCOUNTER — Observation Stay: Payer: Medicare Other

## 2021-02-26 ENCOUNTER — Emergency Department: Payer: Medicare Other

## 2021-02-26 ENCOUNTER — Encounter: Payer: Self-pay | Admitting: Emergency Medicine

## 2021-02-26 ENCOUNTER — Observation Stay
Admission: EM | Admit: 2021-02-26 | Discharge: 2021-02-27 | Disposition: A | Discharge status: Home / Self Care | Payer: Medicare Other | Attending: Internal Medicine | Admitting: Internal Medicine

## 2021-02-26 ENCOUNTER — Other Ambulatory Visit: Payer: Self-pay

## 2021-02-26 DIAGNOSIS — R531 Weakness: Secondary | ICD-10-CM

## 2021-02-26 DIAGNOSIS — S43004A Unspecified dislocation of right shoulder joint, initial encounter: Secondary | ICD-10-CM | POA: Diagnosis not present

## 2021-02-26 DIAGNOSIS — Z7982 Long term (current) use of aspirin: Secondary | ICD-10-CM | POA: Insufficient documentation

## 2021-02-26 DIAGNOSIS — Z043 Encounter for examination and observation following other accident: Secondary | ICD-10-CM | POA: Diagnosis not present

## 2021-02-26 DIAGNOSIS — M059 Rheumatoid arthritis with rheumatoid factor, unspecified: Secondary | ICD-10-CM | POA: Diagnosis not present

## 2021-02-26 DIAGNOSIS — Z28310 Unvaccinated for covid-19: Secondary | ICD-10-CM | POA: Insufficient documentation

## 2021-02-26 DIAGNOSIS — Z8616 Personal history of COVID-19: Secondary | ICD-10-CM | POA: Insufficient documentation

## 2021-02-26 DIAGNOSIS — K219 Gastro-esophageal reflux disease without esophagitis: Secondary | ICD-10-CM | POA: Diagnosis present

## 2021-02-26 DIAGNOSIS — S43005A Unspecified dislocation of left shoulder joint, initial encounter: Secondary | ICD-10-CM | POA: Insufficient documentation

## 2021-02-26 DIAGNOSIS — S43015A Anterior dislocation of left humerus, initial encounter: Secondary | ICD-10-CM

## 2021-02-26 DIAGNOSIS — Z743 Need for continuous supervision: Secondary | ICD-10-CM | POA: Diagnosis not present

## 2021-02-26 DIAGNOSIS — R9082 White matter disease, unspecified: Secondary | ICD-10-CM | POA: Diagnosis not present

## 2021-02-26 DIAGNOSIS — E782 Mixed hyperlipidemia: Secondary | ICD-10-CM | POA: Diagnosis not present

## 2021-02-26 DIAGNOSIS — I10 Essential (primary) hypertension: Secondary | ICD-10-CM | POA: Diagnosis not present

## 2021-02-26 DIAGNOSIS — I6523 Occlusion and stenosis of bilateral carotid arteries: Secondary | ICD-10-CM | POA: Diagnosis not present

## 2021-02-26 DIAGNOSIS — Y9301 Activity, walking, marching and hiking: Secondary | ICD-10-CM | POA: Diagnosis not present

## 2021-02-26 DIAGNOSIS — R55 Syncope and collapse: Secondary | ICD-10-CM | POA: Diagnosis not present

## 2021-02-26 DIAGNOSIS — E785 Hyperlipidemia, unspecified: Secondary | ICD-10-CM | POA: Diagnosis present

## 2021-02-26 DIAGNOSIS — Z9889 Other specified postprocedural states: Secondary | ICD-10-CM | POA: Diagnosis not present

## 2021-02-26 DIAGNOSIS — F3174 Bipolar disorder, in full remission, most recent episode manic: Secondary | ICD-10-CM | POA: Diagnosis present

## 2021-02-26 DIAGNOSIS — S43014A Anterior dislocation of right humerus, initial encounter: Secondary | ICD-10-CM

## 2021-02-26 DIAGNOSIS — Z20822 Contact with and (suspected) exposure to covid-19: Secondary | ICD-10-CM | POA: Insufficient documentation

## 2021-02-26 DIAGNOSIS — R6889 Other general symptoms and signs: Secondary | ICD-10-CM | POA: Diagnosis not present

## 2021-02-26 DIAGNOSIS — W19XXXA Unspecified fall, initial encounter: Secondary | ICD-10-CM | POA: Insufficient documentation

## 2021-02-26 DIAGNOSIS — Z79899 Other long term (current) drug therapy: Secondary | ICD-10-CM | POA: Insufficient documentation

## 2021-02-26 DIAGNOSIS — E119 Type 2 diabetes mellitus without complications: Secondary | ICD-10-CM | POA: Insufficient documentation

## 2021-02-26 DIAGNOSIS — I700 Atherosclerosis of aorta: Secondary | ICD-10-CM | POA: Diagnosis not present

## 2021-02-26 DIAGNOSIS — M25519 Pain in unspecified shoulder: Secondary | ICD-10-CM | POA: Diagnosis not present

## 2021-02-26 DIAGNOSIS — S4991XA Unspecified injury of right shoulder and upper arm, initial encounter: Secondary | ICD-10-CM | POA: Diagnosis present

## 2021-02-26 DIAGNOSIS — Q7192 Unspecified reduction defect of left upper limb: Secondary | ICD-10-CM

## 2021-02-26 LAB — URINALYSIS, COMPLETE (UACMP) WITH MICROSCOPIC
Bacteria, UA: NONE SEEN
Bilirubin Urine: NEGATIVE
Glucose, UA: 150 mg/dL — AB
Hgb urine dipstick: NEGATIVE
Ketones, ur: NEGATIVE mg/dL
Leukocytes,Ua: NEGATIVE
Nitrite: NEGATIVE
Protein, ur: 100 mg/dL — AB
Specific Gravity, Urine: 1.014 (ref 1.005–1.030)
pH: 6 (ref 5.0–8.0)

## 2021-02-26 LAB — COMPREHENSIVE METABOLIC PANEL
ALT: 24 U/L (ref 0–44)
AST: 33 U/L (ref 15–41)
Albumin: 4.2 g/dL (ref 3.5–5.0)
Alkaline Phosphatase: 89 U/L (ref 38–126)
Anion gap: 10 (ref 5–15)
BUN: 29 mg/dL — ABNORMAL HIGH (ref 8–23)
CO2: 20 mmol/L — ABNORMAL LOW (ref 22–32)
Calcium: 9.5 mg/dL (ref 8.9–10.3)
Chloride: 103 mmol/L (ref 98–111)
Creatinine, Ser: 0.97 mg/dL (ref 0.44–1.00)
GFR, Estimated: >60 mL/min (ref >60)
Glucose, Bld: 249 mg/dL — ABNORMAL HIGH (ref 70–99)
Potassium: 3.9 mmol/L (ref 3.5–5.1)
Sodium: 133 mmol/L — ABNORMAL LOW (ref 135–145)
Total Bilirubin: 1 mg/dL (ref 0.3–1.2)
Total Protein: 7.6 g/dL (ref 6.5–8.1)

## 2021-02-26 LAB — CBC WITH DIFFERENTIAL/PLATELET
Abs Immature Granulocytes: 0.04 10*3/uL (ref 0.00–0.07)
Basophils Absolute: 0 10*3/uL (ref 0.0–0.1)
Basophils Relative: 0 %
Eosinophils Absolute: 0.1 10*3/uL (ref 0.0–0.5)
Eosinophils Relative: 1 %
HCT: 35.3 % — ABNORMAL LOW (ref 36.0–46.0)
Hemoglobin: 12 g/dL (ref 12.0–15.0)
Immature Granulocytes: 0 %
Lymphocytes Relative: 12 %
Lymphs Abs: 1.7 10*3/uL (ref 0.7–4.0)
MCH: 33.1 pg (ref 26.0–34.0)
MCHC: 34 g/dL (ref 30.0–36.0)
MCV: 97.5 fL (ref 80.0–100.0)
Monocytes Absolute: 1 10*3/uL (ref 0.1–1.0)
Monocytes Relative: 7 %
Neutro Abs: 11.2 10*3/uL — ABNORMAL HIGH (ref 1.7–7.7)
Neutrophils Relative %: 80 %
Platelets: 311 10*3/uL (ref 150–400)
RBC: 3.62 MIL/uL — ABNORMAL LOW (ref 3.87–5.11)
RDW: 15.2 % (ref 11.5–15.5)
WBC: 14 10*3/uL — ABNORMAL HIGH (ref 4.0–10.5)
nRBC: 0 % (ref 0.0–0.2)

## 2021-02-26 LAB — RESP PANEL BY RT-PCR (FLU A&B, COVID) ARPGX2
Influenza A by PCR: NEGATIVE
Influenza B by PCR: NEGATIVE
SARS Coronavirus 2 by RT PCR: NEGATIVE

## 2021-02-26 LAB — TROPONIN I (HIGH SENSITIVITY)
Troponin I (High Sensitivity): 19 ng/L — ABNORMAL HIGH (ref <18)
Troponin I (High Sensitivity): 46 ng/L — ABNORMAL HIGH (ref <18)

## 2021-02-26 MED ORDER — FOLIC ACID 1 MG PO TABS
1.0000 mg | ORAL_TABLET | Freq: Every day | ORAL | Status: DC
  Administered 2021-02-27: 1 mg via ORAL
  Filled 2021-02-26: qty 1

## 2021-02-26 MED ORDER — ACETAMINOPHEN 325 MG PO TABS: 650.0000 mg | ORAL_TABLET | Freq: Four times a day (QID) | ORAL | Status: DC | PRN

## 2021-02-26 MED ORDER — FENTANYL CITRATE (PF) 100 MCG/2ML IJ SOLN
50.0000 ug | INTRAMUSCULAR | Status: DC | PRN
  Filled 2021-02-26: qty 2

## 2021-02-26 MED ORDER — QUETIAPINE FUMARATE 25 MG PO TABS: 12.5000 mg | ORAL_TABLET | Freq: Every day | ORAL | Status: DC

## 2021-02-26 MED ORDER — SODIUM CHLORIDE 0.9 % IV SOLN: INTRAVENOUS | Status: DC

## 2021-02-26 MED ORDER — VITAMIN B-12 1000 MCG PO TABS
1000.0000 ug | ORAL_TABLET | Freq: Every day | ORAL | Status: DC
  Administered 2021-02-27: 1000 ug via ORAL
  Filled 2021-02-26: qty 1

## 2021-02-26 MED ORDER — VITAMIN D 25 MCG (1000 UNIT) PO TABS
1000.0000 [IU] | ORAL_TABLET | Freq: Every day | ORAL | Status: DC
  Administered 2021-02-27: 1000 [IU] via ORAL
  Filled 2021-02-26: qty 1

## 2021-02-26 MED ORDER — ETOMIDATE 2 MG/ML IV SOLN
0.1500 mg/kg | Freq: Once | INTRAVENOUS | Status: AC
  Administered 2021-02-26: 8.5 mg via INTRAVENOUS
  Filled 2021-02-26: qty 10

## 2021-02-26 MED ORDER — LISINOPRIL 10 MG PO TABS
10.0000 mg | ORAL_TABLET | Freq: Every day | ORAL | Status: DC
  Administered 2021-02-27: 10 mg via ORAL
  Filled 2021-02-26: qty 1

## 2021-02-26 MED ORDER — SODIUM CHLORIDE 0.9% FLUSH: 3.0000 mL | Freq: Two times a day (BID) | INTRAVENOUS | Status: DC

## 2021-02-26 MED ORDER — FENTANYL CITRATE (PF) 100 MCG/2ML IJ SOLN
25.0000 ug | INTRAMUSCULAR | Status: DC | PRN
  Administered 2021-02-26: 25 ug via INTRAVENOUS
  Filled 2021-02-26: qty 2

## 2021-02-26 MED ORDER — ACETAMINOPHEN 650 MG RE SUPP: 650.0000 mg | Freq: Four times a day (QID) | RECTAL | Status: DC | PRN

## 2021-02-26 MED ORDER — FENTANYL CITRATE (PF) 100 MCG/2ML IJ SOLN
INTRAMUSCULAR | Status: AC | PRN
  Administered 2021-02-26: 50 ug via INTRAVENOUS

## 2021-02-26 MED ORDER — BRIMONIDINE TARTRATE 0.2 % OP SOLN
1.0000 [drp] | Freq: Two times a day (BID) | OPHTHALMIC | Status: DC
  Filled 2021-02-26: qty 5

## 2021-02-26 MED ORDER — ONDANSETRON HCL 4 MG/2ML IJ SOLN: 4.0000 mg | Freq: Four times a day (QID) | INTRAMUSCULAR | Status: DC | PRN

## 2021-02-26 MED ORDER — MORPHINE SULFATE (PF) 2 MG/ML IV SOLN
2.0000 mg | INTRAVENOUS | Status: DC | PRN
Start: 2021-02-26 — End: 2021-02-27

## 2021-02-26 MED ORDER — ONDANSETRON HCL 4 MG PO TABS
4.0000 mg | ORAL_TABLET | Freq: Four times a day (QID) | ORAL | Status: DC | PRN
Start: 2021-02-26 — End: 2021-02-27

## 2021-02-26 MED ORDER — DORZOLAMIDE HCL-TIMOLOL MAL 2-0.5 % OP SOLN
1.0000 [drp] | Freq: Two times a day (BID) | OPHTHALMIC | Status: DC
  Filled 2021-02-26: qty 10

## 2021-02-26 MED ORDER — ACETAMINOPHEN 500 MG PO TABS: 1000.0000 mg | ORAL_TABLET | Freq: Four times a day (QID) | ORAL | Status: DC | PRN

## 2021-02-26 MED ORDER — ASPIRIN EC 81 MG PO TBEC
81.0000 mg | DELAYED_RELEASE_TABLET | Freq: Every day | ORAL | Status: DC
  Administered 2021-02-27: 81 mg via ORAL
  Filled 2021-02-26: qty 1

## 2021-02-26 MED ORDER — ETOMIDATE 2 MG/ML IV SOLN
INTRAVENOUS | Status: AC | PRN
  Administered 2021-02-26: 8.5 mg via INTRAVENOUS

## 2021-02-26 MED ORDER — VITAMIN E 45 MG (100 UNIT) PO CAPS
400.0000 [IU] | ORAL_CAPSULE | Freq: Every day | ORAL | Status: DC
  Administered 2021-02-27: 400 [IU] via ORAL
  Filled 2021-02-26: qty 4

## 2021-02-26 MED ORDER — ONDANSETRON HCL 4 MG/2ML IJ SOLN
4.0000 mg | Freq: Four times a day (QID) | INTRAMUSCULAR | Status: DC | PRN
  Administered 2021-02-26: 4 mg via INTRAVENOUS
  Filled 2021-02-26: qty 2

## 2021-02-26 MED ORDER — ONDANSETRON HCL 4 MG PO TABS: 4.0000 mg | ORAL_TABLET | Freq: Four times a day (QID) | ORAL | Status: DC | PRN

## 2021-02-26 MED ORDER — ZIPRASIDONE HCL 20 MG PO CAPS: 20.0000 mg | ORAL_CAPSULE | ORAL | Status: DC

## 2021-02-26 MED ORDER — ENOXAPARIN SODIUM 40 MG/0.4ML IJ SOSY
40.0000 mg | PREFILLED_SYRINGE | INTRAMUSCULAR | Status: DC
  Administered 2021-02-26: 40 mg via SUBCUTANEOUS
  Filled 2021-02-26: qty 0.4

## 2021-02-26 NOTE — ED Notes (Addendum)
Pt calm , collective , denies pain.  Procedural consent form signed prior to procedure. Pt was Alert and oriented and advised of risks and benefits. Time out was called with Myself, Marijo Conception Nurse , and MD. Roxan Hockey.  Vitals monitored and shoulder reduction verified with xray confirmation. Pt is currently resting with granddaughter at bedside. Should sling placed on bilateral shoulder   Vitals will be continuously monitored

## 2021-02-26 NOTE — ED Notes (Signed)
Pt in x-ray. Radiology informed to bring pt back to room 38. Will obtain EKG and blood work upon return to room

## 2021-02-26 NOTE — ED Triage Notes (Signed)
First RN Note: Pt to ED via [facility] with c/o fall, per EMS pt from home. Per EMS pt had unwitnessed fall at some time this morning. Per EMS pt with no LOC, denies hitting her head. Per EMS pt with c/o bilateral shoulder pain, no obvious deformity to either shoulder, able to move both arms. Per EMS pt stated she was just walking and then was on the ground, per EMS pt reported she fell in the doorway.   CBG 207 107 ST w/ occ PVC 100% RA 190/112

## 2021-02-26 NOTE — H&P (Addendum)
History and Physical   Diana Roberson BPZ:[id number] DOB: 1945/11/09 DOA: 02/26/2021  PCP: Karie Schwalbe, MD  Outpatient Specialists: Dr. Cecille Amsterdam Patient coming from: home via EMS  I have personally briefly reviewed patient's old medical records in [facility] EMR.  Chief Concern: fall with bilateral shoulder dislocation  HPI: Diana Roberson is a 75 y.o. female with medical history significant for hypertension, rheumatoid arthritis, seropositive, ataxia, bipolar 1 disorder, non-insulin-dependent diabetes mellitus, hyperlipidemia, osteoarthritis, history of cholecystectomy, history of diverticulosis, history of right elbow fracture status post ORIF in 06/24/2019, presents emergency department for chief concerns of a fall.  She was walking from kitchen to bathroom and 'suddenly, I fell'. She wanted to put something away in the bathroom. She reports initially the pain was a 10/10, constant, sharp, and now it is a 2/10 and worse with moving.   Prior to falling, she denies shortness of breath, chest pain, abdominal pain, vision changes including double vision, curtains coming down, dysuria, hematuria, diarrhea, changes to diet.   She reports that she probably drinks about 3 eight cups of water per day. She denies going outside recently in the last 3 days due to the heat.   She went to indoor walking track on 02/25/21 and walked about three labs. That was the first time she went to the walking track. She frequently walks around her neighborhood however stopped in the last three days due to it being too hot.   She denies loosing consciousness and states she was aware as she fell. She reports the floor in her home is hardwood and nothing was in her way. She denies shuffling gait and endorses dragging her feet at baseline.   At baseline, she does not walk with a walker or cane.  She endorses having a cane however she does not want to use it.  She reports a similar fall happened before  about 1.5-2 months.  She also endorses that in the last 2 months she has noticed bilateral lower extremity edema in her legs.  Granddaughter, Dyke Maes, reports that her dragging walk used to be worse when she was on a higher dose of Geodon and her gait has improved since Geodon was gradually reduced.   Dyke Maes came back from the grocery and found patient and called EMS because her grandmother states she was hurting too much to get up. Dyke Maes was gone for about 15 minutes, and Dyke Maes last saw patient sitting in chair in the kitchen.  Social history: she lives at home by herself with care assistance about 80%. She denies etoh, tobacco use, recreational drug use. 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  Vaccination: She is not vaccinated for covid   ROS: Constitutional: no weight change, no fever ENT/Mouth: no sore throat, no rhinorrhea Eyes: no eye pain, no vision changes Cardiovascular: no chest pain, no dyspnea,  no edema, no palpitations Respiratory: no cough, no sputum, no wheezing Gastrointestinal: no nausea, no vomiting, no diarrhea, no constipation Genitourinary: no urinary incontinence, no dysuria, no hematuria Musculoskeletal: no arthralgias, no myalgias Skin: no skin lesions, no pruritus, Neuro: + weakness, no loss of consciousness, no syncope Psych: no anxiety, no depression, + decrease appetite Heme/Lymph: no bruising, no bleeding  ED Course: Discussed with EDP, patient requiring hospitalization due to possible syncope in setting of falling with bilateral shoulder dislocation.  Vitals in the emergency department was remarkable for temperature of 98.5, heart rate of 102, respiration  rate of 20, blood pressure 167/114, SPO2 of 98% on room air.  Patient bilateral shoulder was manually reduced by EDP and emergency medicine team.  Etomidate 8.5 mg, fentanyl 50 mcg IV were used for the shoulder  reduction.  EDP ordered a head CT without contrast and was read as no acute intracranial abnormality.  Assessment/Plan  Principal Problem:   Syncope Active Problems:   Hyperlipemia   Bipolar 1 disorder, manic, full remission (HCC)   Seropositive rheumatoid arthritis (HCC)   Gastroesophageal reflux disease   Essential hypertension, benign   # Fall-suspect secondary to patient's ataxia versus vasovagal however syncope cannot be excluded - Complete echo ordered - Orthostatic vitals ordered - Portable chest x-ray ordered to exclude pneumonia - Fall precautions - PT, OT ordered - TSH, CBC, BMP in the a.m.  # Elevated HS troponin-  - Recent bilateral trace pitting edema - Echo ordered -Third high-sensitivity troponin ordered  # Bilateral shoulder dislocation-status post manual reduction by EDP team -Continue bilateral sling in place - Pain control: Morphine 2 mg IV every 4 hours as needed for moderate pain, 1 day ordered; fentanyl 25 mcg every 2 hours as needed for severe pain, 12 hours ordered - Acetaminophen 1000 mg every 6 hours as needed for mild pain, fever, headache  # Atherosclerosis of the aorta on chest x -ray -outpatient follow-up  # Right lower shin ecchymosis-present admission suspect secondary to the fall  # History of bipolar disorder-previously on Geodon 80 mg daily dosing has been reduced to 20 mg twice daily - Geodon 20 mg twice daily resumed - Quetiapine nightly resumed per home dosing and administrative instruction  # Rheumatoid arthritis, seropositive - 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   12/01/2020: Patient was seen and diagnosed with UTI and prescribed cefdinir 300 mg twice daily, 7 days.  Patient was also prescribed prednisone 10 mg twice daily for 5 days along with doxycycline 100 mg capsule twice daily for 7 days for cough.  DVT prophylaxis: Enoxaparin 40 mg subcutaneous Code Status: Full code Diet: Heart healthy Family Communication: Dyke Maes, granddaughter at bedside  Disposition Plan: Pending clinical course Consults called: None at this time Admission status: MedSurg, observation, telemetry for 24 hours  Past Medical History:  Diagnosis Date   Allergy    Bipolar 1 disorder (HCC)    Chronic back pain    Diabetes mellitus    Difficult intubation    Diverticulitis    GERD (gastroesophageal reflux disease)    Glaucoma    Hyperlipidemia    Hypertension    Mental health disorder    admitted x3   Osteopenia    Rheumatoid arthritis (HCC)    Past Surgical History:  Procedure Laterality Date   APPENDECTOMY     BACK SURGERY  1988   BREAST CYST ASPIRATION     neg   CHOLECYSTECTOMY     COLONOSCOPY     COLONOSCOPY WITH PROPOFOL N/A 11/27/2017   Procedure: COLONOSCOPY WITH PROPOFOL;  Surgeon: Marva Panda,  Cindra Eves, MD;  Location: [facility] ENDOSCOPY;  Service: Endoscopy;  Laterality: N/A;   HAND SURGERY     ORIF ELBOW FRACTURE Right 06/24/2019   Procedure: OPEN REDUCTION INTERNAL FIXATION (ORIF) ELBOW/OLECRANON FRACTURE;  Surgeon: Christena Flake, MD;  Location: [facility] ORS;  Service: Orthopedics;  Laterality: Right;   Social History:  reports that she has never smoked. She has never used smokeless tobacco. She reports that she does not drink alcohol and does not use drugs.  Allergies  Allergen Reactions   Codeine Sulfate Other (See Comments)    Indigestion and epigastric pain    Sertraline Hcl Other (See Comments)    REACTION: Worse, ? irritable   Family History  Problem Relation Age of Onset   Diabetes Mother    Hypertension Mother    Stroke Sister    Hashimoto's thyroiditis Daughter    Stroke Father    Stroke Sister    Cancer Neg Hx    Breast cancer Neg Hx    Family history: Family history reviewed and not pertinent  Prior to Admission medications   Medication Sig Start Date End Date Taking? Authorizing Provider  aspirin EC 81 MG tablet Take 81 mg by mouth daily.    [provider]  brimonidine (ALPHAGAN) 0.2 % ophthalmic solution Place 1 drop into both eyes 2 (two) times daily.    [provider]  cholecalciferol (VITAMIN D) 1000 UNITS tablet Take 1,000 Units by mouth daily.    [provider]  dorzolamide-timolol (COSOPT) 22.3-6.8 MG/ML ophthalmic solution Place 1 drop into both eyes 2 (two) times daily.    [provider]  folic acid (FOLVITE) 1 MG tablet Take 1 tablet (1 mg total) by mouth daily. 01/07/18   Tillman Abide I, MD  glucose blood (ONETOUCH VERIO) test strip Use to check blood sugar once a day. Dx Code E11.9 01/15/19   Karie Schwalbe, MD  hydroxychloroquine (PLAQUENIL) 200 MG tablet Take 200 mg by mouth daily. 10/11/20   [provider]  lisinopril (ZESTRIL) 10 MG tablet Take 1 tablet (10 mg total) by mouth daily. 11/08/20   Karie Schwalbe, MD  methotrexate (RHEUMATREX) 2.5 MG tablet Take 7 tablets (17.5 mg total) by mouth once a week. Caution:Chemotherapy. Protect from light. 09/21/20   Karie Schwalbe, MD  OneTouch Delica Lancets 33G MISC 1 each by Does not apply route daily. Dx Code E11.9 01/15/19   Karie Schwalbe, MD  QUEtiapine (SEROQUEL) 25 MG tablet Take 0.5-1 tablets (12.5-25 mg total) by mouth at bedtime. 02/23/21   Karie Schwalbe, MD  vitamin B-12 (CYANOCOBALAMIN) 1000 MCG tablet Take 1,000 mcg by mouth daily.    [provider]  vitamin E 400 UNIT capsule Take 400 Units by  mouth daily.    [provider]  ziprasidone (GEODON) 20 MG capsule Take 1 capsule (20 mg total) by mouth in the morning and at bedtime. 01/19/21 02/18/21  Karie Schwalbe, MD   Physical Exam: Vitals:   02/26/21 1715 02/26/21 1730 02/26/21 1745 02/26/21 1800  BP: (!) 204/101 (!) 186/90 (!) 190/95 (!) 165/92  Pulse: 86 82 89 85  Resp:    18  Temp:      TempSrc:      SpO2: 100% 100% 100% 100%  Weight:      Height:       Constitutional: appears age-appropriate, frail, NAD, calm, comfortable Eyes: PERRL, lids and conjunctivae normal ENMT: Mucous membranes are moist. Posterior pharynx clear of any exudate  or lesions. Age-appropriate dentition. Hearing appropriate Neck: normal, supple, no masses, no thyromegaly Respiratory: clear to auscultation bilaterally, no wheezing, no crackles. Normal respiratory effort. No accessory muscle use.  Cardiovascular: Regular rate and rhythm, no murmurs / rubs / gallops.  Bilateral lower trace to 1+ pitting edema. 2+ pedal pulses. No carotid bruits.  Abdomen: no tenderness, no masses palpated, no hepatosplenomegaly. Bowel sounds positive.  Musculoskeletal: no clubbing / cyanosis. No joint deformity upper and lower extremities. Good ROM, no contractures, no atrophy. Normal muscle tone.  Multiple rheumatoid arthritis joint deformity bilateral hands.  Bilateral sling in place Skin: no rashes, lesions, ulcers. No induration.  Ecchymosis at the right lower chin.  Multiple areas of ecchymosis in bilateral upper extremities Neurologic: Sensation intact. Strength 5/5 in all 4.  Psychiatric: Normal judgment and insight. Alert and oriented x 3. Normal mood.   EKG: independently reviewed, showing sinus tachycardia with rate of 114, QTc 468  Chest x-ray on Admission: I personally reviewed and I agree with radiologist reading as below.  DG Shoulder 1 View Right  Result Date: 02/26/2021 CLINICAL DATA:  Post reduction EXAM: RIGHT SHOULDER - 1 VIEW COMPARISON:   02/26/2021 FINDINGS: Single view of the right shoulder reveal satisfactory reduction. No fracture seen on this projection. IMPRESSION: Satisfactory right shoulder reduction. Electronically Signed   By: Marlan Palau M.D.   On: 02/26/2021 16:35   DG Shoulder Right  Result Date: 02/26/2021 CLINICAL DATA:  Fall EXAM: RIGHT SHOULDER - 2+ VIEW COMPARISON:  None. FINDINGS: Anterior dislocation of the shoulder. No fracture. AC joint intact. IMPRESSION: Anterior shoulder dislocation. Electronically Signed   By: Marlan Palau M.D.   On: 02/26/2021 15:02   CT Head Wo Contrast  Result Date: 02/26/2021 CLINICAL DATA:  Unwitnessed fall. EXAM: CT HEAD WITHOUT CONTRAST TECHNIQUE: Contiguous axial images were obtained from the base of the skull through the vertex without intravenous contrast. COMPARISON:  December 25, 2020 FINDINGS: Brain: No evidence of acute infarction, hemorrhage, hydrocephalus, extra-axial collection or mass lesion/mass effect. Moderate white matter small vessel disease. Vascular: Calcific atherosclerotic disease of the intra cavernous carotid arteries. Skull: Normal. Negative for fracture or focal lesion. Sinuses/Orbits: No acute finding. Other: None. IMPRESSION: No acute intracranial abnormality. Electronically Signed   By: Ted Mcalpine M.D.   On: 02/26/2021 19:09   DG Shoulder Left  Result Date: 02/26/2021 CLINICAL DATA:  Fall.  Injury EXAM: LEFT SHOULDER - 2+ VIEW COMPARISON:  None. FINDINGS: Anterior dislocation of the shoulder. Possible Hill-Sachs deformity. No other fracture. IMPRESSION: Anterior dislocation of shoulder. Possible Hill-Sachs deformity of the humerus. Electronically Signed   By: Marlan Palau M.D.   On: 02/26/2021 15:00   DG Shoulder Left Port  Result Date: 02/26/2021 CLINICAL DATA:  Post reduction EXAM: LEFT SHOULDER COMPARISON:  02/26/2021 FINDINGS: Satisfactory left shoulder reduction.  Hill-Sachs deformity. IMPRESSION: Satisfactory shoulder reduction. Hill-Sachs  deformity of the humerus. Electronically Signed   By: Marlan Palau M.D.   On: 02/26/2021 16:39    Labs on Admission: I have personally reviewed following labs  CBC: Recent Labs  Lab 02/26/21 1412  WBC 14.0*  NEUTROABS 11.2*  HGB 12.0  HCT 35.3*  MCV 97.5  PLT 311   Basic Metabolic Panel: Recent Labs  Lab 02/26/21 1412  NA 133*  K 3.9  CL 103  CO2 20*  GLUCOSE 249*  BUN 29*  CREATININE 0.97  CALCIUM 9.5   GFR: Estimated Creatinine Clearance: 43.3 mL/min (by C-G formula based on SCr of 0.97 mg/dL).  Liver  Function Tests: Recent Labs  Lab 02/26/21 1412  AST 33  ALT 24  ALKPHOS 89  BILITOT 1.0  PROT 7.6  ALBUMIN 4.2   Urine analysis:    Component Value Date/Time   COLORURINE YELLOW (A) 02/26/2021 1412   APPEARANCEUR CLEAR (A) 02/26/2021 1412   LABSPEC 1.014 02/26/2021 1412   PHURINE 6.0 02/26/2021 1412   GLUCOSEU 150 (A) 02/26/2021 1412   HGBUR NEGATIVE 02/26/2021 1412   HGBUR small 09/19/2007 1215   BILIRUBINUR NEGATIVE 02/26/2021 1412   BILIRUBINUR Negative 02/04/2019 1452   KETONESUR NEGATIVE 02/26/2021 1412   PROTEINUR 100 (A) 02/26/2021 1412   UROBILINOGEN 0.2 02/04/2019 1452   UROBILINOGEN 0.2 09/19/2007 1215   NITRITE NEGATIVE 02/26/2021 1412   LEUKOCYTESUR NEGATIVE 02/26/2021 1412   Kionna Brier N Paulena Servais D.O. [facility]  If 7PM-7AM, please contact overnight-coverage provider If 7AM-7PM, please contact day coverage provider [email or web address]  02/26/2021, 7:43 PM

## 2021-02-26 NOTE — ED Provider Notes (Signed)
[facility] Emergency Department Provider Note    Event Date/Time   First MD Initiated Contact with Patient 02/26/21 1505     (approximate)  I have reviewed the triage vital signs and the nursing notes.   HISTORY  Chief Complaint Fall    HPI Diana Roberson is a 75 y.o. female the below listed past medical history presents to the ER for evaluation of bilateral shoulder pain that occurred after fall today.  States that she is all does not recall whether she tripped on anything did not lose consciousness and remembers falling to the ground with injury primarily her right shoulder.  Did have both hands outstretched.  Did not hit her head.  Denies any neck pain.  No chest pain or shortness of breath.  No numbness or tingling.  States the pain is mild to moderate.  Past Medical History:  Diagnosis Date   Allergy    Bipolar 1 disorder (HCC)    Chronic back pain    Diabetes mellitus    Difficult intubation    Diverticulitis    GERD (gastroesophageal reflux disease)    Glaucoma    Hyperlipidemia    Hypertension    Mental health disorder    admitted x3   Osteopenia    Rheumatoid arthritis (HCC)    Family History  Problem Relation Age of Onset   Diabetes Mother    Hypertension Mother    Stroke Sister    Hashimoto's thyroiditis Daughter    Stroke Father    Stroke Sister    Cancer Neg Hx    Breast cancer Neg Hx    Past Surgical History:  Procedure Laterality Date   APPENDECTOMY     BACK SURGERY  1988   BREAST CYST ASPIRATION     neg   CHOLECYSTECTOMY     COLONOSCOPY     COLONOSCOPY WITH PROPOFOL N/A 11/27/2017   Procedure: COLONOSCOPY WITH PROPOFOL;  Surgeon: Christena Deem, MD;  Location: [facility] ENDOSCOPY;  Service: Endoscopy;  Laterality: N/A;   HAND SURGERY     ORIF ELBOW FRACTURE Right 06/24/2019   Procedure: OPEN REDUCTION INTERNAL FIXATION (ORIF) ELBOW/OLECRANON FRACTURE;  Surgeon: Christena Flake, MD;  Location: [facility] ORS;  Service:  Orthopedics;  Laterality: Right;   Patient Active Problem List   Diagnosis Date Noted   Anemia 11/13/2020   Gait disorder 11/08/2020   OAB (overactive bladder) 07/19/2020   Essential hypertension, benign 01/15/2020   Gastroesophageal reflux disease 11/01/2017   Type 2 diabetes mellitus with circulatory disorder (HCC) 12/28/2015   Seropositive rheumatoid arthritis (HCC) 12/28/2015   Glaucoma 04/30/2015   Advance directive discussed with patient 04/30/2015   Routine general medical examination at a health care facility 12/06/2012   CAROTID BRUIT 11/08/2010   Bipolar 1 disorder, manic, full remission (HCC) 10/01/2009   OSTEOPENIA 07/26/2007   Hyperlipemia 01/22/2007   DIVERTICULOSIS, COLON 01/08/2007   Chronic back pain 01/08/2007      Prior to Admission medications   Medication Sig Start Date End Date Taking? Authorizing Provider  aspirin EC 81 MG tablet Take 81 mg by mouth daily.    [provider]  brimonidine (ALPHAGAN) 0.2 % ophthalmic solution Place 1 drop into both eyes 2 (two) times daily.    [provider]  cholecalciferol (VITAMIN D) 1000 UNITS tablet Take 1,000 Units by mouth daily.    [provider]  dorzolamide-timolol (COSOPT) 22.3-6.8 MG/ML ophthalmic solution Place 1 drop into both eyes 2 (two) times daily.  [provider]  folic acid (FOLVITE) 1 MG tablet Take 1 tablet (1 mg total) by mouth daily. 01/07/18   Tillman Abide I, MD  glucose blood (ONETOUCH VERIO) test strip Use to check blood sugar once a day. Dx Code E11.9 01/15/19   Karie Schwalbe, MD  hydroxychloroquine (PLAQUENIL) 200 MG tablet Take 200 mg by mouth daily. 10/11/20   [provider]  lisinopril (ZESTRIL) 10 MG tablet Take 1 tablet (10 mg total) by mouth daily. 11/08/20   Karie Schwalbe, MD  methotrexate (RHEUMATREX) 2.5 MG tablet Take 7 tablets (17.5 mg total) by mouth once a week. Caution:Chemotherapy. Protect from light. 09/21/20   Karie Schwalbe,  MD  OneTouch Delica Lancets 33G MISC 1 each by Does not apply route daily. Dx Code E11.9 01/15/19   Karie Schwalbe, MD  QUEtiapine (SEROQUEL) 25 MG tablet Take 0.5-1 tablets (12.5-25 mg total) by mouth at bedtime. 02/23/21   Karie Schwalbe, MD  vitamin B-12 (CYANOCOBALAMIN) 1000 MCG tablet Take 1,000 mcg by mouth daily.    [provider]  vitamin E 400 UNIT capsule Take 400 Units by mouth daily.    [provider]  ziprasidone (GEODON) 20 MG capsule Take 1 capsule (20 mg total) by mouth in the morning and at bedtime. 01/19/21 02/18/21  Karie Schwalbe, MD    Allergies Codeine sulfate and Sertraline hcl    Social History Social History   Tobacco Use   Smoking status: Never   Smokeless tobacco: Never  Vaping Use   Vaping Use: Never used  Substance Use Topics   Alcohol use: No   Drug use: No    Review of Systems Patient denies headaches, rhinorrhea, blurry vision, numbness, shortness of breath, chest pain, edema, cough, abdominal pain, nausea, vomiting, diarrhea, dysuria, fevers, rashes or hallucinations unless otherwise stated above in HPI. ____________________________________________   PHYSICAL EXAM:  VITAL SIGNS: Vitals:   02/26/21 1745 02/26/21 1800  BP: (!) 190/95 (!) 165/92  Pulse: 89 85  Resp:  18  Temp:    SpO2: 100% 100%    Constitutional: Alert and oriented.  Eyes: Conjunctivae are normal.  Head: Atraumatic. Nose: No congestion/rhinnorhea. Mouth/Throat: Mucous membranes are moist.   Neck: No stridor. Painless ROM.  Cardiovascular: Normal rate, regular rhythm. Grossly normal heart sounds.  Good peripheral circulation. Respiratory: Normal respiratory effort.  No retractions. Lungs CTAB. Gastrointestinal: Soft and nontender. No distention. No abdominal bruits. No CVA tenderness. Genitourinary:  Musculoskeletal:  Bilateral deformity to shoulders,  N/v intact distally. No lower extremity tenderness nor edema.  No joint  effusions. Neurologic:  Normal speech and language. No gross focal neurologic deficits are appreciated. No facial droop Skin:  Skin is warm, dry and intact. No rash noted. Psychiatric: Mood and affect are normal. Speech and behavior are normal.  ____________________________________________   LABS (all labs ordered are listed, but only abnormal results are displayed)  Results for orders placed or performed during the hospital encounter of 02/26/21 (from the past 24 hour(s))  CBC with Differential     Status: Abnormal   Collection Time: 02/26/21  2:12 PM  Result Value Ref Range   WBC 14.0 (H) 4.0 - 10.5 K/uL   RBC 3.62 (L) 3.87 - 5.11 MIL/uL   Hemoglobin 12.0 12.0 - 15.0 g/dL   HCT 40.3 (L) 47.4 - 25.9 %   MCV 97.5 80.0 - 100.0 fL   MCH 33.1 26.0 - 34.0 pg   MCHC 34.0 30.0 - 36.0 g/dL  RDW 15.2 11.5 - 15.5 %   Platelets 311 150 - 400 K/uL   nRBC 0.0 0.0 - 0.2 %   Neutrophils Relative % 80 %   Neutro Abs 11.2 (H) 1.7 - 7.7 K/uL   Lymphocytes Relative 12 %   Lymphs Abs 1.7 0.7 - 4.0 K/uL   Monocytes Relative 7 %   Monocytes Absolute 1.0 0.1 - 1.0 K/uL   Eosinophils Relative 1 %   Eosinophils Absolute 0.1 0.0 - 0.5 K/uL   Basophils Relative 0 %   Basophils Absolute 0.0 0.0 - 0.1 K/uL   Immature Granulocytes 0 %   Abs Immature Granulocytes 0.04 0.00 - 0.07 K/uL  Comprehensive metabolic panel     Status: Abnormal   Collection Time: 02/26/21  2:12 PM  Result Value Ref Range   Sodium 133 (L) 135 - 145 mmol/L   Potassium 3.9 3.5 - 5.1 mmol/L   Chloride 103 98 - 111 mmol/L   CO2 20 (L) 22 - 32 mmol/L   Glucose, Bld 249 (H) 70 - 99 mg/dL   BUN 29 (H) 8 - 23 mg/dL   Creatinine, Ser 1.61 0.44 - 1.00 mg/dL   Calcium 9.5 8.9 - 09.6 mg/dL   Total Protein 7.6 6.5 - 8.1 g/dL   Albumin 4.2 3.5 - 5.0 g/dL   AST 33 15 - 41 U/L   ALT 24 0 - 44 U/L   Alkaline Phosphatase 89 38 - 126 U/L   Total Bilirubin 1.0 0.3 - 1.2 mg/dL   GFR, Estimated >04 >54 mL/min   Anion gap 10 5 - 15   Urinalysis, Complete w Microscopic     Status: Abnormal   Collection Time: 02/26/21  2:12 PM  Result Value Ref Range   Color, Urine YELLOW (A) YELLOW   APPearance CLEAR (A) CLEAR   Specific Gravity, Urine 1.014 1.005 - 1.030   pH 6.0 5.0 - 8.0   Glucose, UA 150 (A) NEGATIVE mg/dL   Hgb urine dipstick NEGATIVE NEGATIVE   Bilirubin Urine NEGATIVE NEGATIVE   Ketones, ur NEGATIVE NEGATIVE mg/dL   Protein, ur 098 (A) NEGATIVE mg/dL   Nitrite NEGATIVE NEGATIVE   Leukocytes,Ua NEGATIVE NEGATIVE   RBC / HPF 0-5 0 - 5 RBC/hpf   WBC, UA 0-5 0 - 5 WBC/hpf   Bacteria, UA NONE SEEN NONE SEEN   Squamous Epithelial / LPF 6-10 0 - 5   Mucus PRESENT    Hyaline Casts, UA PRESENT   Troponin I (High Sensitivity)     Status: Abnormal   Collection Time: 02/26/21  2:12 PM  Result Value Ref Range   Troponin I (High Sensitivity) 19 (H) <18 ng/L  Troponin I (High Sensitivity)     Status: Abnormal   Collection Time: 02/26/21  5:11 PM  Result Value Ref Range   Troponin I (High Sensitivity) 46 (H) <18 ng/L  Resp Panel by RT-PCR (Flu A&B, Covid) Nasopharyngeal Swab     Status: None   Collection Time: 02/26/21  6:08 PM   Specimen: Nasopharyngeal Swab; Nasopharyngeal(NP) swabs in vial transport medium  Result Value Ref Range   SARS Coronavirus 2 by RT PCR NEGATIVE NEGATIVE   Influenza A by PCR NEGATIVE NEGATIVE   Influenza B by PCR NEGATIVE NEGATIVE   ____________________________________________  EKG My review and personal interpretation at Time: 14:52   Indication: fall  Rate: 115  Rhythm: sinus Axis: normal Other: normal intervals, no stemi ____________________________________________  RADIOLOGY  I personally reviewed all radiographic images ordered to evaluate  for the above acute complaints and reviewed radiology reports and findings.  These findings were personally discussed with the patient.  Please see medical record for radiology  report.  ____________________________________________   PROCEDURES  Procedure(s) performed:  .Ortho Injury Treatment  Date/Time: 02/26/2021 4:29 PM Performed by: [name], MD Authorized by: [name], MD   Consent:    Consent obtained:  Written   Consent given by:  Patient   Risks discussed:  Fracture, nerve damage, irreducible dislocation, recurrent dislocation, stiffness, restricted joint movement and vascular damage   Alternatives discussed:  Alternative treatment, no treatment, immobilization and referralInjury location: shoulder Location details: left shoulder Injury type: dislocation Dislocation type: anterior Hill-Sachs deformity: yes Chronicity: new Pre-procedure neurovascular assessment: neurovascularly intact Pre-procedure range of motion: reduced  Patient sedated: Yes. Refer to sedation procedure documentation for details of sedation. Manipulation performed: yes Reduction method: traction and counter traction and scapular manipulation Reduction successful: yes X-ray confirmed reduction: yes Immobilization: sling Splint Applied by: ED Provider Post-procedure neurovascular assessment: post-procedure neurovascularly intact Post-procedure range of motion: improved   .Sedation  Date/Time: 02/26/2021 4:30 PM Performed by: [name], MD Authorized by: [name], MD   Consent:    Consent obtained:  Written   Consent given by:  Patient   Risks discussed:  Allergic reaction, dysrhythmia, inadequate sedation, prolonged sedation necessitating reversal, respiratory compromise necessitating ventilatory assistance and intubation, prolonged hypoxia resulting in organ damage and vomiting   Alternatives discussed:  Analgesia without sedation and regional anesthesia Universal protocol:    Immediately prior to procedure, a time out was called: yes   Pre-sedation assessment:    Time since last food or drink:  6   ASA classification: class 2 -  patient with mild systemic disease     Mouth opening:  3 or more finger widths   Thyromental distance:  3 finger widths   Mallampati score:  II - soft palate, uvula, fauces visible   Neck mobility: normal     Pre-sedation assessments completed and reviewed: airway patency, cardiovascular function, hydration status, mental status, nausea/vomiting, pain level, respiratory function and temperature   Immediate pre-procedure details:    Reassessment: Patient reassessed immediately prior to procedure     Reviewed: vital signs     Verified: bag valve mask available, emergency equipment available, intubation equipment available, IV patency confirmed, oxygen available and suction available   Procedure details (see MAR for exact dosages):    Preoxygenation:  Nasal cannula   Sedation:  Etomidate   Intended level of sedation: deep   Analgesia:  Fentanyl   Intra-procedure monitoring:  Blood pressure monitoring, cardiac monitor, continuous capnometry, continuous pulse oximetry, frequent vital sign checks and frequent LOC assessments   Intra-procedure events: none     Total Provider sedation time (minutes):  5 Post-procedure details:    Attendance: Constant attendance by certified staff until patient recovered     Recovery: Patient returned to pre-procedure baseline     Procedure completion:  Tolerated well, no immediate complications    Critical Care performed: no ____________________________________________   INITIAL IMPRESSION / ASSESSMENT AND PLAN / ED COURSE  Pertinent labs & imaging results that were available during my care of the patient were reviewed by me and considered in my medical decision making (see chart for details).   DDX: fracture, contusion, dislocation, dysrhythmia, dehydration, orthostasis, sepsis  Diana Roberson is a 75 y.o. who presents to the ED with presentation as described above with evidence of bilateral shoulder dislocation.  No history to  suggest seizure activity.   No evidence of other associated injury.  She is neurovascular intact.  Somewhat unusual presentation possible syncope though she denies any LOC.  Blood will be sent for the above differential.  Patient has consented to procedural sedation for closed reduction here in the ER.  Clinical Course as of 02/26/21 1924  Sat Feb 26, 2021  1628 Patient tolerated bilateral reduction under procedural sedation without complication with successful reduction confirmed by radiographs.  Neurovascular intact postreduction. [PR]  1914 Patient's repeat troponin did increase she is denying any chest pain right now normal heart rate and rhythm on cardiac monitor.  Given fall with generalized weakness rising troponin do feel that hospitalization indicated.  CT head ordered to evaluate for any evidence of CNS abnormality is reassuring.  Will consult hospitalist. [PR]    Clinical Course User Index [PR] [name], MD    The patient was evaluated in Emergency Department today for the symptoms described in the history of present illness. He/she was evaluated in the context of the global COVID-19 pandemic, which necessitated consideration that the patient might be at risk for infection with the SARS-CoV-2 virus that causes COVID-19. Institutional protocols and algorithms that pertain to the evaluation of patients at risk for COVID-19 are in a state of rapid change based on information released by regulatory bodies including the CDC and federal and state organizations. These policies and algorithms were followed during the patient's care in the ED.  As part of my medical decision making, I reviewed the following data within the electronic MEDICAL RECORD NUMBER Nursing notes reviewed and incorporated, Labs reviewed, notes from prior ED visits and [place] Controlled Substance Database   ____________________________________________   FINAL CLINICAL IMPRESSION(S) / ED DIAGNOSES  Final diagnoses:  Weakness  Anterior shoulder  dislocation, left, initial encounter  Anterior shoulder dislocation, right, initial encounter      NEW MEDICATIONS STARTED DURING THIS VISIT:  New Prescriptions   No medications on file     Note:  This document was prepared using Dragon voice recognition software and may include unintentional dictation errors.    [name], MD 02/26/21 1924

## 2021-02-26 NOTE — ED Notes (Signed)
Critical lab   Trop 46  MD. Roxan Hockey advised .

## 2021-02-26 NOTE — ED Triage Notes (Signed)
Pt denies dizziness, denies LOC, denies hitting head. Pt A&O x4 at this time.

## 2021-02-26 NOTE — ED Provider Notes (Signed)
Reduction of dislocation  Date/Time: 02/26/2021 4:30 PM Performed by: Racheal Patches, PA-C Authorized by: Racheal Patches, PA-C  Consent: Verbal consent obtained. Written consent obtained. Risks and benefits: risks, benefits and alternatives were discussed Consent given by: patient Patient understanding: patient states understanding of the procedure being performed Patient consent: the patient's understanding of the procedure matches consent given Procedure consent: procedure consent matches procedure scheduled Imaging studies: imaging studies available Patient identity confirmed: verbally with patient and arm band Time out: Immediately prior to procedure a "time out" was called to verify the correct patient, procedure, equipment, support staff and site/side marked as required. Local anesthesia used: no  Anesthesia: Local anesthesia used: no  Sedation: Patient sedated: yes (Procedural sedation performed by MD. See Dr. Danella Penton note)  Patient tolerance: patient tolerated the procedure well with no immediate complications Comments: Patient had procedurally been sedated by attending MD.  Once sedation has been achieved, there is countertraction applied by myself to the right arm while in the performance reduction of the left shoulder.  With successful reduction of the left shoulder, I reduced the right shoulder by extending the shoulder joint, then externally rotating with palpation over the humeral head.  Good palpable reduction is performed.  Range of motion is performed while patient is still sedated with no evidence of subluxation or subsequent dislocation.  Patient has good visible and palpable reduction at bedside.  Post reduction films will be obtained.      Diana Roberson 02/26/21 1638    [name], MD 02/26/21 1715

## 2021-02-26 NOTE — ED Notes (Signed)
Family at bedside. 

## 2021-02-27 DIAGNOSIS — S43014A Anterior dislocation of right humerus, initial encounter: Secondary | ICD-10-CM | POA: Diagnosis not present

## 2021-02-27 DIAGNOSIS — S43006D Unspecified dislocation of unspecified shoulder joint, subsequent encounter: Secondary | ICD-10-CM | POA: Diagnosis not present

## 2021-02-27 DIAGNOSIS — S43015A Anterior dislocation of left humerus, initial encounter: Secondary | ICD-10-CM | POA: Diagnosis not present

## 2021-02-27 LAB — CBC
HCT: 30.5 % — ABNORMAL LOW (ref 36.0–46.0)
Hemoglobin: 10.6 g/dL — ABNORMAL LOW (ref 12.0–15.0)
MCH: 32.8 pg (ref 26.0–34.0)
MCHC: 34.8 g/dL (ref 30.0–36.0)
MCV: 94.4 fL (ref 80.0–100.0)
Platelets: 231 10*3/uL (ref 150–400)
RBC: 3.23 MIL/uL — ABNORMAL LOW (ref 3.87–5.11)
RDW: 15 % (ref 11.5–15.5)
WBC: 6.4 10*3/uL (ref 4.0–10.5)
nRBC: 0 % (ref 0.0–0.2)

## 2021-02-27 LAB — BASIC METABOLIC PANEL
Anion gap: 6 (ref 5–15)
BUN: 24 mg/dL — ABNORMAL HIGH (ref 8–23)
CO2: 27 mmol/L (ref 22–32)
Calcium: 9.1 mg/dL (ref 8.9–10.3)
Chloride: 104 mmol/L (ref 98–111)
Creatinine, Ser: 0.89 mg/dL (ref 0.44–1.00)
GFR, Estimated: >60 mL/min (ref >60)
Glucose, Bld: 171 mg/dL — ABNORMAL HIGH (ref 70–99)
Potassium: 4.3 mmol/L (ref 3.5–5.1)
Sodium: 137 mmol/L (ref 135–145)

## 2021-02-27 LAB — TSH: TSH: 1.482 u[IU]/mL (ref 0.350–4.500)

## 2021-02-27 LAB — TROPONIN I (HIGH SENSITIVITY): Troponin I (High Sensitivity): 32 ng/L — ABNORMAL HIGH (ref <18)

## 2021-02-27 MED ORDER — OXYCODONE HCL 5 MG PO TABS: 5.0000 mg | ORAL_TABLET | ORAL | Status: DC | PRN

## 2021-02-27 MED ORDER — MORPHINE SULFATE (PF) 2 MG/ML IV SOLN: 2.0000 mg | INTRAVENOUS | Status: DC | PRN

## 2021-02-27 MED ORDER — ZIPRASIDONE HCL 20 MG PO CAPS
20.0000 mg | ORAL_CAPSULE | Freq: Two times a day (BID) | ORAL | Status: DC
  Administered 2021-02-27: 20 mg via ORAL
  Filled 2021-02-27 (×2): qty 1

## 2021-02-27 NOTE — TOC Transition Note (Addendum)
Transition of Care [facility]) - Initial/Assessment Note    Patient Details  Name: Diana Roberson MRN: [id number] Date of Birth: [birth year]  [facility] CM/SW Contact:    Marina Goodell Phone Number: [phone] 02/27/2021, 11:35 AM  Clinical Narrative:                  Patient presents from home after fall.  CSW spoke with patient's daughter [name] [phone] and explained the role of TOC in patient care.  Patient will d/c home with home health. CSW went over the recommendations from PT for SNF and also the process and timeline for home health.  [name] stated her mother wanted to return home as soon as possible and opted for home health.  CSW stated that I would contact the different home health agencies and if [name] does not hear back from a home health agency by Wednesday 03/02/2021 to contact the patient's PCP for follow-up. Kim verbalized understanding.   CSW contacted the following home health agencies for PT,OT and [facility] services: [facility], [facility], [facility], [facility], [facility], [facility], [facility] and [facility]     Expected Discharge Plan: Home w Home Health Services Barriers to Discharge: ED No Barriers   Patient Goals and CMS Choice Patient states their goals for this hospitalization and ongoing recovery are:: Daughter woudl like patient to return home today      Expected Discharge Plan and Services Expected Discharge Plan: Home w Home Health Services In-house Referral: Clinical Social Work   Post Acute Care Choice: Home Health Living arrangements for the past 2 months: Single Family Home Expected Discharge Date: 02/27/21                                    Prior Living Arrangements/Services Living arrangements for the past 2 months: Single Family Home Lives with:: Adult Children Alphonzo Grieve (Daughter)   [phone] (Mobile)) Patient language and need for interpreter reviewed:: Yes Do you feel safe going back to the  place where you live?: Yes        Care giver support system in place?: Yes (comment)   Criminal Activity/Legal Involvement Pertinent to Current Situation/Hospitalization: No - Comment as needed  Activities of Daily Living      Permission Sought/Granted Permission sought to share information with : Facility Medical sales representative Permission granted to share information with : Yes, Verbal Permission Granted  Share Information with NAME: Simmons,Michele (Daughter)   [phone] (Mobile)           Emotional Assessment Appearance:: Appears stated age Attitude/Demeanor/Rapport: Gracious Affect (typically observed): Stable Orientation: : Oriented to Place, Oriented to Self, Oriented to  Time, Oriented to Situation Alcohol / Substance Use: Not Applicable Psych Involvement: No (comment)  Admission diagnosis:  Syncope [R55] Patient Active Problem List   Diagnosis Date Noted   Syncope 02/26/2021   Anemia 11/13/2020   Gait disorder 11/08/2020   OAB (overactive bladder) 07/19/2020   Essential hypertension, benign 01/15/2020   Gastroesophageal reflux disease 11/01/2017   Type 2 diabetes mellitus with circulatory disorder (HCC) 12/28/2015   Seropositive rheumatoid arthritis (HCC) 12/28/2015   Glaucoma 04/30/2015   Advance directive discussed with patient 04/30/2015   Routine general medical examination at a health care facility 12/06/2012   CAROTID BRUIT 11/08/2010   Bipolar 1 disorder, manic, full remission (HCC) 10/01/2009   OSTEOPENIA 07/26/2007   Hyperlipemia  01/22/2007   DIVERTICULOSIS, COLON 01/08/2007   Chronic back pain 01/08/2007   PCP:  Karie Schwalbe, MD Pharmacy:   [facility]) - [place], [place] - [address] [place], [address] [place], [address] [place] [address] Phone: [phone] Fax: [phone]     Social Determinants of Health (SDOH) Interventions    Readmission Risk Interventions No flowsheet data  found.

## 2021-02-27 NOTE — Discharge Summary (Signed)
Physician Discharge Summary  Diana Roberson XLK:[id number] DOB: 01/05/1946 DOA: 02/26/2021  PCP: Karie Schwalbe, MD  Admit date: 02/26/2021 Discharge date: 02/27/2021  Admitted From: Home Disposition: Home with home health  Recommendations for Outpatient Follow-up:  Follow up with PCP in 1-2 weeks Inquire with PCP about referral to orthopedics or sports medicine  Home Health: Yes Equipment/Devices: None  Discharge Condition: Stable CODE STATUS: Full Diet recommendation: Heart Healthy  Brief/Interim Summary: 75 y.o. female with medical history significant for hypertension, rheumatoid arthritis, seropositive, ataxia, bipolar 1 disorder, non-insulin-dependent diabetes mellitus, hyperlipidemia, osteoarthritis, history of cholecystectomy, history of diverticulosis, history of right elbow fracture status post ORIF in 06/24/2019, presents emergency department for chief concerns of a fall.   She was walking from kitchen to bathroom and 'suddenly, I fell'. She wanted to put something away in the bathroom. She reports initially the pain was a 10/10, constant, sharp, and now it is a 2/10 and worse with moving.    Prior to falling, she denies shortness of breath, chest pain, abdominal pain, vision changes including double vision, curtains coming down, dysuria, hematuria, diarrhea, changes to diet.   She reports that she probably drinks about 3 eight cups of water per day. She denies going outside recently in the last 3 days due to the heat.   She went to indoor walking track on 02/25/21 and walked about three labs. That was the first time she went to the walking track. She frequently walks around her neighborhood however stopped in the last three days due to it being too hot.   She denies loosing consciousness and states she was aware as she fell. She reports the floor in her home is hardwood and nothing was in her way. She denies shuffling gait and endorses dragging her feet at baseline.  On  presentation she was found to have bilateral shoulder dislocation without acute fracture.  Under conscious sedation the shoulder dislocations were reduced by ED provider.  Follow-up x-rays showed stability.  Patient's pain markedly improved afterwards.  After evaluation by therapies recommendation for skilled nursing facility was made.  I had a lengthy conversation with the patient as well as her daughter at bedside.  Daughter does not wish for the patient to go to a skilled nursing facility would rather bring the patient home.  After my discussion I did order home health services including physical therapy occupational therapy and aide.  I notified the week and ED case manager who will set up these home services for patient after discharge.  Discharge Diagnoses:  Principal Problem:   Syncope Active Problems:   Hyperlipemia   Bipolar 1 disorder, manic, full remission (HCC)   Seropositive rheumatoid arthritis (HCC)   Gastroesophageal reflux disease   Essential hypertension, benign  Bilateral shoulder dislocation status post mechanical fall Patient adamantly denies any evidence of syncope.  Echocardiogram can be deferred outpatient.  Shoulder dislocations reduced in the emergency room.  Initial recommendation was for skilled nursing facility however daughter and patient like to go home with home health services.  These have been ordered and case management has been involved.  Patient discharged home in stable condition.  No narcotic pain medication has been prescribed.  Patient has supply of gabapentin at home and she will take that if needed in combination with over-the-counter anti-inflammatories and Tylenol.  Discharge Instructions  Discharge Instructions     Diet - low sodium heart healthy   Complete by: As directed    Increase activity slowly   Complete  by: As directed       Allergies as of 02/27/2021       Reactions   Codeine Sulfate Other (See Comments)   Indigestion and epigastric  pain   Sertraline Hcl Other (See Comments)   REACTION: Worse, ? irritable        Medication List     TAKE these medications    aspirin EC 81 MG tablet Take 81 mg by mouth daily.   brimonidine 0.2 % ophthalmic solution Commonly known as: ALPHAGAN Place 1 drop into both eyes 2 (two) times daily.   cholecalciferol 1000 units tablet Commonly known as: VITAMIN D Take 1,000 Units by mouth daily.   dorzolamide-timolol 22.3-6.8 MG/ML ophthalmic solution Commonly known as: COSOPT Place 1 drop into both eyes 2 (two) times daily.   folic acid 1 MG tablet Commonly known as: FOLVITE Take 1 tablet (1 mg total) by mouth daily.   glucose blood test strip Commonly known as: OneTouch Verio Use to check blood sugar once a day. Dx Code E11.9   hydroxychloroquine 200 MG tablet Commonly known as: PLAQUENIL Take 200 mg by mouth daily.   lisinopril 10 MG tablet Commonly known as: ZESTRIL Take 1 tablet (10 mg total) by mouth daily.   methotrexate 2.5 MG tablet Commonly known as: RHEUMATREX Take 7 tablets (17.5 mg total) by mouth once a week. Caution:Chemotherapy. Protect from light.   OneTouch Delica Lancets 33G Misc 1 each by Does not apply route daily. Dx Code E11.9   QUEtiapine 25 MG tablet Commonly known as: SEROQUEL Take 0.5-1 tablets (12.5-25 mg total) by mouth at bedtime.   vitamin B-12 1000 MCG tablet Commonly known as: CYANOCOBALAMIN Take 1,000 mcg by mouth daily.   vitamin E 180 MG (400 UNITS) capsule Take 400 Units by mouth daily.   ziprasidone 20 MG capsule Commonly known as: GEODON Take 1 capsule (20 mg total) by mouth in the morning and at bedtime.        Follow-up Information     Karie Schwalbe, MD. Schedule an appointment as soon as possible for a visit in 1 week(s).   Specialties: Internal Medicine, Pediatrics Contact information: [address] [place] [address] [phone]                Allergies  Allergen Reactions    Codeine Sulfate Other (See Comments)    Indigestion and epigastric pain   Sertraline Hcl Other (See Comments)    REACTION: Worse, ? irritable    Consultations: None   Procedures/Studies: DG Shoulder 1 View Right  Result Date: 02/26/2021 CLINICAL DATA:  Post reduction EXAM: RIGHT SHOULDER - 1 VIEW COMPARISON:  02/26/2021 FINDINGS: Single view of the right shoulder reveal satisfactory reduction. No fracture seen on this projection. IMPRESSION: Satisfactory right shoulder reduction. Electronically Signed   By: Marlan Palau M.D.   On: 02/26/2021 16:35   DG Shoulder Right  Result Date: 02/26/2021 CLINICAL DATA:  Fall EXAM: RIGHT SHOULDER - 2+ VIEW COMPARISON:  None. FINDINGS: Anterior dislocation of the shoulder. No fracture. AC joint intact. IMPRESSION: Anterior shoulder dislocation. Electronically Signed   By: Marlan Palau M.D.   On: 02/26/2021 15:02   CT Head Wo Contrast  Result Date: 02/26/2021 CLINICAL DATA:  Unwitnessed fall. EXAM: CT HEAD WITHOUT CONTRAST TECHNIQUE: Contiguous axial images were obtained from the base of the skull through the vertex without intravenous contrast. COMPARISON:  December 25, 2020 FINDINGS: Brain: No evidence of acute infarction, hemorrhage, hydrocephalus, extra-axial collection or mass  lesion/mass effect. Moderate white matter small vessel disease. Vascular: Calcific atherosclerotic disease of the intra cavernous carotid arteries. Skull: Normal. Negative for fracture or focal lesion. Sinuses/Orbits: No acute finding. Other: None. IMPRESSION: No acute intracranial abnormality. Electronically Signed   By: Ted Mcalpine M.D.   On: 02/26/2021 19:09   DG Chest Port 1 View  Result Date: 02/26/2021 CLINICAL DATA:  Post fall. EXAM: PORTABLE CHEST 1 VIEW COMPARISON:  November 13, 2020 FINDINGS: Cardiomediastinal silhouette is normal. Mediastinal contours appear intact. 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 There is no evidence of focal  airspace consolidation, pleural effusion or pneumothorax. Osseous structures are without acute abnormality. Soft tissues are grossly normal. IMPRESSION: 1. No active disease. 2. 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 Electronically Signed   By: Ted Mcalpine M.D.   On: 02/26/2021 20:10   DG Shoulder Left  Result Date: 02/26/2021 CLINICAL DATA:  Fall.  Injury EXAM: LEFT SHOULDER - 2+ VIEW COMPARISON:  None. FINDINGS: Anterior dislocation of the shoulder. Possible Hill-Sachs deformity. No other fracture. IMPRESSION: Anterior dislocation of shoulder. Possible Hill-Sachs deformity of the humerus. Electronically Signed   By: Marlan Palau M.D.   On: 02/26/2021 15:00   DG Shoulder Left Port  Result Date: 02/26/2021 CLINICAL DATA:  Post reduction EXAM: LEFT SHOULDER COMPARISON:  02/26/2021 FINDINGS: Satisfactory left shoulder reduction.  Hill-Sachs deformity. IMPRESSION: Satisfactory shoulder reduction. Hill-Sachs deformity of the humerus. Electronically Signed   By: Marlan Palau M.D.   On: 02/26/2021 16:39   (Echo, Carotid, EGD, Colonoscopy, ERCP)    Subjective: Seen and examined on the day of discharge.  Stable, no distress.  Pain controlled after shoulder dislocation reduction.  Stable for discharge home.  Discharge Exam: Vitals:   02/27/21 0600 02/27/21 0917  BP: (!) 150/89   Pulse: 83   Resp: 16   Temp:    SpO2: 98% 100%   Vitals:   02/27/21 0045 02/27/21 0209 02/27/21 0600 02/27/21 0917  BP:  (!) 158/105 (!) 150/89   Pulse: 83 78 83   Resp:  16 16   Temp:      TempSrc:      SpO2: 100% 100% 98% 100%  Weight:      Height:        General: Pt is alert, awake, not in acute distress Cardiovascular: RRR, S1/S2 +, no rubs, no gallops Respiratory: CTA bilaterally, no wheezing, no rhonchi Abdominal: Soft, NT, ND, bowel sounds + Extremities: no edema, no cyanosis    The results of significant diagnostics from this hospitalization (including imaging,  microbiology, ancillary and laboratory) are listed below for reference.     Microbiology: Recent Results (from the past 240 hour(s))  Resp Panel by RT-PCR (Flu A&B, Covid) Nasopharyngeal Swab     Status: None   Collection Time: 02/26/21  6:08 PM   Specimen: Nasopharyngeal Swab; Nasopharyngeal(NP) swabs in vial transport medium  Result Value Ref Range Status   SARS Coronavirus 2 by RT PCR NEGATIVE NEGATIVE Final    Comment: (NOTE) SARS-CoV-2 target nucleic acids are NOT DETECTED.  The SARS-CoV-2 RNA is generally detectable in upper respiratory specimens during the acute phase of infection. The lowest concentration of SARS-CoV-2 viral copies this assay can detect is 138 copies/mL. A negative result does not preclude SARS-Cov-2 infection and should not be used as the sole basis for treatment or other patient management decisions. A negative result may occur with  improper specimen collection/handling, submission of specimen other than nasopharyngeal swab, presence of  viral mutation(s) within the areas targeted by this assay, and inadequate number of viral copies(<138 copies/mL). A negative result must be combined with clinical observations, patient history, and epidemiological information. The expected result is Negative.  Fact Sheet for Patients:  BloggerCourse.com  Fact Sheet for Healthcare Providers:  SeriousBroker.it  This test is no t yet approved or cleared by the [place] FDA and  has been authorized for detection and/or diagnosis of SARS-CoV-2 by FDA under an Emergency Use Authorization (EUA). This EUA will remain  in effect (meaning this test can be used) for the duration of the COVID-19 declaration under Section 564(b)(1) of the Act, 21 U.S.C.section 360bbb-3(b)(1), unless the authorization is terminated  or revoked sooner.       Influenza A by PCR NEGATIVE NEGATIVE Final   Influenza B by PCR NEGATIVE NEGATIVE  Final    Comment: (NOTE) The Xpert Xpress SARS-CoV-2/FLU/RSV plus assay is intended as an aid in the diagnosis of influenza from Nasopharyngeal swab specimens and should not be used as a sole basis for treatment. Nasal washings and aspirates are unacceptable for Xpert Xpress SARS-CoV-2/FLU/RSV testing.  Fact Sheet for Patients: BloggerCourse.com  Fact Sheet for Healthcare Providers: SeriousBroker.it  This test is not yet approved or cleared by the [place] FDA and has been authorized for detection and/or diagnosis of SARS-CoV-2 by FDA under an Emergency Use Authorization (EUA). This EUA will remain in effect (meaning this test can be used) for the duration of the COVID-19 declaration under Section 564(b)(1) of the Act, 21 U.S.C. section 360bbb-3(b)(1), unless the authorization is terminated or revoked.  Performed at [facility], [address] [place], [place] [address]      Labs: BNP (last 3 results) No results for input(s): BNP in the last 8760 hours. Basic Metabolic Panel: Recent Labs  Lab 02/26/21 1412 02/27/21 0800  NA 133* 137  K 3.9 4.3  CL 103 104  CO2 20* 27  GLUCOSE 249* 171*  BUN 29* 24*  CREATININE 0.97 0.89  CALCIUM 9.5 9.1   Liver Function Tests: Recent Labs  Lab 02/26/21 1412  AST 33  ALT 24  ALKPHOS 89  BILITOT 1.0  PROT 7.6  ALBUMIN 4.2   No results for input(s): LIPASE, AMYLASE in the last 168 hours. No results for input(s): AMMONIA in the last 168 hours. CBC: Recent Labs  Lab 02/26/21 1412 02/27/21 0800  WBC 14.0* 6.4  NEUTROABS 11.2*  --   HGB 12.0 10.6*  HCT 35.3* 30.5*  MCV 97.5 94.4  PLT 311 231   Cardiac Enzymes: No results for input(s): CKTOTAL, CKMB, CKMBINDEX, TROPONINI in the last 168 hours. BNP: Invalid input(s): POCBNP CBG: No results for input(s): GLUCAP in the last 168 hours. D-Dimer No results for input(s): DDIMER in the last 72 hours. Hgb  A1c No results for input(s): HGBA1C in the last 72 hours. Lipid Profile No results for input(s): CHOL, HDL, LDLCALC, TRIG, CHOLHDL, LDLDIRECT in the last 72 hours. Thyroid function studies Recent Labs    02/27/21 0800  TSH 1.482   Anemia work up No results for input(s): VITAMINB12, FOLATE, FERRITIN, TIBC, IRON, RETICCTPCT in the last 72 hours. Urinalysis    Component Value Date/Time   COLORURINE YELLOW (A) 02/26/2021 1412   APPEARANCEUR CLEAR (A) 02/26/2021 1412   LABSPEC 1.014 02/26/2021 1412   PHURINE 6.0 02/26/2021 1412   GLUCOSEU 150 (A) 02/26/2021 1412   HGBUR NEGATIVE 02/26/2021 1412   HGBUR small 09/19/2007 1215   BILIRUBINUR NEGATIVE 02/26/2021 1412  BILIRUBINUR Negative 02/04/2019 1452   KETONESUR NEGATIVE 02/26/2021 1412   PROTEINUR 100 (A) 02/26/2021 1412   UROBILINOGEN 0.2 02/04/2019 1452   UROBILINOGEN 0.2 09/19/2007 1215   NITRITE NEGATIVE 02/26/2021 1412   LEUKOCYTESUR NEGATIVE 02/26/2021 1412   Sepsis Labs Invalid input(s): PROCALCITONIN,  WBC,  LACTICIDVEN Microbiology Recent Results (from the past 240 hour(s))  Resp Panel by RT-PCR (Flu A&B, Covid) Nasopharyngeal Swab     Status: None   Collection Time: 02/26/21  6:08 PM   Specimen: Nasopharyngeal Swab; Nasopharyngeal(NP) swabs in vial transport medium  Result Value Ref Range Status   SARS Coronavirus 2 by RT PCR NEGATIVE NEGATIVE Final    Comment: (NOTE) SARS-CoV-2 target nucleic acids are NOT DETECTED.  The SARS-CoV-2 RNA is generally detectable in upper respiratory specimens during the acute phase of infection. The lowest concentration of SARS-CoV-2 viral copies this assay can detect is 138 copies/mL. A negative result does not preclude SARS-Cov-2 infection and should not be used as the sole basis for treatment or other patient management decisions. A negative result may occur with  improper specimen collection/handling, submission of specimen other than nasopharyngeal swab, presence of viral  mutation(s) within the areas targeted by this assay, and inadequate number of viral copies(<138 copies/mL). A negative result must be combined with clinical observations, patient history, and epidemiological information. The expected result is Negative.  Fact Sheet for Patients:  BloggerCourse.com  Fact Sheet for Healthcare Providers:  SeriousBroker.it  This test is no t yet approved or cleared by the [place] FDA and  has been authorized for detection and/or diagnosis of SARS-CoV-2 by FDA under an Emergency Use Authorization (EUA). This EUA will remain  in effect (meaning this test can be used) for the duration of the COVID-19 declaration under Section 564(b)(1) of the Act, 21 U.S.C.section 360bbb-3(b)(1), unless the authorization is terminated  or revoked sooner.       Influenza A by PCR NEGATIVE NEGATIVE Final   Influenza B by PCR NEGATIVE NEGATIVE Final    Comment: (NOTE) The Xpert Xpress SARS-CoV-2/FLU/RSV plus assay is intended as an aid in the diagnosis of influenza from Nasopharyngeal swab specimens and should not be used as a sole basis for treatment. Nasal washings and aspirates are unacceptable for Xpert Xpress SARS-CoV-2/FLU/RSV testing.  Fact Sheet for Patients: BloggerCourse.com  Fact Sheet for Healthcare Providers: SeriousBroker.it  This test is not yet approved or cleared by the [place] FDA and has been authorized for detection and/or diagnosis of SARS-CoV-2 by FDA under an Emergency Use Authorization (EUA). This EUA will remain in effect (meaning this test can be used) for the duration of the COVID-19 declaration under Section 564(b)(1) of the Act, 21 U.S.C. section 360bbb-3(b)(1), unless the authorization is terminated or revoked.  Performed at [facility], [address] [place], [place] [address]      Time coordinating  discharge: Over 30 minutes  SIGNED:   Tresa Moore, MD  [facility] 02/27/2021, 11:41 AM Pager   If 7PM-7AM, please contact night-coverage

## 2021-02-27 NOTE — Evaluation (Signed)
Occupational Therapy Evaluation Patient Details Name: Diana Roberson MRN: [id number] DOB: 07/01/1946 Today's Date: 02/27/2021    History of Present Illness 75 y/o female presents to the ED with reports of a fall while walking from her kitchen to her bathroom on 02/26/21. Medical history includes: HTN, rheumatoid arthritis, seropositive, ataxia, bipolar disorder, non-insuling dependent DM, hyperlipidemia, OA, history of cholecystectomy, history of R elbow fracture (s/p ORIF 06/24/19). At baseline, pt ambulating with no assistive device and completing dressing activities modI with assistance required for bathing, cleaning and cooking.   Clinical Impression   Pt received in ED, daughter Cala Bradford present. Pt denies pain and visual changes at present. Removed b/l UE slings, pt's UE ROM WFL and w/o pain. Pt appears back to baseline level of function. Recommend DC to home, discontinuation of slings. Pt eager to get back to the day program she attends Mon-Fri 10-4, where she participates in programming to strengthen and improve ROM in UE. No additional OT services needed at this time. Please re-consult should additional needs arise.     Follow Up Recommendations  No OT follow up    Equipment Recommendations  None recommended by OT    Recommendations for Other Services       Precautions / Restrictions Precautions Precautions: Fall Restrictions Weight Bearing Restrictions: Yes RUE Weight Bearing: Weight bearing as tolerated LUE Weight Bearing: Weight bearing as tolerated     Mobility Bed Mobility Overal bed mobility: Needs Assistance Bed Mobility: Supine to Sit;Sit to Supine     Supine to sit: Min assist Sit to supine: Modified independent (Device/Increase time)   General bed mobility comments: Pt able to perform bed mobility with increased time/effort, and MinA, 2/2 b/l UE slings. After removing slings, pt able to perform bed mobility with Mod I.    Transfers Overall transfer  level: Needs assistance Equipment used: None Transfers: Sit to/from Stand Sit to Stand: Min assist         General transfer comment: Pt able to complete transfer with modA from therapist requiring elevated surface and minA from therapist to complete transfer, she had increased trunk excursion when coming to the standing position, but no LOB    Balance Overall balance assessment: Needs assistance Sitting-balance support: No upper extremity supported;Feet supported Sitting balance-Leahy Scale: Good Sitting balance - Comments: Pt seated EOB with BIL LE supported safely     Standing balance-Leahy Scale: Good Standing balance comment: Pt with increased trunk sway in the standing position, but overall able to maintain her balance without limitations                           ADL either performed or assessed with clinical judgement   ADL Overall ADL's : Needs assistance/impaired Eating/Feeding: Minimal assistance                                           Vision Patient Visual Report: No change from baseline Additional Comments: had vision changes immediately post fall -- now resolved     Perception     Praxis      Pertinent Vitals/Pain Pain Assessment: No/denies pain     Hand Dominance     Extremity/Trunk Assessment Upper Extremity Assessment Upper Extremity Assessment: Generalized weakness   Lower Extremity Assessment Lower Extremity Assessment: Generalized weakness;Overall [facility] for tasks assessed   Cervical / Trunk Assessment  Cervical / Trunk Assessment: Kyphotic   Communication Communication Communication: No difficulties   Cognition Arousal/Alertness: Awake/alert Behavior During Therapy: WFL for tasks assessed/performed Overall Cognitive Status: Within Functional Limits for tasks assessed                                 General Comments: Pt alert and oriented to situation, pleasant and eager to get out of the ED    General Comments  large bruise on chin    Exercises Other Exercises Other Exercises: Educated pt on fall preventions strategies, including slow position change Other Exercises: Educ on falls prevention, HEP, home/routines modifications   Shoulder Instructions      Home Living Family/patient expects to be discharged to:: Private residence Living Arrangements: Alone;Other (Comment) Available Help at Discharge: Family;Other (Comment) (day program, 10 AM-4PM Mon-Fri) Type of Home: House Home Access: Stairs to enter [employer] of Steps: 3 Entrance Stairs-Rails: None Home Layout: One level     Bathroom Shower/Tub: Walk-in shower         Home Equipment: Environmental consultant - 2 wheels;Shower seat;Grab bars - toilet;Grab bars - tub/shower   Additional Comments: Pt has access to a rollator, but her dtr reports that she is unable to appropriately use walker 2/2 instability and would be better off using a walker that didnt have wheels      Prior Functioning/Environment Level of Independence: Needs assistance  Gait / Transfers Assistance Needed: Pt I with transfers from elevated recliner at home and able to ambulate in home without [place] of AD ADL's / Homemaking Assistance Needed: Received assistance for ADLs, cooking and cleaning from hired help and family            OT Problem List: Decreased strength;Decreased range of motion;Decreased activity tolerance;Impaired balance (sitting and/or standing)      OT Treatment/Interventions:      OT Goals(Current goals can be found in the care plan section) Acute Rehab OT Goals Patient Stated Goal: to get home and back to day program OT Goal Formulation: With patient Time For Goal Achievement: 03/13/21  OT Frequency:     Barriers to D/C:            Co-evaluation              AM-PAC OT "6 Clicks" Daily Activity     Outcome Measure Help from another person eating meals?: A Little Help from another person taking care of personal  grooming?: A Little Help from another person toileting, which includes using toliet, bedpan, or urinal?: A Little Help from another person bathing (including washing, rinsing, drying)?: A Lot Help from another person to put on and taking off regular upper body clothing?: A Little Help from another person to put on and taking off regular lower body clothing?: A Little 6 Click Score: 17   End of Session    Activity Tolerance: Patient tolerated treatment well Patient left: in bed;with family/visitor present;with call bell/phone within reach  OT Visit Diagnosis: Unsteadiness on feet (R26.81);Other abnormalities of gait and mobility (R26.89);Muscle weakness (generalized) (M62.81);Feeding difficulties (R63.3);History of falling (Z91.81)                Time: 2119-4174 OT Time Calculation (min): 25 min Charges:  OT General Charges $OT Visit: 1 Visit OT Evaluation $OT Eval Moderate Complexity: 1 Mod OT Treatments $Self Care/Home Management : 23-37 mins Latina Craver, PhD, MS, OTR/L 02/27/21, 12:16 PM

## 2021-02-27 NOTE — ED Notes (Signed)
Daughter kim chambers would like to have social work assess pt and get her into rehab as soon as possible. Unsure why mother hast to stay here. Would like to be notified [phone]

## 2021-02-27 NOTE — Evaluation (Addendum)
Physical Therapy Evaluation Patient Details Name: Diana Roberson MRN: [id number] DOB: 1945-10-30 Today's Date: 02/27/2021   History of Present Illness  75 y/o female presents to the ED with reports of a fall while walking from her kitchen to her bathroom on 02/26/21. Medical history includes: HTN, rheumatoid arthritis, seropositive, ataxia, bipolar disorder, non-insuling dependent DM, hyperlipidemia, OA, history of cholecystectomy, history of R elbow fracture (s/p ORIF 06/24/19). At baseline, pt ambulating with no assistive device and completing dressing activities modI with assistance required for bathing, cleaning and cooking.  Clinical Impression  Patient received by PT supine in the bed with daughter, [name] at bedside throughout PT eval. Patient eager to get out of the hospital. Pt with BIL UE in slings, completed transfers and gait with slings donned. Completed supine<>sit transfer with modA from therapist to assist with trunk flexion, 2/2 to decreased UE mobility and strength. Pt with increased trunk excursion seated EOB and when completing sit<>stand transfer. Completed ambulation with no AD and minA from therapist to decrease falls risk. Pt provided Vcs to avoid dragging RLE, pt able to make appropriate changes to gait following Vcs. Pt's main limitation is her decreased ability to utilize her UE to maintain her balance when in the upright position and during transfer. Recommend HHPT to assist with pt improve her gait, balance and strength to her prior level of function.     Follow Up Recommendations Home Health PT  Patient will benefit from ongoing skilled PT services in home health setting to continue to advance safe functional mobility, address ongoing impairments in balance, LE strength and minimize fall risk.    Equipment Recommendations  Other (comment) (RW)    Recommendations for Other Services       Precautions / Restrictions Precautions Precautions: Fall Required Braces or  Orthoses: Sling Restrictions Weight Bearing Restrictions: Yes RUE Weight Bearing: Weight bearing as tolerated LUE Weight Bearing: Weight bearing as tolerated Other Position/Activity Restrictions: BIL UE in slings upon arrival and maintained while completing ambulation      Mobility  Bed Mobility Overal bed mobility: Needs Assistance Bed Mobility: Supine to Sit     Supine to sit: Mod assist     General bed mobility comments: Pt requiring mod A for bed mobility 2/2 BIL UE placed in slings Patient Response: Cooperative  Transfers Overall transfer level: Needs assistance Equipment used: None             General transfer comment: Pt able to complete transfer with modA from therapist requiring elevated surface and minA from therapist to complete transfer, she had increased trunk excursion when coming to the standing position, but no LOB  Ambulation/Gait Ambulation/Gait assistance: Min guard Gait Distance (Feet): 36 Feet Assistive device: None Gait Pattern/deviations: Shuffle Gait velocity: Decreased   General Gait Details: Pt tends to drag her R foot slightly and shuffle during gait. Pt unable to demonstrate appropriate arm swing during ambulation today 2/2 BIL UE slings causing pt slight increase in trunk sway to compensate.  Stairs            Wheelchair Mobility    Modified Rankin (Stroke Patients Only)       Balance Overall balance assessment: Needs assistance Sitting-balance support: No upper extremity supported;Feet supported Sitting balance-Leahy Scale: Good Sitting balance - Comments: Pt seated EOB with BIL LE supported safely     Standing balance-Leahy Scale: Fair Standing balance comment: Pt with increased trunk sway in the satnding position, but overall able to maintain her balance without limitations  Pertinent Vitals/Pain Pain Assessment: No/denies pain    Home Living Family/patient expects to be  discharged to:: Private residence Living Arrangements: Alone;Other (Comment) (Pt receives assistance from her two dtrs, granddaughter and hired care. She has caregiver assistance for ~80% of her days, but 24/7 is not an option currently.) Available Help at Discharge: Family;Other (Comment) (Granddaughter, and hired care giver assistance) Type of Home: House Home Access: Stairs to enter Entrance Stairs-Rails: None Entrance Stairs-Number of Steps: 3 Home Layout: One level Home Equipment: Environmental consultant - 2 wheels Additional Comments: Pt has access to a rollator, but her dtr reports that she is unable to appropriately use walker 2/2 instability and would be better off using a walker that didnt have wheels    Prior Function Level of Independence: Needs assistance   Gait / Transfers Assistance Needed: Pt I with transfers from elevated recliner at home and able to ambulate in home without [place] of AD  ADL's / Homemaking Assistance Needed: Received assistance for ADLs, cooking and cleaning from hired help and family        Hand Dominance        Extremity/Trunk Assessment   Upper Extremity Assessment Upper Extremity Assessment: Defer to OT evaluation    Lower Extremity Assessment Lower Extremity Assessment: Overall WFL for tasks assessed;Generalized weakness    Cervical / Trunk Assessment Cervical / Trunk Assessment: Kyphotic  Communication   Communication: No difficulties  Cognition Arousal/Alertness: Awake/alert Behavior During Therapy: WFL for tasks assessed/performed Overall Cognitive Status: Within Functional Limits for tasks assessed                                 General Comments: Pt alert and oriented to situation, pleasant and eager to get out of the ED      General Comments      Exercises Other Exercises Other Exercises: Educated pt on fall preventions strategies, including slow position change   Assessment/Plan    PT Assessment Patient needs continued PT  services  PT Problem List Decreased strength;Decreased mobility;Decreased safety awareness;Decreased activity tolerance;Decreased balance       PT Treatment Interventions DME instruction;Therapeutic activities;Gait training;Therapeutic exercise;Patient/family education;Stair training;Functional mobility training;Balance training    PT Goals (Current goals can be found in the Care Plan section)  Acute Rehab PT Goals Patient Stated Goal: No falls PT Goal Formulation: With patient/family Time For Goal Achievement: 03/13/21 Potential to Achieve Goals: Good    Frequency Min 2X/week   Barriers to discharge Decreased caregiver support Pt does not have 24/7 supervision    Co-evaluation               AM-PAC PT "6 Clicks" Mobility  Outcome Measure Help needed turning from your back to your side while in a flat bed without using bedrails?: A Little Help needed moving from lying on your back to sitting on the side of a flat bed without using bedrails?: A Lot Help needed moving to and from a bed to a chair (including a wheelchair)?: A Little Help needed standing up from a chair using your arms (e.g., wheelchair or bedside chair)?: A Little Help needed to walk in hospital room?: A Little Help needed climbing 3-5 steps with a railing? : A Lot 6 Click Score: 16    End of Session Equipment Utilized During Treatment: Gait belt Activity Tolerance: Patient tolerated treatment well Patient left: in bed;with family/visitor present Nurse Communication: Weight bearing status (WBAT BIL UE -  sent message in secure chat to MD and RN) PT Visit Diagnosis: Unsteadiness on feet (R26.81);History of falling (Z91.81);Repeated falls (R29.6)    Time: 7340-3709 PT Time Calculation (min) (ACUTE ONLY): 24 min   Charges:   PT Evaluation $PT Eval Low Complexity: 1 Low PT Treatments $Gait Training: 8-22 mins        Minette Headland, PT, DPT 02/27/21, 9:50 AM   Devoria Albe 02/27/2021,  9:43 AM

## 2021-03-01 ENCOUNTER — Telehealth: Payer: Self-pay

## 2021-03-01 NOTE — Chronic Care Management (AMB) (Addendum)
    Chronic Care Management Pharmacy Assistant   Name: Diana Roberson  MRN: [id number] DOB: 1946-01-09  Reason for Encounter: CCM Reminder Call   Recent office visits:  02/21/21 [name], PCP - Stop metformin due to side effects 01/19/21 - Dr. Alphonsus Sias, PCP Telemedicine - Decreased Ziprasidone to 20 mg BID  Recent consult visit: 02/26/21 thru 02/27/21  [facility] - ED fall bilateral shoulder dislocations  Hospital visits:  ED Visit- 12/26/20- Confusion ED visit 12/25/20- Anxiety  Medications: Outpatient Encounter Medications as of 03/01/2021  Medication Sig Note   aspirin EC 81 MG tablet Take 81 mg by mouth daily.    brimonidine (ALPHAGAN) 0.2 % ophthalmic solution Place 1 drop into both eyes 2 (two) times daily.    cholecalciferol (VITAMIN D) 1000 UNITS tablet Take 1,000 Units by mouth daily.    dorzolamide-timolol (COSOPT) 22.3-6.8 MG/ML ophthalmic solution Place 1 drop into both eyes 2 (two) times daily.    folic acid (FOLVITE) 1 MG tablet Take 1 tablet (1 mg total) by mouth daily.    glucose blood (ONETOUCH VERIO) test strip Use to check blood sugar once a day. Dx Code E11.9    hydroxychloroquine (PLAQUENIL) 200 MG tablet Take 200 mg by mouth daily.    lisinopril (ZESTRIL) 10 MG tablet Take 1 tablet (10 mg total) by mouth daily.    methotrexate (RHEUMATREX) 2.5 MG tablet Take 7 tablets (17.5 mg total) by mouth once a week. Caution:Chemotherapy. Protect from light. 02/27/2021: Friday    OneTouch Delica Lancets 33G MISC 1 each by Does not apply route daily. Dx Code E11.9    QUEtiapine (SEROQUEL) 25 MG tablet Take 0.5-1 tablets (12.5-25 mg total) by mouth at bedtime.    vitamin B-12 (CYANOCOBALAMIN) 1000 MCG tablet Take 1,000 mcg by mouth daily.    vitamin E 400 UNIT capsule Take 400 Units by mouth daily.    ziprasidone (GEODON) 20 MG capsule Take 1 capsule (20 mg total) by mouth in the morning and at bedtime.    No facility-administered encounter medications on file as of 03/01/2021.    Diana Roberson was contacted to remind her of her upcoming telephone visit with Phil Dopp on 03/07/2021 at 9:00 AM.  She was reminded to have all medications, supplements and any blood glucose and blood pressure readings available for review at appointment.  Are you having any problems with your medications? No  What concerns would you like to discuss with the pharmacist? The patient just had bilateral shoulder dislocations and is awaiting assistance in the home with therapy and some home care.  Star Rating Drugs: Medication:  Last Fill: Day Supply Lisinopril 10 mg           11/08/20            90  PCP appointment on 08/25/2021  and CCM appointment on 03/07/2021  For appointment call the daughter [name] at [phone] she assists with all appointments.  Phil Dopp, CPP notified  Burt Knack, [facility] Clinical Pharmacy Assistant [phone] I have reviewed the care management and care coordination activities outlined in this encounter and I am certifying that I agree with the content of this note. No further action required.  Phil Dopp, PharmD Clinical Pharmacist [facility] [phone]

## 2021-03-03 ENCOUNTER — Telehealth: Payer: Self-pay

## 2021-03-03 NOTE — Telephone Encounter (Signed)
Spoke to daughter, [name]. Social Worker from [facility] willnot call her back. We made an appt here 03-08-21. Will discuss in home PT vs OutPt PT.

## 2021-03-03 NOTE — Telephone Encounter (Signed)
Kim (do not see DPR signed for this daughter) said that pt fell on 02/26/21 and was taken to [facility] ED; [name] said pt had both shoulders dislocated; was unable to admit pt due to 17 people waiting for admission to [facility]. Pt went home because pt did not want to go into a rehab facility. [name] was advised that pt could get [facility] thru the PCP. Pt does not have FU ED appt with DR Alphonsus Sias. [facility] to senior center and was playing a game and then started with more pain in rt shoulder. [name] said someone is going to get sling to immobilize pt's shoulder and hopes Dr Alphonsus Sias can start [facility]. I was going to get more info but [name] had to take a call and would wait for cb after note reviewed by Dr Alphonsus Sias.

## 2021-03-07 ENCOUNTER — Other Ambulatory Visit: Payer: Self-pay

## 2021-03-07 ENCOUNTER — Ambulatory Visit (INDEPENDENT_AMBULATORY_CARE_PROVIDER_SITE_OTHER): Payer: Medicare Other

## 2021-03-07 ENCOUNTER — Emergency Department
Admission: EM | Admit: 2021-03-07 | Discharge: 2021-03-07 | Disposition: A | Discharge status: Home / Self Care | Payer: Medicare Other | Attending: Emergency Medicine | Admitting: Emergency Medicine

## 2021-03-07 ENCOUNTER — Emergency Department: Payer: Medicare Other

## 2021-03-07 ENCOUNTER — Encounter: Payer: Self-pay | Admitting: Emergency Medicine

## 2021-03-07 ENCOUNTER — Telehealth: Payer: Self-pay

## 2021-03-07 DIAGNOSIS — I10 Essential (primary) hypertension: Secondary | ICD-10-CM | POA: Insufficient documentation

## 2021-03-07 DIAGNOSIS — E1159 Type 2 diabetes mellitus with other circulatory complications: Secondary | ICD-10-CM | POA: Diagnosis not present

## 2021-03-07 DIAGNOSIS — S52502A Unspecified fracture of the lower end of left radius, initial encounter for closed fracture: Secondary | ICD-10-CM | POA: Insufficient documentation

## 2021-03-07 DIAGNOSIS — R2232 Localized swelling, mass and lump, left upper limb: Secondary | ICD-10-CM | POA: Diagnosis not present

## 2021-03-07 DIAGNOSIS — S52592A Other fractures of lower end of left radius, initial encounter for closed fracture: Secondary | ICD-10-CM | POA: Diagnosis not present

## 2021-03-07 DIAGNOSIS — S62102A Fracture of unspecified carpal bone, left wrist, initial encounter for closed fracture: Secondary | ICD-10-CM

## 2021-03-07 DIAGNOSIS — W19XXXA Unspecified fall, initial encounter: Secondary | ICD-10-CM

## 2021-03-07 DIAGNOSIS — E119 Type 2 diabetes mellitus without complications: Secondary | ICD-10-CM | POA: Diagnosis not present

## 2021-03-07 DIAGNOSIS — S6992XA Unspecified injury of left wrist, hand and finger(s), initial encounter: Secondary | ICD-10-CM | POA: Diagnosis present

## 2021-03-07 DIAGNOSIS — W1839XA Other fall on same level, initial encounter: Secondary | ICD-10-CM | POA: Insufficient documentation

## 2021-03-07 DIAGNOSIS — Z7982 Long term (current) use of aspirin: Secondary | ICD-10-CM | POA: Diagnosis not present

## 2021-03-07 DIAGNOSIS — Z79899 Other long term (current) drug therapy: Secondary | ICD-10-CM | POA: Diagnosis not present

## 2021-03-07 MED ORDER — HYDROCODONE-ACETAMINOPHEN 5-325 MG PO TABS
1.0000 | ORAL_TABLET | Freq: Once | ORAL | Status: AC
Start: 2021-03-07 — End: 2021-03-07
  Administered 2021-03-07: 1 via ORAL
  Filled 2021-03-07: qty 1

## 2021-03-07 MED ORDER — HYDROCODONE-ACETAMINOPHEN 5-325 MG PO TABS: 1.0000 | ORAL_TABLET | Freq: Four times a day (QID) | ORAL | 0 refills | Status: AC | PRN

## 2021-03-07 NOTE — Patient Instructions (Signed)
Dear Diana Roberson and family,  Below is a summary of the items we discussed during our follow up appointment on March 07, 2021. Please contact me anytime with questions or concerns.   Visit Information  Patient Care Plan: CCM Pharmacy Care Plan     Problem Identified: [facility] AMB "PATIENT-SPECIFIC PROBLEM"      Long-Range Goal: Disease Management   Start Date: 11/05/2020  This Visit's Progress: On track  Priority: High  Note:   Current Barriers:  Frequent falls  Pharmacist Clinical Goal(s):  Over the next 30 days, patient will achieve control of diabetes as evidenced by fasting BG < 130 through collaboration with PharmD and provider.   Interventions: 1:1 collaboration with Karie Schwalbe, MD regarding development and update of comprehensive plan of care as evidenced by provider attestation and co-signature Inter-disciplinary care team collaboration (see longitudinal plan of care) Comprehensive medication review performed; medication list updated in electronic medical record  Diabetes (A1c goal <7%) -Controlled - A1c 5.7% 02/2021 -Current medications: None -Medications previously tried: metformin, glipizide -Current home glucose readings - not sure frequency of home checks fasting glucose: around 140s post prandial glucose: none -No longer on statin - daughter took her off it. She did not want her on a statin.  -Denies hypoglycemic/hyperglycemic symptoms - denies any low BG < 70 -Recommend continue off medications  Hypertension (BP goal <140/90) -Controlled- per clinic readings  -Current treatment: Lisinopril 10 mg - 1 tablet daily -Medications previously tried: none -Current home readings: none reported today -She does have frequent falls but this does not seem to related to her BP at this time.  -Counseled to monitor BP at home with symptoms of dizziness/falls, document, and provide log at future appointments -Recommended to continue current medication  Health  Maintenance -Patient is still having frequent falls, seem to be mechanical? Daughter reports she is doing better off metformin (diarrhea) and lower dose Geodon (less groggy, sleeping better) -Gaps - Last DEXA 2021 considered inaccurate. Prior 2017 normal.  -Current therapy:  Vitamin D3 1000 IU - 1 capsule daily -Recommended consider repeat DEXA scan if patient would be interested in treatment. Recommend d/c aspirin due to frequent falls and age > 55. No history of ASCVD.   Patient Goals/Self-Care Activities Over the next 30 days, patient will:  - take medications as prescribed   Follow Up Plan: The care management team will reach out to the patient again over the next 90 days.       Patient verbalizes understanding of instructions provided today and agrees to view in MyChart.   Phil Dopp, PharmD Clinical Pharmacist [facility] [phone]

## 2021-03-07 NOTE — ED Triage Notes (Signed)
Pt to ED via POV, states fell when reaching down to pick up a cookie and injured L wrist. Pt with noted swelling to L wrist at this time.

## 2021-03-07 NOTE — Progress Notes (Signed)
Chronic Care Management Pharmacy Note  03/07/2021 Name:  Diana Roberson MRN:  [id number] DOB:  Jan 17, 1946  Summary: Patient fell this morning, wrist fracture. She is on vitamin D supplementation. She is not supplementing calcium.  She is due for a repeat DEXA. Her medications have been reviewed for contribution to falls and Geodon recently reduced. She denies hypoglycemia and is off all diabetes meds. BG 140s.  Recommendations: Would consider d/c aspirin with frequent falls. Update dexa if patient agreeable. Consider checking vitamin D level.  Plan: CCM f/u 3 months; PCP visit tomorrow  Subjective: Diana Roberson is an 75 y.o. year old female who is a primary patient of [place], Berneda Rose, MD.  The CCM team was consulted for assistance with disease management and care coordination needs.    Engaged with patient by telephone (spoke with Diana Roberson, patient's daughter) for follow up visit in response to provider referral for pharmacy case management and/or care coordination services.   Patient fell and broke her wrist last night when she got out of bed for a snack. We discussed purpose of CCM call. We spoke briefly as Diana Roberson was driving. She reports [name] is doing much better off metformin and on lower dose Geodon. Sleeping well at night, not groggy during the day. Denies any medication concerns. No longer taking gabapentin and statin. Her sister is with [name] this morning after fall. She has a PCP appointment tomorrow for hospital follow up and to discus HH or other assistance. [name], Diana Roberson, is with [name] today. She denies any hypoglycemia, Reports BG has been in around 140 when checked.   Consent to Services:  The patient was given information about Chronic Care Management services, agreed to services, and gave verbal consent prior to initiation of services.  Please see initial visit note for detailed documentation.   Patient Care Team: Karie Schwalbe, MD as PCP - General (Internal  Medicine) Phil Dopp, [facility] as Pharmacist (Pharmacist)  Recent office visits:  02/21/21 [name], PCP - Stop metformin due to side effects 01/19/21 - Dr. Alphonsus Sias, PCP Telemedicine - Decreased Ziprasidone to 20 mg BID, patient discontinued atorvastatin and gabapentin    Recent consult visits: None since last CCM contact  Hospital visits: ED visit 03/07/21 - Fall, wrist fracture ED visit 02/26/21 - Fall, bilateral shoulder dislocations ED visit 12/26/20- Confusion ED visit 12/25/20- Anxiety ED visit 08/23/20 - Fall  Objective:  Lab Results  Component Value Date   CREATININE 0.89 02/27/2021   BUN 24 (H) 02/27/2021   GFR 67.03 07/19/2020   GFRNONAA >60 02/27/2021   GFRAA 77 01/17/2016   NA 137 02/27/2021   K 4.3 02/27/2021   CALCIUM 9.1 02/27/2021   CO2 27 02/27/2021    Lab Results  Component Value Date/Time   HGBA1C 5.7 (A) 02/21/2021 02:53 PM   HGBA1C 6.2 (H) 11/13/2020 11:59 PM   HGBA1C 7.7 (A) 06/16/2020 11:13 AM   HGBA1C 7.2 (H) 01/14/2019 02:26 PM   GFR 67.03 07/19/2020 04:38 PM   GFR 62.64 03/02/2020 11:09 AM   MICROALBUR 1.7 04/30/2015 12:43 PM   MICROALBUR 2.1 (H) 06/09/2013 12:54 PM    Last diabetic Eye exam:  Lab Results  Component Value Date/Time   HMDIABEYEEXA No Retinopathy 11/04/2020 12:00 AM    Last diabetic Foot exam:  Lab Results  Component Value Date/Time   HMDIABFOOTEX done 01/15/2020 12:00 AM     Lab Results  Component Value Date   CHOL 112 07/19/2020   HDL 53.00 07/19/2020  LDLCALC 42 07/19/2020   LDLDIRECT 127.5 04/05/2010   TRIG 85.0 07/19/2020   CHOLHDL 2 07/19/2020    Hepatic Function Latest Ref Rng & Units 02/26/2021 12/25/2020 11/14/2020  Total Protein 6.5 - 8.1 g/dL 7.6 7.1 6.3(L)  Albumin 3.5 - 5.0 g/dL 4.2 3.7 3.4(L)  AST 15 - 41 U/L 33 28 22  ALT 0 - 44 U/L $Remo'24 22 18  'eFSuI$ Alk Phosphatase 38 - 126 U/L 89 100 99  Total Bilirubin 0.3 - 1.2 mg/dL 1.0 0.8 0.6  Bilirubin, Direct 0.0 - 0.3 mg/dL - - -    Lab Results  Component  Value Date/Time   TSH 1.482 02/27/2021 08:00 AM   TSH 1.339 12/25/2020 10:31 PM   TSH 1.18 02/09/2014 12:54 PM   TSH 0.79 06/09/2013 12:54 PM   FREET4 1.20 (H) 11/13/2020 11:59 PM   FREET4 0.93 07/19/2020 04:38 PM    CBC Latest Ref Rng & Units 02/27/2021 02/26/2021 12/25/2020  WBC 4.0 - 10.5 K/uL 6.4 14.0(H) 6.4  Hemoglobin 12.0 - 15.0 g/dL 10.6(L) 12.0 10.2(L)  Hematocrit 36.0 - 46.0 % 30.5(L) 35.3(L) 30.2(L)  Platelets 150 - 400 K/uL 231 311 283    No results found for: VD25OH  Clinical ASCVD: No  The ASCVD Risk score Mikey Bussing DC Jr., et al., 2013) failed to calculate for the following reasons:   The valid total cholesterol range is 130 to 320 mg/dL    Depression screen [facility] 2/9 07/19/2020 07/17/2019 11/01/2017  Decreased Interest 0 0 0  Down, Depressed, Hopeless 0 0 0  PHQ - 2 Score 0 0 0     DEXA - home foot test done 11/25/19 T score -3.4. PCP did not trust score as reported home foot test unreliable and prior DEXA 2017 normal. Plan to discuss whether to repeat formal test at next AWV.  Social History   Tobacco Use  Smoking Status Never  Smokeless Tobacco Never   BP Readings from Last 3 Encounters:  03/07/21 131/62  02/27/21 (!) 173/91  02/21/21 124/80   Pulse Readings from Last 3 Encounters:  03/07/21 80  02/27/21 97  02/21/21 71   Wt Readings from Last 3 Encounters:  03/07/21 130 lb 4.7 oz (59.1 kg)  02/26/21 130 lb 4.7 oz (59.1 kg)  02/21/21 125 lb (56.7 kg)    Assessment/Interventions: Review of patient past medical history, allergies, medications, health status, including review of consultants reports, laboratory and other test data, was performed as part of comprehensive evaluation and provision of chronic care management services.   SDOH:  (Social Determinants of Health) assessments and interventions performed: Yes - denies cost concerns  SDOH Interventions    Flowsheet Row Most Recent Value  SDOH Interventions   Financial Strain Interventions Intervention  Not Indicated        CCM Care Plan  Allergies  Allergen Reactions   Codeine Sulfate Other (See Comments)    Indigestion and epigastric pain   Sertraline Hcl Other (See Comments)    REACTION: Worse, ? irritable    Medications Reviewed Today     Reviewed by Arby Barrette, CPhT (Pharmacy Technician) on 02/27/21 at [place] List Status: Complete   Medication Order Taking? Sig Documenting Provider Last Dose Status Informant  aspirin EC 81 MG tablet [id number] Yes Take 81 mg by mouth daily. [provider] 02/26/2021 0800 Active Self  brimonidine (ALPHAGAN) 0.2 % ophthalmic solution [id number] Yes Place 1 drop into both eyes 2 (two) times daily. [provider] 02/26/2021 0800 Active Self  cholecalciferol (VITAMIN D) 1000 UNITS tablet [id number] Yes Take 1,000 Units by mouth daily. [provider] 02/26/2021 0800 Active Self  dorzolamide-timolol (COSOPT) 22.3-6.8 MG/ML ophthalmic solution [id number] Yes Place 1 drop into both eyes 2 (two) times daily. [provider] 02/26/2021 8016 Active Self  folic acid (FOLVITE) 1 MG tablet [id number] Yes Take 1 tablet (1 mg total) by mouth daily. Venia Carbon, MD 02/26/2021 Active Self  glucose blood (ONETOUCH VERIO) test strip [id number]  Use to check blood sugar once a day. Dx Code E11.9 Venia Carbon, MD  Active Self  hydroxychloroquine (PLAQUENIL) 200 MG tablet [id number] Yes Take 200 mg by mouth daily. [provider] 02/26/2021 0800 Active Self  lisinopril (ZESTRIL) 10 MG tablet [id number] Yes Take 1 tablet (10 mg total) by mouth daily. Venia Carbon, MD 02/26/2021 Active Self  methotrexate (RHEUMATREX) 2.5 MG tablet [id number] No Take 7 tablets (17.5 mg total) by mouth once a week. Caution:Chemotherapy. Protect from light. Venia Carbon, MD 02/25/2021 Active Self           Med Note Deedra Ehrich Feb 27, 2021 12:41 AM) Friday   OneTouch Delica Lancets 94M MISC [id number]  1 each by  Does not apply route daily. Dx Code E11.9 Venia Carbon, MD  Active Self  QUEtiapine (SEROQUEL) 25 MG tablet [id number] No Take 0.5-1 tablets (12.5-25 mg total) by mouth at bedtime. Venia Carbon, MD 02/24/2021 2000 Active Self  vitamin B-12 (CYANOCOBALAMIN) 1000 MCG tablet [id number] Yes Take 1,000 mcg by mouth daily. [provider] 02/26/2021 0800 Active Self  vitamin E 400 UNIT capsule [id number] Yes Take 400 Units by mouth daily. [provider] 02/26/2021 0800 Active Self  ziprasidone (GEODON) 20 MG capsule [id number] Yes Take 1 capsule (20 mg total) by mouth in the morning and at bedtime. Venia Carbon, MD 02/26/2021 0800 Active Self            Patient Active Problem List   Diagnosis Date Noted   Syncope 02/26/2021   Anemia 11/13/2020   Gait disorder 11/08/2020   OAB (overactive bladder) 07/19/2020   Essential hypertension, benign 01/15/2020   Gastroesophageal reflux disease 11/01/2017   Type 2 diabetes mellitus with circulatory disorder ([place]) 12/28/2015   Seropositive rheumatoid arthritis ([place]) 12/28/2015   Glaucoma 04/30/2015   Advance directive discussed with patient 04/30/2015   Routine general medical examination at a health care facility 12/06/2012   CAROTID BRUIT 11/08/2010   Bipolar 1 disorder, manic, full remission ([place]) 10/01/2009   OSTEOPENIA 07/26/2007   Hyperlipemia 01/22/2007   DIVERTICULOSIS, COLON 01/08/2007   Chronic back pain 01/08/2007    Immunization History  Administered Date(s) Administered   Influenza Whole 07/26/2007   PPD Test 01/28/2021   Pneumococcal Conjugate-13 02/09/2014   Pneumococcal Polysaccharide-23 04/05/2010, 11/01/2017   Td 10/21/1996, 04/05/2010   Zoster, Live 05/10/2013    Conditions to be addressed/monitored:  Hypertension and Diabetes  Care Plan : [place]  Updates made by Debbora Dus, [place] since 03/07/2021 12:00 AM     Problem: CHL AMB "PATIENT-SPECIFIC PROBLEM"      Long-Range  Goal: Disease Management   Start Date: 11/05/2020  This Visit's Progress: On track  Priority: High  Note:   Current Barriers:  Frequent falls  Pharmacist Clinical Goal(s):  Over the next 30 days, patient will achieve control of diabetes as evidenced by fasting BG < 130 through collaboration with PharmD and provider.   Interventions: 1:1 collaboration with Silvio Pate,  Theophilus Kinds, MD regarding development and update of comprehensive plan of care as evidenced by provider attestation and co-signature Inter-disciplinary care team collaboration (see longitudinal plan of care) Comprehensive medication review performed; medication list updated in electronic medical record  Diabetes (A1c goal <7%) -Controlled - A1c 5.7% 02/2021 -Current medications: None -Medications previously tried: metformin, glipizide -Current home glucose readings - not sure frequency of home checks fasting glucose: around 140s post prandial glucose: none -No longer on statin - daughter took her off it. She did not want her on a statin.  -Denies hypoglycemic/hyperglycemic symptoms - denies any low BG < 70 -Recommend continue off medications  Hypertension (BP goal <140/90) -Controlled- per clinic readings  -Current treatment: Lisinopril 10 mg - 1 tablet daily -Medications previously tried: none -Current home readings: none reported today -She does have frequent falls but this does not seem to related to her BP at this time.  -Counseled to monitor BP at home with symptoms of dizziness/falls, document, and provide log at future appointments -Recommended to continue current medication  Health Maintenance -Patient is still having frequent falls, seem to be mechanical? Daughter reports she is doing better off metformin (diarrhea) and lower dose Geodon (less groggy, sleeping better) -Gaps - Last DEXA 2021 considered inaccurate. Prior 2017 normal.  -Current therapy:  Vitamin D3 1000 IU - 1 capsule daily -Recommended consider  repeat DEXA scan if patient would be interested in treatment. Recommend d/c aspirin due to frequent falls and age > 43. No history of ASCVD.   Patient Goals/Self-Care Activities Over the next 30 days, patient will:  - take medications as prescribed   Follow Up Plan: The care management team will reach out to the patient again over the next 90 days.       Medication Assistance: None required.  Patient affirms current coverage meets needs.  Patient's preferred pharmacy is:  [employer]  [facility]) - [place], [address] [place] [address] Phone: [phone] Fax: [phone]  Patient self-administers her medications without any concerns.  Care Plan and Follow Up Patient Decision:  Patient agrees to Care Plan and Follow-up.  Debbora Dus, PharmD Clinical Pharmacist [facility] at [facility] [phone]

## 2021-03-07 NOTE — ED Provider Notes (Signed)
[facility] Emergency Department Provider Note   ____________________________________________   Event Date/Time   First MD Initiated Contact with Patient 03/07/21 [phone]     (approximate)  I have reviewed the triage vital signs and the nursing notes.   HISTORY  Chief Complaint Fall    HPI Diana Roberson is a 75 y.o. female who presents to the ED from home with a chief complaint of left wrist injury.  Patient was reaching down to pick up an Oreo cookie and fell, injuring her left, nondominant wrist.  Presents with swelling to her left wrist.  Denies striking head or LOC.  Voices no other complaints or injuries     Past Medical History:  Diagnosis Date   Allergy    Bipolar 1 disorder (HCC)    Chronic back pain    Diabetes mellitus    Difficult intubation    Diverticulitis    GERD (gastroesophageal reflux disease)    Glaucoma    Hyperlipidemia    Hypertension    Mental health disorder    admitted x3   Osteopenia    Rheumatoid arthritis (HCC)     Patient Active Problem List   Diagnosis Date Noted   Syncope 02/26/2021   Anemia 11/13/2020   Gait disorder 11/08/2020   OAB (overactive bladder) 07/19/2020   Essential hypertension, benign 01/15/2020   Gastroesophageal reflux disease 11/01/2017   Type 2 diabetes mellitus with circulatory disorder (HCC) 12/28/2015   Seropositive rheumatoid arthritis (HCC) 12/28/2015   Glaucoma 04/30/2015   Advance directive discussed with patient 04/30/2015   Routine general medical examination at a health care facility 12/06/2012   CAROTID BRUIT 11/08/2010   Bipolar 1 disorder, manic, full remission (HCC) 10/01/2009   OSTEOPENIA 07/26/2007   Hyperlipemia 01/22/2007   DIVERTICULOSIS, COLON 01/08/2007   Chronic back pain 01/08/2007    Past Surgical History:  Procedure Laterality Date   APPENDECTOMY     BACK SURGERY  1988   BREAST CYST ASPIRATION     neg   CHOLECYSTECTOMY     COLONOSCOPY      COLONOSCOPY WITH PROPOFOL N/A 11/27/2017   Procedure: COLONOSCOPY WITH PROPOFOL;  Surgeon: Christena Deem, MD;  Location: [facility] ENDOSCOPY;  Service: Endoscopy;  Laterality: N/A;   HAND SURGERY     ORIF ELBOW FRACTURE Right 06/24/2019   Procedure: OPEN REDUCTION INTERNAL FIXATION (ORIF) ELBOW/OLECRANON FRACTURE;  Surgeon: Christena Flake, MD;  Location: [facility] ORS;  Service: Orthopedics;  Laterality: Right;    Prior to Admission medications   Medication Sig Start Date End Date Taking? Authorizing Provider  HYDROcodone-acetaminophen (NORCO) 5-325 MG tablet Take 1 tablet by mouth every 6 (six) hours as needed for moderate pain. 03/07/21  Yes Irean Hong, MD  aspirin EC 81 MG tablet Take 81 mg by mouth daily.    [provider]  brimonidine (ALPHAGAN) 0.2 % ophthalmic solution Place 1 drop into both eyes 2 (two) times daily.    [provider]  cholecalciferol (VITAMIN D) 1000 UNITS tablet Take 1,000 Units by mouth daily.    [provider]  dorzolamide-timolol (COSOPT) 22.3-6.8 MG/ML ophthalmic solution Place 1 drop into both eyes 2 (two) times daily.    [provider]  folic acid (FOLVITE) 1 MG tablet Take 1 tablet (1 mg total) by mouth daily. 01/07/18   Tillman Abide I, MD  glucose blood (ONETOUCH VERIO) test strip Use to check blood sugar once a day. Dx Code E11.9 01/15/19   Karie Schwalbe,  MD  hydroxychloroquine (PLAQUENIL) 200 MG tablet Take 200 mg by mouth daily. 10/11/20   [provider]  lisinopril (ZESTRIL) 10 MG tablet Take 1 tablet (10 mg total) by mouth daily. 11/08/20   Karie Schwalbe, MD  methotrexate (RHEUMATREX) 2.5 MG tablet Take 7 tablets (17.5 mg total) by mouth once a week. Caution:Chemotherapy. Protect from light. 09/21/20   Karie Schwalbe, MD  OneTouch Delica Lancets 33G MISC 1 each by Does not apply route daily. Dx Code E11.9 01/15/19   Karie Schwalbe, MD  QUEtiapine (SEROQUEL) 25 MG tablet Take 0.5-1 tablets (12.5-25 mg  total) by mouth at bedtime. 02/23/21   Karie Schwalbe, MD  vitamin B-12 (CYANOCOBALAMIN) 1000 MCG tablet Take 1,000 mcg by mouth daily.    [provider]  vitamin E 400 UNIT capsule Take 400 Units by mouth daily.    [provider]  ziprasidone (GEODON) 20 MG capsule Take 1 capsule (20 mg total) by mouth in the morning and at bedtime. 01/19/21 02/27/21  Karie Schwalbe, MD    Allergies Codeine sulfate and Sertraline hcl  Family History  Problem Relation Age of Onset   Diabetes Mother    Hypertension Mother    Stroke Sister    Hashimoto's thyroiditis Daughter    Stroke Father    Stroke Sister    Cancer Neg Hx    Breast cancer Neg Hx     Social History Social History   Tobacco Use   Smoking status: Never   Smokeless tobacco: Never  Vaping Use   Vaping Use: Never used  Substance Use Topics   Alcohol use: No   Drug use: No    Review of Systems  Constitutional: No fever/chills Eyes: No visual changes. ENT: No sore throat. Cardiovascular: Denies chest pain. Respiratory: Denies shortness of breath. Gastrointestinal: No abdominal pain.  No nausea, no vomiting.  No diarrhea.  No constipation. Genitourinary: Negative for dysuria. Musculoskeletal: Positive for left wrist injury.  Negative for back pain. Skin: Negative for rash. Neurological: Negative for headaches, focal weakness or numbness.   ____________________________________________   PHYSICAL EXAM:  VITAL SIGNS: ED Triage Vitals  Enc Vitals Group     BP 03/07/21 0331 (!) 135/57     Pulse Rate 03/07/21 0331 94     Resp 03/07/21 0331 20     Temp 03/07/21 0331 98.5 F (36.9 C)     Temp Source 03/07/21 0331 Oral     SpO2 03/07/21 0331 97 %     Weight 03/07/21 0334 130 lb 4.7 oz (59.1 kg)     Height 03/07/21 0334 5\' 4"  (1.626 m)     Head Circumference --      Peak Flow --      Pain Score 03/07/21 0334 7     Pain Loc --      Pain Edu? --      Excl. in GC? --     Constitutional: Alert  and oriented.  Elderly appearing and in no acute distress. Eyes: Conjunctivae are normal. PERRL. EOMI. Head: Atraumatic. Nose: Atraumatic. Mouth/Throat: Mucous membranes are moist.  No dental malocclusion. Neck: No stridor.  No cervical spine tenderness to palpation. Cardiovascular: Normal rate, regular rhythm. Grossly normal heart sounds.  Good peripheral circulation. Respiratory: Normal respiratory effort.  No retractions. Lungs CTAB. Gastrointestinal: Soft and nontender. No distention. No abdominal bruits. No CVA tenderness. Musculoskeletal:  LUE: Swelling to dorsal left wrist.  Limited range of motion secondary to pain.  Tender to  palpation.  2+ radial pulse.  Brisk, less than 5-second capillary refill. No lower extremity tenderness nor edema.  No joint effusions. Neurologic:  Normal speech and language. No gross focal neurologic deficits are appreciated. No gait instability. Skin:  Skin is warm, dry and intact. No rash noted. Psychiatric: Mood and affect are normal. Speech and behavior are normal.  ____________________________________________   LABS (all labs ordered are listed, but only abnormal results are displayed)  Labs Reviewed - No data to display ____________________________________________  EKG  None ____________________________________________  RADIOLOGY I, Kentrel Clevenger J, personally viewed and evaluated these images (plain radiographs) as part of my medical decision making, as well as reviewing the written report by the radiologist.  ED MD interpretation: Impacted distal radius fracture  Official radiology report(s): DG Wrist Complete Left  Result Date: 03/07/2021 CLINICAL DATA:  Fall with wrist injury EXAM: LEFT WRIST - COMPLETE 3+ VIEW COMPARISON:  08/22/2020 FINDINGS: Transverse distal radius fracture with dorsal impaction and dorsal radial tilting. Angulation measures at least 40 degrees. Medially there is extension to the joint surface. Ulnar positive variance.  Generalized osteopenia. IMPRESSION: Impacted distal radius fracture with dorsal tilting. Electronically Signed   By: Marnee Spring M.D.   On: 03/07/2021 04:17    ____________________________________________   PROCEDURES  Procedure(s) performed (including Critical Care):  Procedures   ____________________________________________   INITIAL IMPRESSION / ASSESSMENT AND PLAN / ED COURSE  As part of my medical decision making, I reviewed the following data within the electronic MEDICAL RECORD NUMBER History obtained from family, Nursing notes reviewed and incorporated, Radiograph reviewed, Notes from prior ED visits, and [place] Controlled Substance Database     75 year old female presenting with left wrist injury.  Distal radius fracture noted on x-ray.  Will place in an OCL splint, sling, administer Norco and patient will follow up with orthopedics this week.  Strict return precautions given.  Patient and daughter verbalize understanding and agree with plan of care      ____________________________________________   FINAL CLINICAL IMPRESSION(S) / ED DIAGNOSES  Final diagnoses:  Fall, initial encounter  Left wrist fracture, closed, initial encounter     ED Discharge Orders          Ordered    HYDROcodone-acetaminophen (NORCO) 5-325 MG tablet  Every 6 hours PRN        03/07/21 0504             Note:  This document was prepared using Dragon voice recognition software and may include unintentional dictation errors.    Irean Hong, MD 03/07/21 [phone]

## 2021-03-07 NOTE — Discharge Instructions (Addendum)
1.  You may take Tylenol as needed for pain; Norco as needed for more severe pain. Do not exceed more than 4g of Tylenol per day. 2.  Keep splint clean and dry.  Elevate affected area and apply ice over splint several times daily. 3.  Wear sling as needed for comfort. 4.  Return to the ER for worsening symptoms, persistent vomiting, difficulty breathing or other concerns

## 2021-03-07 NOTE — Telephone Encounter (Signed)
Tried to call pt's daughter, Diana Roberson, as her number comes up as the cell number to call for pt. Pt was in ER today for a fall and broken wrist. Pt is scheduled for a 1 month follow-up here tomorrow. I was calling to make sure they were coming to that appt so Dr Alphonsus Sias can discuss medications for her bones.

## 2021-03-08 ENCOUNTER — Telehealth: Payer: Self-pay

## 2021-03-08 ENCOUNTER — Ambulatory Visit (INDEPENDENT_AMBULATORY_CARE_PROVIDER_SITE_OTHER): Payer: Medicare Other | Admitting: Internal Medicine

## 2021-03-08 ENCOUNTER — Encounter: Payer: Self-pay | Admitting: Internal Medicine

## 2021-03-08 ENCOUNTER — Other Ambulatory Visit: Payer: Self-pay

## 2021-03-08 VITALS — BP 110/70 | HR 78 | Temp 97.8°F | Ht 64.0 in | Wt 126.0 lb

## 2021-03-08 DIAGNOSIS — M810 Age-related osteoporosis without current pathological fracture: Secondary | ICD-10-CM | POA: Diagnosis not present

## 2021-03-08 DIAGNOSIS — S62102A Fracture of unspecified carpal bone, left wrist, initial encounter for closed fracture: Secondary | ICD-10-CM | POA: Diagnosis not present

## 2021-03-08 NOTE — Chronic Care Management (AMB) (Signed)
  Chronic Care Management   Note  03/08/2021 Name: HAYDON KALMAR MRN: [id number] DOB: Oct 24, 1945  JACQUILINE ZURCHER is a 75 y.o. year old female who is a primary care patient of Karie Schwalbe, MD. CARRON MCMURRY is currently enrolled in care management services. An additional referral for Licensed Clinical SW  was placed.   Follow up plan: Telephone appointment with care management team member scheduled for:03/17/2021  Penne Lash, RMA Care Guide, Embedded Care Coordination [facility]  [place], [place] [address] Direct Dial: [phone] [email or web address]

## 2021-03-08 NOTE — Assessment & Plan Note (Signed)
I think Rx with bisphosphonate is appropriate Will wait till she has stabilized Consider DEXA---or empiric Rx (will hold off now due to multiple issues and need for rehab)

## 2021-03-08 NOTE — Progress Notes (Signed)
Subjective:    Patient ID: Diana Roberson, female    DOB: [birth year], 75 y.o.   MRN: [id number]  HPI Here with daughter for ER follow up--another fall and wrist fracture This visit occurred during the SARS-CoV-2 public health emergency.  Safety protocols were in place, including screening questions prior to the visit, additional usage of staff PPE, and extensive cleaning of exam room while observing appropriate contact time as indicated for disinfecting solutions.   Fell 2 nights ago Dropped a cookie on the floor---bent down to pick it up and just kept going Broke fall with left wrist fracture Larey Seat the week before--dislocated both shoulders---doesn't remember that fall (apparently was just walking in daughter's house)  Daughter in [place] not able to help too much Daughter here with her is out of area Now needs 24 hour care---hard to use hands, can't pull up pants, etc Is on waiting list for [facility] (from even before the fall)  Now needs help with all ADLs Even trouble eating Hydrocodone does help her pain  Current Outpatient Medications on File Prior to Visit  Medication Sig Dispense Refill   aspirin EC 81 MG tablet Take 81 mg by mouth daily.     brimonidine (ALPHAGAN) 0.2 % ophthalmic solution Place 1 drop into both eyes 2 (two) times daily.     cholecalciferol (VITAMIN D) 1000 UNITS tablet Take 1,000 Units by mouth daily.     dorzolamide-timolol (COSOPT) 22.3-6.8 MG/ML ophthalmic solution Place 1 drop into both eyes 2 (two) times daily.     folic acid (FOLVITE) 1 MG tablet Take 1 tablet (1 mg total) by mouth daily. 90 tablet 3   glucose blood (ONETOUCH VERIO) test strip Use to check blood sugar once a day. Dx Code E11.9 100 each 3   HYDROcodone-acetaminophen (NORCO) 5-325 MG tablet Take 1 tablet by mouth every 6 (six) hours as needed for moderate pain. 15 tablet 0   hydroxychloroquine (PLAQUENIL) 200 MG tablet Take 200 mg by mouth daily.     lisinopril (ZESTRIL) 10 MG tablet  Take 1 tablet (10 mg total) by mouth daily. 90 tablet 3   methotrexate (RHEUMATREX) 2.5 MG tablet Take 7 tablets (17.5 mg total) by mouth once a week. Caution:Chemotherapy. Protect from light. 91 tablet 3   OneTouch Delica Lancets 33G MISC 1 each by Does not apply route daily. Dx Code E11.9 100 each 3   QUEtiapine (SEROQUEL) 25 MG tablet Take 0.5-1 tablets (12.5-25 mg total) by mouth at bedtime. 90 tablet 3   vitamin B-12 (CYANOCOBALAMIN) 1000 MCG tablet Take 1,000 mcg by mouth daily.     vitamin E 400 UNIT capsule Take 400 Units by mouth daily.     ziprasidone (GEODON) 20 MG capsule Take 1 capsule (20 mg total) by mouth in the morning and at bedtime. 60 capsule 2   No current facility-administered medications on file prior to visit.    Allergies  Allergen Reactions   Codeine Sulfate Other (See Comments)    Indigestion and epigastric pain   Sertraline Hcl Other (See Comments)    REACTION: Worse, ? irritable    Past Medical History:  Diagnosis Date   Allergy    Bipolar 1 disorder (HCC)    Chronic back pain    Diabetes mellitus    Difficult intubation    Diverticulitis    GERD (gastroesophageal reflux disease)    Glaucoma    Hyperlipidemia    Hypertension    Mental health disorder    admitted x3  Osteopenia    Rheumatoid arthritis (HCC)     Past Surgical History:  Procedure Laterality Date   APPENDECTOMY     BACK SURGERY  1988   BREAST CYST ASPIRATION     neg   CHOLECYSTECTOMY     COLONOSCOPY     COLONOSCOPY WITH PROPOFOL N/A 11/27/2017   Procedure: COLONOSCOPY WITH PROPOFOL;  Surgeon: Christena Deem, MD;  Location: [facility] ENDOSCOPY;  Service: Endoscopy;  Laterality: N/A;   HAND SURGERY     ORIF ELBOW FRACTURE Right 06/24/2019   Procedure: OPEN REDUCTION INTERNAL FIXATION (ORIF) ELBOW/OLECRANON FRACTURE;  Surgeon: Christena Flake, MD;  Location: [facility] ORS;  Service: Orthopedics;  Laterality: Right;    Family History  Problem Relation Age of Onset   Diabetes Mother     Hypertension Mother    Stroke Sister    Hashimoto's thyroiditis Daughter    Stroke Father    Stroke Sister    Cancer Neg Hx    Breast cancer Neg Hx     Social History   Socioeconomic History   Marital status: Widowed    Spouse name: Not on file   Number of children: 2   Years of education: Not on file   Highest education level: Not on file  Occupational History   Occupation: formerly Designer, jewellery for [employer]  Tobacco Use   Smoking status: Never   Smokeless tobacco: Never  Vaping Use   Vaping Use: Never used  Substance and Sexual Activity   Alcohol use: No   Drug use: No   Sexual activity: Not on file  Other Topics Concern   Not on file  Social History Narrative   Widowed 3/22   Has living will   Daughter Cala Bradford should be health care POA   Would accept resuscitation attempts   Would not want feeding tube if cognitively unaware   Social Determinants of Health   Financial Resource Strain: Low Risk    Difficulty of Paying Living Expenses: Not very hard  Food Insecurity: Not on file  Transportation Needs: Not on file  Physical Activity: Not on file  Stress: Not on file  Social Connections: Not on file  Intimate Partner Violence: Not on file   Review of Systems Sleeping okay Appetite is okay     Objective:   Physical Exam         Assessment & Plan:

## 2021-03-08 NOTE — Assessment & Plan Note (Signed)
Repeated falls Going to ortho in 2 days--may need surgery Unable to care for herself---really needs rehab at this point (or at least AL with PT/OT as well) Will make social work referral Discussed with daughter

## 2021-03-10 DIAGNOSIS — S52532A Colles' fracture of left radius, initial encounter for closed fracture: Secondary | ICD-10-CM | POA: Diagnosis not present

## 2021-03-11 ENCOUNTER — Telehealth: Payer: Self-pay | Admitting: Internal Medicine

## 2021-03-11 DIAGNOSIS — Z1159 Encounter for screening for other viral diseases: Secondary | ICD-10-CM | POA: Diagnosis not present

## 2021-03-11 NOTE — Telephone Encounter (Signed)
[facility] call for patient requesting a new referral

## 2021-03-11 NOTE — Telephone Encounter (Signed)
Spoke with Marcelino Duster, patient's daughter, asked if she is aware what this is about or if insurance has called them. Marcelino Duster was not aware of anything and will check with her sister to make sure she did not get any calls from them.  There is no name or phone number given. Need more information next time please. Hopefully insurance will call back. Not able to proceed any further with this at this time.

## 2021-03-15 ENCOUNTER — Telehealth: Payer: Self-pay | Admitting: Internal Medicine

## 2021-03-15 NOTE — Telephone Encounter (Signed)
Patient is scheduled with [facility] SW on 03/17/21 Surgery is scheduled for tomorrow 03/16/21 through [facility] Per Dr Alphonsus Sias, patient needs SW call prior to surgery or at least communication with patient daughter [name] regarding care post surgery.  I spoke with Penne Lash, [facility] Care Guide and she is reaching out to the social worker regarding this patient to try and get scheduled sooner or coordinate communication with Daughter.    Shannon/Dr Alphonsus Sias You should be able to see updated notes within the chart through [facility]

## 2021-03-15 NOTE — Telephone Encounter (Signed)
Diana Roberson (daughter) called in and stated they are needing a referral for [facility] as they cannot be with [facility] 24/7. She states Tyrhonda is not able to do anything for herself due to hurting her shoulders and her wrist. [name] can be reached at [phone].

## 2021-03-15 NOTE — Telephone Encounter (Signed)
Spoke to pt's daughter. She will be having surgery. Was set to go to [facility] but found they had some bad reviews. Was looking at putting her in [facility] in [place] but the Ortho specialist said he will not do rehab admit for upper extremity. Her surgery is outpt and she lives alone except for the am and pm people coming to check in an her to help. The orthopedic surgeon doing the surgery said they would not send her to a rehab for an upper extremity surgery. Daughter is very concerned and does not know what to do. Both she and her sister are not able to take care of her 24/7 while she heals.

## 2021-03-15 NOTE — Telephone Encounter (Signed)
Patient daughter [name] call in requesting a call back about patients Referral

## 2021-03-16 DIAGNOSIS — S52572A Other intraarticular fracture of lower end of left radius, initial encounter for closed fracture: Secondary | ICD-10-CM | POA: Diagnosis not present

## 2021-03-16 DIAGNOSIS — S52532A Colles' fracture of left radius, initial encounter for closed fracture: Secondary | ICD-10-CM | POA: Diagnosis not present

## 2021-03-16 NOTE — Telephone Encounter (Signed)
See other phone note

## 2021-03-16 NOTE — Telephone Encounter (Signed)
No, [facility] is the location of the rehab facility they would like to use.

## 2021-03-16 NOTE — Telephone Encounter (Addendum)
Spoke to [employer]. She said the SW called her and said the did not think they could help get her in a rehab. She is going to check with the pt's insurance one more time. She also mentioned that they are having a hard time finding a psychiatrist for her to see to take over the Geodon. And, assisted living places do not want to take her while she is on that medication and not seeing a psychiatrist. So, she is going to stop the medication. She said she has only been taking 1 the last few days and has been fine.

## 2021-03-17 ENCOUNTER — Ambulatory Visit (INDEPENDENT_AMBULATORY_CARE_PROVIDER_SITE_OTHER): Payer: Medicare Other | Admitting: *Deleted

## 2021-03-17 DIAGNOSIS — M810 Age-related osteoporosis without current pathological fracture: Secondary | ICD-10-CM

## 2021-03-17 DIAGNOSIS — S62102B Fracture of unspecified carpal bone, left wrist, initial encounter for open fracture: Secondary | ICD-10-CM

## 2021-03-17 DIAGNOSIS — E1159 Type 2 diabetes mellitus with other circulatory complications: Secondary | ICD-10-CM

## 2021-03-17 NOTE — Telephone Encounter (Signed)
I reviewed the social worker's note and they are trying to help get her somewhere to go. I am concerned about stopping the geodon--but we should just restart if she starts having any symptoms

## 2021-03-17 NOTE — Chronic Care Management (AMB) (Signed)
Chronic Care Management    Clinical Social Work Note  03/17/2021 Name: Diana Roberson MRN: [id number] DOB: [birth year]  Diana Roberson is a 75 y.o. year old female who is a primary care patient of [place]vak, Berneda Rose, MD. The CCM team was consulted to assist the patient with chronic disease management and/or care coordination needs related to: [employer] , Level of Care Concerns, and Caregiver Stress.   Engaged with patient by telephone for initial visit in response to provider referral for social work chronic care management and care coordination services.   Consent to Services:  The patient was given information about Chronic Care Management services, agreed to services, and gave verbal consent prior to initiation of services.  Please see initial visit note for detailed documentation.   Patient agreed to services and consent obtained.   Assessment: Review of patient past medical history, allergies, medications, and health status, including review of relevant consultants reports was performed today as part of a comprehensive evaluation and provision of chronic care management and care coordination services.     SDOH (Social Determinants of Health) assessments and interventions performed:    Advanced Directives Status: See Care Plan for related entries.  CCM Care Plan  Allergies  Allergen Reactions   Codeine Sulfate Other (See Comments)    Indigestion and epigastric pain   Sertraline Hcl Other (See Comments)    REACTION: Worse, ? irritable    Outpatient Encounter Medications as of 03/17/2021  Medication Sig Note   aspirin EC 81 MG tablet Take 81 mg by mouth daily.    brimonidine (ALPHAGAN) 0.2 % ophthalmic solution Place 1 drop into both eyes 2 (two) times daily.    cholecalciferol (VITAMIN D) 1000 UNITS tablet Take 1,000 Units by mouth daily.    dorzolamide-timolol (COSOPT) 22.3-6.8 MG/ML ophthalmic solution Place 1 drop into both eyes 2 (two) times daily.    folic acid  (FOLVITE) 1 MG tablet Take 1 tablet (1 mg total) by mouth daily.    glucose blood (ONETOUCH VERIO) test strip Use to check blood sugar once a day. Dx Code E11.9    HYDROcodone-acetaminophen (NORCO) 5-325 MG tablet Take 1 tablet by mouth every 6 (six) hours as needed for moderate pain.    hydroxychloroquine (PLAQUENIL) 200 MG tablet Take 200 mg by mouth daily.    lisinopril (ZESTRIL) 10 MG tablet Take 1 tablet (10 mg total) by mouth daily.    methotrexate (RHEUMATREX) 2.5 MG tablet Take 7 tablets (17.5 mg total) by mouth once a week. Caution:Chemotherapy. Protect from light. 02/27/2021: Friday    OneTouch Delica Lancets 33G MISC 1 each by Does not apply route daily. Dx Code E11.9    QUEtiapine (SEROQUEL) 25 MG tablet Take 0.5-1 tablets (12.5-25 mg total) by mouth at bedtime.    vitamin B-12 (CYANOCOBALAMIN) 1000 MCG tablet Take 1,000 mcg by mouth daily.    vitamin E 400 UNIT capsule Take 400 Units by mouth daily.    ziprasidone (GEODON) 20 MG capsule Take 1 capsule (20 mg total) by mouth in the morning and at bedtime.    No facility-administered encounter medications on file as of 03/17/2021.    Patient Active Problem List   Diagnosis Date Noted   Left wrist fracture 03/08/2021   Osteoporosis 03/08/2021   Syncope 02/26/2021   Anemia 11/13/2020   Gait disorder 11/08/2020   OAB (overactive bladder) 07/19/2020   Essential hypertension, benign 01/15/2020   Gastroesophageal reflux disease 11/01/2017   Type 2 diabetes mellitus with circulatory  disorder (HCC) 12/28/2015   Seropositive rheumatoid arthritis (HCC) 12/28/2015   Glaucoma 04/30/2015   Advance directive discussed with patient 04/30/2015   Routine general medical examination at a health care facility 12/06/2012   CAROTID BRUIT 11/08/2010   Bipolar 1 disorder, manic, full remission (HCC) 10/01/2009   OSTEOPENIA 07/26/2007   Hyperlipemia 01/22/2007   DIVERTICULOSIS, COLON 01/08/2007   Chronic back pain 01/08/2007    Conditions to be  addressed/monitored:  recent falls with injuries ; Level of care concerns, ADL IADL limitations, Inability to perform ADL's independently, Inability to perform IADL's independently, Lacks knowledge of community resources, and home health needs  Care Plan : LCSW Plan of Care  Updates made by Buck Mam, LCSW since 03/17/2021 12:00 AM     Problem: Functional Decline and need for support/services   Priority: High     Long-Range Goal: To provide and develop support services to enhance pt's safety and quality ot life   Start Date: 03/17/2021  Expected End Date: 05/10/2021  This Visit's Progress: On track  Priority: High  Note:   Current barriers:    Level of care concerns, ADL IADL limitations, Inability to perform ADL's independently, Inability to perform IADL's independently, and Lacks knowledge of community resources Clinical Goals: Patient will work with team to address needs related to recent falls with impairment, long term care planning Clinical Interventions:  Collaboration with Karie Schwalbe, MD regarding development and update of comprehensive plan of care as evidenced by provider attestation and co-signature Inter-disciplinary care team collaboration (see longitudinal plan of care) Assessment of needs, barriers , agencies contacted, as well as how impacting  Review various resources, discussed options and provided patient information about Private pay options home health needs and Discussed several options for long term counseling based on need and insurance Patient interviewed and appropriate assessments performed Discussed plans with patient for ongoing care management follow up and provided patient with direct contact information for care management team Advised patient to discuss and determine plan of care options; home with [facility] and caregiver support, ALF or SNF Collaborated with primary care provider re: need for Home Health orders to be processed to agency to provide  HHPT,OT,RN,SW and bath aide Provided education to patient/caregiver regarding level of care options. Clinical interventions provided:Solution-Focused Strategies, Active listening / Reflection utilized , Problem Solving [facility] , and Consideration of in-home help encouraged  Patient Goals/Self-Care Activities: Over the next 20 days Call your insurance provider for more information about your Enhanced Benefits  Call [facility] to follow up on admission options Continue with therapy follow up on ALF vs SNF options expect to hear from home health provider begin a notebook of services in my neighborhood or community call 211 when I need some help follow-up on any referrals for help I am given think ahead to make sure my need does not become an emergency -make a note about what I need to have by the phone or take with me, like an identification card or social security number have a back-up plan have a back-up plan make a list of family or friends that I can call         Follow Up Plan:  Appointment scheduled for SW follow up with client by phone on: 03/21/21      Reece Levy MSW, LCSW Licensed Clinical Social Worker [facility] [phone]

## 2021-03-17 NOTE — Telephone Encounter (Addendum)
Late yesterday I received a call from Sandrea Matte, Admissions Coord at [facility]. He had received multiple calls from the pt's daughter about getting her admitted there. He sent an FL2 form and asked for notes as well. Dr Alphonsus Sias does not get in the office until about 1pm on Thursdays. Per Dr Karle Starch entry, the social worker is trying to help. I have sent her a staff message letting her know that Dr Alphonsus Sias will do an FL2 if needed. We did not want to send information somewhere if social work found another place.

## 2021-03-17 NOTE — Patient Instructions (Signed)
Visit Information   PATIENT GOALS:   Goals Addressed             This Visit's Progress    Find Help in My Community       Timeframe:  Long-Range Goal Priority:  High Start Date:    03/16/21                         Expected End Date:      05/10/21                 Follow Up Date 03/21/21    -follow up on ALF vs SNF options -expect to hear from home health provider - begin a notebook of services in my neighborhood or community - call 211 when I need some help - follow-up on any referrals for help I am given - think ahead to make sure my need does not become an emergency - make a note about what I need to have by the phone or take with me, like an identification card or social security number have a back-up plan - have a back-up plan - make a list of family or friends that I can call    Why is this important?   Knowing how and where to find help for yourself or family in your neighborhood and community is an important skill.  You will want to take some steps to learn how.    Notes:          Consent to CCM Services: Ms. Orren was given information about Chronic Care Management services today including:  CCM service includes personalized support from designated clinical staff supervised by her physician, including individualized plan of care and coordination with other care providers 24/7 contact phone numbers for assistance for urgent and routine care needs. Service will only be billed when office clinical staff spend 20 minutes or more in a month to coordinate care. Only one practitioner may furnish and bill the service in a calendar month. The patient may stop CCM services at any time (effective at the end of the month) by phone call to the office staff. The patient will be responsible for cost sharing (co-pay) of up to 20% of the service fee (after annual deductible is met).  Patient agreed to services and verbal consent obtained.   The patient verbalized understanding  of instructions, educational materials, and care plan provided today and declined offer to receive copy of patient instructions, educational materials, and care plan.   Telephone follow up appointment with care management team member scheduled for:03/21/21  Eduard Clos MSW, LCSW Licensed Clinical Social Worker [place] [phone]   CLINICAL CARE PLAN: Patient Care Plan: CCM Pharmacy Care Plan     Problem Identified: CHL AMB "PATIENT-SPECIFIC PROBLEM"      Long-Range Goal: Disease Management   Start Date: 11/05/2020  This Visit's Progress: On track  Priority: High  Note:   Current Barriers:  Frequent falls  Pharmacist Clinical Goal(s):  Over the next 30 days, patient will achieve control of diabetes as evidenced by fasting BG < 130 through collaboration with PharmD and provider.   Interventions: 1:1 collaboration with Venia Carbon, MD regarding development and update of comprehensive plan of care as evidenced by provider attestation and co-signature Inter-disciplinary care team collaboration (see longitudinal plan of care) Comprehensive medication review performed; medication list updated in electronic medical record  Diabetes (A1c goal <7%) -Controlled - A1c 5.7% 02/2021 -Current medications:  None -Medications previously tried: metformin, glipizide -Current home glucose readings - not sure frequency of home checks fasting glucose: around 140s post prandial glucose: none -No longer on statin - daughter took her off it. She did not want her on a statin.  -Denies hypoglycemic/hyperglycemic symptoms - denies any low BG < 70 -Recommend continue off medications  Hypertension (BP goal <140/90) -Controlled- per clinic readings  -Current treatment: Lisinopril 10 mg - 1 tablet daily -Medications previously tried: none -Current home readings: none reported today -She does have frequent falls but this does not seem to related to her BP at this time.  -Counseled  to monitor BP at home with symptoms of dizziness/falls, document, and provide log at future appointments -Recommended to continue current medication  Health Maintenance -Patient is still having frequent falls, seem to be mechanical? Daughter reports she is doing better off metformin (diarrhea) and lower dose Geodon (less groggy, sleeping better) -Gaps - Last DEXA 2021 considered inaccurate. Prior 2017 normal.  -Current therapy:  Vitamin D3 1000 IU - 1 capsule daily -Recommended consider repeat DEXA scan if patient would be interested in treatment. Recommend d/c aspirin due to frequent falls and age > 20. No history of ASCVD.   Patient Goals/Self-Care Activities Over the next 30 days, patient will:  - take medications as prescribed   Follow Up Plan: The care management team will reach out to the patient again over the next 90 days.     Patient Care Plan: LCSW Plan of Care     Problem Identified: Functional Decline and need for support/services   Priority: High     Long-Range Goal: To provide and develop support services to enhance pt's safety and quality ot life   Start Date: 03/17/2021  Expected End Date: 05/10/2021  This Visit's Progress: On track  Priority: High  Note:   Current barriers:    Level of care concerns, ADL IADL limitations, Inability to perform ADL's independently, Inability to perform IADL's independently, and Lacks knowledge of community resources Clinical Goals: Patient will work with team to address needs related to recent falls with impairment, long term care planning Clinical Interventions:  CSW received referral to reach out to pt and family due to "Needs rehab and probably assisted living long term after that. Family lacks resources for needed 24 hour care".  CSW spoke with pt's daughters who report she has just had orthopedic surgery on 03/16/21 and is home needing assistance. CSW had lengthy conversation with them about options; in home care (home health services  and custodial care/caregiver), ALF and/or SNF. Each option has barriers; cost, insurance approval, etc and family is uncertain which route to go.  CSW also advised family that a home health PT evaluation is likely needed for ALF or SNF to consider her; as well for insurance approval.  Per daughter, home health has not been arranged and CSW has encouraged daughter to ask surgeon to place orders for this; and/or PCP may be able to do so with a  "Face to Face" visit.   Daughters are considering the SNF route and believe insurance will cover it- they are pursuing this with [facility].  I have provided the PASARR # to the SNF rep and they have faxed an FL2 to PCP for completion.  Collaboration with Venia Carbon, MD regarding development and update of comprehensive plan of care as evidenced by provider attestation and co-signature Inter-disciplinary care team collaboration (see longitudinal plan of care) Assessment of needs, barriers , agencies  contacted, as well as how impacting  Review various resources, discussed options and provided patient information about Private pay options home health needs and Discussed several options for long term counseling based on need and insurance Patient interviewed and appropriate assessments performed Discussed plans with patient for ongoing care management follow up and provided patient with direct contact information for care management team Advised patient to discuss and determine plan of care options; home with [facility] and caregiver support, ALF or SNF Collaborated with primary care provider re: need for Home Health orders to be processed to agency to provide HHPT,OT,RN,SW and bath aide Provided education to patient/caregiver regarding level of care options. Clinical interventions provided:Solution-Focused Strategies, Active listening / Reflection utilized , Problem [place] , and Consideration of in-home help encouraged  Patient Goals/Self-Care  Activities: Over the next 20 days Call your insurance provider for more information about your Enhanced Benefits  Call [facility] to follow up on admission options Continue with therapy follow up on ALF vs SNF options expect to hear from home health provider begin a notebook of services in my neighborhood or community call 211 when I need some help follow-up on any referrals for help I am given think ahead to make sure my need does not become an emergency -make a note about what I need to have by the phone or take with me, like an identification card or social security number have a back-up plan have a back-up plan make a list of family or friends that I can call

## 2021-03-18 ENCOUNTER — Ambulatory Visit: Payer: Medicare Other | Admitting: *Deleted

## 2021-03-18 ENCOUNTER — Telehealth: Payer: Self-pay

## 2021-03-18 DIAGNOSIS — E1159 Type 2 diabetes mellitus with other circulatory complications: Secondary | ICD-10-CM

## 2021-03-18 DIAGNOSIS — S62102B Fracture of unspecified carpal bone, left wrist, initial encounter for open fracture: Secondary | ICD-10-CM

## 2021-03-18 DIAGNOSIS — S62102A Fracture of unspecified carpal bone, left wrist, initial encounter for closed fracture: Secondary | ICD-10-CM

## 2021-03-18 NOTE — Telephone Encounter (Signed)
Form, notes, and demographics faxed to the attention of Ricky at [employer]. I let Marylu Lund know, also. Will forward to [place] about the charge.

## 2021-03-18 NOTE — Telephone Encounter (Signed)
I added medications to the [facility] and placed in Dr Karle Starch inbox to complete.

## 2021-03-18 NOTE — Telephone Encounter (Signed)
Called Kim to verify the medication list so I can that together on the [facility] for Dr Alphonsus Sias.

## 2021-03-18 NOTE — Chronic Care Management (AMB) (Signed)
Chronic Care Management    Clinical Social Work Note  03/18/2021 Name: Diana Roberson MRN: [id number] DOB: 1945-11-02  Diana Roberson is a 75 y.o. year old female who is a primary care patient of [place]vak, Berneda Rose, MD. The CCM team was consulted to assist the patient with chronic disease management and/or care coordination needs related to: Level of Care Concerns.   Engaged with patient by telephone for follow up visit in response to provider referral for social work chronic care management and care coordination services.   Consent to Services:  The patient was given information about Chronic Care Management services, agreed to services, and gave verbal consent prior to initiation of services.  Please see initial visit note for detailed documentation.   Patient agreed to services and consent obtained.   Assessment: Review of patient past medical history, allergies, medications, and health status, including review of relevant consultants reports was performed today as part of a comprehensive evaluation and provision of chronic care management and care coordination services.     SDOH (Social Determinants of Health) assessments and interventions performed:    Advanced Directives Status: Not addressed in this encounter.  CCM Care Plan  Allergies  Allergen Reactions   Codeine Sulfate Other (See Comments)    Indigestion and epigastric pain   Sertraline Hcl Other (See Comments)    REACTION: Worse, ? irritable    Outpatient Encounter Medications as of 03/18/2021  Medication Sig Note   aspirin EC 81 MG tablet Take 81 mg by mouth daily.    brimonidine (ALPHAGAN) 0.2 % ophthalmic solution Place 1 drop into both eyes 2 (two) times daily.    cholecalciferol (VITAMIN D) 1000 UNITS tablet Take 1,000 Units by mouth daily.    dorzolamide-timolol (COSOPT) 22.3-6.8 MG/ML ophthalmic solution Place 1 drop into both eyes 2 (two) times daily.    folic acid (FOLVITE) 1 MG tablet Take 1 tablet (1 mg  total) by mouth daily.    glucose blood (ONETOUCH VERIO) test strip Use to check blood sugar once a day. Dx Code E11.9    HYDROcodone-acetaminophen (NORCO) 5-325 MG tablet Take 1 tablet by mouth every 6 (six) hours as needed for moderate pain. (Patient not taking: Reported on 03/18/2021)    hydroxychloroquine (PLAQUENIL) 200 MG tablet Take 200 mg by mouth daily.    lisinopril (ZESTRIL) 10 MG tablet Take 1 tablet (10 mg total) by mouth daily.    methotrexate (RHEUMATREX) 2.5 MG tablet Take 7 tablets (17.5 mg total) by mouth once a week. Caution:Chemotherapy. Protect from light. 02/27/2021: Friday    OneTouch Delica Lancets 33G MISC 1 each by Does not apply route daily. Dx Code E11.9    QUEtiapine (SEROQUEL) 25 MG tablet Take 0.5-1 tablets (12.5-25 mg total) by mouth at bedtime. (Patient taking differently: Take 25 mg by mouth at bedtime.)    vitamin B-12 (CYANOCOBALAMIN) 1000 MCG tablet Take 1,000 mcg by mouth daily.    vitamin E 400 UNIT capsule Take 400 Units by mouth daily.    ziprasidone (GEODON) 20 MG capsule Take 1 capsule (20 mg total) by mouth in the morning and at bedtime. (Patient taking differently: Take 20 mg by mouth at bedtime.)    No facility-administered encounter medications on file as of 03/18/2021.    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   Seropositive rheumatoid arthritis (HCC) 12/28/2015   Glaucoma 04/30/2015   Advance directive discussed with patient 04/30/2015   Routine general medical examination at a health care facility 12/06/2012   CAROTID BRUIT 11/08/2010   Bipolar 1 disorder, manic, full remission (HCC) 10/01/2009   OSTEOPENIA  07/26/2007   Hyperlipemia 01/22/2007   DIVERTICULOSIS, COLON 01/08/2007   Chronic back pain 01/08/2007    Conditions to be addressed/monitored:  falls/shoulder injury and wrist fx  ; Level of care concerns  Care Plan : LCSW Plan of Care  Updates made by Buck Mam, LCSW since 03/18/2021 12:00 AM     Problem: Functional Decline and need for support/services   Priority: High     Long-Range Goal: To provide and develop support services to enhance pt's safety and quality ot life   Start Date: 03/17/2021  Expected End Date: 05/10/2021  This Visit's Progress: On track  Recent Progress: On track  Priority: High  Note:   Current barriers:    Level of care concerns, ADL IADL limitations, Inability to perform ADL's independently, Inability to perform IADL's independently, and Lacks knowledge of community resources Clinical Goals: Patient will work with team to address needs related to recent falls with impairment, long term care planning Clinical Interventions:  CSW was able to coordinate with PCP completion of FL2 and they have faxed to [facility] per family request.  CSW has also submitted some clinical notes and PASSR # to them.  CSW talked with daughter [name] and encouraged her to be thinking of a back up plan in case this SNF is unable to accept. Also, a back up plan in case the SNF auth is not received.  Collaboration with Karie Schwalbe, MD regarding development and update of comprehensive plan of care as evidenced by provider attestation and co-signature Inter-disciplinary care team collaboration (see longitudinal plan of care) Assessment of needs, barriers , agencies contacted, as well as how impacting  Review various resources, discussed options and provided patient information about Private pay options home health needs and Discussed several options for long term counseling based on need and insurance Patient interviewed and appropriate assessments  performed Discussed plans with patient for ongoing care management follow up and provided patient with direct contact information for care management team Advised patient to discuss and determine plan of care options; home with [facility] and caregiver support, ALF or SNF Collaborated with primary care provider re: need for Home Health orders to be processed to agency to provide HHPT,OT,RN,SW and bath aide Provided education to patient/caregiver regarding level of care options. Clinical interventions provided:Solution-Focused Strategies, Active listening / Reflection utilized , Problem Solving [facility] , and Consideration of in-home help encouraged  Patient Goals/Self-Care Activities: Over the next 20 days Call your insurance provider for more information about your Enhanced Benefits  Call [facility] to follow up on admission options Continue with therapy follow up on ALF vs SNF options expect to hear from home health provider begin a notebook of services in my neighborhood or community call 211 when I need some help follow-up on any referrals for help I am given think ahead to make sure my need does not become an emergency -make a note about what I need to have by the phone or take with me, like an identification card or social security number have a back-up plan have a back-up  plan make a list of family or friends that I can call         Follow Up Plan: Appointment scheduled for SW follow up with client by phone on: 03/21/21      Reece Levy MSW, LCSW Licensed Clinical Social Worker [facility] [place] [phone]

## 2021-03-18 NOTE — Telephone Encounter (Signed)
Pt's daughter, [name], left a VM on triage line asking asking if the [facility] form for pt has been filled out and faxed in yet. [name] states that she would like to be notified when this has been completed, and states the sooner the better.

## 2021-03-18 NOTE — Telephone Encounter (Signed)
Form done Okay to fax $29 charge

## 2021-03-18 NOTE — Patient Instructions (Signed)
Visit Information  PATIENT GOALS:  Goals Addressed   None     The patient verbalized understanding of instructions, educational materials, and care plan provided today and declined offer to receive copy of patient instructions, educational materials, and care plan.   Telephone follow up appointment with care management team member scheduled for:03/21/21  Reece Levy MSW, LCSW Licensed Clinical Social Worker [facility] [place] [phone] Visit Information

## 2021-03-21 ENCOUNTER — Ambulatory Visit: Payer: Medicare Other | Admitting: *Deleted

## 2021-03-21 DIAGNOSIS — R269 Unspecified abnormalities of gait and mobility: Secondary | ICD-10-CM

## 2021-03-21 DIAGNOSIS — R4182 Altered mental status, unspecified: Secondary | ICD-10-CM

## 2021-03-21 DIAGNOSIS — E1159 Type 2 diabetes mellitus with other circulatory complications: Secondary | ICD-10-CM

## 2021-03-21 DIAGNOSIS — S62102B Fracture of unspecified carpal bone, left wrist, initial encounter for open fracture: Secondary | ICD-10-CM

## 2021-03-21 NOTE — Patient Instructions (Signed)
Visit Information  PATIENT GOALS:  Goals Addressed   None     The patient verbalized understanding of instructions, educational materials, and care plan provided today and declined offer to receive copy of patient instructions, educational materials, and care plan.   Telephone follow up appointment with care management team member scheduled for:03/28/21  Ledell Codrington MSW, LCSW Licensed Clinical Social Worker [facility] [phone]  

## 2021-03-21 NOTE — Chronic Care Management (AMB) (Signed)
Chronic Care Management    Clinical Social Work Note  03/21/2021 Name: Diana Roberson MRN: [id number] DOB: 1946/06/24  Diana Roberson is a 75 y.o. year old female who is a primary care patient of Letvak, Berneda Rose, MD. The CCM team was consulted to assist the patient with chronic disease management and/or care coordination needs related to: Level of Care Concerns.   Engaged with patient by telephone for follow up visit in response to provider referral for social work chronic care management and care coordination services.   Consent to Services:  The patient was given information about Chronic Care Management services, agreed to services, and gave verbal consent prior to initiation of services.  Please see initial visit note for detailed documentation.   Patient agreed to services and consent obtained.   Assessment: Review of patient past medical history, allergies, medications, and health status, including review of relevant consultants reports was performed today as part of a comprehensive evaluation and provision of chronic care management and care coordination services.     SDOH (Social Determinants of Health) assessments and interventions performed:    Advanced Directives Status: Not addressed in this encounter.  CCM Care Plan  Allergies  Allergen Reactions   Codeine Sulfate Other (See Comments)    Indigestion and epigastric pain   Sertraline Hcl Other (See Comments)    REACTION: Worse, ? irritable    Outpatient Encounter Medications as of 03/21/2021  Medication Sig Note   aspirin EC 81 MG tablet Take 81 mg by mouth daily.    brimonidine (ALPHAGAN) 0.2 % ophthalmic solution Place 1 drop into both eyes 2 (two) times daily.    cholecalciferol (VITAMIN D) 1000 UNITS tablet Take 1,000 Units by mouth daily.    dorzolamide-timolol (COSOPT) 22.3-6.8 MG/ML ophthalmic solution Place 1 drop into both eyes 2 (two) times daily.    folic acid (FOLVITE) 1 MG tablet Take 1 tablet (1 mg  total) by mouth daily.    glucose blood (ONETOUCH VERIO) test strip Use to check blood sugar once a day. Dx Code E11.9    HYDROcodone-acetaminophen (NORCO) 5-325 MG tablet Take 1 tablet by mouth every 6 (six) hours as needed for moderate pain. (Patient not taking: Reported on 03/18/2021)    hydroxychloroquine (PLAQUENIL) 200 MG tablet Take 200 mg by mouth daily.    lisinopril (ZESTRIL) 10 MG tablet Take 1 tablet (10 mg total) by mouth daily.    methotrexate (RHEUMATREX) 2.5 MG tablet Take 7 tablets (17.5 mg total) by mouth once a week. Caution:Chemotherapy. Protect from light. 02/27/2021: Friday    OneTouch Delica Lancets 33G MISC 1 each by Does not apply route daily. Dx Code E11.9    QUEtiapine (SEROQUEL) 25 MG tablet Take 0.5-1 tablets (12.5-25 mg total) by mouth at bedtime. (Patient taking differently: Take 25 mg by mouth at bedtime.)    vitamin B-12 (CYANOCOBALAMIN) 1000 MCG tablet Take 1,000 mcg by mouth daily.    vitamin E 400 UNIT capsule Take 400 Units by mouth daily.    ziprasidone (GEODON) 20 MG capsule Take 1 capsule (20 mg total) by mouth in the morning and at bedtime. (Patient taking differently: Take 20 mg by mouth at bedtime.)    No facility-administered encounter medications on file as of 03/21/2021.    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   Seropositive rheumatoid arthritis (HCC) 12/28/2015   Glaucoma 04/30/2015   Advance directive discussed with patient 04/30/2015   Routine general medical examination at a health care facility 12/06/2012   CAROTID BRUIT 11/08/2010   Bipolar 1 disorder, manic, full remission (HCC) 10/01/2009   OSTEOPENIA  07/26/2007   Hyperlipemia 01/22/2007   DIVERTICULOSIS, COLON 01/08/2007   Chronic back pain 01/08/2007    Conditions to be addressed/monitored: Dementia and recent falls with ortho injury ; Level of care concerns  Care Plan : LCSW Plan of Care  Updates made by Buck Mam, LCSW since 03/21/2021 12:00 AM     Problem: Functional Decline and need for support/services   Priority: High     Long-Range Goal: To provide and develop support services to enhance pt's safety and quality ot life   Start Date: 03/17/2021  Expected End Date: 05/10/2021  This Visit's Progress: On track  Recent Progress: On track  Priority: High  Note:   Current barriers:    Level of care concerns, ADL IADL limitations, Inability to perform ADL's independently, Inability to perform IADL's independently, and Lacks knowledge of community resources Clinical Goals: Patient will work with team to address needs related to recent falls with impairment, long term care planning Clinical Interventions:  Daughter reports her mother was declined SNF bed at facility they were hoping to seek SNF rehab for pt. Daughter also shares that they paid privately over the weekend to have 24/7 care with mom (Thurs- Sunday at $600). Family is now planning to tour and consider ALF options again; going to visit [facility] tomorrow. CSW advised daughter to update CSW once they know if this option is going to be appropriate. Also, awaiting Home Health follow up.    Collaboration with Karie Schwalbe, MD regarding development and update of comprehensive plan of care as evidenced by provider attestation and co-signature Inter-disciplinary care team collaboration (see longitudinal plan of care) Assessment of needs, barriers , agencies contacted, as well as how impacting  Review various resources, discussed options and provided patient information about Private pay options home health needs and Discussed several options for long term  counseling based on need and insurance Patient interviewed and appropriate assessments performed Discussed plans with patient for ongoing care management follow up and provided patient with direct contact information for care management team Advised patient to discuss and determine plan of care options; home with [facility] and caregiver support, ALF or SNF Collaborated with primary care provider re: need for Home Health orders to be processed to agency to provide HHPT,OT,RN,SW and bath aide Provided education to patient/caregiver regarding level of care options. Clinical interventions provided:Solution-Focused Strategies, Active listening / Reflection utilized , Problem Solving /Task Center , and Consideration of in-home help encouraged  Patient Goals/Self-Care Activities:  -follow up on ALF vs SNF options -expect to hear from home health provider - begin a notebook of services in my neighborhood or community - call 211 when I need some help - follow-up on any referrals for help I am given - think ahead to make sure my need does not become an emergency - make a note about what I need to have by the phone or take with me, like an identification card or social security number have a back-up plan - have a back-up plan - make a list of family or friends that I can  call         Follow Up Plan: Appointment scheduled for SW follow up with client by phone on: 03/29/21      Reece Levy MSW, LCSW Licensed Clinical Social Worker [facility] [phone]

## 2021-03-22 DIAGNOSIS — M059 Rheumatoid arthritis with rheumatoid factor, unspecified: Secondary | ICD-10-CM | POA: Diagnosis not present

## 2021-03-22 DIAGNOSIS — R270 Ataxia, unspecified: Secondary | ICD-10-CM | POA: Diagnosis not present

## 2021-03-22 DIAGNOSIS — Z79899 Other long term (current) drug therapy: Secondary | ICD-10-CM | POA: Diagnosis not present

## 2021-03-22 DIAGNOSIS — M8000XD Age-related osteoporosis with current pathological fracture, unspecified site, subsequent encounter for fracture with routine healing: Secondary | ICD-10-CM | POA: Diagnosis not present

## 2021-03-22 NOTE — Telephone Encounter (Signed)
Noted  

## 2021-03-23 ENCOUNTER — Telehealth: Payer: Self-pay

## 2021-03-23 NOTE — Telephone Encounter (Signed)
Open in error

## 2021-03-24 DIAGNOSIS — S52502D Unspecified fracture of the lower end of left radius, subsequent encounter for closed fracture with routine healing: Secondary | ICD-10-CM | POA: Diagnosis not present

## 2021-03-28 ENCOUNTER — Telehealth: Payer: Medicare Other

## 2021-03-29 ENCOUNTER — Telehealth: Payer: Self-pay | Admitting: *Deleted

## 2021-03-29 DIAGNOSIS — S52502D Unspecified fracture of the lower end of left radius, subsequent encounter for closed fracture with routine healing: Secondary | ICD-10-CM | POA: Diagnosis not present

## 2021-03-29 DIAGNOSIS — Z09 Encounter for follow-up examination after completed treatment for conditions other than malignant neoplasm: Secondary | ICD-10-CM | POA: Diagnosis not present

## 2021-03-29 NOTE — Telephone Encounter (Signed)
  Care Management   Follow Up Note   03/29/2021 Name: Diana Roberson MRN: [id number] DOB: [birth year]   Referred by: Karie Schwalbe, MD Reason for referral : No chief complaint on file.   An unsuccessful telephone outreach was attempted today. The patient was referred to the case management team for assistance with care management and care coordination.   Follow Up Plan: Telephone follow up appointment with care management team member scheduled for: 7-10 days  Reece Levy MSW, LCSW Licensed Clinical Social Worker [facility] [phone]

## 2021-04-04 ENCOUNTER — Ambulatory Visit: Payer: Medicare Other | Admitting: *Deleted

## 2021-04-04 DIAGNOSIS — R269 Unspecified abnormalities of gait and mobility: Secondary | ICD-10-CM

## 2021-04-04 DIAGNOSIS — E1159 Type 2 diabetes mellitus with other circulatory complications: Secondary | ICD-10-CM

## 2021-04-04 DIAGNOSIS — S62102B Fracture of unspecified carpal bone, left wrist, initial encounter for open fracture: Secondary | ICD-10-CM

## 2021-04-04 DIAGNOSIS — R4182 Altered mental status, unspecified: Secondary | ICD-10-CM

## 2021-04-04 NOTE — Chronic Care Management (AMB) (Signed)
Chronic Care Management    Clinical Social Work Note  04/04/2021 Name: Diana Roberson MRN: [id number] DOB: 04/30/1946  Diana Roberson is a 75 y.o. year old female who is a primary care patient of Letvak, Berneda Rose, MD. The CCM team was consulted to assist the patient with chronic disease management and/or care coordination needs related to: Level of Care Concerns.   Engaged with patient by telephone for follow up visit in response to provider referral for social work chronic care management and care coordination services.   Consent to Services:  The patient was given information about Chronic Care Management services, agreed to services, and gave verbal consent prior to initiation of services.  Please see initial visit note for detailed documentation.   Patient agreed to services and consent obtained.   Assessment: Review of patient past medical history, allergies, medications, and health status, including review of relevant consultants reports was performed today as part of a comprehensive evaluation and provision of chronic care management and care coordination services.     SDOH (Social Determinants of Health) assessments and interventions performed:    Advanced Directives Status: Not addressed in this encounter.  CCM Care Plan  Allergies  Allergen Reactions   Codeine Sulfate Other (See Comments)    Indigestion and epigastric pain   Sertraline Hcl Other (See Comments)    REACTION: Worse, ? irritable    Outpatient Encounter Medications as of 04/04/2021  Medication Sig Note   aspirin EC 81 MG tablet Take 81 mg by mouth daily.    brimonidine (ALPHAGAN) 0.2 % ophthalmic solution Place 1 drop into both eyes 2 (two) times daily.    cholecalciferol (VITAMIN D) 1000 UNITS tablet Take 1,000 Units by mouth daily.    dorzolamide-timolol (COSOPT) 22.3-6.8 MG/ML ophthalmic solution Place 1 drop into both eyes 2 (two) times daily.    folic acid (FOLVITE) 1 MG tablet Take 1 tablet (1 mg  total) by mouth daily.    glucose blood (ONETOUCH VERIO) test strip Use to check blood sugar once a day. Dx Code E11.9    HYDROcodone-acetaminophen (NORCO) 5-325 MG tablet Take 1 tablet by mouth every 6 (six) hours as needed for moderate pain. (Patient not taking: Reported on 03/18/2021)    hydroxychloroquine (PLAQUENIL) 200 MG tablet Take 200 mg by mouth daily.    lisinopril (ZESTRIL) 10 MG tablet Take 1 tablet (10 mg total) by mouth daily.    methotrexate (RHEUMATREX) 2.5 MG tablet Take 7 tablets (17.5 mg total) by mouth once a week. Caution:Chemotherapy. Protect from light. 02/27/2021: Friday    OneTouch Delica Lancets 33G MISC 1 each by Does not apply route daily. Dx Code E11.9    QUEtiapine (SEROQUEL) 25 MG tablet Take 0.5-1 tablets (12.5-25 mg total) by mouth at bedtime. (Patient taking differently: Take 25 mg by mouth at bedtime.)    vitamin B-12 (CYANOCOBALAMIN) 1000 MCG tablet Take 1,000 mcg by mouth daily.    vitamin E 400 UNIT capsule Take 400 Units by mouth daily.    ziprasidone (GEODON) 20 MG capsule Take 1 capsule (20 mg total) by mouth in the morning and at bedtime. (Patient taking differently: Take 20 mg by mouth at bedtime.)    No facility-administered encounter medications on file as of 04/04/2021.    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   Seropositive rheumatoid arthritis (HCC) 12/28/2015   Glaucoma 04/30/2015   Advance directive discussed with patient 04/30/2015   Routine general medical examination at a health care facility 12/06/2012   CAROTID BRUIT 11/08/2010   Bipolar 1 disorder, manic, full remission (HCC) 10/01/2009   OSTEOPENIA  07/26/2007   Hyperlipemia 01/22/2007   DIVERTICULOSIS, COLON 01/08/2007   Chronic back pain 01/08/2007    Conditions to be addressed/monitored: Dementia; Level of care concerns  Care Plan : LCSW Plan of Care  Updates made by Buck Mam, LCSW since 04/04/2021 12:00 AM     Problem: Functional Decline and need for support/services   Priority: High     Long-Range Goal: To provide and develop support services to enhance pt's safety and quality ot life   Start Date: 03/17/2021  Expected End Date: 05/10/2021  This Visit's Progress: On track  Recent Progress: On track  Priority: High  Note:   Current barriers:    Level of care concerns, ADL IADL limitations, Inability to perform ADL's independently, Inability to perform IADL's independently, and Lacks knowledge of community resources Clinical Goals: Patient will work with team to address needs related to recent falls with impairment, long term care planning Clinical Interventions:  Daughter reports they have put down a deposit at ALF, [facility], and are preparing/decorating for her move soon. "I want to have the room looking less drab before she moves in". Per daughter, they have also spoken with an Elder Sales executive and determined she will be private pay (not eligible for Medicaid for a while).    Collaboration with Karie Schwalbe, MD regarding development and update of comprehensive plan of care as evidenced by provider attestation and co-signature Inter-disciplinary care team collaboration (see longitudinal plan of care) Assessment of needs, barriers , agencies contacted, as well as how impacting  Review various resources, discussed options and provided patient information about Private pay options home health needs and Discussed several options for long term counseling based on need and insurance Patient interviewed and appropriate assessments performed Discussed plans with patient for ongoing care management follow up and  provided patient with direct contact information for care management team Advised patient to discuss and determine plan of care options; home with [facility] and caregiver support, ALF or SNF Collaborated with primary care provider re: need for Home Health orders to be processed to agency to provide HHPT,OT,RN,SW and bath aide Provided education to patient/caregiver regarding level of care options. Clinical interventions provided:Solution-Focused Strategies, Active listening / Reflection utilized , Problem Solving [facility] , and Consideration of in-home help encouraged  Patient Goals/Self-Care Activities:  -follow up on ALF plans  -expect to hear from home health provider - begin a notebook of services in my neighborhood or community - call 211 when I need some help - follow-up on any referrals for help I am given - think ahead to make sure my need does not become an emergency - make a note about what I need to have by the phone or take with me, like an identification card or social security number have a back-up plan - have a back-up plan - make a list of family or friends that I can call         Follow Up Plan: SW will follow up with patient by phone over the next 3-4 weeks  Eduard Clos MSW, LCSW Licensed Clinical Social Worker [place] [phone]

## 2021-04-04 NOTE — Patient Instructions (Addendum)
Visit Information  PATIENT GOALS:  Goals Addressed   None     The patient verbalized understanding of instructions, educational materials, and care plan provided today and declined offer to receive copy of patient instructions, educational materials, and care plan.   Telephone follow up appointment with care management team member scheduled for:04/28/21 Alexy Bringle MSW, LCSW Licensed Clinical Social Worker [facility] [phone]  

## 2021-04-06 DIAGNOSIS — S52502D Unspecified fracture of the lower end of left radius, subsequent encounter for closed fracture with routine healing: Secondary | ICD-10-CM | POA: Diagnosis not present

## 2021-04-11 ENCOUNTER — Telehealth: Payer: Self-pay

## 2021-04-11 NOTE — Telephone Encounter (Signed)
Left message on VM for Daughter that Dr Alphonsus Sias just got in today and he signed the medication change form from [facility]. I faxed it back within the last hour.

## 2021-04-11 NOTE — Telephone Encounter (Signed)
Pt's daughter called triage and left a message (without patient info given) for me to call her back about a medication issue.

## 2021-04-18 ENCOUNTER — Telehealth: Payer: Self-pay

## 2021-04-18 NOTE — Telephone Encounter (Signed)
[facility] Night - Client TELEPHONE ADVICE RECORD AccessNurse Patient Name: Diana Roberson Gender: Female DOB: [birth year] Age: 75 Y 1 M 18 D Return Phone Number: [phone] (Primary), [phone] (Secondary) Address: City/ State/ Zip: [place] Client [facility] Night - Client Client Site [facility] - Night Physician Tillman Abide- MD Contact Type Call Who Is Calling Nursing Home / Skilled Nursing Facility Call Type Triage / Clinical Caller Name Chelsea Relationship To Patient Other Return Phone Number [phone] (Secondary) Facility Name [facility] Facility Number [phone] Chief Complaint Fall - No Injury Reason for Call Report a patient fall Initial Comment Caller is caller from [facility] assit living. Reporting a fall. No injuries. Arm is already in a cast. Translation No Nurse Assessment Nurse: Mat Carne, RN, Judeth Cornfield Date/Time (Eastern Time): 04/17/2021 6:37:57 AM Confirm and document reason for call. If symptomatic, describe symptoms. ---Caller states that the patient fell in her bathroom, laying on her back when; No injuries. left arm is already in a cast. denies other symptoms Does the patient have any new or worsening symptoms? ---Yes Will a triage be completed? ---Yes Related visit to physician within the last 2 weeks? ---Yes Does the PT have any chronic conditions? (i.e. diabetes, asthma, this includes High risk factors for pregnancy, etc.) ---Yes List chronic conditions. ---diabetes Is this a behavioral health or substance abuse call? ---No Guidelines Guideline Title Affirmed Question Affirmed Notes Nurse Date/Time (Eastern Time) Falls and Falling [1] Unable to get up until help (e.g., caregiver, family, friend) arrived AND [2] on the ground 1 hour or more Diana Roberson 04/17/2021 6:41:29 AM PLEASE NOTE: All timestamps contained within this report are  represented as [place] Standard Time. CONFIDENTIALTY NOTICE: This fax transmission is intended only for the addressee. It contains information that is legally privileged, confidential or otherwise protected from use or disclosure. If you are not the intended recipient, you are strictly prohibited from reviewing, disclosing, copying using or disseminating any of this information or taking any action in reliance on or regarding this information. If you have received this fax in error, please notify Diana Roberson immediately by telephone so that we can arrange for its return to Diana Roberson. Phone: [phone], Toll-Free: [phone], Fax: [phone] Page: 2 of 2 Call Id: [id number] Disp. Time Lamount Cohen Time) Disposition Final User 04/17/2021 6:45:19 AM Called On-Call Provider Mat Carne, RN, Judeth Cornfield 04/17/2021 6:43:03 AM Go to ED Now (or PCP triage) Cathie Hoops, RN, Loura Halt Disagree/Comply Disagree Caller Understands Yes PreDisposition InappropriateToAsk Care Advice Given Per Guideline GO TO ED NOW (OR PCP TRIAGE): * IF NO PCP (PRIMARY CARE PROVIDER) SECOND-LEVEL TRIAGE: You need to be seen within the next hour. Go to the ED/UCC at _____________ Hospital. Leave as soon as you can. CARE ADVICE given per Fall and Falling (Adult) guideline. Comments User: [name], RN Date/Time Lamount Cohen Time): 04/17/2021 6:48:12 AM caller updated on provider response; verbalized understanding Referrals GO TO FACILITY UNDECIDED Paging DoctorName Phone DateTime Result/ Outcome Message Type Notes Diana Roberson- MD [phone] 04/17/2021 6:45:19 AM Called On Call Provider - Reached Doctor Paged Diana Roberson- MD 04/17/2021 6:45:50 AM Spoke with On Call - General Message Result report given to provider; to ER for eval; RN to update caller

## 2021-04-18 NOTE — Telephone Encounter (Signed)
Spoke to [employer] at [employer]. Relayed Dr Karle Starch message. She stated the pt is doing well.

## 2021-04-20 ENCOUNTER — Telehealth: Payer: Self-pay | Admitting: *Deleted

## 2021-04-20 NOTE — Telephone Encounter (Signed)
Faxed order to [phone]  Left message on daughter's VM advising it has been faxed.

## 2021-04-20 NOTE — Telephone Encounter (Signed)
Patient's daughter Arminda Resides left a voicemail stating that her mom is at [facility]. [name] stated that her mom needs some Miralax and she was just going to take her mom some. [name] stated that an order needs to be given to [facility] in order for her mom to be able to take Miralax. Kim requested a call back about this.

## 2021-04-25 ENCOUNTER — Telehealth: Payer: Self-pay | Admitting: *Deleted

## 2021-04-25 ENCOUNTER — Telehealth: Payer: Self-pay | Admitting: Internal Medicine

## 2021-04-25 NOTE — Telephone Encounter (Signed)
Spoke to daughter. I may have overlooked lisinopril for the FL2 as we were doing it in a hurry. We can send an order to [facility] for the lisinopril 10 mg daily and Aleve as needed.

## 2021-04-25 NOTE — Telephone Encounter (Signed)
Adio called in from [facility] asking for PT and OT and they faxed the order on 8/3 and 8/10.

## 2021-04-25 NOTE — Telephone Encounter (Signed)
Patient's daughter Diana Roberson left a voicemail stating that she needs Diana Roberson CMA to call her back. Diana Roberson stated that [facility] needs to know that her mom is no longer on Lisinopril and it is okay for her to take Aleve if needed.

## 2021-04-25 NOTE — Telephone Encounter (Signed)
Added lisinopril and Aleve to the order with Miralax and faxed back to [facility]

## 2021-04-28 ENCOUNTER — Telehealth: Payer: Self-pay | Admitting: *Deleted

## 2021-04-28 ENCOUNTER — Telehealth: Payer: Medicare Other

## 2021-04-28 DIAGNOSIS — S52502D Unspecified fracture of the lower end of left radius, subsequent encounter for closed fracture with routine healing: Secondary | ICD-10-CM | POA: Diagnosis not present

## 2021-04-28 NOTE — Telephone Encounter (Signed)
  Care Management   Follow Up Note   04/28/2021 Name: MANASVI DICKARD MRN: [id number] DOB: 08-08-1946   Referred by: Karie Schwalbe, MD Reason for referral : No chief complaint on file.   An unsuccessful telephone outreach was attempted today. The patient was referred to the case management team for assistance with care management and care coordination.   Follow Up Plan: Telephone follow up appointment with care management team member scheduled for: the next 10-14 days.  Reece Levy MSW, LCSW Licensed Clinical Social Worker [facility] [place] [phone]

## 2021-05-03 DIAGNOSIS — H34831 Tributary (branch) retinal vein occlusion, right eye, with macular edema: Secondary | ICD-10-CM | POA: Diagnosis not present

## 2021-05-03 DIAGNOSIS — H401132 Primary open-angle glaucoma, bilateral, moderate stage: Secondary | ICD-10-CM | POA: Diagnosis not present

## 2021-05-06 ENCOUNTER — Telehealth: Payer: Self-pay | Admitting: Internal Medicine

## 2021-05-06 NOTE — Telephone Encounter (Signed)
Diana Roberson called in and stated mom had a change of plans and she is moved to independent living in [place] and they have [facility] and wanted to know about getting orders for PT. And the fax number is [phone] , and the [facility] [phone]

## 2021-05-09 NOTE — Telephone Encounter (Signed)
It was for the providers that services [facility]. This is a different provider.

## 2021-05-09 NOTE — Telephone Encounter (Signed)
Spoke to [place], PT at [employer]. She will be sending a fax with orders to sign. Spoke to daughter. They will be changing to either [facility] or a local provider. They are working on the paperwork right now.

## 2021-05-10 ENCOUNTER — Telehealth: Payer: Medicare Other

## 2021-05-10 ENCOUNTER — Telehealth: Payer: Self-pay | Admitting: *Deleted

## 2021-05-11 DIAGNOSIS — M069 Rheumatoid arthritis, unspecified: Secondary | ICD-10-CM | POA: Diagnosis not present

## 2021-05-11 DIAGNOSIS — M6281 Muscle weakness (generalized): Secondary | ICD-10-CM | POA: Diagnosis not present

## 2021-05-11 DIAGNOSIS — R2689 Other abnormalities of gait and mobility: Secondary | ICD-10-CM | POA: Diagnosis not present

## 2021-05-12 DIAGNOSIS — R2689 Other abnormalities of gait and mobility: Secondary | ICD-10-CM | POA: Diagnosis not present

## 2021-05-12 DIAGNOSIS — M6281 Muscle weakness (generalized): Secondary | ICD-10-CM | POA: Diagnosis not present

## 2021-05-12 DIAGNOSIS — M20001 Unspecified deformity of right finger(s): Secondary | ICD-10-CM | POA: Diagnosis not present

## 2021-05-12 DIAGNOSIS — M069 Rheumatoid arthritis, unspecified: Secondary | ICD-10-CM | POA: Diagnosis not present

## 2021-05-12 DIAGNOSIS — M20002 Unspecified deformity of left finger(s): Secondary | ICD-10-CM | POA: Diagnosis not present

## 2021-05-12 NOTE — Telephone Encounter (Signed)
  Care Management   Follow Up Note   05/12/2021 Name: ARIN VANOSDOL MRN: [id number] DOB: 1945-10-22   Referred by: Karie Schwalbe, MD Reason for referral : No chief complaint on file.   A second unsuccessful telephone outreach was attempted today. The patient was referred to the case management team for assistance with care management and care coordination.   Follow Up Plan: The patient has been provided with contact information for the care management team and has been advised to call with any health related questions or concerns.  Will outreach again in the next 3-4 weeks if no return call received/ Reece Levy MSW, LCSW Licensed Clinical Social Worker [facility] [place] [phone]

## 2021-05-13 DIAGNOSIS — G3184 Mild cognitive impairment, so stated: Secondary | ICD-10-CM | POA: Diagnosis not present

## 2021-05-17 DIAGNOSIS — M6281 Muscle weakness (generalized): Secondary | ICD-10-CM | POA: Diagnosis not present

## 2021-05-17 DIAGNOSIS — R2689 Other abnormalities of gait and mobility: Secondary | ICD-10-CM | POA: Diagnosis not present

## 2021-05-17 DIAGNOSIS — M069 Rheumatoid arthritis, unspecified: Secondary | ICD-10-CM | POA: Diagnosis not present

## 2021-05-18 DIAGNOSIS — M069 Rheumatoid arthritis, unspecified: Secondary | ICD-10-CM | POA: Diagnosis not present

## 2021-05-18 DIAGNOSIS — R2689 Other abnormalities of gait and mobility: Secondary | ICD-10-CM | POA: Diagnosis not present

## 2021-05-18 DIAGNOSIS — M6281 Muscle weakness (generalized): Secondary | ICD-10-CM | POA: Diagnosis not present

## 2021-05-18 DIAGNOSIS — G3184 Mild cognitive impairment, so stated: Secondary | ICD-10-CM | POA: Diagnosis not present

## 2021-05-19 DIAGNOSIS — M6281 Muscle weakness (generalized): Secondary | ICD-10-CM | POA: Diagnosis not present

## 2021-05-19 DIAGNOSIS — R2689 Other abnormalities of gait and mobility: Secondary | ICD-10-CM | POA: Diagnosis not present

## 2021-05-19 DIAGNOSIS — M20002 Unspecified deformity of left finger(s): Secondary | ICD-10-CM | POA: Diagnosis not present

## 2021-05-19 DIAGNOSIS — M20001 Unspecified deformity of right finger(s): Secondary | ICD-10-CM | POA: Diagnosis not present

## 2021-05-19 DIAGNOSIS — M069 Rheumatoid arthritis, unspecified: Secondary | ICD-10-CM | POA: Diagnosis not present

## 2021-05-20 ENCOUNTER — Telehealth: Payer: Self-pay

## 2021-05-20 DIAGNOSIS — G3184 Mild cognitive impairment, so stated: Secondary | ICD-10-CM | POA: Diagnosis not present

## 2021-05-20 DIAGNOSIS — M6281 Muscle weakness (generalized): Secondary | ICD-10-CM | POA: Diagnosis not present

## 2021-05-20 DIAGNOSIS — M20001 Unspecified deformity of right finger(s): Secondary | ICD-10-CM | POA: Diagnosis not present

## 2021-05-20 DIAGNOSIS — M20002 Unspecified deformity of left finger(s): Secondary | ICD-10-CM | POA: Diagnosis not present

## 2021-05-20 NOTE — Progress Notes (Addendum)
Chronic Care Management Pharmacy Assistant   Name: Diana Roberson  MRN: [id number] DOB: [birth year]   Reason for Encounter: Hyperlipidemia Medication Adherence   Recent office visits:  05/06/2021 - Tillman Abide, MD - Patient's daughter called in stating patient is moved to independent living in [place] and they have [facility]. Dr. Alphonsus Sias, MD advised they find a new provider since patient will be living in [place] now.  04/25/2021  - Tillman Abide, MD - Faxed orders to [facility] to start Aleve (daughter will provide) and orders that patient is on Lisinopril 10mg . 04/20/2021 - 06/20/2021, MD - Faxed orders to [facility] to start Miralax (daughter will provide).  04/18/2021 - Telephone call from [facility] to PCP - Caller states that the patient fell in her bathroom, laying on her back when; No injuries. left arm is already in a cast. Denies other symptoms 03/08/2021 - 03/10/2021, MD - ER follow-up Closed fracture of left wrist. Social work referral.   Recent consult visits: 04/04/2021 - 04/06/2021, LCSW - Telephone visit - Daughter reports they have put a deposit down at ALF, [facility]. Patient will be private pay.  03/29/2021 - 03/31/2021, PA - Postoperative Visit - No further information provided.  03/24/2021 - 03/26/2021, OTR - Closed fracture of distal end of radius - No further information provided.  03/22/2021 - 05/23/2021., MD - Patient presented for Rheumatoid Arthritis. Start: Fosamax 70 mg daily. Follow-up 6 months.  03/21/2021 - 05/22/2021, LCSW - Telephone visit - Daughter reports her mother was declined SNF bed at facility they were hoping to seek SNF rehab for pt. Daughter also shares that they paid privately over the weekend to have 24/7 care with mom (Thurs- Sunday at $600). Family is now planning to tour and consider ALF options again; going to visit [facility]. Family is unable to care for patient due  to falls.    03/10/2021 - 03/12/2021 - Patient presented for follow-up from ER visit. Surgery anticipted in the following days. Patient was change into a more supportive Ortho-Glass sugar tong split.   Hospital visits:  Not since last CCM contact   Medications: Outpatient Encounter Medications as of 05/20/2021  Medication Sig Note   aspirin EC 81 MG tablet Take 81 mg by mouth daily.    brimonidine (ALPHAGAN) 0.2 % ophthalmic solution Place 1 drop into both eyes 2 (two) times daily.    cholecalciferol (VITAMIN D) 1000 UNITS tablet Take 1,000 Units by mouth daily.    dorzolamide-timolol (COSOPT) 22.3-6.8 MG/ML ophthalmic solution Place 1 drop into both eyes 2 (two) times daily.    folic acid (FOLVITE) 1 MG tablet Take 1 tablet (1 mg total) by mouth daily.    glucose blood (ONETOUCH VERIO) test strip Use to check blood sugar once a day. Dx Code E11.9    HYDROcodone-acetaminophen (NORCO) 5-325 MG tablet Take 1 tablet by mouth every 6 (six) hours as needed for moderate pain. (Patient not taking: Reported on 03/18/2021)    hydroxychloroquine (PLAQUENIL) 200 MG tablet Take 200 mg by mouth daily.    lisinopril (ZESTRIL) 10 MG tablet Take 1 tablet (10 mg total) by mouth daily.    methotrexate (RHEUMATREX) 2.5 MG tablet Take 7 tablets (17.5 mg total) by mouth once a week. Caution:Chemotherapy. Protect from light. 02/27/2021: Friday    OneTouch Delica Lancets 33G MISC 1 each by Does not apply route daily. Dx Code E11.9  QUEtiapine (SEROQUEL) 25 MG tablet Take 0.5-1 tablets (12.5-25 mg total) by mouth at bedtime. (Patient taking differently: Take 25 mg by mouth at bedtime.)    vitamin B-12 (CYANOCOBALAMIN) 1000 MCG tablet Take 1,000 mcg by mouth daily.    vitamin E 400 UNIT capsule Take 400 Units by mouth daily.    ziprasidone (GEODON) 20 MG capsule Take 1 capsule (20 mg total) by mouth in the morning and at bedtime. (Patient taking differently: Take 20 mg by mouth at bedtime.)    No facility-administered  encounter medications on file as of 05/20/2021.   Contacted [facility] to review adherence to medications.  Patient is > 5 days past due for refill on the following medications per chart history:  Drug Name / Strength / Sig Atorvastatin 20mg  1 tablet by mouth daily   Patient is aware I am calling to review medications that show they are past due per insurance data.  Drug name Atorvastatin indicated for cholesterol.  Patient stated she is no longer taking Atorvastatin. Stated she has not been taking it for a few months, but did not know the exact time. Patient stopped taking it on her own as she felt she did not need it.   When was the prescription last filled? 11/16/2020 verified with [facility].  Care Gaps: Annual wellness visit in last year? Yes 07/19/2020 Most Recent BP reading: 118/60 on 05/17/2021  If Diabetic: Most recent A1C reading: 5.7 on 02/21/2021 Last eye exam / retinopathy screening: 11/04/2020 Last diabetic foot exam: 01/15/2020  STAR Medications Name   Last fill date   DS Lisinopril 10mg  05/09/2021 90  CCM Social Work appointment on 06/01/2021 PCP appointment on 08/25/2021  06/03/2021, CPP notified  08/27/2021, Phil Dopp Clinical Pharmacy Assistant [phone]  I have reviewed the care management and care coordination activities outlined in this encounter and I am certifying that I agree with the content of this note. PCP documented pt no longer taking 05/22 and atorvastatin no longer on med list. Will re-eval LDL periodically. At this time, diabetes is very controlled improving CV risk profile.  [phone], PharmD Clinical Pharmacist [facility] [phone]

## 2021-05-23 DIAGNOSIS — M069 Rheumatoid arthritis, unspecified: Secondary | ICD-10-CM | POA: Diagnosis not present

## 2021-05-23 DIAGNOSIS — M20001 Unspecified deformity of right finger(s): Secondary | ICD-10-CM | POA: Diagnosis not present

## 2021-05-23 DIAGNOSIS — M20002 Unspecified deformity of left finger(s): Secondary | ICD-10-CM | POA: Diagnosis not present

## 2021-05-23 DIAGNOSIS — M6281 Muscle weakness (generalized): Secondary | ICD-10-CM | POA: Diagnosis not present

## 2021-05-23 DIAGNOSIS — R2689 Other abnormalities of gait and mobility: Secondary | ICD-10-CM | POA: Diagnosis not present

## 2021-05-24 DIAGNOSIS — M20002 Unspecified deformity of left finger(s): Secondary | ICD-10-CM | POA: Diagnosis not present

## 2021-05-24 DIAGNOSIS — M20001 Unspecified deformity of right finger(s): Secondary | ICD-10-CM | POA: Diagnosis not present

## 2021-05-24 DIAGNOSIS — M6281 Muscle weakness (generalized): Secondary | ICD-10-CM | POA: Diagnosis not present

## 2021-05-25 DIAGNOSIS — G3184 Mild cognitive impairment, so stated: Secondary | ICD-10-CM | POA: Diagnosis not present

## 2021-05-26 DIAGNOSIS — R2689 Other abnormalities of gait and mobility: Secondary | ICD-10-CM | POA: Diagnosis not present

## 2021-05-26 DIAGNOSIS — M069 Rheumatoid arthritis, unspecified: Secondary | ICD-10-CM | POA: Diagnosis not present

## 2021-05-26 DIAGNOSIS — M20001 Unspecified deformity of right finger(s): Secondary | ICD-10-CM | POA: Diagnosis not present

## 2021-05-26 DIAGNOSIS — M20002 Unspecified deformity of left finger(s): Secondary | ICD-10-CM | POA: Diagnosis not present

## 2021-05-26 DIAGNOSIS — M6281 Muscle weakness (generalized): Secondary | ICD-10-CM | POA: Diagnosis not present

## 2021-05-27 DIAGNOSIS — R2689 Other abnormalities of gait and mobility: Secondary | ICD-10-CM | POA: Diagnosis not present

## 2021-05-27 DIAGNOSIS — M069 Rheumatoid arthritis, unspecified: Secondary | ICD-10-CM | POA: Diagnosis not present

## 2021-05-27 DIAGNOSIS — M6281 Muscle weakness (generalized): Secondary | ICD-10-CM | POA: Diagnosis not present

## 2021-05-28 DIAGNOSIS — W01198A Fall on same level from slipping, tripping and stumbling with subsequent striking against other object, initial encounter: Secondary | ICD-10-CM | POA: Diagnosis not present

## 2021-05-28 DIAGNOSIS — M6281 Muscle weakness (generalized): Secondary | ICD-10-CM | POA: Diagnosis not present

## 2021-05-28 DIAGNOSIS — E119 Type 2 diabetes mellitus without complications: Secondary | ICD-10-CM | POA: Diagnosis not present

## 2021-05-28 DIAGNOSIS — Z20822 Contact with and (suspected) exposure to covid-19: Secondary | ICD-10-CM | POA: Diagnosis not present

## 2021-05-28 DIAGNOSIS — S066X0A Traumatic subarachnoid hemorrhage without loss of consciousness, initial encounter: Secondary | ICD-10-CM | POA: Diagnosis not present

## 2021-05-28 DIAGNOSIS — R6889 Other general symptoms and signs: Secondary | ICD-10-CM | POA: Diagnosis not present

## 2021-05-28 DIAGNOSIS — R413 Other amnesia: Secondary | ICD-10-CM | POA: Diagnosis not present

## 2021-05-28 DIAGNOSIS — R2689 Other abnormalities of gait and mobility: Secondary | ICD-10-CM | POA: Diagnosis not present

## 2021-05-28 DIAGNOSIS — Z23 Encounter for immunization: Secondary | ICD-10-CM | POA: Diagnosis not present

## 2021-05-28 DIAGNOSIS — E118 Type 2 diabetes mellitus with unspecified complications: Secondary | ICD-10-CM | POA: Diagnosis not present

## 2021-05-28 DIAGNOSIS — W19XXXA Unspecified fall, initial encounter: Secondary | ICD-10-CM | POA: Diagnosis not present

## 2021-05-28 DIAGNOSIS — M069 Rheumatoid arthritis, unspecified: Secondary | ICD-10-CM | POA: Diagnosis not present

## 2021-05-28 DIAGNOSIS — S0001XA Abrasion of scalp, initial encounter: Secondary | ICD-10-CM | POA: Diagnosis not present

## 2021-05-28 DIAGNOSIS — S0003XA Contusion of scalp, initial encounter: Secondary | ICD-10-CM | POA: Diagnosis not present

## 2021-05-28 DIAGNOSIS — R58 Hemorrhage, not elsewhere classified: Secondary | ICD-10-CM | POA: Diagnosis not present

## 2021-05-28 DIAGNOSIS — S066X9A Traumatic subarachnoid hemorrhage with loss of consciousness of unspecified duration, initial encounter: Secondary | ICD-10-CM | POA: Diagnosis not present

## 2021-05-28 DIAGNOSIS — S0990XA Unspecified injury of head, initial encounter: Secondary | ICD-10-CM | POA: Diagnosis not present

## 2021-05-28 DIAGNOSIS — Z743 Need for continuous supervision: Secondary | ICD-10-CM | POA: Diagnosis not present

## 2021-05-28 DIAGNOSIS — I609 Nontraumatic subarachnoid hemorrhage, unspecified: Secondary | ICD-10-CM | POA: Diagnosis not present

## 2021-05-28 DIAGNOSIS — H409 Unspecified glaucoma: Secondary | ICD-10-CM | POA: Diagnosis not present

## 2021-05-28 DIAGNOSIS — Z7984 Long term (current) use of oral hypoglycemic drugs: Secondary | ICD-10-CM | POA: Diagnosis not present

## 2021-05-28 DIAGNOSIS — I10 Essential (primary) hypertension: Secondary | ICD-10-CM | POA: Diagnosis not present

## 2021-05-29 DIAGNOSIS — R269 Unspecified abnormalities of gait and mobility: Secondary | ICD-10-CM | POA: Diagnosis not present

## 2021-05-29 DIAGNOSIS — S066X0A Traumatic subarachnoid hemorrhage without loss of consciousness, initial encounter: Secondary | ICD-10-CM | POA: Diagnosis not present

## 2021-05-29 DIAGNOSIS — E1165 Type 2 diabetes mellitus with hyperglycemia: Secondary | ICD-10-CM | POA: Diagnosis not present

## 2021-05-29 DIAGNOSIS — W19XXXA Unspecified fall, initial encounter: Secondary | ICD-10-CM | POA: Diagnosis not present

## 2021-05-29 DIAGNOSIS — I609 Nontraumatic subarachnoid hemorrhage, unspecified: Secondary | ICD-10-CM | POA: Diagnosis not present

## 2021-05-29 DIAGNOSIS — R413 Other amnesia: Secondary | ICD-10-CM | POA: Diagnosis not present

## 2021-05-29 DIAGNOSIS — Z794 Long term (current) use of insulin: Secondary | ICD-10-CM | POA: Diagnosis not present

## 2021-05-29 DIAGNOSIS — I10 Essential (primary) hypertension: Secondary | ICD-10-CM | POA: Diagnosis not present

## 2021-05-30 DIAGNOSIS — M20001 Unspecified deformity of right finger(s): Secondary | ICD-10-CM | POA: Diagnosis not present

## 2021-05-30 DIAGNOSIS — M069 Rheumatoid arthritis, unspecified: Secondary | ICD-10-CM | POA: Diagnosis not present

## 2021-05-30 DIAGNOSIS — M20002 Unspecified deformity of left finger(s): Secondary | ICD-10-CM | POA: Diagnosis not present

## 2021-05-30 DIAGNOSIS — M6281 Muscle weakness (generalized): Secondary | ICD-10-CM | POA: Diagnosis not present

## 2021-05-30 DIAGNOSIS — R2689 Other abnormalities of gait and mobility: Secondary | ICD-10-CM | POA: Diagnosis not present

## 2021-05-31 DIAGNOSIS — M069 Rheumatoid arthritis, unspecified: Secondary | ICD-10-CM | POA: Diagnosis not present

## 2021-05-31 DIAGNOSIS — M20002 Unspecified deformity of left finger(s): Secondary | ICD-10-CM | POA: Diagnosis not present

## 2021-05-31 DIAGNOSIS — M6281 Muscle weakness (generalized): Secondary | ICD-10-CM | POA: Diagnosis not present

## 2021-05-31 DIAGNOSIS — R2689 Other abnormalities of gait and mobility: Secondary | ICD-10-CM | POA: Diagnosis not present

## 2021-05-31 DIAGNOSIS — M20001 Unspecified deformity of right finger(s): Secondary | ICD-10-CM | POA: Diagnosis not present

## 2021-06-01 ENCOUNTER — Other Ambulatory Visit: Payer: Self-pay

## 2021-06-01 ENCOUNTER — Telehealth: Payer: Medicare Other

## 2021-06-01 DIAGNOSIS — M6281 Muscle weakness (generalized): Secondary | ICD-10-CM | POA: Diagnosis not present

## 2021-06-01 DIAGNOSIS — M069 Rheumatoid arthritis, unspecified: Secondary | ICD-10-CM | POA: Diagnosis not present

## 2021-06-01 DIAGNOSIS — M20002 Unspecified deformity of left finger(s): Secondary | ICD-10-CM | POA: Diagnosis not present

## 2021-06-01 DIAGNOSIS — M20001 Unspecified deformity of right finger(s): Secondary | ICD-10-CM | POA: Diagnosis not present

## 2021-06-01 DIAGNOSIS — R2689 Other abnormalities of gait and mobility: Secondary | ICD-10-CM | POA: Diagnosis not present

## 2021-06-02 ENCOUNTER — Telehealth: Payer: Self-pay | Admitting: *Deleted

## 2021-06-02 DIAGNOSIS — M20002 Unspecified deformity of left finger(s): Secondary | ICD-10-CM | POA: Diagnosis not present

## 2021-06-02 DIAGNOSIS — M6281 Muscle weakness (generalized): Secondary | ICD-10-CM | POA: Diagnosis not present

## 2021-06-02 DIAGNOSIS — M20001 Unspecified deformity of right finger(s): Secondary | ICD-10-CM | POA: Diagnosis not present

## 2021-06-02 DIAGNOSIS — R2689 Other abnormalities of gait and mobility: Secondary | ICD-10-CM | POA: Diagnosis not present

## 2021-06-02 DIAGNOSIS — M069 Rheumatoid arthritis, unspecified: Secondary | ICD-10-CM | POA: Diagnosis not present

## 2021-06-02 NOTE — Telephone Encounter (Signed)
  Care Management   Follow Up Note   06/02/2021 Name: Diana Roberson MRN: [id number] DOB: 17-Jun-1946   Referred by: Karie Schwalbe, MD Reason for referral : No chief complaint on file.   Third unsuccessful telephone outreach was attempted today. The patient was referred to the case management team for assistance with care management and care coordination. The patient's primary care provider has been notified of our unsuccessful attempts to make or maintain contact with the patient. The care management team is pleased to engage with this patient at any time in the future should he/she be interested in assistance from the care management team.   Follow Up Plan: No further follow up required:    Reece Levy MSW, LCSW Licensed Clinical Social Worker [facility] [phone]

## 2021-06-03 DIAGNOSIS — M6281 Muscle weakness (generalized): Secondary | ICD-10-CM | POA: Diagnosis not present

## 2021-06-03 DIAGNOSIS — M20002 Unspecified deformity of left finger(s): Secondary | ICD-10-CM | POA: Diagnosis not present

## 2021-06-03 DIAGNOSIS — M20001 Unspecified deformity of right finger(s): Secondary | ICD-10-CM | POA: Diagnosis not present

## 2021-06-06 DIAGNOSIS — M6281 Muscle weakness (generalized): Secondary | ICD-10-CM | POA: Diagnosis not present

## 2021-06-06 DIAGNOSIS — M20001 Unspecified deformity of right finger(s): Secondary | ICD-10-CM | POA: Diagnosis not present

## 2021-06-06 DIAGNOSIS — M069 Rheumatoid arthritis, unspecified: Secondary | ICD-10-CM | POA: Diagnosis not present

## 2021-06-06 DIAGNOSIS — M20002 Unspecified deformity of left finger(s): Secondary | ICD-10-CM | POA: Diagnosis not present

## 2021-06-06 DIAGNOSIS — R2689 Other abnormalities of gait and mobility: Secondary | ICD-10-CM | POA: Diagnosis not present

## 2021-06-07 DIAGNOSIS — M05741 Rheumatoid arthritis with rheumatoid factor of right hand without organ or systems involvement: Secondary | ICD-10-CM | POA: Diagnosis not present

## 2021-06-07 DIAGNOSIS — R519 Headache, unspecified: Secondary | ICD-10-CM | POA: Diagnosis not present

## 2021-06-07 DIAGNOSIS — I609 Nontraumatic subarachnoid hemorrhage, unspecified: Secondary | ICD-10-CM | POA: Diagnosis not present

## 2021-06-07 DIAGNOSIS — R270 Ataxia, unspecified: Secondary | ICD-10-CM | POA: Diagnosis not present

## 2021-06-07 DIAGNOSIS — M05742 Rheumatoid arthritis with rheumatoid factor of left hand without organ or systems involvement: Secondary | ICD-10-CM | POA: Diagnosis not present

## 2021-06-07 DIAGNOSIS — E119 Type 2 diabetes mellitus without complications: Secondary | ICD-10-CM | POA: Diagnosis not present

## 2021-06-07 DIAGNOSIS — S060X0D Concussion without loss of consciousness, subsequent encounter: Secondary | ICD-10-CM | POA: Diagnosis not present

## 2021-06-07 DIAGNOSIS — E785 Hyperlipidemia, unspecified: Secondary | ICD-10-CM | POA: Diagnosis not present

## 2021-06-07 DIAGNOSIS — K219 Gastro-esophageal reflux disease without esophagitis: Secondary | ICD-10-CM | POA: Diagnosis not present

## 2021-06-07 DIAGNOSIS — I10 Essential (primary) hypertension: Secondary | ICD-10-CM | POA: Diagnosis not present

## 2021-06-07 DIAGNOSIS — S0003XD Contusion of scalp, subsequent encounter: Secondary | ICD-10-CM | POA: Diagnosis not present

## 2021-06-08 DIAGNOSIS — M069 Rheumatoid arthritis, unspecified: Secondary | ICD-10-CM | POA: Diagnosis not present

## 2021-06-08 DIAGNOSIS — M20001 Unspecified deformity of right finger(s): Secondary | ICD-10-CM | POA: Diagnosis not present

## 2021-06-08 DIAGNOSIS — R2689 Other abnormalities of gait and mobility: Secondary | ICD-10-CM | POA: Diagnosis not present

## 2021-06-08 DIAGNOSIS — M20002 Unspecified deformity of left finger(s): Secondary | ICD-10-CM | POA: Diagnosis not present

## 2021-06-08 DIAGNOSIS — M6281 Muscle weakness (generalized): Secondary | ICD-10-CM | POA: Diagnosis not present

## 2021-06-09 DIAGNOSIS — S0990XS Unspecified injury of head, sequela: Secondary | ICD-10-CM | POA: Diagnosis not present

## 2021-06-09 DIAGNOSIS — G44309 Post-traumatic headache, unspecified, not intractable: Secondary | ICD-10-CM | POA: Diagnosis not present

## 2021-06-09 DIAGNOSIS — R2681 Unsteadiness on feet: Secondary | ICD-10-CM | POA: Diagnosis not present

## 2021-06-09 DIAGNOSIS — S060X0A Concussion without loss of consciousness, initial encounter: Secondary | ICD-10-CM | POA: Diagnosis not present

## 2021-06-10 DIAGNOSIS — M20001 Unspecified deformity of right finger(s): Secondary | ICD-10-CM | POA: Diagnosis not present

## 2021-06-10 DIAGNOSIS — M6281 Muscle weakness (generalized): Secondary | ICD-10-CM | POA: Diagnosis not present

## 2021-06-10 DIAGNOSIS — M20002 Unspecified deformity of left finger(s): Secondary | ICD-10-CM | POA: Diagnosis not present

## 2021-06-13 DIAGNOSIS — R2689 Other abnormalities of gait and mobility: Secondary | ICD-10-CM | POA: Diagnosis not present

## 2021-06-13 DIAGNOSIS — M6281 Muscle weakness (generalized): Secondary | ICD-10-CM | POA: Diagnosis not present

## 2021-06-13 DIAGNOSIS — M069 Rheumatoid arthritis, unspecified: Secondary | ICD-10-CM | POA: Diagnosis not present

## 2021-06-14 DIAGNOSIS — M069 Rheumatoid arthritis, unspecified: Secondary | ICD-10-CM | POA: Diagnosis not present

## 2021-06-14 DIAGNOSIS — R2689 Other abnormalities of gait and mobility: Secondary | ICD-10-CM | POA: Diagnosis not present

## 2021-06-14 DIAGNOSIS — M6281 Muscle weakness (generalized): Secondary | ICD-10-CM | POA: Diagnosis not present

## 2021-06-15 DIAGNOSIS — M20001 Unspecified deformity of right finger(s): Secondary | ICD-10-CM | POA: Diagnosis not present

## 2021-06-15 DIAGNOSIS — M6281 Muscle weakness (generalized): Secondary | ICD-10-CM | POA: Diagnosis not present

## 2021-06-15 DIAGNOSIS — M20002 Unspecified deformity of left finger(s): Secondary | ICD-10-CM | POA: Diagnosis not present

## 2021-06-16 DIAGNOSIS — R2689 Other abnormalities of gait and mobility: Secondary | ICD-10-CM | POA: Diagnosis not present

## 2021-06-16 DIAGNOSIS — M20001 Unspecified deformity of right finger(s): Secondary | ICD-10-CM | POA: Diagnosis not present

## 2021-06-16 DIAGNOSIS — M6281 Muscle weakness (generalized): Secondary | ICD-10-CM | POA: Diagnosis not present

## 2021-06-16 DIAGNOSIS — M20002 Unspecified deformity of left finger(s): Secondary | ICD-10-CM | POA: Diagnosis not present

## 2021-06-16 DIAGNOSIS — M069 Rheumatoid arthritis, unspecified: Secondary | ICD-10-CM | POA: Diagnosis not present

## 2021-06-17 DIAGNOSIS — M6281 Muscle weakness (generalized): Secondary | ICD-10-CM | POA: Diagnosis not present

## 2021-06-17 DIAGNOSIS — M20001 Unspecified deformity of right finger(s): Secondary | ICD-10-CM | POA: Diagnosis not present

## 2021-06-17 DIAGNOSIS — M20002 Unspecified deformity of left finger(s): Secondary | ICD-10-CM | POA: Diagnosis not present

## 2021-06-20 DIAGNOSIS — M069 Rheumatoid arthritis, unspecified: Secondary | ICD-10-CM | POA: Diagnosis not present

## 2021-06-20 DIAGNOSIS — R2689 Other abnormalities of gait and mobility: Secondary | ICD-10-CM | POA: Diagnosis not present

## 2021-06-20 DIAGNOSIS — M6281 Muscle weakness (generalized): Secondary | ICD-10-CM | POA: Diagnosis not present

## 2021-06-21 DIAGNOSIS — M20001 Unspecified deformity of right finger(s): Secondary | ICD-10-CM | POA: Diagnosis not present

## 2021-06-21 DIAGNOSIS — M6281 Muscle weakness (generalized): Secondary | ICD-10-CM | POA: Diagnosis not present

## 2021-06-21 DIAGNOSIS — R2689 Other abnormalities of gait and mobility: Secondary | ICD-10-CM | POA: Diagnosis not present

## 2021-06-21 DIAGNOSIS — M20002 Unspecified deformity of left finger(s): Secondary | ICD-10-CM | POA: Diagnosis not present

## 2021-06-21 DIAGNOSIS — M069 Rheumatoid arthritis, unspecified: Secondary | ICD-10-CM | POA: Diagnosis not present

## 2021-06-22 DIAGNOSIS — M6281 Muscle weakness (generalized): Secondary | ICD-10-CM | POA: Diagnosis not present

## 2021-06-22 DIAGNOSIS — G3184 Mild cognitive impairment, so stated: Secondary | ICD-10-CM | POA: Diagnosis not present

## 2021-06-22 DIAGNOSIS — M069 Rheumatoid arthritis, unspecified: Secondary | ICD-10-CM | POA: Diagnosis not present

## 2021-06-22 DIAGNOSIS — R2689 Other abnormalities of gait and mobility: Secondary | ICD-10-CM | POA: Diagnosis not present

## 2021-06-23 DIAGNOSIS — M20002 Unspecified deformity of left finger(s): Secondary | ICD-10-CM | POA: Diagnosis not present

## 2021-06-23 DIAGNOSIS — M069 Rheumatoid arthritis, unspecified: Secondary | ICD-10-CM | POA: Diagnosis not present

## 2021-06-23 DIAGNOSIS — M20001 Unspecified deformity of right finger(s): Secondary | ICD-10-CM | POA: Diagnosis not present

## 2021-06-23 DIAGNOSIS — R2689 Other abnormalities of gait and mobility: Secondary | ICD-10-CM | POA: Diagnosis not present

## 2021-06-23 DIAGNOSIS — M6281 Muscle weakness (generalized): Secondary | ICD-10-CM | POA: Diagnosis not present

## 2021-06-24 DIAGNOSIS — M20001 Unspecified deformity of right finger(s): Secondary | ICD-10-CM | POA: Diagnosis not present

## 2021-06-24 DIAGNOSIS — M6281 Muscle weakness (generalized): Secondary | ICD-10-CM | POA: Diagnosis not present

## 2021-06-24 DIAGNOSIS — M20002 Unspecified deformity of left finger(s): Secondary | ICD-10-CM | POA: Diagnosis not present

## 2021-06-27 DIAGNOSIS — M6281 Muscle weakness (generalized): Secondary | ICD-10-CM | POA: Diagnosis not present

## 2021-06-27 DIAGNOSIS — M069 Rheumatoid arthritis, unspecified: Secondary | ICD-10-CM | POA: Diagnosis not present

## 2021-06-27 DIAGNOSIS — R2689 Other abnormalities of gait and mobility: Secondary | ICD-10-CM | POA: Diagnosis not present

## 2021-06-28 DIAGNOSIS — M20001 Unspecified deformity of right finger(s): Secondary | ICD-10-CM | POA: Diagnosis not present

## 2021-06-28 DIAGNOSIS — M20002 Unspecified deformity of left finger(s): Secondary | ICD-10-CM | POA: Diagnosis not present

## 2021-06-28 DIAGNOSIS — M6281 Muscle weakness (generalized): Secondary | ICD-10-CM | POA: Diagnosis not present

## 2021-06-29 DIAGNOSIS — M6281 Muscle weakness (generalized): Secondary | ICD-10-CM | POA: Diagnosis not present

## 2021-06-29 DIAGNOSIS — M069 Rheumatoid arthritis, unspecified: Secondary | ICD-10-CM | POA: Diagnosis not present

## 2021-06-29 DIAGNOSIS — M20001 Unspecified deformity of right finger(s): Secondary | ICD-10-CM | POA: Diagnosis not present

## 2021-06-29 DIAGNOSIS — M20002 Unspecified deformity of left finger(s): Secondary | ICD-10-CM | POA: Diagnosis not present

## 2021-06-29 DIAGNOSIS — R2689 Other abnormalities of gait and mobility: Secondary | ICD-10-CM | POA: Diagnosis not present

## 2021-06-30 DIAGNOSIS — R42 Dizziness and giddiness: Secondary | ICD-10-CM | POA: Diagnosis not present

## 2021-06-30 DIAGNOSIS — L299 Pruritus, unspecified: Secondary | ICD-10-CM | POA: Diagnosis not present

## 2021-06-30 DIAGNOSIS — M20001 Unspecified deformity of right finger(s): Secondary | ICD-10-CM | POA: Diagnosis not present

## 2021-06-30 DIAGNOSIS — M20002 Unspecified deformity of left finger(s): Secondary | ICD-10-CM | POA: Diagnosis not present

## 2021-06-30 DIAGNOSIS — I10 Essential (primary) hypertension: Secondary | ICD-10-CM | POA: Diagnosis not present

## 2021-06-30 DIAGNOSIS — M6281 Muscle weakness (generalized): Secondary | ICD-10-CM | POA: Diagnosis not present

## 2021-07-01 DIAGNOSIS — G3184 Mild cognitive impairment, so stated: Secondary | ICD-10-CM | POA: Diagnosis not present

## 2021-07-02 DIAGNOSIS — M6281 Muscle weakness (generalized): Secondary | ICD-10-CM | POA: Diagnosis not present

## 2021-07-02 DIAGNOSIS — M069 Rheumatoid arthritis, unspecified: Secondary | ICD-10-CM | POA: Diagnosis not present

## 2021-07-02 DIAGNOSIS — R2689 Other abnormalities of gait and mobility: Secondary | ICD-10-CM | POA: Diagnosis not present

## 2021-07-05 DIAGNOSIS — M069 Rheumatoid arthritis, unspecified: Secondary | ICD-10-CM | POA: Diagnosis not present

## 2021-07-05 DIAGNOSIS — R2689 Other abnormalities of gait and mobility: Secondary | ICD-10-CM | POA: Diagnosis not present

## 2021-07-05 DIAGNOSIS — M20001 Unspecified deformity of right finger(s): Secondary | ICD-10-CM | POA: Diagnosis not present

## 2021-07-05 DIAGNOSIS — L299 Pruritus, unspecified: Secondary | ICD-10-CM | POA: Diagnosis not present

## 2021-07-05 DIAGNOSIS — R2681 Unsteadiness on feet: Secondary | ICD-10-CM | POA: Diagnosis not present

## 2021-07-05 DIAGNOSIS — M20002 Unspecified deformity of left finger(s): Secondary | ICD-10-CM | POA: Diagnosis not present

## 2021-07-05 DIAGNOSIS — E119 Type 2 diabetes mellitus without complications: Secondary | ICD-10-CM | POA: Diagnosis not present

## 2021-07-05 DIAGNOSIS — I10 Essential (primary) hypertension: Secondary | ICD-10-CM | POA: Diagnosis not present

## 2021-07-05 DIAGNOSIS — F5109 Other insomnia not due to a substance or known physiological condition: Secondary | ICD-10-CM | POA: Diagnosis not present

## 2021-07-05 DIAGNOSIS — M6281 Muscle weakness (generalized): Secondary | ICD-10-CM | POA: Diagnosis not present

## 2021-07-06 DIAGNOSIS — G20 Parkinson's disease: Secondary | ICD-10-CM | POA: Diagnosis not present

## 2021-07-06 DIAGNOSIS — G479 Sleep disorder, unspecified: Secondary | ICD-10-CM | POA: Diagnosis not present

## 2021-07-06 DIAGNOSIS — M069 Rheumatoid arthritis, unspecified: Secondary | ICD-10-CM | POA: Diagnosis not present

## 2021-07-06 DIAGNOSIS — G3184 Mild cognitive impairment, so stated: Secondary | ICD-10-CM | POA: Diagnosis not present

## 2021-07-06 DIAGNOSIS — Z79899 Other long term (current) drug therapy: Secondary | ICD-10-CM | POA: Diagnosis not present

## 2021-07-06 DIAGNOSIS — M6281 Muscle weakness (generalized): Secondary | ICD-10-CM | POA: Diagnosis not present

## 2021-07-06 DIAGNOSIS — R0683 Snoring: Secondary | ICD-10-CM | POA: Diagnosis not present

## 2021-07-06 DIAGNOSIS — R2689 Other abnormalities of gait and mobility: Secondary | ICD-10-CM | POA: Diagnosis not present

## 2021-07-07 DIAGNOSIS — M20001 Unspecified deformity of right finger(s): Secondary | ICD-10-CM | POA: Diagnosis not present

## 2021-07-07 DIAGNOSIS — M6281 Muscle weakness (generalized): Secondary | ICD-10-CM | POA: Diagnosis not present

## 2021-07-07 DIAGNOSIS — M20002 Unspecified deformity of left finger(s): Secondary | ICD-10-CM | POA: Diagnosis not present

## 2021-07-07 DIAGNOSIS — M069 Rheumatoid arthritis, unspecified: Secondary | ICD-10-CM | POA: Diagnosis not present

## 2021-07-07 DIAGNOSIS — R2689 Other abnormalities of gait and mobility: Secondary | ICD-10-CM | POA: Diagnosis not present

## 2021-07-08 DIAGNOSIS — M6281 Muscle weakness (generalized): Secondary | ICD-10-CM | POA: Diagnosis not present

## 2021-07-08 DIAGNOSIS — M20001 Unspecified deformity of right finger(s): Secondary | ICD-10-CM | POA: Diagnosis not present

## 2021-07-08 DIAGNOSIS — G3184 Mild cognitive impairment, so stated: Secondary | ICD-10-CM | POA: Diagnosis not present

## 2021-07-08 DIAGNOSIS — M20002 Unspecified deformity of left finger(s): Secondary | ICD-10-CM | POA: Diagnosis not present

## 2021-07-11 DIAGNOSIS — M069 Rheumatoid arthritis, unspecified: Secondary | ICD-10-CM | POA: Diagnosis not present

## 2021-07-11 DIAGNOSIS — M20001 Unspecified deformity of right finger(s): Secondary | ICD-10-CM | POA: Diagnosis not present

## 2021-07-11 DIAGNOSIS — R2689 Other abnormalities of gait and mobility: Secondary | ICD-10-CM | POA: Diagnosis not present

## 2021-07-11 DIAGNOSIS — M20002 Unspecified deformity of left finger(s): Secondary | ICD-10-CM | POA: Diagnosis not present

## 2021-07-11 DIAGNOSIS — M6281 Muscle weakness (generalized): Secondary | ICD-10-CM | POA: Diagnosis not present

## 2021-07-12 DIAGNOSIS — R270 Ataxia, unspecified: Secondary | ICD-10-CM | POA: Diagnosis not present

## 2021-07-12 DIAGNOSIS — M6281 Muscle weakness (generalized): Secondary | ICD-10-CM | POA: Diagnosis not present

## 2021-07-12 DIAGNOSIS — E119 Type 2 diabetes mellitus without complications: Secondary | ICD-10-CM | POA: Diagnosis not present

## 2021-07-12 DIAGNOSIS — I10 Essential (primary) hypertension: Secondary | ICD-10-CM | POA: Diagnosis not present

## 2021-07-12 DIAGNOSIS — M20001 Unspecified deformity of right finger(s): Secondary | ICD-10-CM | POA: Diagnosis not present

## 2021-07-12 DIAGNOSIS — R2689 Other abnormalities of gait and mobility: Secondary | ICD-10-CM | POA: Diagnosis not present

## 2021-07-12 DIAGNOSIS — E785 Hyperlipidemia, unspecified: Secondary | ICD-10-CM | POA: Diagnosis not present

## 2021-07-12 DIAGNOSIS — E559 Vitamin D deficiency, unspecified: Secondary | ICD-10-CM | POA: Diagnosis not present

## 2021-07-12 DIAGNOSIS — M20002 Unspecified deformity of left finger(s): Secondary | ICD-10-CM | POA: Diagnosis not present

## 2021-07-12 DIAGNOSIS — M069 Rheumatoid arthritis, unspecified: Secondary | ICD-10-CM | POA: Diagnosis not present

## 2021-07-13 DIAGNOSIS — M20002 Unspecified deformity of left finger(s): Secondary | ICD-10-CM | POA: Diagnosis not present

## 2021-07-13 DIAGNOSIS — M6281 Muscle weakness (generalized): Secondary | ICD-10-CM | POA: Diagnosis not present

## 2021-07-13 DIAGNOSIS — M20001 Unspecified deformity of right finger(s): Secondary | ICD-10-CM | POA: Diagnosis not present

## 2021-07-14 DIAGNOSIS — M20002 Unspecified deformity of left finger(s): Secondary | ICD-10-CM | POA: Diagnosis not present

## 2021-07-14 DIAGNOSIS — M6281 Muscle weakness (generalized): Secondary | ICD-10-CM | POA: Diagnosis not present

## 2021-07-14 DIAGNOSIS — M20001 Unspecified deformity of right finger(s): Secondary | ICD-10-CM | POA: Diagnosis not present

## 2021-07-15 DIAGNOSIS — M6281 Muscle weakness (generalized): Secondary | ICD-10-CM | POA: Diagnosis not present

## 2021-07-15 DIAGNOSIS — R2689 Other abnormalities of gait and mobility: Secondary | ICD-10-CM | POA: Diagnosis not present

## 2021-07-15 DIAGNOSIS — M20001 Unspecified deformity of right finger(s): Secondary | ICD-10-CM | POA: Diagnosis not present

## 2021-07-15 DIAGNOSIS — M069 Rheumatoid arthritis, unspecified: Secondary | ICD-10-CM | POA: Diagnosis not present

## 2021-07-15 DIAGNOSIS — M20002 Unspecified deformity of left finger(s): Secondary | ICD-10-CM | POA: Diagnosis not present

## 2021-07-18 DIAGNOSIS — M069 Rheumatoid arthritis, unspecified: Secondary | ICD-10-CM | POA: Diagnosis not present

## 2021-07-18 DIAGNOSIS — R2689 Other abnormalities of gait and mobility: Secondary | ICD-10-CM | POA: Diagnosis not present

## 2021-07-18 DIAGNOSIS — M6281 Muscle weakness (generalized): Secondary | ICD-10-CM | POA: Diagnosis not present

## 2021-07-19 DIAGNOSIS — M6281 Muscle weakness (generalized): Secondary | ICD-10-CM | POA: Diagnosis not present

## 2021-07-19 DIAGNOSIS — M20001 Unspecified deformity of right finger(s): Secondary | ICD-10-CM | POA: Diagnosis not present

## 2021-07-19 DIAGNOSIS — R2689 Other abnormalities of gait and mobility: Secondary | ICD-10-CM | POA: Diagnosis not present

## 2021-07-19 DIAGNOSIS — M20002 Unspecified deformity of left finger(s): Secondary | ICD-10-CM | POA: Diagnosis not present

## 2021-07-19 DIAGNOSIS — M069 Rheumatoid arthritis, unspecified: Secondary | ICD-10-CM | POA: Diagnosis not present

## 2021-07-19 DIAGNOSIS — F5101 Primary insomnia: Secondary | ICD-10-CM | POA: Diagnosis not present

## 2021-07-19 DIAGNOSIS — R2681 Unsteadiness on feet: Secondary | ICD-10-CM | POA: Diagnosis not present

## 2021-07-20 DIAGNOSIS — M6281 Muscle weakness (generalized): Secondary | ICD-10-CM | POA: Diagnosis not present

## 2021-07-20 DIAGNOSIS — M20002 Unspecified deformity of left finger(s): Secondary | ICD-10-CM | POA: Diagnosis not present

## 2021-07-20 DIAGNOSIS — G3184 Mild cognitive impairment, so stated: Secondary | ICD-10-CM | POA: Diagnosis not present

## 2021-07-20 DIAGNOSIS — M20001 Unspecified deformity of right finger(s): Secondary | ICD-10-CM | POA: Diagnosis not present

## 2021-07-21 DIAGNOSIS — R2689 Other abnormalities of gait and mobility: Secondary | ICD-10-CM | POA: Diagnosis not present

## 2021-07-21 DIAGNOSIS — M069 Rheumatoid arthritis, unspecified: Secondary | ICD-10-CM | POA: Diagnosis not present

## 2021-07-21 DIAGNOSIS — R5381 Other malaise: Secondary | ICD-10-CM | POA: Diagnosis not present

## 2021-07-21 DIAGNOSIS — M6281 Muscle weakness (generalized): Secondary | ICD-10-CM | POA: Diagnosis not present

## 2021-07-21 DIAGNOSIS — I10 Essential (primary) hypertension: Secondary | ICD-10-CM | POA: Diagnosis not present

## 2021-07-21 DIAGNOSIS — L299 Pruritus, unspecified: Secondary | ICD-10-CM | POA: Diagnosis not present

## 2021-07-22 DIAGNOSIS — M20002 Unspecified deformity of left finger(s): Secondary | ICD-10-CM | POA: Diagnosis not present

## 2021-07-22 DIAGNOSIS — M6281 Muscle weakness (generalized): Secondary | ICD-10-CM | POA: Diagnosis not present

## 2021-07-22 DIAGNOSIS — M20001 Unspecified deformity of right finger(s): Secondary | ICD-10-CM | POA: Diagnosis not present

## 2021-07-25 DIAGNOSIS — M20001 Unspecified deformity of right finger(s): Secondary | ICD-10-CM | POA: Diagnosis not present

## 2021-07-25 DIAGNOSIS — M20002 Unspecified deformity of left finger(s): Secondary | ICD-10-CM | POA: Diagnosis not present

## 2021-07-25 DIAGNOSIS — M6281 Muscle weakness (generalized): Secondary | ICD-10-CM | POA: Diagnosis not present

## 2021-07-26 DIAGNOSIS — M20002 Unspecified deformity of left finger(s): Secondary | ICD-10-CM | POA: Diagnosis not present

## 2021-07-26 DIAGNOSIS — M20001 Unspecified deformity of right finger(s): Secondary | ICD-10-CM | POA: Diagnosis not present

## 2021-07-26 DIAGNOSIS — L299 Pruritus, unspecified: Secondary | ICD-10-CM | POA: Diagnosis not present

## 2021-07-26 DIAGNOSIS — F5109 Other insomnia not due to a substance or known physiological condition: Secondary | ICD-10-CM | POA: Diagnosis not present

## 2021-07-26 DIAGNOSIS — I10 Essential (primary) hypertension: Secondary | ICD-10-CM | POA: Diagnosis not present

## 2021-07-26 DIAGNOSIS — M6281 Muscle weakness (generalized): Secondary | ICD-10-CM | POA: Diagnosis not present

## 2021-07-26 DIAGNOSIS — R2681 Unsteadiness on feet: Secondary | ICD-10-CM | POA: Diagnosis not present

## 2021-07-27 DIAGNOSIS — M20001 Unspecified deformity of right finger(s): Secondary | ICD-10-CM | POA: Diagnosis not present

## 2021-07-27 DIAGNOSIS — M6281 Muscle weakness (generalized): Secondary | ICD-10-CM | POA: Diagnosis not present

## 2021-07-27 DIAGNOSIS — M20002 Unspecified deformity of left finger(s): Secondary | ICD-10-CM | POA: Diagnosis not present

## 2021-07-27 DIAGNOSIS — R2689 Other abnormalities of gait and mobility: Secondary | ICD-10-CM | POA: Diagnosis not present

## 2021-07-27 DIAGNOSIS — M069 Rheumatoid arthritis, unspecified: Secondary | ICD-10-CM | POA: Diagnosis not present

## 2021-07-28 DIAGNOSIS — M069 Rheumatoid arthritis, unspecified: Secondary | ICD-10-CM | POA: Diagnosis not present

## 2021-07-28 DIAGNOSIS — R2689 Other abnormalities of gait and mobility: Secondary | ICD-10-CM | POA: Diagnosis not present

## 2021-07-28 DIAGNOSIS — M6281 Muscle weakness (generalized): Secondary | ICD-10-CM | POA: Diagnosis not present

## 2021-07-29 DIAGNOSIS — M6281 Muscle weakness (generalized): Secondary | ICD-10-CM | POA: Diagnosis not present

## 2021-07-29 DIAGNOSIS — M069 Rheumatoid arthritis, unspecified: Secondary | ICD-10-CM | POA: Diagnosis not present

## 2021-07-29 DIAGNOSIS — G3184 Mild cognitive impairment, so stated: Secondary | ICD-10-CM | POA: Diagnosis not present

## 2021-07-29 DIAGNOSIS — R2689 Other abnormalities of gait and mobility: Secondary | ICD-10-CM | POA: Diagnosis not present

## 2021-08-01 DIAGNOSIS — M6281 Muscle weakness (generalized): Secondary | ICD-10-CM | POA: Diagnosis not present

## 2021-08-01 DIAGNOSIS — M20001 Unspecified deformity of right finger(s): Secondary | ICD-10-CM | POA: Diagnosis not present

## 2021-08-01 DIAGNOSIS — M069 Rheumatoid arthritis, unspecified: Secondary | ICD-10-CM | POA: Diagnosis not present

## 2021-08-01 DIAGNOSIS — M20002 Unspecified deformity of left finger(s): Secondary | ICD-10-CM | POA: Diagnosis not present

## 2021-08-01 DIAGNOSIS — R2689 Other abnormalities of gait and mobility: Secondary | ICD-10-CM | POA: Diagnosis not present

## 2021-08-02 DIAGNOSIS — R2689 Other abnormalities of gait and mobility: Secondary | ICD-10-CM | POA: Diagnosis not present

## 2021-08-02 DIAGNOSIS — M6281 Muscle weakness (generalized): Secondary | ICD-10-CM | POA: Diagnosis not present

## 2021-08-02 DIAGNOSIS — M069 Rheumatoid arthritis, unspecified: Secondary | ICD-10-CM | POA: Diagnosis not present

## 2021-08-03 DIAGNOSIS — M069 Rheumatoid arthritis, unspecified: Secondary | ICD-10-CM | POA: Diagnosis not present

## 2021-08-03 DIAGNOSIS — R2689 Other abnormalities of gait and mobility: Secondary | ICD-10-CM | POA: Diagnosis not present

## 2021-08-03 DIAGNOSIS — M6281 Muscle weakness (generalized): Secondary | ICD-10-CM | POA: Diagnosis not present

## 2021-08-08 DIAGNOSIS — M20002 Unspecified deformity of left finger(s): Secondary | ICD-10-CM | POA: Diagnosis not present

## 2021-08-08 DIAGNOSIS — M6281 Muscle weakness (generalized): Secondary | ICD-10-CM | POA: Diagnosis not present

## 2021-08-08 DIAGNOSIS — M20001 Unspecified deformity of right finger(s): Secondary | ICD-10-CM | POA: Diagnosis not present

## 2021-08-08 DIAGNOSIS — G20 Parkinson's disease: Secondary | ICD-10-CM | POA: Diagnosis not present

## 2021-08-09 DIAGNOSIS — R2689 Other abnormalities of gait and mobility: Secondary | ICD-10-CM | POA: Diagnosis not present

## 2021-08-09 DIAGNOSIS — M069 Rheumatoid arthritis, unspecified: Secondary | ICD-10-CM | POA: Diagnosis not present

## 2021-08-09 DIAGNOSIS — M6281 Muscle weakness (generalized): Secondary | ICD-10-CM | POA: Diagnosis not present

## 2021-08-10 DIAGNOSIS — M069 Rheumatoid arthritis, unspecified: Secondary | ICD-10-CM | POA: Diagnosis not present

## 2021-08-10 DIAGNOSIS — R2689 Other abnormalities of gait and mobility: Secondary | ICD-10-CM | POA: Diagnosis not present

## 2021-08-10 DIAGNOSIS — G3184 Mild cognitive impairment, so stated: Secondary | ICD-10-CM | POA: Diagnosis not present

## 2021-08-10 DIAGNOSIS — M20001 Unspecified deformity of right finger(s): Secondary | ICD-10-CM | POA: Diagnosis not present

## 2021-08-10 DIAGNOSIS — M6281 Muscle weakness (generalized): Secondary | ICD-10-CM | POA: Diagnosis not present

## 2021-08-10 DIAGNOSIS — M20002 Unspecified deformity of left finger(s): Secondary | ICD-10-CM | POA: Diagnosis not present

## 2021-08-11 DIAGNOSIS — M6281 Muscle weakness (generalized): Secondary | ICD-10-CM | POA: Diagnosis not present

## 2021-08-11 DIAGNOSIS — R2689 Other abnormalities of gait and mobility: Secondary | ICD-10-CM | POA: Diagnosis not present

## 2021-08-11 DIAGNOSIS — M069 Rheumatoid arthritis, unspecified: Secondary | ICD-10-CM | POA: Diagnosis not present

## 2021-08-12 DIAGNOSIS — G3184 Mild cognitive impairment, so stated: Secondary | ICD-10-CM | POA: Diagnosis not present

## 2021-08-12 DIAGNOSIS — M6281 Muscle weakness (generalized): Secondary | ICD-10-CM | POA: Diagnosis not present

## 2021-08-12 DIAGNOSIS — R2689 Other abnormalities of gait and mobility: Secondary | ICD-10-CM | POA: Diagnosis not present

## 2021-08-12 DIAGNOSIS — M069 Rheumatoid arthritis, unspecified: Secondary | ICD-10-CM | POA: Diagnosis not present

## 2021-08-15 DIAGNOSIS — R2689 Other abnormalities of gait and mobility: Secondary | ICD-10-CM | POA: Diagnosis not present

## 2021-08-15 DIAGNOSIS — M069 Rheumatoid arthritis, unspecified: Secondary | ICD-10-CM | POA: Diagnosis not present

## 2021-08-15 DIAGNOSIS — M6281 Muscle weakness (generalized): Secondary | ICD-10-CM | POA: Diagnosis not present

## 2021-08-16 DIAGNOSIS — M20002 Unspecified deformity of left finger(s): Secondary | ICD-10-CM | POA: Diagnosis not present

## 2021-08-16 DIAGNOSIS — M6281 Muscle weakness (generalized): Secondary | ICD-10-CM | POA: Diagnosis not present

## 2021-08-16 DIAGNOSIS — M20001 Unspecified deformity of right finger(s): Secondary | ICD-10-CM | POA: Diagnosis not present

## 2021-08-17 DIAGNOSIS — R2689 Other abnormalities of gait and mobility: Secondary | ICD-10-CM | POA: Diagnosis not present

## 2021-08-17 DIAGNOSIS — M069 Rheumatoid arthritis, unspecified: Secondary | ICD-10-CM | POA: Diagnosis not present

## 2021-08-17 DIAGNOSIS — M6281 Muscle weakness (generalized): Secondary | ICD-10-CM | POA: Diagnosis not present

## 2021-08-17 DIAGNOSIS — M20001 Unspecified deformity of right finger(s): Secondary | ICD-10-CM | POA: Diagnosis not present

## 2021-08-17 DIAGNOSIS — G3184 Mild cognitive impairment, so stated: Secondary | ICD-10-CM | POA: Diagnosis not present

## 2021-08-17 DIAGNOSIS — M20002 Unspecified deformity of left finger(s): Secondary | ICD-10-CM | POA: Diagnosis not present

## 2021-08-18 DIAGNOSIS — M6281 Muscle weakness (generalized): Secondary | ICD-10-CM | POA: Diagnosis not present

## 2021-08-18 DIAGNOSIS — M20002 Unspecified deformity of left finger(s): Secondary | ICD-10-CM | POA: Diagnosis not present

## 2021-08-18 DIAGNOSIS — M20001 Unspecified deformity of right finger(s): Secondary | ICD-10-CM | POA: Diagnosis not present

## 2021-08-18 DIAGNOSIS — M069 Rheumatoid arthritis, unspecified: Secondary | ICD-10-CM | POA: Diagnosis not present

## 2021-08-18 DIAGNOSIS — R2689 Other abnormalities of gait and mobility: Secondary | ICD-10-CM | POA: Diagnosis not present

## 2021-08-19 DIAGNOSIS — M20001 Unspecified deformity of right finger(s): Secondary | ICD-10-CM | POA: Diagnosis not present

## 2021-08-19 DIAGNOSIS — G3184 Mild cognitive impairment, so stated: Secondary | ICD-10-CM | POA: Diagnosis not present

## 2021-08-19 DIAGNOSIS — M6281 Muscle weakness (generalized): Secondary | ICD-10-CM | POA: Diagnosis not present

## 2021-08-19 DIAGNOSIS — M20002 Unspecified deformity of left finger(s): Secondary | ICD-10-CM | POA: Diagnosis not present

## 2021-08-22 DIAGNOSIS — M20002 Unspecified deformity of left finger(s): Secondary | ICD-10-CM | POA: Diagnosis not present

## 2021-08-22 DIAGNOSIS — M20001 Unspecified deformity of right finger(s): Secondary | ICD-10-CM | POA: Diagnosis not present

## 2021-08-22 DIAGNOSIS — M6281 Muscle weakness (generalized): Secondary | ICD-10-CM | POA: Diagnosis not present

## 2021-08-24 DIAGNOSIS — M6281 Muscle weakness (generalized): Secondary | ICD-10-CM | POA: Diagnosis not present

## 2021-08-24 DIAGNOSIS — M20002 Unspecified deformity of left finger(s): Secondary | ICD-10-CM | POA: Diagnosis not present

## 2021-08-24 DIAGNOSIS — G3184 Mild cognitive impairment, so stated: Secondary | ICD-10-CM | POA: Diagnosis not present

## 2021-08-24 DIAGNOSIS — R2689 Other abnormalities of gait and mobility: Secondary | ICD-10-CM | POA: Diagnosis not present

## 2021-08-24 DIAGNOSIS — M069 Rheumatoid arthritis, unspecified: Secondary | ICD-10-CM | POA: Diagnosis not present

## 2021-08-24 DIAGNOSIS — M20001 Unspecified deformity of right finger(s): Secondary | ICD-10-CM | POA: Diagnosis not present

## 2021-08-25 ENCOUNTER — Ambulatory Visit: Payer: Medicare Other | Admitting: Internal Medicine

## 2021-08-25 DIAGNOSIS — M20002 Unspecified deformity of left finger(s): Secondary | ICD-10-CM | POA: Diagnosis not present

## 2021-08-25 DIAGNOSIS — M6281 Muscle weakness (generalized): Secondary | ICD-10-CM | POA: Diagnosis not present

## 2021-08-25 DIAGNOSIS — M20001 Unspecified deformity of right finger(s): Secondary | ICD-10-CM | POA: Diagnosis not present

## 2021-08-26 DIAGNOSIS — M6281 Muscle weakness (generalized): Secondary | ICD-10-CM | POA: Diagnosis not present

## 2021-08-26 DIAGNOSIS — G3184 Mild cognitive impairment, so stated: Secondary | ICD-10-CM | POA: Diagnosis not present

## 2021-08-26 DIAGNOSIS — R2689 Other abnormalities of gait and mobility: Secondary | ICD-10-CM | POA: Diagnosis not present

## 2021-08-26 DIAGNOSIS — M069 Rheumatoid arthritis, unspecified: Secondary | ICD-10-CM | POA: Diagnosis not present

## 2021-08-29 DIAGNOSIS — M6281 Muscle weakness (generalized): Secondary | ICD-10-CM | POA: Diagnosis not present

## 2021-08-29 DIAGNOSIS — G3184 Mild cognitive impairment, so stated: Secondary | ICD-10-CM | POA: Diagnosis not present

## 2021-08-29 DIAGNOSIS — M069 Rheumatoid arthritis, unspecified: Secondary | ICD-10-CM | POA: Diagnosis not present

## 2021-08-29 DIAGNOSIS — M20001 Unspecified deformity of right finger(s): Secondary | ICD-10-CM | POA: Diagnosis not present

## 2021-08-29 DIAGNOSIS — R2689 Other abnormalities of gait and mobility: Secondary | ICD-10-CM | POA: Diagnosis not present

## 2021-08-29 DIAGNOSIS — M20002 Unspecified deformity of left finger(s): Secondary | ICD-10-CM | POA: Diagnosis not present

## 2021-08-30 DIAGNOSIS — R2689 Other abnormalities of gait and mobility: Secondary | ICD-10-CM | POA: Diagnosis not present

## 2021-08-30 DIAGNOSIS — M20001 Unspecified deformity of right finger(s): Secondary | ICD-10-CM | POA: Diagnosis not present

## 2021-08-30 DIAGNOSIS — M069 Rheumatoid arthritis, unspecified: Secondary | ICD-10-CM | POA: Diagnosis not present

## 2021-08-30 DIAGNOSIS — M6281 Muscle weakness (generalized): Secondary | ICD-10-CM | POA: Diagnosis not present

## 2021-08-30 DIAGNOSIS — M20002 Unspecified deformity of left finger(s): Secondary | ICD-10-CM | POA: Diagnosis not present

## 2021-08-31 DIAGNOSIS — G3184 Mild cognitive impairment, so stated: Secondary | ICD-10-CM | POA: Diagnosis not present

## 2021-09-01 DIAGNOSIS — M20001 Unspecified deformity of right finger(s): Secondary | ICD-10-CM | POA: Diagnosis not present

## 2021-09-01 DIAGNOSIS — R2689 Other abnormalities of gait and mobility: Secondary | ICD-10-CM | POA: Diagnosis not present

## 2021-09-01 DIAGNOSIS — M20002 Unspecified deformity of left finger(s): Secondary | ICD-10-CM | POA: Diagnosis not present

## 2021-09-01 DIAGNOSIS — I10 Essential (primary) hypertension: Secondary | ICD-10-CM | POA: Diagnosis not present

## 2021-09-01 DIAGNOSIS — R609 Edema, unspecified: Secondary | ICD-10-CM | POA: Diagnosis not present

## 2021-09-01 DIAGNOSIS — M6281 Muscle weakness (generalized): Secondary | ICD-10-CM | POA: Diagnosis not present

## 2021-09-01 DIAGNOSIS — R54 Age-related physical debility: Secondary | ICD-10-CM | POA: Diagnosis not present

## 2021-09-01 DIAGNOSIS — M069 Rheumatoid arthritis, unspecified: Secondary | ICD-10-CM | POA: Diagnosis not present

## 2021-09-07 DIAGNOSIS — M069 Rheumatoid arthritis, unspecified: Secondary | ICD-10-CM | POA: Diagnosis not present

## 2021-09-07 DIAGNOSIS — R2689 Other abnormalities of gait and mobility: Secondary | ICD-10-CM | POA: Diagnosis not present

## 2021-09-07 DIAGNOSIS — M6281 Muscle weakness (generalized): Secondary | ICD-10-CM | POA: Diagnosis not present

## 2021-09-07 DIAGNOSIS — G3184 Mild cognitive impairment, so stated: Secondary | ICD-10-CM | POA: Diagnosis not present

## 2021-09-08 DIAGNOSIS — E119 Type 2 diabetes mellitus without complications: Secondary | ICD-10-CM | POA: Diagnosis not present

## 2021-09-08 DIAGNOSIS — M20001 Unspecified deformity of right finger(s): Secondary | ICD-10-CM | POA: Diagnosis not present

## 2021-09-08 DIAGNOSIS — I10 Essential (primary) hypertension: Secondary | ICD-10-CM | POA: Diagnosis not present

## 2021-09-08 DIAGNOSIS — M20002 Unspecified deformity of left finger(s): Secondary | ICD-10-CM | POA: Diagnosis not present

## 2021-09-08 DIAGNOSIS — R609 Edema, unspecified: Secondary | ICD-10-CM | POA: Diagnosis not present

## 2021-09-08 DIAGNOSIS — M6281 Muscle weakness (generalized): Secondary | ICD-10-CM | POA: Diagnosis not present

## 2021-09-08 DIAGNOSIS — R54 Age-related physical debility: Secondary | ICD-10-CM | POA: Diagnosis not present

## 2021-09-09 DIAGNOSIS — G3184 Mild cognitive impairment, so stated: Secondary | ICD-10-CM | POA: Diagnosis not present

## 2021-09-09 DIAGNOSIS — M6281 Muscle weakness (generalized): Secondary | ICD-10-CM | POA: Diagnosis not present

## 2021-09-09 DIAGNOSIS — R2689 Other abnormalities of gait and mobility: Secondary | ICD-10-CM | POA: Diagnosis not present

## 2021-09-09 DIAGNOSIS — M069 Rheumatoid arthritis, unspecified: Secondary | ICD-10-CM | POA: Diagnosis not present

## 2021-09-12 DIAGNOSIS — M069 Rheumatoid arthritis, unspecified: Secondary | ICD-10-CM | POA: Diagnosis not present

## 2021-09-12 DIAGNOSIS — R2689 Other abnormalities of gait and mobility: Secondary | ICD-10-CM | POA: Diagnosis not present

## 2021-09-12 DIAGNOSIS — M20002 Unspecified deformity of left finger(s): Secondary | ICD-10-CM | POA: Diagnosis not present

## 2021-09-12 DIAGNOSIS — M6281 Muscle weakness (generalized): Secondary | ICD-10-CM | POA: Diagnosis not present

## 2021-09-12 DIAGNOSIS — M20001 Unspecified deformity of right finger(s): Secondary | ICD-10-CM | POA: Diagnosis not present

## 2021-09-13 DIAGNOSIS — M20001 Unspecified deformity of right finger(s): Secondary | ICD-10-CM | POA: Diagnosis not present

## 2021-09-13 DIAGNOSIS — R2689 Other abnormalities of gait and mobility: Secondary | ICD-10-CM | POA: Diagnosis not present

## 2021-09-13 DIAGNOSIS — M6281 Muscle weakness (generalized): Secondary | ICD-10-CM | POA: Diagnosis not present

## 2021-09-13 DIAGNOSIS — M069 Rheumatoid arthritis, unspecified: Secondary | ICD-10-CM | POA: Diagnosis not present

## 2021-09-13 DIAGNOSIS — M20002 Unspecified deformity of left finger(s): Secondary | ICD-10-CM | POA: Diagnosis not present

## 2021-09-14 DIAGNOSIS — G3184 Mild cognitive impairment, so stated: Secondary | ICD-10-CM | POA: Diagnosis not present

## 2021-09-15 DIAGNOSIS — M20001 Unspecified deformity of right finger(s): Secondary | ICD-10-CM | POA: Diagnosis not present

## 2021-09-15 DIAGNOSIS — M20002 Unspecified deformity of left finger(s): Secondary | ICD-10-CM | POA: Diagnosis not present

## 2021-09-15 DIAGNOSIS — M6281 Muscle weakness (generalized): Secondary | ICD-10-CM | POA: Diagnosis not present

## 2021-09-16 DIAGNOSIS — R2689 Other abnormalities of gait and mobility: Secondary | ICD-10-CM | POA: Diagnosis not present

## 2021-09-16 DIAGNOSIS — M6281 Muscle weakness (generalized): Secondary | ICD-10-CM | POA: Diagnosis not present

## 2021-09-16 DIAGNOSIS — G3184 Mild cognitive impairment, so stated: Secondary | ICD-10-CM | POA: Diagnosis not present

## 2021-09-16 DIAGNOSIS — M069 Rheumatoid arthritis, unspecified: Secondary | ICD-10-CM | POA: Diagnosis not present

## 2021-09-20 DIAGNOSIS — I10 Essential (primary) hypertension: Secondary | ICD-10-CM | POA: Diagnosis not present

## 2021-09-20 DIAGNOSIS — M20001 Unspecified deformity of right finger(s): Secondary | ICD-10-CM | POA: Diagnosis not present

## 2021-09-20 DIAGNOSIS — M20002 Unspecified deformity of left finger(s): Secondary | ICD-10-CM | POA: Diagnosis not present

## 2021-09-20 DIAGNOSIS — G3184 Mild cognitive impairment, so stated: Secondary | ICD-10-CM | POA: Diagnosis not present

## 2021-09-20 DIAGNOSIS — F5103 Paradoxical insomnia: Secondary | ICD-10-CM | POA: Diagnosis not present

## 2021-09-20 DIAGNOSIS — R2689 Other abnormalities of gait and mobility: Secondary | ICD-10-CM | POA: Diagnosis not present

## 2021-09-20 DIAGNOSIS — E119 Type 2 diabetes mellitus without complications: Secondary | ICD-10-CM | POA: Diagnosis not present

## 2021-09-20 DIAGNOSIS — M6281 Muscle weakness (generalized): Secondary | ICD-10-CM | POA: Diagnosis not present

## 2021-09-20 DIAGNOSIS — M069 Rheumatoid arthritis, unspecified: Secondary | ICD-10-CM | POA: Diagnosis not present

## 2021-09-21 DIAGNOSIS — M20001 Unspecified deformity of right finger(s): Secondary | ICD-10-CM | POA: Diagnosis not present

## 2021-09-21 DIAGNOSIS — M069 Rheumatoid arthritis, unspecified: Secondary | ICD-10-CM | POA: Diagnosis not present

## 2021-09-21 DIAGNOSIS — M20002 Unspecified deformity of left finger(s): Secondary | ICD-10-CM | POA: Diagnosis not present

## 2021-09-21 DIAGNOSIS — R2689 Other abnormalities of gait and mobility: Secondary | ICD-10-CM | POA: Diagnosis not present

## 2021-09-21 DIAGNOSIS — M6281 Muscle weakness (generalized): Secondary | ICD-10-CM | POA: Diagnosis not present

## 2021-09-22 DIAGNOSIS — M20001 Unspecified deformity of right finger(s): Secondary | ICD-10-CM | POA: Diagnosis not present

## 2021-09-22 DIAGNOSIS — G3184 Mild cognitive impairment, so stated: Secondary | ICD-10-CM | POA: Diagnosis not present

## 2021-09-22 DIAGNOSIS — M069 Rheumatoid arthritis, unspecified: Secondary | ICD-10-CM | POA: Diagnosis not present

## 2021-09-22 DIAGNOSIS — M20002 Unspecified deformity of left finger(s): Secondary | ICD-10-CM | POA: Diagnosis not present

## 2021-09-22 DIAGNOSIS — R2689 Other abnormalities of gait and mobility: Secondary | ICD-10-CM | POA: Diagnosis not present

## 2021-09-22 DIAGNOSIS — M6281 Muscle weakness (generalized): Secondary | ICD-10-CM | POA: Diagnosis not present

## 2021-09-23 DIAGNOSIS — M20002 Unspecified deformity of left finger(s): Secondary | ICD-10-CM | POA: Diagnosis not present

## 2021-09-23 DIAGNOSIS — M6281 Muscle weakness (generalized): Secondary | ICD-10-CM | POA: Diagnosis not present

## 2021-09-23 DIAGNOSIS — M069 Rheumatoid arthritis, unspecified: Secondary | ICD-10-CM | POA: Diagnosis not present

## 2021-09-23 DIAGNOSIS — R2689 Other abnormalities of gait and mobility: Secondary | ICD-10-CM | POA: Diagnosis not present

## 2021-09-23 DIAGNOSIS — M20001 Unspecified deformity of right finger(s): Secondary | ICD-10-CM | POA: Diagnosis not present

## 2021-09-26 DIAGNOSIS — R2689 Other abnormalities of gait and mobility: Secondary | ICD-10-CM | POA: Diagnosis not present

## 2021-09-26 DIAGNOSIS — M6281 Muscle weakness (generalized): Secondary | ICD-10-CM | POA: Diagnosis not present

## 2021-09-26 DIAGNOSIS — M069 Rheumatoid arthritis, unspecified: Secondary | ICD-10-CM | POA: Diagnosis not present

## 2021-09-27 DIAGNOSIS — M20001 Unspecified deformity of right finger(s): Secondary | ICD-10-CM | POA: Diagnosis not present

## 2021-09-27 DIAGNOSIS — M6281 Muscle weakness (generalized): Secondary | ICD-10-CM | POA: Diagnosis not present

## 2021-09-27 DIAGNOSIS — M20002 Unspecified deformity of left finger(s): Secondary | ICD-10-CM | POA: Diagnosis not present

## 2021-09-28 DIAGNOSIS — M20001 Unspecified deformity of right finger(s): Secondary | ICD-10-CM | POA: Diagnosis not present

## 2021-09-28 DIAGNOSIS — M20002 Unspecified deformity of left finger(s): Secondary | ICD-10-CM | POA: Diagnosis not present

## 2021-09-28 DIAGNOSIS — R2689 Other abnormalities of gait and mobility: Secondary | ICD-10-CM | POA: Diagnosis not present

## 2021-09-28 DIAGNOSIS — M6281 Muscle weakness (generalized): Secondary | ICD-10-CM | POA: Diagnosis not present

## 2021-09-28 DIAGNOSIS — M069 Rheumatoid arthritis, unspecified: Secondary | ICD-10-CM | POA: Diagnosis not present

## 2021-09-29 DIAGNOSIS — G3184 Mild cognitive impairment, so stated: Secondary | ICD-10-CM | POA: Diagnosis not present

## 2021-09-29 DIAGNOSIS — R2689 Other abnormalities of gait and mobility: Secondary | ICD-10-CM | POA: Diagnosis not present

## 2021-09-29 DIAGNOSIS — M069 Rheumatoid arthritis, unspecified: Secondary | ICD-10-CM | POA: Diagnosis not present

## 2021-09-29 DIAGNOSIS — M6281 Muscle weakness (generalized): Secondary | ICD-10-CM | POA: Diagnosis not present

## 2021-09-29 DIAGNOSIS — M20002 Unspecified deformity of left finger(s): Secondary | ICD-10-CM | POA: Diagnosis not present

## 2021-09-29 DIAGNOSIS — M20001 Unspecified deformity of right finger(s): Secondary | ICD-10-CM | POA: Diagnosis not present

## 2021-10-03 DIAGNOSIS — M069 Rheumatoid arthritis, unspecified: Secondary | ICD-10-CM | POA: Diagnosis not present

## 2021-10-03 DIAGNOSIS — M6281 Muscle weakness (generalized): Secondary | ICD-10-CM | POA: Diagnosis not present

## 2021-10-03 DIAGNOSIS — R2689 Other abnormalities of gait and mobility: Secondary | ICD-10-CM | POA: Diagnosis not present

## 2021-10-04 DIAGNOSIS — G3184 Mild cognitive impairment, so stated: Secondary | ICD-10-CM | POA: Diagnosis not present

## 2021-10-05 DIAGNOSIS — M20001 Unspecified deformity of right finger(s): Secondary | ICD-10-CM | POA: Diagnosis not present

## 2021-10-05 DIAGNOSIS — R2689 Other abnormalities of gait and mobility: Secondary | ICD-10-CM | POA: Diagnosis not present

## 2021-10-05 DIAGNOSIS — M20002 Unspecified deformity of left finger(s): Secondary | ICD-10-CM | POA: Diagnosis not present

## 2021-10-05 DIAGNOSIS — M6281 Muscle weakness (generalized): Secondary | ICD-10-CM | POA: Diagnosis not present

## 2021-10-05 DIAGNOSIS — M069 Rheumatoid arthritis, unspecified: Secondary | ICD-10-CM | POA: Diagnosis not present

## 2021-10-06 DIAGNOSIS — M20001 Unspecified deformity of right finger(s): Secondary | ICD-10-CM | POA: Diagnosis not present

## 2021-10-06 DIAGNOSIS — G3184 Mild cognitive impairment, so stated: Secondary | ICD-10-CM | POA: Diagnosis not present

## 2021-10-06 DIAGNOSIS — M20002 Unspecified deformity of left finger(s): Secondary | ICD-10-CM | POA: Diagnosis not present

## 2021-10-06 DIAGNOSIS — R2689 Other abnormalities of gait and mobility: Secondary | ICD-10-CM | POA: Diagnosis not present

## 2021-10-06 DIAGNOSIS — M6281 Muscle weakness (generalized): Secondary | ICD-10-CM | POA: Diagnosis not present

## 2021-10-06 DIAGNOSIS — M069 Rheumatoid arthritis, unspecified: Secondary | ICD-10-CM | POA: Diagnosis not present

## 2021-10-06 DIAGNOSIS — R54 Age-related physical debility: Secondary | ICD-10-CM | POA: Diagnosis not present

## 2021-10-06 DIAGNOSIS — I10 Essential (primary) hypertension: Secondary | ICD-10-CM | POA: Diagnosis not present

## 2021-10-07 DIAGNOSIS — R2689 Other abnormalities of gait and mobility: Secondary | ICD-10-CM | POA: Diagnosis not present

## 2021-10-07 DIAGNOSIS — M6281 Muscle weakness (generalized): Secondary | ICD-10-CM | POA: Diagnosis not present

## 2021-10-07 DIAGNOSIS — M069 Rheumatoid arthritis, unspecified: Secondary | ICD-10-CM | POA: Diagnosis not present

## 2021-10-10 DIAGNOSIS — M6281 Muscle weakness (generalized): Secondary | ICD-10-CM | POA: Diagnosis not present

## 2021-10-10 DIAGNOSIS — R2689 Other abnormalities of gait and mobility: Secondary | ICD-10-CM | POA: Diagnosis not present

## 2021-10-10 DIAGNOSIS — M069 Rheumatoid arthritis, unspecified: Secondary | ICD-10-CM | POA: Diagnosis not present

## 2021-10-11 DIAGNOSIS — R2689 Other abnormalities of gait and mobility: Secondary | ICD-10-CM | POA: Diagnosis not present

## 2021-10-11 DIAGNOSIS — M6281 Muscle weakness (generalized): Secondary | ICD-10-CM | POA: Diagnosis not present

## 2021-10-11 DIAGNOSIS — G3184 Mild cognitive impairment, so stated: Secondary | ICD-10-CM | POA: Diagnosis not present

## 2021-10-11 DIAGNOSIS — M069 Rheumatoid arthritis, unspecified: Secondary | ICD-10-CM | POA: Diagnosis not present

## 2021-10-12 DIAGNOSIS — R2689 Other abnormalities of gait and mobility: Secondary | ICD-10-CM | POA: Diagnosis not present

## 2021-10-12 DIAGNOSIS — M069 Rheumatoid arthritis, unspecified: Secondary | ICD-10-CM | POA: Diagnosis not present

## 2021-10-12 DIAGNOSIS — M6281 Muscle weakness (generalized): Secondary | ICD-10-CM | POA: Diagnosis not present

## 2021-10-13 DIAGNOSIS — M6281 Muscle weakness (generalized): Secondary | ICD-10-CM | POA: Diagnosis not present

## 2021-10-14 DIAGNOSIS — R2689 Other abnormalities of gait and mobility: Secondary | ICD-10-CM | POA: Diagnosis not present

## 2021-10-14 DIAGNOSIS — M6281 Muscle weakness (generalized): Secondary | ICD-10-CM | POA: Diagnosis not present

## 2021-10-14 DIAGNOSIS — M069 Rheumatoid arthritis, unspecified: Secondary | ICD-10-CM | POA: Diagnosis not present

## 2021-10-17 DIAGNOSIS — M6281 Muscle weakness (generalized): Secondary | ICD-10-CM | POA: Diagnosis not present

## 2021-10-18 DIAGNOSIS — R2689 Other abnormalities of gait and mobility: Secondary | ICD-10-CM | POA: Diagnosis not present

## 2021-10-18 DIAGNOSIS — M069 Rheumatoid arthritis, unspecified: Secondary | ICD-10-CM | POA: Diagnosis not present

## 2021-10-18 DIAGNOSIS — M6281 Muscle weakness (generalized): Secondary | ICD-10-CM | POA: Diagnosis not present

## 2021-10-19 DIAGNOSIS — M6281 Muscle weakness (generalized): Secondary | ICD-10-CM | POA: Diagnosis not present

## 2021-10-19 DIAGNOSIS — R2689 Other abnormalities of gait and mobility: Secondary | ICD-10-CM | POA: Diagnosis not present

## 2021-10-19 DIAGNOSIS — M069 Rheumatoid arthritis, unspecified: Secondary | ICD-10-CM | POA: Diagnosis not present

## 2021-10-20 DIAGNOSIS — M6281 Muscle weakness (generalized): Secondary | ICD-10-CM | POA: Diagnosis not present

## 2021-10-20 DIAGNOSIS — L600 Ingrowing nail: Secondary | ICD-10-CM | POA: Diagnosis not present

## 2021-10-21 DIAGNOSIS — M6281 Muscle weakness (generalized): Secondary | ICD-10-CM | POA: Diagnosis not present

## 2021-10-25 DIAGNOSIS — M6281 Muscle weakness (generalized): Secondary | ICD-10-CM | POA: Diagnosis not present

## 2021-10-26 DIAGNOSIS — M6281 Muscle weakness (generalized): Secondary | ICD-10-CM | POA: Diagnosis not present

## 2021-10-27 DIAGNOSIS — M6281 Muscle weakness (generalized): Secondary | ICD-10-CM | POA: Diagnosis not present

## 2021-10-31 DIAGNOSIS — M6281 Muscle weakness (generalized): Secondary | ICD-10-CM | POA: Diagnosis not present

## 2021-11-01 DIAGNOSIS — R54 Age-related physical debility: Secondary | ICD-10-CM | POA: Diagnosis not present

## 2021-11-01 DIAGNOSIS — I10 Essential (primary) hypertension: Secondary | ICD-10-CM | POA: Diagnosis not present

## 2021-11-03 DIAGNOSIS — M6281 Muscle weakness (generalized): Secondary | ICD-10-CM | POA: Diagnosis not present

## 2021-11-04 DIAGNOSIS — M6281 Muscle weakness (generalized): Secondary | ICD-10-CM | POA: Diagnosis not present

## 2021-11-07 DIAGNOSIS — M6281 Muscle weakness (generalized): Secondary | ICD-10-CM | POA: Diagnosis not present

## 2021-11-09 DIAGNOSIS — M6281 Muscle weakness (generalized): Secondary | ICD-10-CM | POA: Diagnosis not present

## 2021-11-10 DIAGNOSIS — Z0189 Encounter for other specified special examinations: Secondary | ICD-10-CM | POA: Diagnosis not present

## 2021-11-10 DIAGNOSIS — M6281 Muscle weakness (generalized): Secondary | ICD-10-CM | POA: Diagnosis not present

## 2021-11-10 IMAGING — CR DG SHOULDER 2+V*L*
3 series · 3 of 3 positions shown · non-contrast
Comparison: None.

CLINICAL DATA: Fall.  Injury

EXAM:
LEFT SHOULDER - 2+ VIEW

[shoulder grashey]
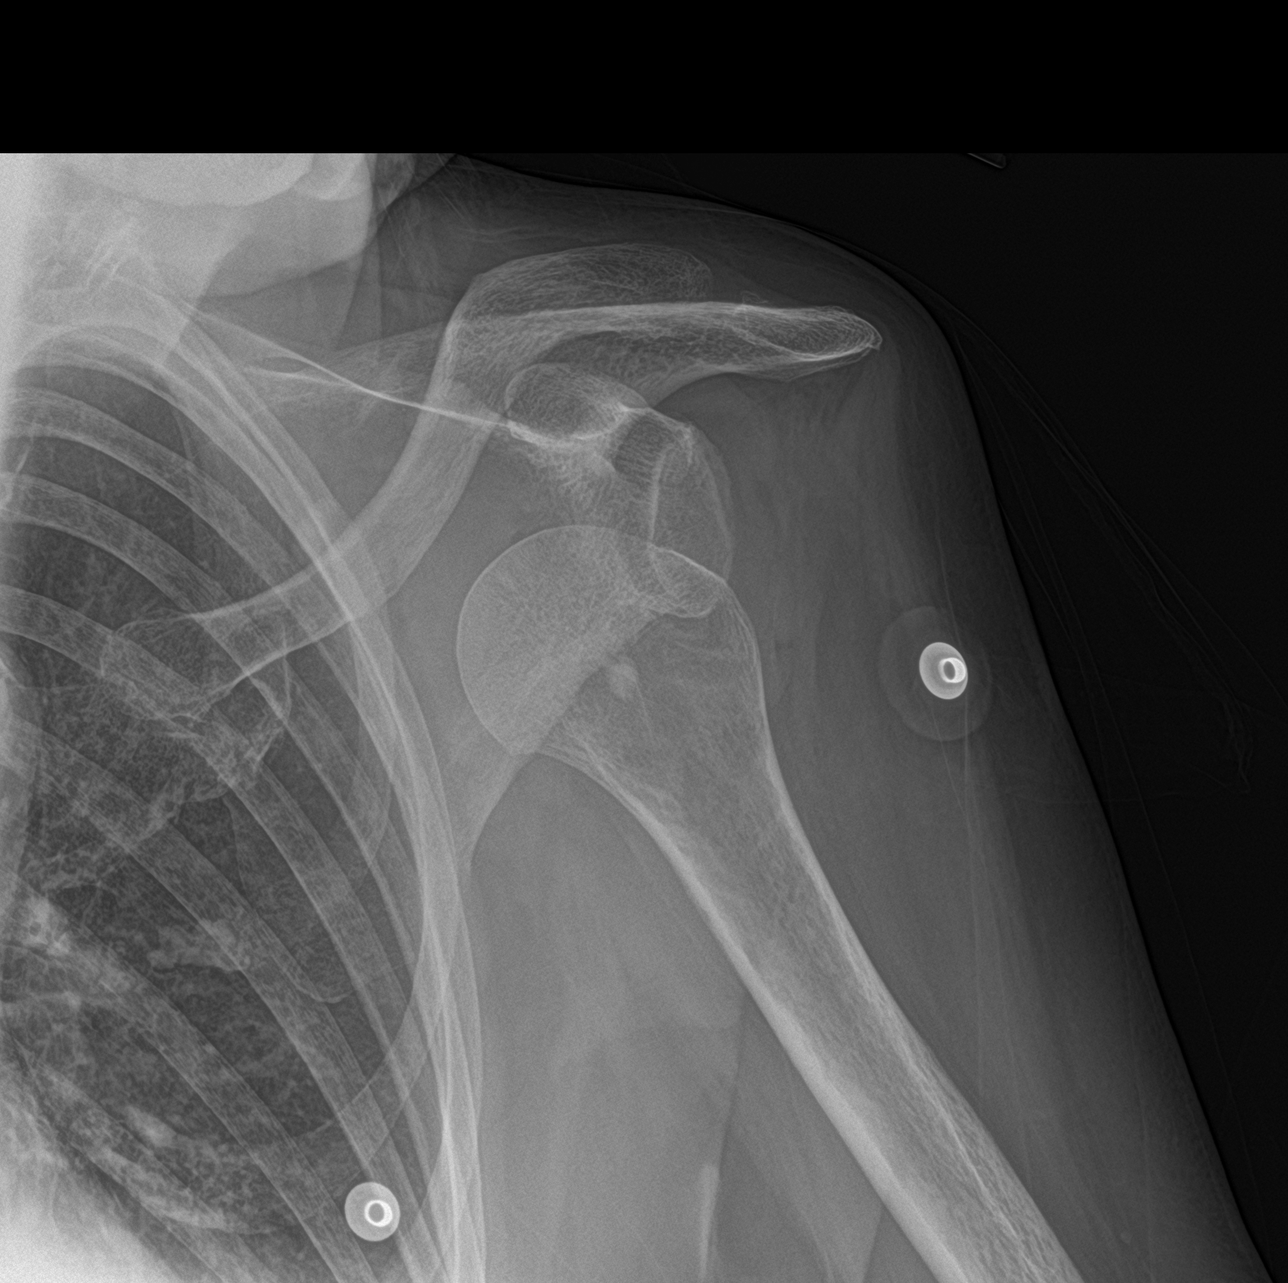

[shoulder y view]
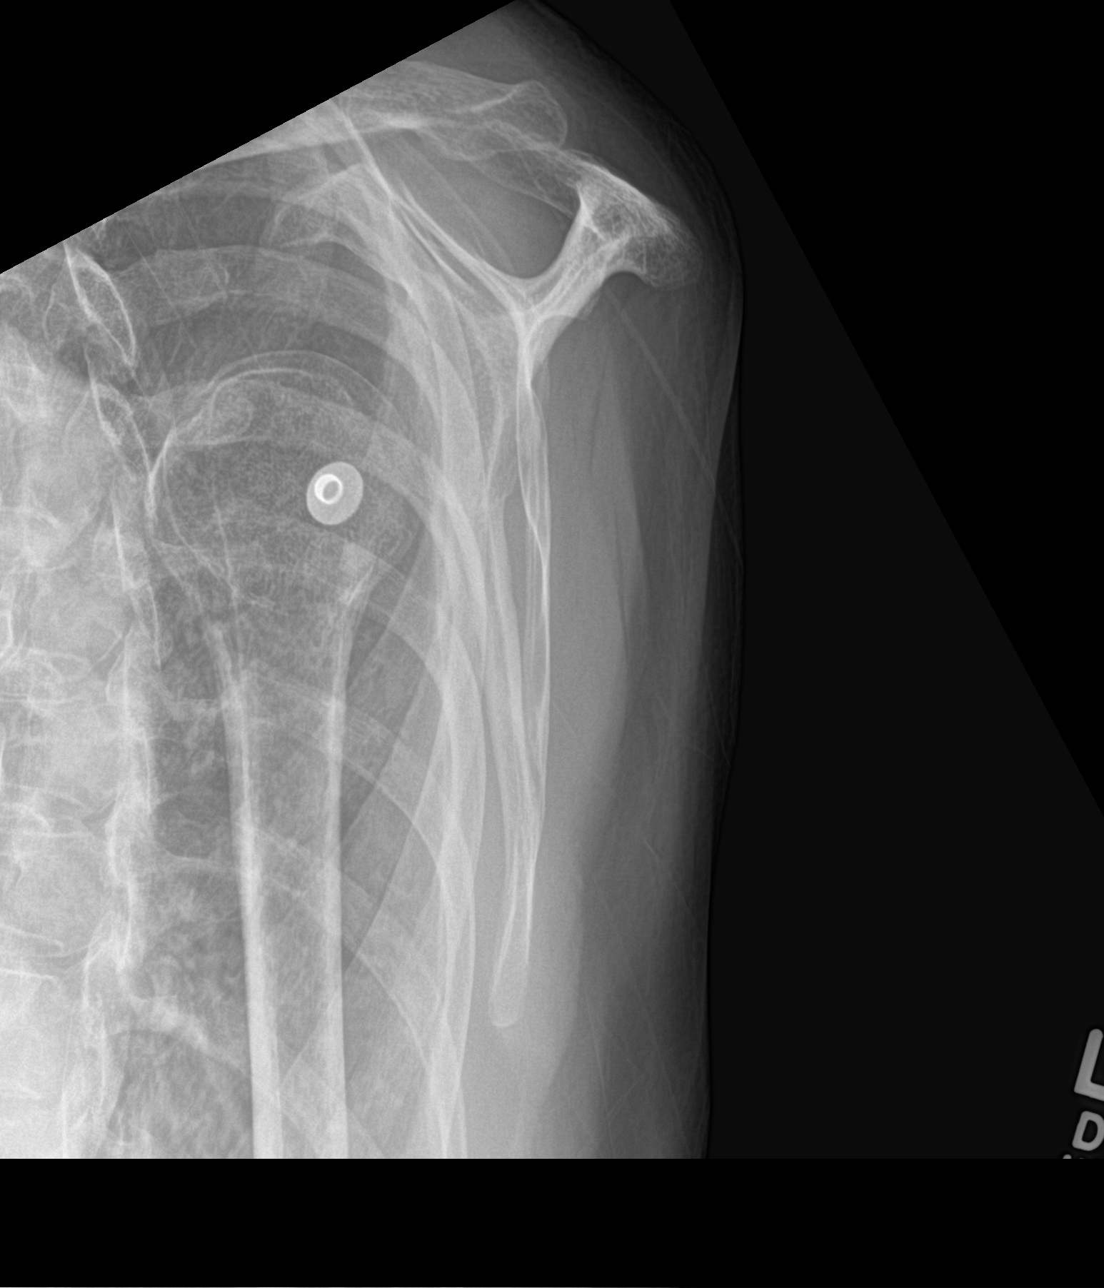

[shoulder ap neutral]
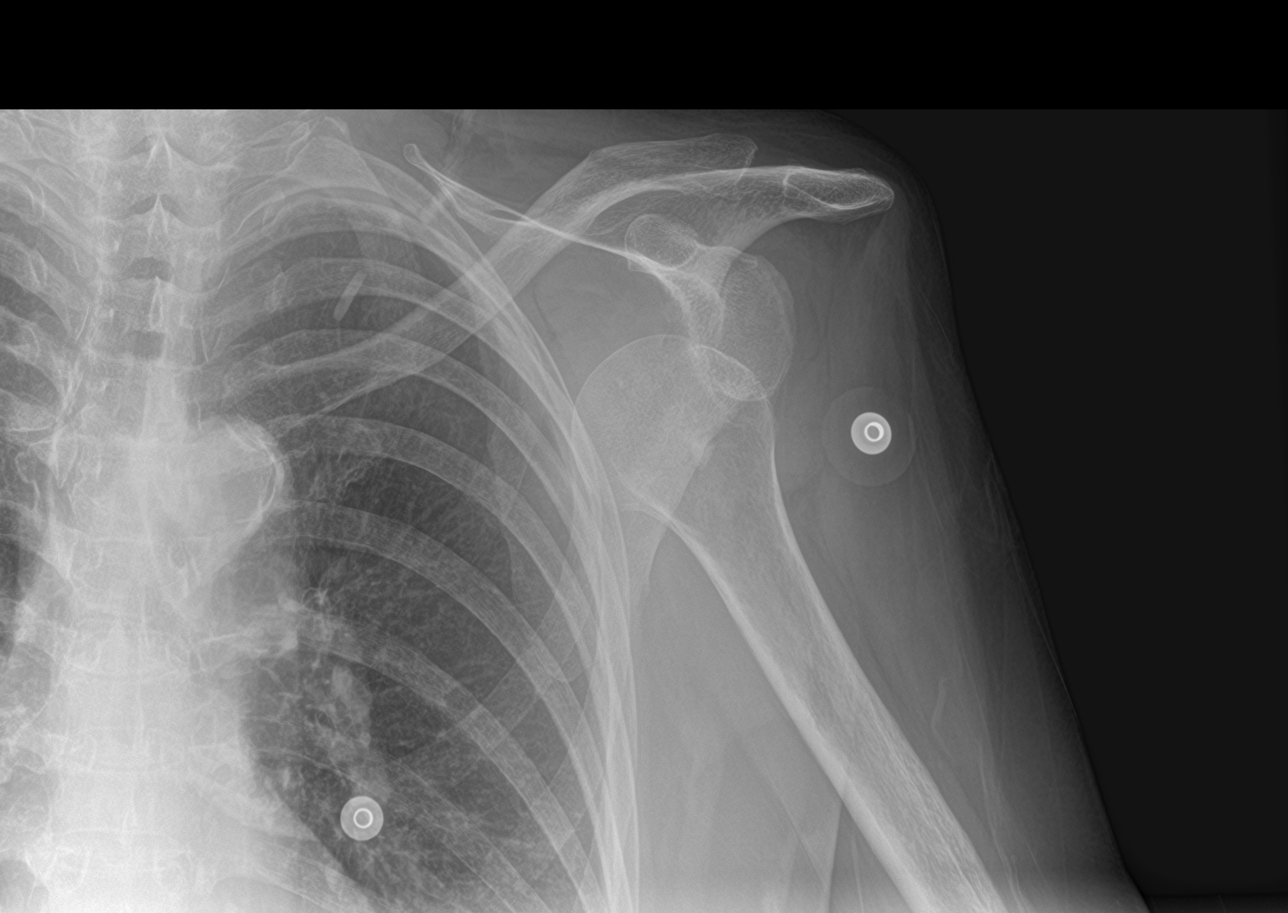

[3 of 3 positions shown; findings below may reference images not displayed]

FINDINGS: Anterior dislocation of the shoulder. Possible Hill-Sachs deformity.
No other fracture.
IMPRESSION: Anterior dislocation of shoulder. Possible Hill-Sachs deformity of
the humerus.

## 2021-11-10 IMAGING — CR DG SHOULDER 2+V*R*
3 series · 3 of 3 positions shown · non-contrast
Comparison: None.

CLINICAL DATA: Fall

EXAM:
RIGHT SHOULDER - 2+ VIEW

[shoulder grashey]
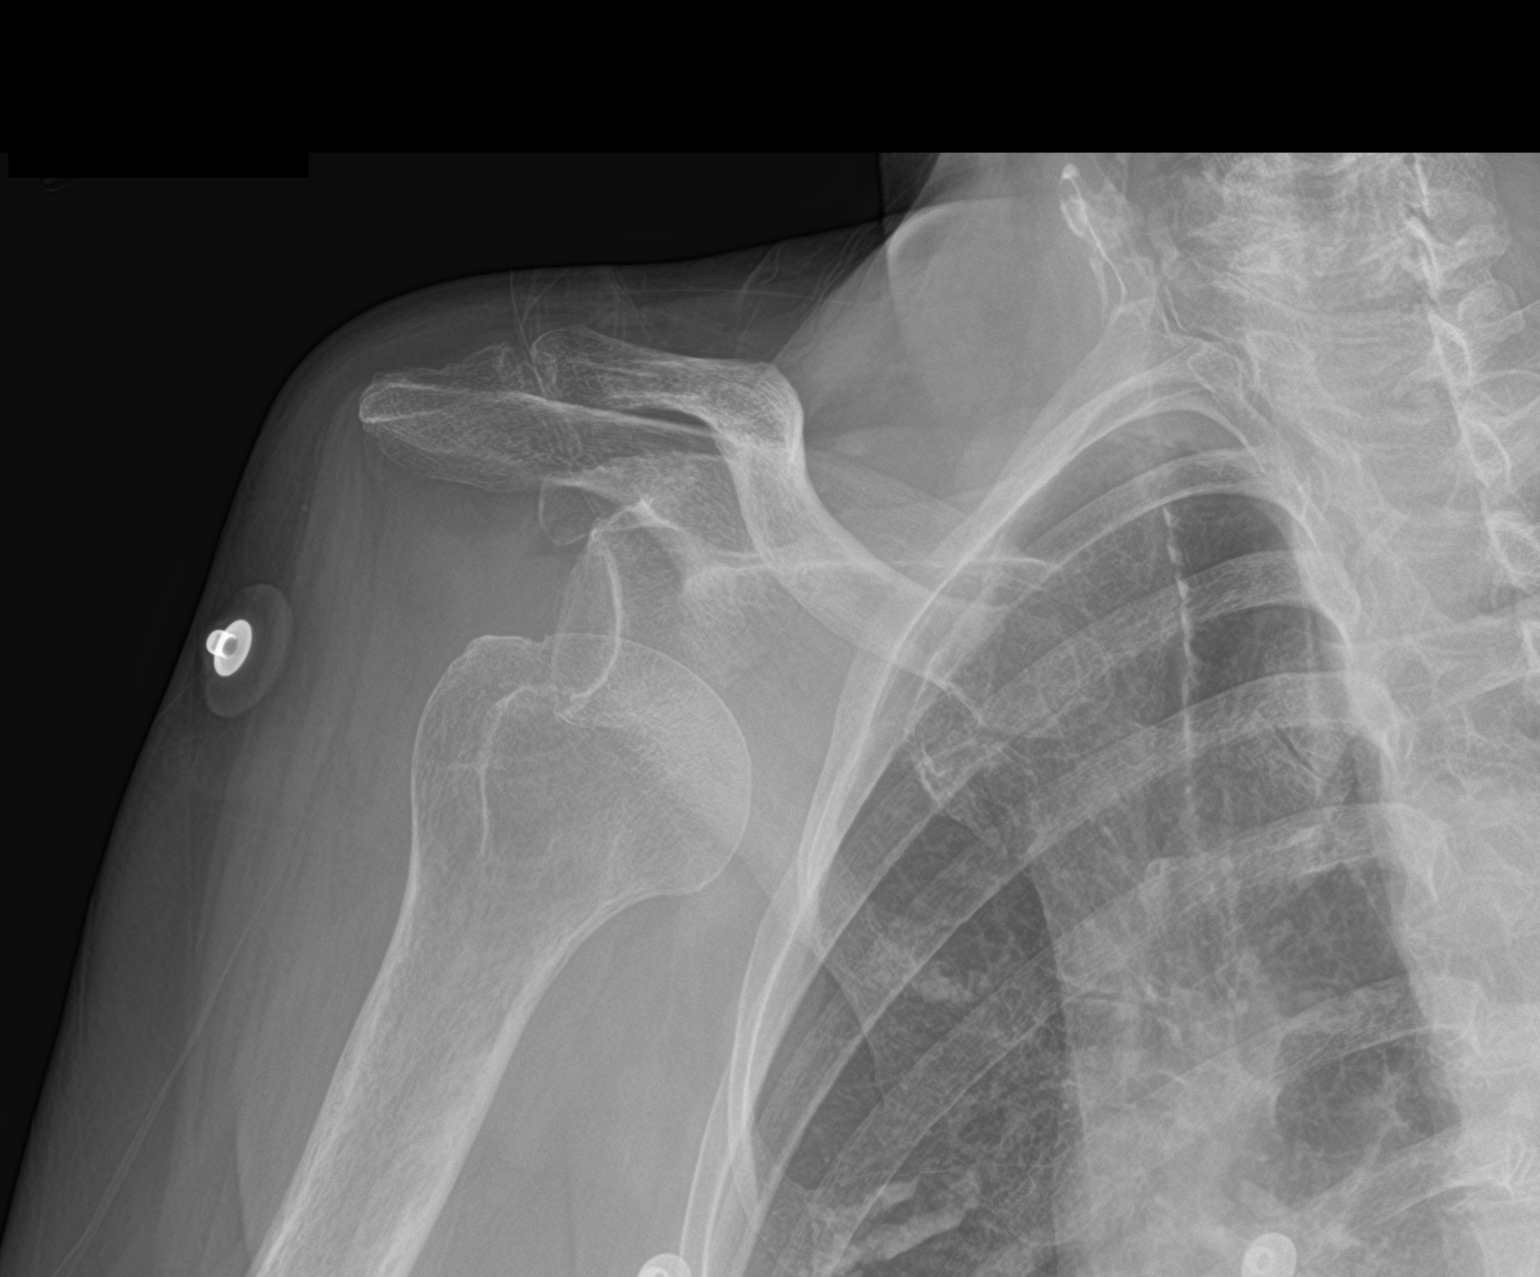

[shoulder y view]
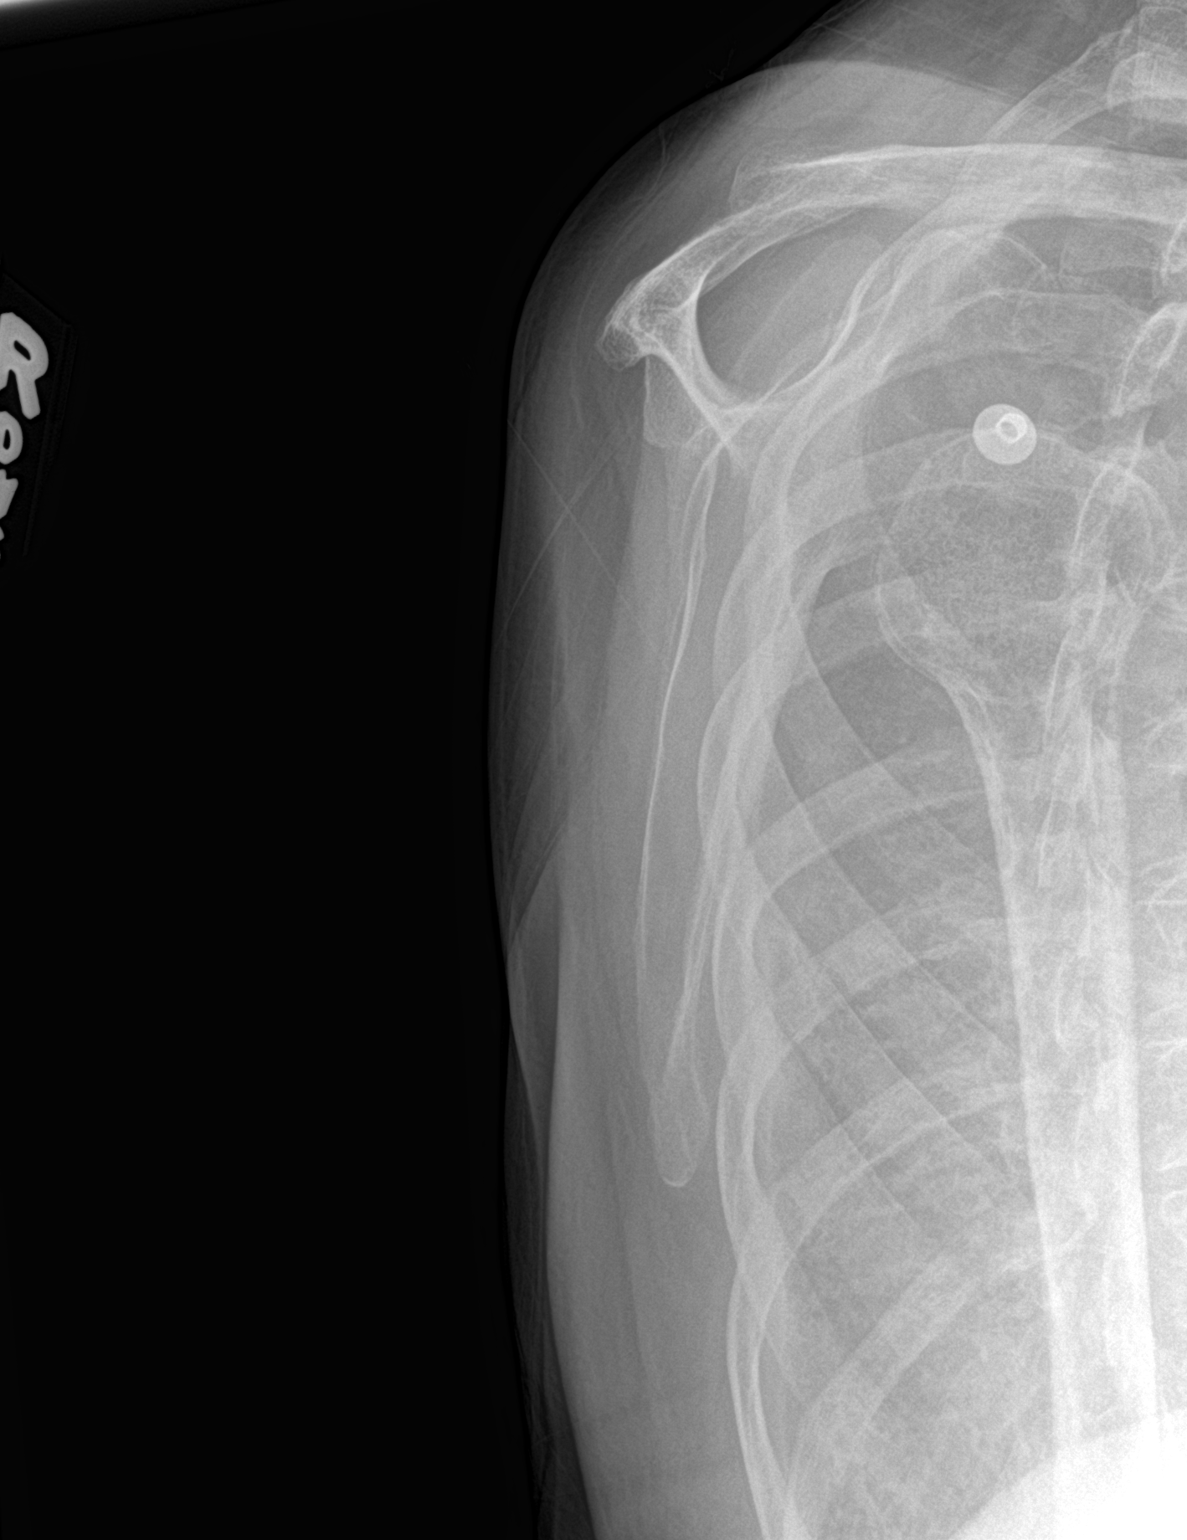

[shoulder ap neutral]
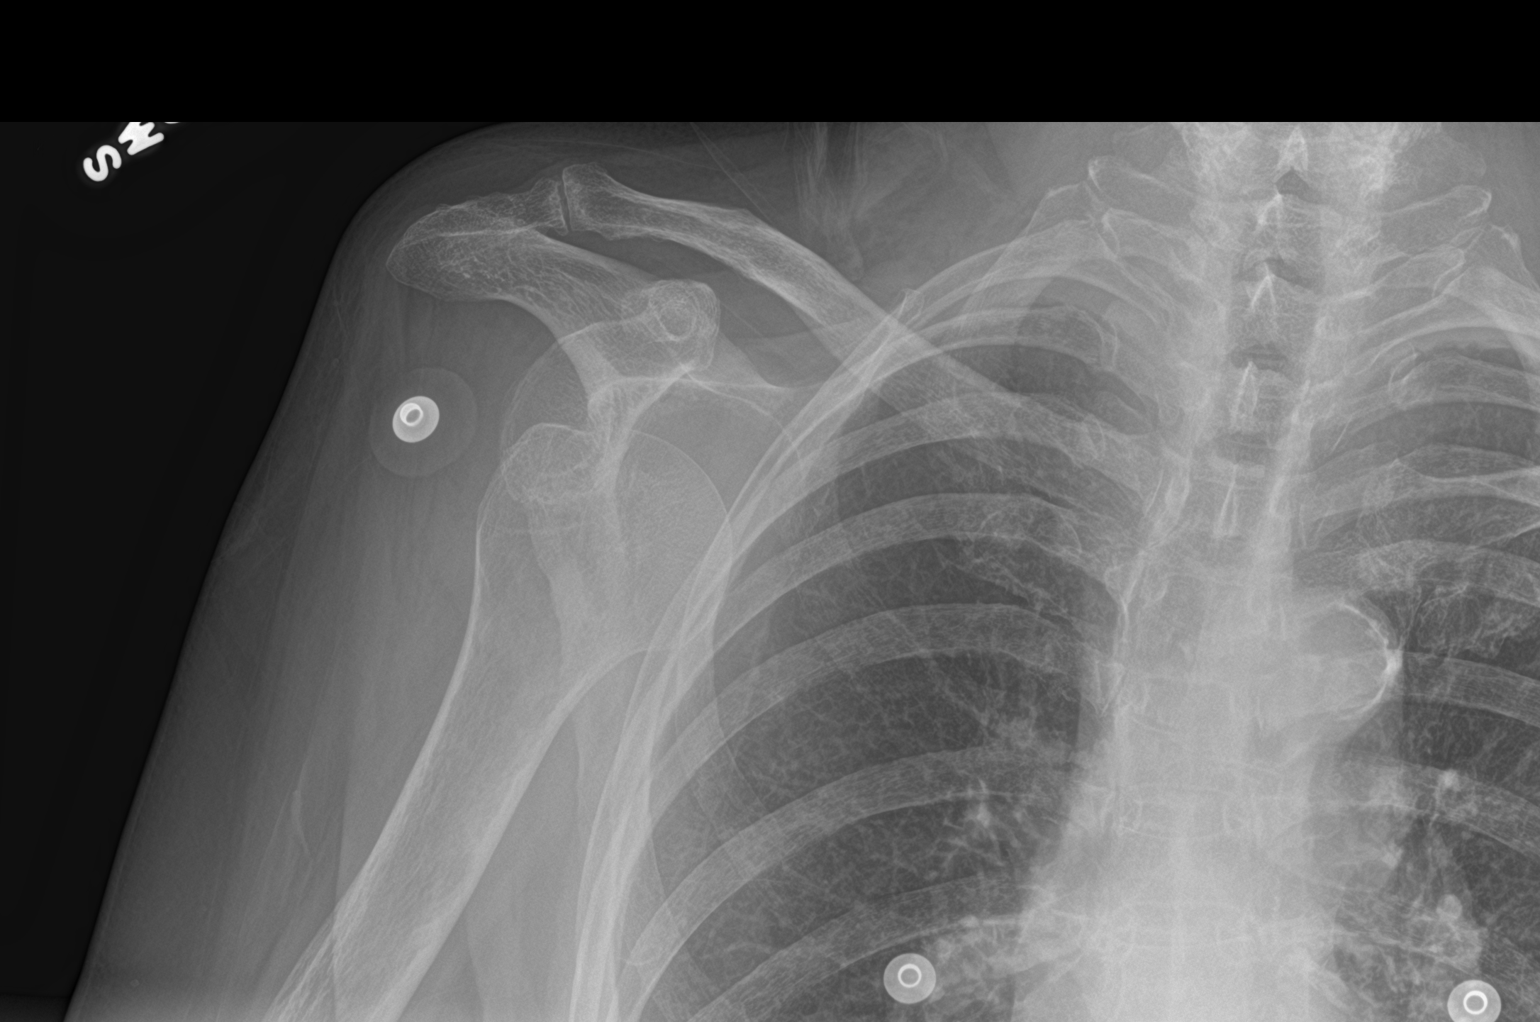

[3 of 3 positions shown; findings below may reference images not displayed]

FINDINGS: Anterior dislocation of the shoulder. No fracture. AC joint intact.
IMPRESSION: Anterior shoulder dislocation.

## 2021-11-10 IMAGING — DX DG CHEST 1V PORT
1 series · 1 of 1 positions shown · non-contrast
Comparison: November 13, 2020

CLINICAL DATA: Post fall.

EXAM:
PORTABLE CHEST 1 VIEW

[chest ap]
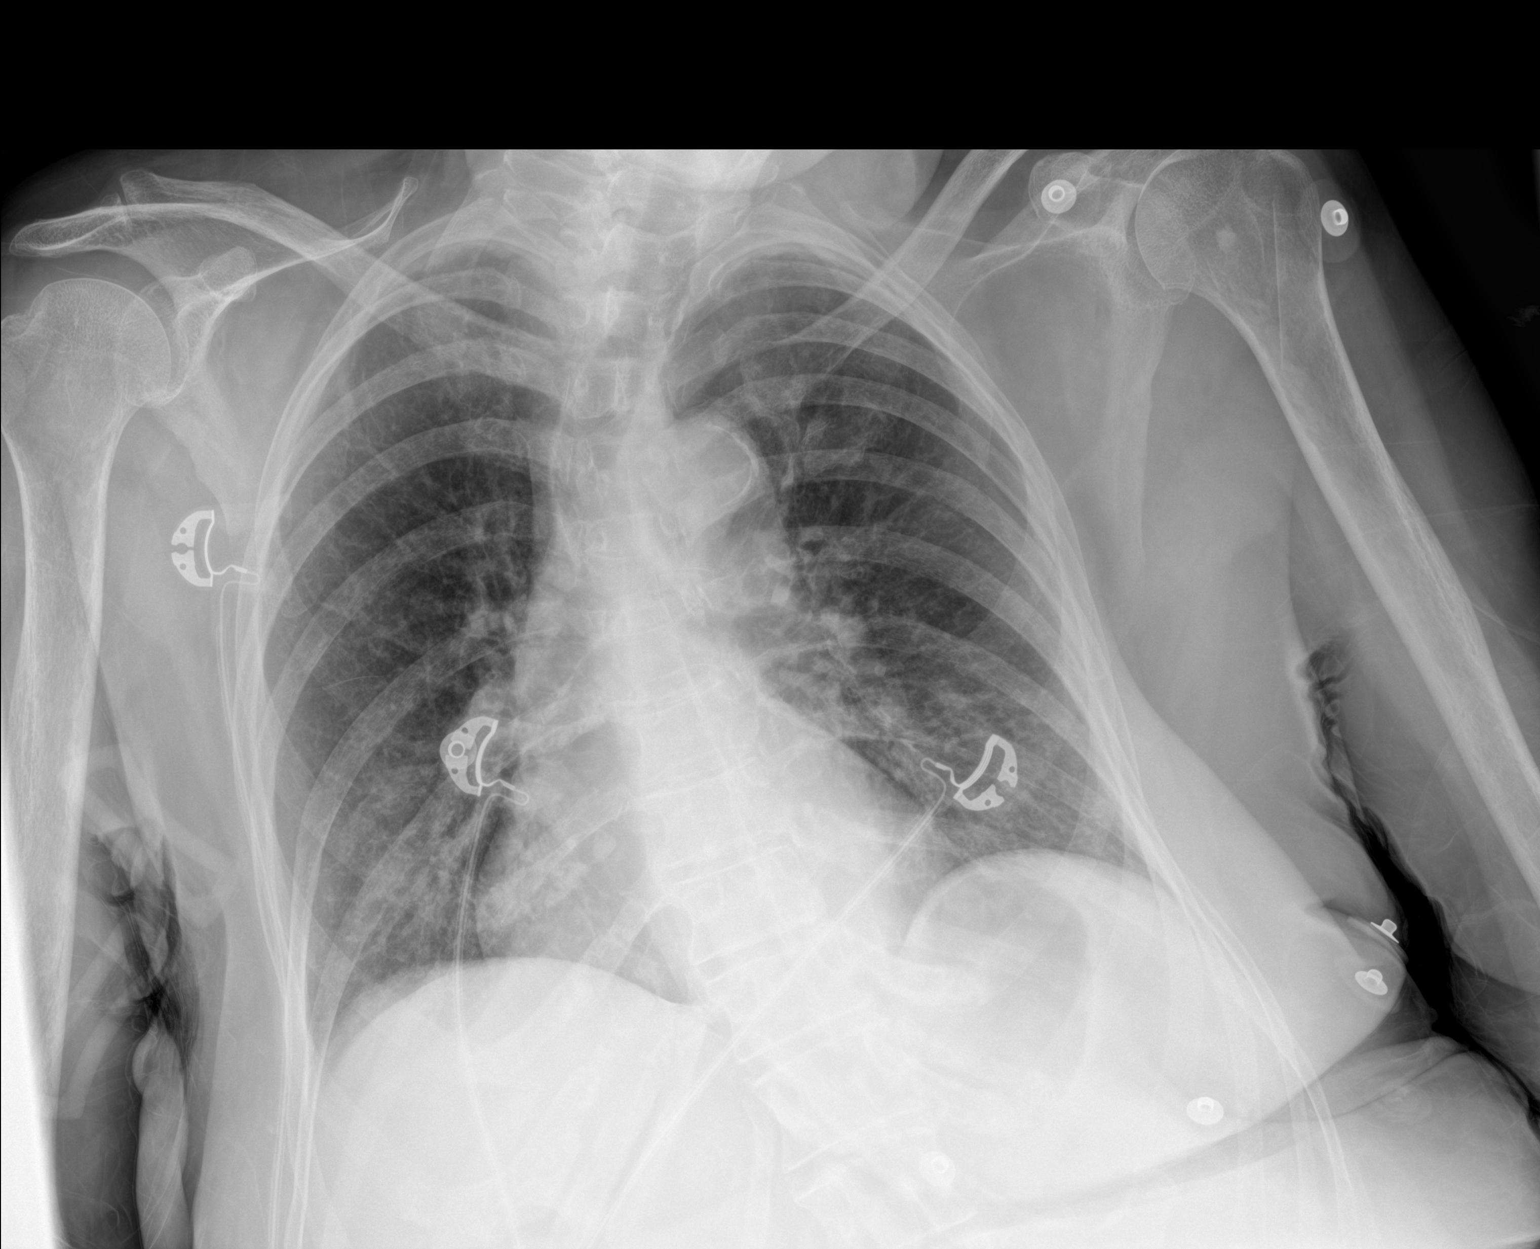

[1 of 1 positions shown; findings below may reference images not displayed]

FINDINGS: Cardiomediastinal silhouette is normal. Mediastinal contours appear
intact. Calcific atherosclerotic disease and tortuosity of the
aorta.

There is no evidence of focal airspace consolidation, pleural
effusion or pneumothorax.

Osseous structures are without acute abnormality. Soft tissues are
grossly normal.
IMPRESSION: 1. No active disease.
2. Calcific atherosclerotic disease and tortuosity of the aorta.

## 2021-11-10 IMAGING — DX DG SHOULDER 1V*L*
2 series · 2 of 2 positions shown · non-contrast
Comparison: 02/26/2021

CLINICAL DATA: Post reduction

EXAM:
LEFT SHOULDER

[shoulder ap (1 of 2)]
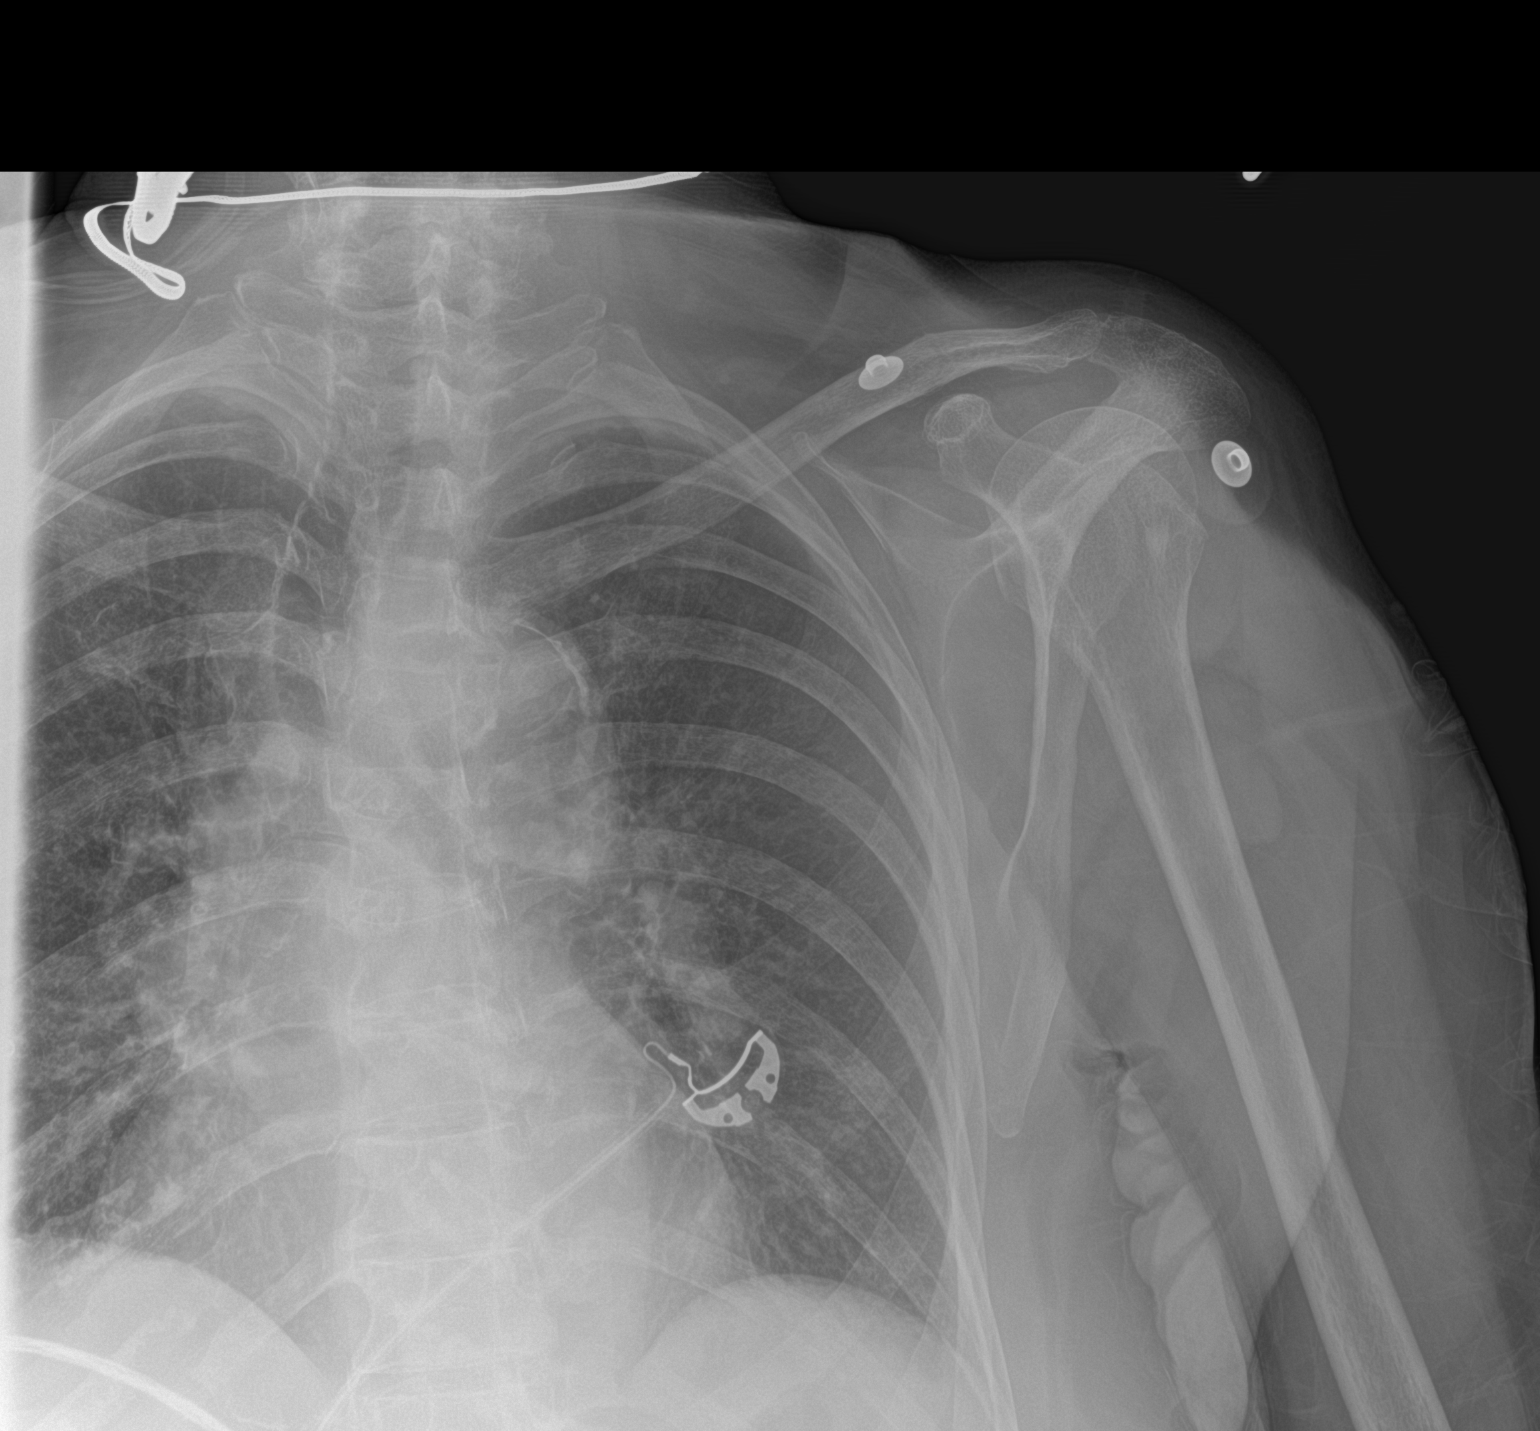

[shoulder ap (2 of 2)]
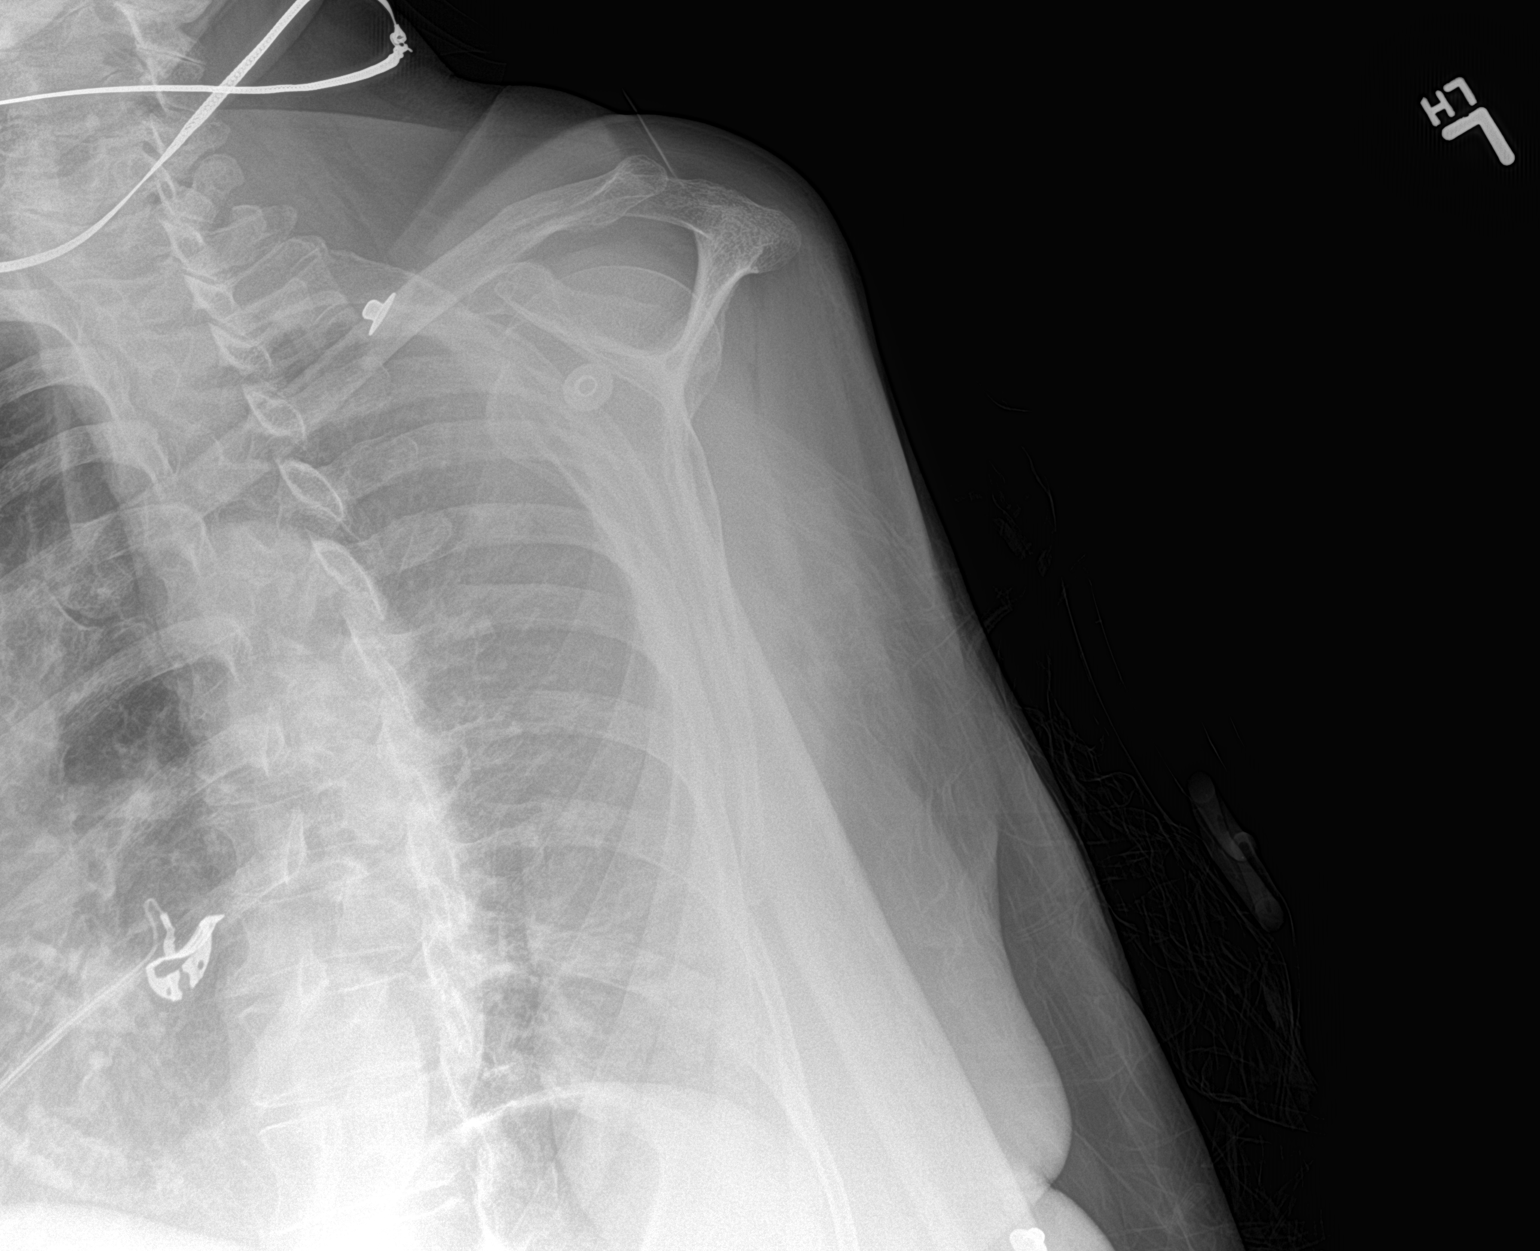

[2 of 2 positions shown; findings below may reference images not displayed]

FINDINGS: Satisfactory left shoulder reduction.  Hill-Sachs deformity.
IMPRESSION: Satisfactory shoulder reduction. Hill-Sachs deformity of the
humerus.

## 2021-11-11 DIAGNOSIS — M6281 Muscle weakness (generalized): Secondary | ICD-10-CM | POA: Diagnosis not present

## 2021-11-14 DIAGNOSIS — M6281 Muscle weakness (generalized): Secondary | ICD-10-CM | POA: Diagnosis not present

## 2021-11-15 DIAGNOSIS — M6281 Muscle weakness (generalized): Secondary | ICD-10-CM | POA: Diagnosis not present

## 2021-11-16 DIAGNOSIS — M6281 Muscle weakness (generalized): Secondary | ICD-10-CM | POA: Diagnosis not present

## 2021-11-17 DIAGNOSIS — M6281 Muscle weakness (generalized): Secondary | ICD-10-CM | POA: Diagnosis not present

## 2021-11-21 DIAGNOSIS — M6281 Muscle weakness (generalized): Secondary | ICD-10-CM | POA: Diagnosis not present

## 2021-11-23 DIAGNOSIS — M6281 Muscle weakness (generalized): Secondary | ICD-10-CM | POA: Diagnosis not present

## 2021-11-24 DIAGNOSIS — M6281 Muscle weakness (generalized): Secondary | ICD-10-CM | POA: Diagnosis not present

## 2021-11-28 DIAGNOSIS — M6281 Muscle weakness (generalized): Secondary | ICD-10-CM | POA: Diagnosis not present

## 2021-11-29 DIAGNOSIS — M6281 Muscle weakness (generalized): Secondary | ICD-10-CM | POA: Diagnosis not present

## 2021-11-30 DIAGNOSIS — M6281 Muscle weakness (generalized): Secondary | ICD-10-CM | POA: Diagnosis not present

## 2021-12-05 DIAGNOSIS — H4043X0 Glaucoma secondary to eye inflammation, bilateral, stage unspecified: Secondary | ICD-10-CM | POA: Diagnosis not present

## 2021-12-05 DIAGNOSIS — M069 Rheumatoid arthritis, unspecified: Secondary | ICD-10-CM | POA: Diagnosis not present

## 2021-12-05 DIAGNOSIS — M545 Low back pain, unspecified: Secondary | ICD-10-CM | POA: Diagnosis not present

## 2021-12-05 DIAGNOSIS — Z6821 Body mass index (BMI) 21.0-21.9, adult: Secondary | ICD-10-CM | POA: Diagnosis not present

## 2021-12-09 DIAGNOSIS — M5459 Other low back pain: Secondary | ICD-10-CM | POA: Diagnosis not present

## 2021-12-09 DIAGNOSIS — M6281 Muscle weakness (generalized): Secondary | ICD-10-CM | POA: Diagnosis not present

## 2021-12-09 DIAGNOSIS — R2681 Unsteadiness on feet: Secondary | ICD-10-CM | POA: Diagnosis not present

## 2021-12-12 DIAGNOSIS — M5459 Other low back pain: Secondary | ICD-10-CM | POA: Diagnosis not present

## 2021-12-12 DIAGNOSIS — R2681 Unsteadiness on feet: Secondary | ICD-10-CM | POA: Diagnosis not present

## 2021-12-12 DIAGNOSIS — M6281 Muscle weakness (generalized): Secondary | ICD-10-CM | POA: Diagnosis not present

## 2021-12-19 DIAGNOSIS — M6281 Muscle weakness (generalized): Secondary | ICD-10-CM | POA: Diagnosis not present

## 2021-12-19 DIAGNOSIS — R2681 Unsteadiness on feet: Secondary | ICD-10-CM | POA: Diagnosis not present

## 2021-12-19 DIAGNOSIS — M5459 Other low back pain: Secondary | ICD-10-CM | POA: Diagnosis not present

## 2021-12-21 DIAGNOSIS — M5459 Other low back pain: Secondary | ICD-10-CM | POA: Diagnosis not present

## 2021-12-21 DIAGNOSIS — R2681 Unsteadiness on feet: Secondary | ICD-10-CM | POA: Diagnosis not present

## 2021-12-21 DIAGNOSIS — M6281 Muscle weakness (generalized): Secondary | ICD-10-CM | POA: Diagnosis not present

## 2021-12-22 DIAGNOSIS — R2681 Unsteadiness on feet: Secondary | ICD-10-CM | POA: Diagnosis not present

## 2021-12-22 DIAGNOSIS — M5459 Other low back pain: Secondary | ICD-10-CM | POA: Diagnosis not present

## 2021-12-22 DIAGNOSIS — M6281 Muscle weakness (generalized): Secondary | ICD-10-CM | POA: Diagnosis not present

## 2021-12-23 DIAGNOSIS — M5459 Other low back pain: Secondary | ICD-10-CM | POA: Diagnosis not present

## 2021-12-23 DIAGNOSIS — M6281 Muscle weakness (generalized): Secondary | ICD-10-CM | POA: Diagnosis not present

## 2021-12-23 DIAGNOSIS — R2681 Unsteadiness on feet: Secondary | ICD-10-CM | POA: Diagnosis not present

## 2021-12-26 DIAGNOSIS — M5459 Other low back pain: Secondary | ICD-10-CM | POA: Diagnosis not present

## 2021-12-26 DIAGNOSIS — R2681 Unsteadiness on feet: Secondary | ICD-10-CM | POA: Diagnosis not present

## 2021-12-26 DIAGNOSIS — M6281 Muscle weakness (generalized): Secondary | ICD-10-CM | POA: Diagnosis not present

## 2021-12-27 DIAGNOSIS — R2681 Unsteadiness on feet: Secondary | ICD-10-CM | POA: Diagnosis not present

## 2021-12-27 DIAGNOSIS — M5459 Other low back pain: Secondary | ICD-10-CM | POA: Diagnosis not present

## 2021-12-27 DIAGNOSIS — M6281 Muscle weakness (generalized): Secondary | ICD-10-CM | POA: Diagnosis not present

## 2021-12-29 DIAGNOSIS — M5459 Other low back pain: Secondary | ICD-10-CM | POA: Diagnosis not present

## 2021-12-29 DIAGNOSIS — M6281 Muscle weakness (generalized): Secondary | ICD-10-CM | POA: Diagnosis not present

## 2021-12-29 DIAGNOSIS — R2681 Unsteadiness on feet: Secondary | ICD-10-CM | POA: Diagnosis not present

## 2021-12-30 DIAGNOSIS — Z0000 Encounter for general adult medical examination without abnormal findings: Secondary | ICD-10-CM | POA: Diagnosis not present

## 2021-12-30 DIAGNOSIS — M545 Low back pain, unspecified: Secondary | ICD-10-CM | POA: Diagnosis not present

## 2021-12-30 DIAGNOSIS — Z6821 Body mass index (BMI) 21.0-21.9, adult: Secondary | ICD-10-CM | POA: Diagnosis not present

## 2021-12-30 DIAGNOSIS — M069 Rheumatoid arthritis, unspecified: Secondary | ICD-10-CM | POA: Diagnosis not present

## 2021-12-30 DIAGNOSIS — Z780 Asymptomatic menopausal state: Secondary | ICD-10-CM | POA: Diagnosis not present

## 2021-12-30 DIAGNOSIS — Z1239 Encounter for other screening for malignant neoplasm of breast: Secondary | ICD-10-CM | POA: Diagnosis not present

## 2021-12-30 DIAGNOSIS — E119 Type 2 diabetes mellitus without complications: Secondary | ICD-10-CM | POA: Diagnosis not present

## 2022-01-02 DIAGNOSIS — M6281 Muscle weakness (generalized): Secondary | ICD-10-CM | POA: Diagnosis not present

## 2022-01-02 DIAGNOSIS — M5459 Other low back pain: Secondary | ICD-10-CM | POA: Diagnosis not present

## 2022-01-02 DIAGNOSIS — R2681 Unsteadiness on feet: Secondary | ICD-10-CM | POA: Diagnosis not present

## 2022-01-04 DIAGNOSIS — M6281 Muscle weakness (generalized): Secondary | ICD-10-CM | POA: Diagnosis not present

## 2022-01-04 DIAGNOSIS — M5459 Other low back pain: Secondary | ICD-10-CM | POA: Diagnosis not present

## 2022-01-04 DIAGNOSIS — R2681 Unsteadiness on feet: Secondary | ICD-10-CM | POA: Diagnosis not present

## 2022-01-05 DIAGNOSIS — M6281 Muscle weakness (generalized): Secondary | ICD-10-CM | POA: Diagnosis not present

## 2022-01-05 DIAGNOSIS — R2681 Unsteadiness on feet: Secondary | ICD-10-CM | POA: Diagnosis not present

## 2022-01-05 DIAGNOSIS — M5459 Other low back pain: Secondary | ICD-10-CM | POA: Diagnosis not present

## 2022-01-06 DIAGNOSIS — M6281 Muscle weakness (generalized): Secondary | ICD-10-CM | POA: Diagnosis not present

## 2022-01-06 DIAGNOSIS — M5459 Other low back pain: Secondary | ICD-10-CM | POA: Diagnosis not present

## 2022-01-06 DIAGNOSIS — R2681 Unsteadiness on feet: Secondary | ICD-10-CM | POA: Diagnosis not present

## 2022-01-09 DIAGNOSIS — M6281 Muscle weakness (generalized): Secondary | ICD-10-CM | POA: Diagnosis not present

## 2022-01-09 DIAGNOSIS — R2681 Unsteadiness on feet: Secondary | ICD-10-CM | POA: Diagnosis not present

## 2022-01-09 DIAGNOSIS — M5459 Other low back pain: Secondary | ICD-10-CM | POA: Diagnosis not present

## 2022-01-11 DIAGNOSIS — M6281 Muscle weakness (generalized): Secondary | ICD-10-CM | POA: Diagnosis not present

## 2022-01-11 DIAGNOSIS — M5459 Other low back pain: Secondary | ICD-10-CM | POA: Diagnosis not present

## 2022-01-11 DIAGNOSIS — R2681 Unsteadiness on feet: Secondary | ICD-10-CM | POA: Diagnosis not present

## 2022-01-13 DIAGNOSIS — M5459 Other low back pain: Secondary | ICD-10-CM | POA: Diagnosis not present

## 2022-01-13 DIAGNOSIS — R2681 Unsteadiness on feet: Secondary | ICD-10-CM | POA: Diagnosis not present

## 2022-01-13 DIAGNOSIS — M6281 Muscle weakness (generalized): Secondary | ICD-10-CM | POA: Diagnosis not present

## 2022-01-16 DIAGNOSIS — M5459 Other low back pain: Secondary | ICD-10-CM | POA: Diagnosis not present

## 2022-01-16 DIAGNOSIS — R2681 Unsteadiness on feet: Secondary | ICD-10-CM | POA: Diagnosis not present

## 2022-01-16 DIAGNOSIS — M6281 Muscle weakness (generalized): Secondary | ICD-10-CM | POA: Diagnosis not present

## 2022-01-18 DIAGNOSIS — L600 Ingrowing nail: Secondary | ICD-10-CM | POA: Diagnosis not present

## 2022-01-19 DIAGNOSIS — Z6821 Body mass index (BMI) 21.0-21.9, adult: Secondary | ICD-10-CM | POA: Diagnosis not present

## 2022-01-19 DIAGNOSIS — H1011 Acute atopic conjunctivitis, right eye: Secondary | ICD-10-CM | POA: Diagnosis not present

## 2022-01-20 DIAGNOSIS — M5459 Other low back pain: Secondary | ICD-10-CM | POA: Diagnosis not present

## 2022-01-20 DIAGNOSIS — R2681 Unsteadiness on feet: Secondary | ICD-10-CM | POA: Diagnosis not present

## 2022-01-20 DIAGNOSIS — M6281 Muscle weakness (generalized): Secondary | ICD-10-CM | POA: Diagnosis not present

## 2022-01-25 DIAGNOSIS — R2681 Unsteadiness on feet: Secondary | ICD-10-CM | POA: Diagnosis not present

## 2022-01-25 DIAGNOSIS — M5459 Other low back pain: Secondary | ICD-10-CM | POA: Diagnosis not present

## 2022-01-25 DIAGNOSIS — M6281 Muscle weakness (generalized): Secondary | ICD-10-CM | POA: Diagnosis not present

## 2022-01-26 DIAGNOSIS — M6281 Muscle weakness (generalized): Secondary | ICD-10-CM | POA: Diagnosis not present

## 2022-01-26 DIAGNOSIS — M5459 Other low back pain: Secondary | ICD-10-CM | POA: Diagnosis not present

## 2022-01-26 DIAGNOSIS — R2681 Unsteadiness on feet: Secondary | ICD-10-CM | POA: Diagnosis not present

## 2022-01-27 DIAGNOSIS — R2681 Unsteadiness on feet: Secondary | ICD-10-CM | POA: Diagnosis not present

## 2022-01-27 DIAGNOSIS — M5459 Other low back pain: Secondary | ICD-10-CM | POA: Diagnosis not present

## 2022-01-27 DIAGNOSIS — M6281 Muscle weakness (generalized): Secondary | ICD-10-CM | POA: Diagnosis not present

## 2022-01-31 DIAGNOSIS — M6281 Muscle weakness (generalized): Secondary | ICD-10-CM | POA: Diagnosis not present

## 2022-01-31 DIAGNOSIS — M5459 Other low back pain: Secondary | ICD-10-CM | POA: Diagnosis not present

## 2022-01-31 DIAGNOSIS — R2681 Unsteadiness on feet: Secondary | ICD-10-CM | POA: Diagnosis not present

## 2022-02-01 DIAGNOSIS — R2681 Unsteadiness on feet: Secondary | ICD-10-CM | POA: Diagnosis not present

## 2022-02-01 DIAGNOSIS — M6281 Muscle weakness (generalized): Secondary | ICD-10-CM | POA: Diagnosis not present

## 2022-02-01 DIAGNOSIS — M5459 Other low back pain: Secondary | ICD-10-CM | POA: Diagnosis not present

## 2022-02-02 DIAGNOSIS — M6281 Muscle weakness (generalized): Secondary | ICD-10-CM | POA: Diagnosis not present

## 2022-02-02 DIAGNOSIS — M5459 Other low back pain: Secondary | ICD-10-CM | POA: Diagnosis not present

## 2022-02-02 DIAGNOSIS — R2681 Unsteadiness on feet: Secondary | ICD-10-CM | POA: Diagnosis not present

## 2022-02-07 DIAGNOSIS — E119 Type 2 diabetes mellitus without complications: Secondary | ICD-10-CM | POA: Diagnosis not present

## 2022-02-07 DIAGNOSIS — M6281 Muscle weakness (generalized): Secondary | ICD-10-CM | POA: Diagnosis not present

## 2022-02-07 DIAGNOSIS — R600 Localized edema: Secondary | ICD-10-CM | POA: Diagnosis not present

## 2022-02-07 DIAGNOSIS — R2681 Unsteadiness on feet: Secondary | ICD-10-CM | POA: Diagnosis not present

## 2022-02-07 DIAGNOSIS — R269 Unspecified abnormalities of gait and mobility: Secondary | ICD-10-CM | POA: Diagnosis not present

## 2022-02-07 DIAGNOSIS — M5459 Other low back pain: Secondary | ICD-10-CM | POA: Diagnosis not present

## 2022-02-07 DIAGNOSIS — F5101 Primary insomnia: Secondary | ICD-10-CM | POA: Diagnosis not present

## 2022-02-28 ENCOUNTER — Telehealth: Payer: Medicare Other

## 2022-02-28 NOTE — Progress Notes (Deleted)
Chronic Care Management Pharmacy Note  02/28/2022 Name:  Diana Roberson MRN:  [id number] DOB:  [birth year]  Summary: ***  Recommendations/Changes made from today's visit: ***  Plan: ***   Subjective: Diana Roberson is an 76 y.o. year old female who is a primary patient of Letvak, Theophilus Kinds, MD.  The CCM team was consulted for assistance with disease management and care coordination needs.    {CCMTELEPHONEFACETOFACE:21091510} for {CCMINITIALFOLLOWUPCHOICE:21091511} in response to provider referral for pharmacy case management and/or care coordination services.   Consent to Services:  {CCMCONSENTOPTIONS:25074}  Patient Care Team: Venia Carbon, MD as PCP - General (Internal Medicine) Debbora Dus, [facility] as Pharmacist (Pharmacist)  Recent office visits: ***  Recent consult visits: [facility] Care visits: {Hospital DC Yes/No:25215}   Objective:  Lab Results  Component Value Date   CREATININE 0.89 02/27/2021   BUN 24 (H) 02/27/2021   GFR 67.03 07/19/2020   GFRNONAA >60 02/27/2021   GFRAA 77 01/17/2016   NA 137 02/27/2021   K 4.3 02/27/2021   CALCIUM 9.1 02/27/2021   CO2 27 02/27/2021   GLUCOSE 171 (H) 02/27/2021    Lab Results  Component Value Date/Time   HGBA1C 5.7 (A) 02/21/2021 02:53 PM   HGBA1C 6.2 (H) 11/13/2020 11:59 PM   HGBA1C 7.7 (A) 06/16/2020 11:13 AM   HGBA1C 7.2 (H) 01/14/2019 02:26 PM   GFR 67.03 07/19/2020 04:38 PM   GFR 62.64 03/02/2020 11:09 AM   MICROALBUR 1.7 04/30/2015 12:43 PM   MICROALBUR 2.1 (H) 06/09/2013 12:54 PM    Last diabetic Eye exam:  Lab Results  Component Value Date/Time   HMDIABEYEEXA No Retinopathy 11/04/2020 12:00 AM    Last diabetic Foot exam:  Lab Results  Component Value Date/Time   HMDIABFOOTEX done 01/15/2020 12:00 AM     Lab Results  Component Value Date   CHOL 112 07/19/2020   HDL 53.00 07/19/2020   LDLCALC 42 07/19/2020   LDLDIRECT 127.5 04/05/2010   TRIG 85.0 07/19/2020   CHOLHDL 2  07/19/2020       Latest Ref Rng & Units 02/26/2021    2:12 PM 12/25/2020   10:31 PM 11/14/2020    4:12 AM  Hepatic Function  Total Protein 6.5 - 8.1 g/dL 7.6  7.1  6.3   Albumin 3.5 - 5.0 g/dL 4.2  3.7  3.4   AST 15 - 41 U/L 33  28  22   ALT 0 - 44 U/L _0 Alk Phosphatase 38 - 126 U/L 89  100  99   Total Bilirubin 0.3 - 1.2 mg/dL 1.0  0.8  0.6     Lab Results  Component Value Date/Time   TSH 1.482 02/27/2021 08:00 AM   TSH 1.339 12/25/2020 10:31 PM   TSH 1.18 02/09/2014 12:54 PM   TSH 0.79 06/09/2013 12:54 PM   FREET4 1.20 (H) 11/13/2020 11:59 PM   FREET4 0.93 07/19/2020 04:38 PM       Latest Ref Rng & Units 02/27/2021    8:00 AM 02/26/2021    2:12 PM 12/25/2020    9:08 PM  CBC  WBC 4.0 - 10.5 K/uL 6.4  14.0  6.4   Hemoglobin 12.0 - 15.0 g/dL 10.6  12.0  10.2   Hematocrit 36.0 - 46.0 % 30.5  35.3  30.2   Platelets 150 - 400 K/uL 231  311  283     No results found for: "VD25OH"  Clinical ASCVD: {YES/NO:21197} The ASCVD Risk  score (Arnett DK, et al., 2019) failed to calculate for the following reasons:   The patient has a prior MI or stroke diagnosis       07/19/2020    3:38 PM 07/17/2019    3:03 PM 11/01/2017    4:29 PM  Depression screen PHQ 2/9  Decreased Interest 0 0 0  Down, Depressed, Hopeless 0 0 0  PHQ - 2 Score 0 0 0     ***Other: (CHADS2VASc if Afib, MMRC or CAT for COPD, ACT, DEXA)  Social History   Tobacco Use  Smoking Status Never  Smokeless Tobacco Never   BP Readings from Last 3 Encounters:  03/08/21 110/70  03/07/21 131/62  02/27/21 (!) 173/91   Pulse Readings from Last 3 Encounters:  03/08/21 78  03/07/21 80  02/27/21 97   Wt Readings from Last 3 Encounters:  03/08/21 126 lb (57.2 kg)  03/07/21 130 lb 4.7 oz (59.1 kg)  02/26/21 130 lb 4.7 oz (59.1 kg)   BMI Readings from Last 3 Encounters:  03/08/21 21.63 kg/m  03/07/21 22.36 kg/m  02/26/21 22.36 kg/m    Assessment/Interventions: Review of patient past medical  history, allergies, medications, health status, including review of consultants reports, laboratory and other test data, was performed as part of comprehensive evaluation and provision of chronic care management services.   SDOH:  (Social Determinants of Health) assessments and interventions performed: {yes/no:20286}  SDOH Screenings   Alcohol Screen: Not on file  Depression (PHQ2-9): Low Risk  (07/19/2020)   Depression (PHQ2-9)    PHQ-2 Score: 0  Financial Resource Strain: Low Risk  (03/07/2021)   Overall Financial Resource Strain (CARDIA)    Difficulty of Paying Living Expenses: Not very hard  Food Insecurity: Not on file  Housing: Not on file  Physical Activity: Not on file  Social Connections: Not on file  Stress: Not on file  Tobacco Use: Low Risk  (03/08/2021)   Patient History    Smoking Tobacco Use: Never    Smokeless Tobacco Use: Never    Passive Exposure: Not on file  Transportation Needs: Not on file    [place]  Allergies  Allergen Reactions   Codeine Sulfate Other (See Comments)    Indigestion and epigastric pain   Sertraline Hcl Other (See Comments)    REACTION: Worse, ? irritable    Medications Reviewed Today     Reviewed by Pilar Grammes, CMA (Certified Medical Assistant) on 03/18/21 at [place] List Status: <None>   Medication Order Taking? Sig Documenting Provider Last Dose Status Informant  aspirin EC 81 MG tablet [id number] Yes Take 81 mg by mouth daily. [provider] Taking Active Self  brimonidine (ALPHAGAN) 0.2 % ophthalmic solution [id number] Yes Place 1 drop into both eyes 2 (two) times daily. [provider] Taking Active Self  cholecalciferol (VITAMIN D) 1000 UNITS tablet [id number] Yes Take 1,000 Units by mouth daily. [provider] Taking Active Self  dorzolamide-timolol (COSOPT) 22.3-6.8 MG/ML ophthalmic solution [id number] Yes Place 1 drop into both eyes 2 (two) times daily. [provider] Taking  Active Self  folic acid (FOLVITE) 1 MG tablet [id number] Yes Take 1 tablet (1 mg total) by mouth daily. Venia Carbon, MD Taking Active Self  glucose blood [facility] VERIO) test strip [id number]  Use to check blood sugar once a day. Dx Code E11.9 Venia Carbon, MD  Active Self  HYDROcodone-acetaminophen [facility]) 5-325 MG tablet [id number] No Take 1 tablet by mouth every 6 (six)  hours as needed for moderate pain.  Patient not taking: Reported on 03/18/2021   Paulette Blanch, MD Not Taking Active   hydroxychloroquine (PLAQUENIL) 200 MG tablet [id number] Yes Take 200 mg by mouth daily. [provider] Taking Active Self  lisinopril (ZESTRIL) 10 MG tablet [id number] Yes Take 1 tablet (10 mg total) by mouth daily. Venia Carbon, MD Taking Active Self  methotrexate [facility]) 2.5 MG tablet [id number] Yes Take 7 tablets (17.5 mg total) by mouth once a week. Caution:Chemotherapy. Protect from light. Venia Carbon, MD Taking Active Self           Med Note Deedra Ehrich Feb 27, 2021 12:41 AM) Friday   OneTouch Delica Lancets 33A MISC [id number]  1 each by Does not apply route daily. Dx Code E11.9 Venia Carbon, MD  Active Self  QUEtiapine (SEROQUEL) 25 MG tablet [id number] Yes Take 0.5-1 tablets (12.5-25 mg total) by mouth at bedtime.  Patient taking differently: Take 25 mg by mouth at bedtime.   Venia Carbon, MD Taking Active Self  vitamin B-12 (CYANOCOBALAMIN) 1000 MCG tablet [id number] Yes Take 1,000 mcg by mouth daily. [provider] Taking Active Self  vitamin E 400 UNIT capsule [id number] Yes Take 400 Units by mouth daily. [provider] Taking Active Self  ziprasidone (GEODON) 20 MG capsule [id number]  Take 1 capsule (20 mg total) by mouth in the morning and at bedtime.  Patient taking differently: Take 20 mg by mouth at bedtime.   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   Seropositive rheumatoid arthritis ([place]) 12/28/2015   Glaucoma 04/30/2015   Advance directive discussed with patient 04/30/2015   Routine general medical examination at a health care facility 12/06/2012   CAROTID BRUIT 11/08/2010   Bipolar 1 disorder, manic, full remission ([place]) 10/01/2009   OSTEOPENIA 07/26/2007   Hyperlipemia 01/22/2007   DIVERTICULOSIS, COLON 01/08/2007   Chronic back pain 01/08/2007    Immunization History  Administered Date(s) Administered   Influenza Whole 07/26/2007   PPD Test 01/28/2021   Pneumococcal Conjugate-13 02/09/2014   Pneumococcal Polysaccharide-23 04/05/2010, 11/01/2017   Td 10/21/1996, 04/05/2010   Zoster, Live 05/10/2013    Conditions to be addressed/monitored:  {USCCMDZASSESSMENTOPTIONS:23563}  There are no care plans that you recently modified to display for this patient.    Medication Assistance: {MEDASSISTANCEINFO:25044}  Compliance/Adherence/Medication fill history: Care Gaps: ***  Star-Rating Drugs: ***  Patient's preferred pharmacy is:  [facility] - [place], [address] [place] [address] Phone: [phone] Fax: [phone]  Uses pill box? {Yes or If no, why not?:20788} Pt endorses ***% compliance  We discussed: {Pharmacy options:24294} Patient decided to: {US Pharmacy Plan:23885}  Care Plan and Follow Up Patient Decision:  {FOLLOWUP:24991}  Plan: {CM FOLLOW UP VZCH:88502}  ***    Current Barriers:  {pharmacybarriers:24917}  Pharmacist Clinical Goal(s):  Patient will {PHARMACYGOALCHOICES:24921} through  collaboration with PharmD and provider.   Interventions: 1:1 collaboration with Venia Carbon, MD regarding development  and update of comprehensive plan of care as evidenced by provider attestation and co-signature Inter-disciplinary care team collaboration (see longitudinal plan of care) Comprehensive medication review performed; medication list updated in electronic medical record  Diabetes (A1c goal <7%) -Controlled - A1c 5.7% 02/2021 -Current medications: None -Medications previously tried: metformin, glipizide -Current home glucose readings - not sure frequency of home checks fasting glucose: around 140s post prandial glucose: none -No longer on statin - daughter took her off it. She did not want her on a statin.  -Denies hypoglycemic/hyperglycemic symptoms - denies any low BG < 70 -Recommend continue off medications  Hypertension (BP goal <140/90) -Controlled- per clinic readings  -Current treatment: Lisinopril 10 mg - 1 tablet daily -Medications previously tried: none -Current home readings: none reported today -She does have frequent falls but this does not seem to related to her BP at this time.  -Counseled to monitor BP at home with symptoms of dizziness/falls, document, and provide log at future appointments -Recommended to continue current medication  Health Maintenance -Patient is still having frequent falls, seem to be mechanical? Daughter reports she is doing better off metformin (diarrhea) and lower dose Geodon (less groggy, sleeping better) -Gaps - Last DEXA 2021 considered inaccurate. Prior 2017 normal.  -Current therapy:  Vitamin D3 1000 IU - 1 capsule daily -Recommended consider repeat DEXA scan if patient would be interested in treatment. Recommend d/c aspirin due to frequent falls and age > 45. No history of ASCVD.   Patient Goals/Self-Care Activities Patient will:  - {pharmacypatientgoals:24919}

## 2022-05-23 ENCOUNTER — Telehealth: Payer: Self-pay

## 2022-05-23 NOTE — Patient Outreach (Signed)
  Care Coordination   05/23/2022 Name: Diana Roberson MRN: [id number] DOB: Jan 13, 1946   Care Coordination Outreach Attempts:  An unsuccessful telephone outreach was attempted today to offer the patient information about available care coordination services as a benefit of their health plan.   Follow Up Plan:  Additional outreach attempts will be made to offer the patient care coordination information and services.   Encounter Outcome:  No Answer  Care Coordination Interventions Activated:  No   Care Coordination Interventions:  No, not indicated      Antionette Fairy, RN,BSN,CCM [facility] Care Management Telephonic Care Management Coordinator Direct Phone: [phone] Toll Free: [phone] Fax: [phone]

## 2022-05-26 ENCOUNTER — Telehealth: Payer: Self-pay

## 2022-05-26 NOTE — Patient Outreach (Signed)
  Care Coordination   Initial Visit Note   05/26/2022 Name: AUNDRA PUNG MRN: [id number] DOB: 1945-10-30  MAGDA MUISE is a 76 y.o. year old female who sees Karie Schwalbe, MD for primary care. I spoke with  [name] by phone today.  What matters to the patients health and wellness today?  Patient states she no longer sees [facility] MD-followed by new PCP-Dr. Armenia Goli.    Goals Addressed             This Visit's Progress    Care Coordination-no follow up required       Care Coordination Interventions: Provided education to patient re: health maintenance measures-Annual Wellness Visit done 12/30/21 and immunizations          SDOH assessments and interventions completed:  Yes     Care Coordination Interventions Activated:  Yes  Care Coordination Interventions:  Yes, provided   Follow up plan: No further intervention required.   Encounter Outcome:  Pt. Visit Completed   Antionette Fairy, RN,BSN,CCM [facility] Care Management Telephonic Care Management Coordinator Direct Phone: [phone] Toll Free: [phone] Fax: [phone]
